# Patient Record
Sex: Female | Born: 1950 | Race: Black or African American | Hispanic: No | Smoking: Never smoker
Health system: Southern US, Community
[De-identification: ages and names within clinical notes are randomized; demographics above are authoritative.]

## PROBLEM LIST (undated history)

## (undated) ENCOUNTER — Emergency Department (HOSPITAL_COMMUNITY): Payer: 59

## (undated) DIAGNOSIS — T7840XA Allergy, unspecified, initial encounter: Secondary | ICD-10-CM

## (undated) DIAGNOSIS — M1990 Unspecified osteoarthritis, unspecified site: Secondary | ICD-10-CM

## (undated) DIAGNOSIS — E119 Type 2 diabetes mellitus without complications: Secondary | ICD-10-CM

## (undated) DIAGNOSIS — R112 Nausea with vomiting, unspecified: Secondary | ICD-10-CM

## (undated) DIAGNOSIS — I10 Essential (primary) hypertension: Secondary | ICD-10-CM

## (undated) DIAGNOSIS — E785 Hyperlipidemia, unspecified: Secondary | ICD-10-CM

## (undated) DIAGNOSIS — Z9889 Other specified postprocedural states: Secondary | ICD-10-CM

## (undated) HISTORY — PX: MULTIPLE TOOTH EXTRACTIONS: SHX2053

## (undated) HISTORY — DX: Allergy, unspecified, initial encounter: T78.40XA

## (undated) HISTORY — PX: BACK SURGERY: SHX140

## (undated) HISTORY — DX: Nausea with vomiting, unspecified: R11.2

## (undated) HISTORY — DX: Other specified postprocedural states: Z98.890

## (undated) HISTORY — DX: Hyperlipidemia, unspecified: E78.5

## (undated) HISTORY — DX: Type 2 diabetes mellitus without complications: E11.9

---

## 2018-12-16 ENCOUNTER — Telehealth (HOSPITAL_COMMUNITY): Payer: Self-pay | Admitting: *Deleted

## 2018-12-16 NOTE — Telephone Encounter (Signed)
12/16/18 left msg asking pt to return my call to schedule appt order from Dr. Weldon Inches

## 2018-12-20 ENCOUNTER — Other Ambulatory Visit: Payer: Self-pay | Admitting: Internal Medicine

## 2018-12-20 DIAGNOSIS — Z1231 Encounter for screening mammogram for malignant neoplasm of breast: Secondary | ICD-10-CM

## 2018-12-22 ENCOUNTER — Other Ambulatory Visit (HOSPITAL_COMMUNITY): Payer: Self-pay | Admitting: Internal Medicine

## 2018-12-22 ENCOUNTER — Encounter: Payer: Self-pay | Admitting: Family

## 2018-12-22 ENCOUNTER — Ambulatory Visit (HOSPITAL_COMMUNITY): Admission: RE | Admit: 2018-12-22 | Payer: Medicare Other | Source: Ambulatory Visit

## 2018-12-22 DIAGNOSIS — I739 Peripheral vascular disease, unspecified: Secondary | ICD-10-CM

## 2018-12-26 ENCOUNTER — Ambulatory Visit (HOSPITAL_COMMUNITY)
Admission: RE | Admit: 2018-12-26 | Discharge: 2018-12-26 | Disposition: A | Discharge status: Home / Self Care | Payer: Medicare Other | Source: Ambulatory Visit | Attending: Internal Medicine | Admitting: Internal Medicine

## 2018-12-26 DIAGNOSIS — I739 Peripheral vascular disease, unspecified: Secondary | ICD-10-CM | POA: Diagnosis not present

## 2019-01-04 ENCOUNTER — Other Ambulatory Visit: Payer: Self-pay | Admitting: Orthopedic Surgery

## 2019-01-04 DIAGNOSIS — M545 Low back pain, unspecified: Secondary | ICD-10-CM

## 2019-01-13 ENCOUNTER — Ambulatory Visit
Admission: RE | Admit: 2019-01-13 | Discharge: 2019-01-13 | Disposition: A | Discharge status: Home / Self Care | Payer: Medicare Other | Source: Ambulatory Visit | Attending: Orthopedic Surgery | Admitting: Orthopedic Surgery

## 2019-01-13 DIAGNOSIS — M545 Low back pain, unspecified: Secondary | ICD-10-CM

## 2019-01-16 ENCOUNTER — Other Ambulatory Visit: Payer: Medicare Other

## 2019-01-30 ENCOUNTER — Other Ambulatory Visit: Payer: Self-pay | Admitting: Neurosurgery

## 2019-01-30 DIAGNOSIS — M4310 Spondylolisthesis, site unspecified: Secondary | ICD-10-CM

## 2019-02-02 ENCOUNTER — Ambulatory Visit
Admission: RE | Admit: 2019-02-02 | Discharge: 2019-02-02 | Disposition: A | Discharge status: Home / Self Care | Payer: Medicare Other | Source: Ambulatory Visit | Attending: Neurosurgery | Admitting: Neurosurgery

## 2019-02-02 DIAGNOSIS — M4310 Spondylolisthesis, site unspecified: Secondary | ICD-10-CM

## 2019-03-16 ENCOUNTER — Other Ambulatory Visit: Payer: Self-pay | Admitting: Internal Medicine

## 2019-03-16 DIAGNOSIS — E2839 Other primary ovarian failure: Secondary | ICD-10-CM

## 2019-05-25 ENCOUNTER — Other Ambulatory Visit: Payer: Self-pay | Admitting: Neurosurgery

## 2019-06-01 ENCOUNTER — Ambulatory Visit
Admission: RE | Admit: 2019-06-01 | Discharge: 2019-06-01 | Disposition: A | Discharge status: Home / Self Care | Payer: Medicare Other | Source: Ambulatory Visit | Attending: Internal Medicine | Admitting: Internal Medicine

## 2019-06-01 ENCOUNTER — Other Ambulatory Visit: Payer: Self-pay

## 2019-06-01 DIAGNOSIS — E2839 Other primary ovarian failure: Secondary | ICD-10-CM

## 2019-06-07 ENCOUNTER — Other Ambulatory Visit: Payer: Self-pay

## 2019-06-07 ENCOUNTER — Encounter (HOSPITAL_COMMUNITY)
Admission: RE | Admit: 2019-06-07 | Discharge: 2019-06-07 | Disposition: A | Discharge status: Home / Self Care | Payer: Medicare Other | Source: Ambulatory Visit | Attending: Neurosurgery | Admitting: Neurosurgery

## 2019-06-07 ENCOUNTER — Encounter (HOSPITAL_COMMUNITY): Payer: Self-pay

## 2019-06-07 DIAGNOSIS — M48061 Spinal stenosis, lumbar region without neurogenic claudication: Secondary | ICD-10-CM | POA: Diagnosis not present

## 2019-06-07 DIAGNOSIS — I10 Essential (primary) hypertension: Secondary | ICD-10-CM | POA: Diagnosis not present

## 2019-06-07 DIAGNOSIS — E669 Obesity, unspecified: Secondary | ICD-10-CM | POA: Diagnosis not present

## 2019-06-07 DIAGNOSIS — Z6833 Body mass index (BMI) 33.0-33.9, adult: Secondary | ICD-10-CM | POA: Diagnosis not present

## 2019-06-07 DIAGNOSIS — Z7951 Long term (current) use of inhaled steroids: Secondary | ICD-10-CM | POA: Insufficient documentation

## 2019-06-07 DIAGNOSIS — Z01818 Encounter for other preprocedural examination: Secondary | ICD-10-CM | POA: Insufficient documentation

## 2019-06-07 DIAGNOSIS — M4316 Spondylolisthesis, lumbar region: Secondary | ICD-10-CM | POA: Diagnosis not present

## 2019-06-07 DIAGNOSIS — Z79899 Other long term (current) drug therapy: Secondary | ICD-10-CM | POA: Insufficient documentation

## 2019-06-07 DIAGNOSIS — Z1159 Encounter for screening for other viral diseases: Secondary | ICD-10-CM | POA: Insufficient documentation

## 2019-06-07 DIAGNOSIS — Z791 Long term (current) use of non-steroidal anti-inflammatories (NSAID): Secondary | ICD-10-CM | POA: Insufficient documentation

## 2019-06-07 DIAGNOSIS — R9431 Abnormal electrocardiogram [ECG] [EKG]: Secondary | ICD-10-CM | POA: Insufficient documentation

## 2019-06-07 HISTORY — DX: Unspecified osteoarthritis, unspecified site: M19.90

## 2019-06-07 HISTORY — DX: Essential (primary) hypertension: I10

## 2019-06-07 LAB — CBC WITH DIFFERENTIAL/PLATELET
Abs Immature Granulocytes: 0.01 10*3/uL (ref 0.00–0.07)
Basophils Absolute: 0 10*3/uL (ref 0.0–0.1)
Basophils Relative: 0 %
Eosinophils Absolute: 0.2 10*3/uL (ref 0.0–0.5)
Eosinophils Relative: 3 %
HCT: 37.5 % (ref 36.0–46.0)
Hemoglobin: 11.6 g/dL — ABNORMAL LOW (ref 12.0–15.0)
Immature Granulocytes: 0 %
Lymphocytes Relative: 51 %
Lymphs Abs: 2.8 10*3/uL (ref 0.7–4.0)
MCH: 23.5 pg — ABNORMAL LOW (ref 26.0–34.0)
MCHC: 30.9 g/dL (ref 30.0–36.0)
MCV: 75.9 fL — ABNORMAL LOW (ref 80.0–100.0)
Monocytes Absolute: 0.4 10*3/uL (ref 0.1–1.0)
Monocytes Relative: 8 %
Neutro Abs: 2.1 10*3/uL (ref 1.7–7.7)
Neutrophils Relative %: 38 %
Platelets: 308 10*3/uL (ref 150–400)
RBC: 4.94 MIL/uL (ref 3.87–5.11)
RDW: 15 % (ref 11.5–15.5)
WBC: 5.5 10*3/uL (ref 4.0–10.5)
nRBC: 0 % (ref 0.0–0.2)

## 2019-06-07 LAB — BASIC METABOLIC PANEL
Anion gap: 8 (ref 5–15)
BUN: 11 mg/dL (ref 8–23)
CO2: 26 mmol/L (ref 22–32)
Calcium: 9.1 mg/dL (ref 8.9–10.3)
Chloride: 108 mmol/L (ref 98–111)
Creatinine, Ser: 0.62 mg/dL (ref 0.44–1.00)
GFR calc Af Amer: >60 mL/min (ref >60)
GFR calc non Af Amer: >60 mL/min (ref >60)
Glucose, Bld: 136 mg/dL — ABNORMAL HIGH (ref 70–99)
Potassium: 3.8 mmol/L (ref 3.5–5.1)
Sodium: 142 mmol/L (ref 135–145)

## 2019-06-07 LAB — TYPE AND SCREEN
ABO/RH(D): O POS
Antibody Screen: NEGATIVE

## 2019-06-07 LAB — SURGICAL PCR SCREEN
MRSA, PCR: NEGATIVE
Staphylococcus aureus: POSITIVE — AB

## 2019-06-07 LAB — ABO/RH: ABO/RH(D): O POS

## 2019-06-07 NOTE — Progress Notes (Signed)
PCP - Dr. Kelby Fam - [facility] Cardiologist - denies  Chest x-ray - not needed EKG - 06/07/19 Stress Test - > 10 years ECHO - > 10 years Cardiac Cath - denies  Anesthesia review: records requested  Patient denies shortness of breath, fever, cough and chest pain at PAT appointment   Patient verbalized understanding of instructions that were given to them at the PAT appointment. Patient was also instructed that they will need to review over the PAT instructions again at home before surgery.

## 2019-06-07 NOTE — Progress Notes (Addendum)
Patient's Surgical PCR was positive for Staph.  Called in Mupirocin to [place], spoke with Merrilee Seashore.  Called patient, informed her of results and rx being called in.  Use twice day, nasally for five days.  Patient verbalized understanding.

## 2019-06-07 NOTE — Progress Notes (Signed)
[facility] [phone] - [place], [place] - [address] AT [address] [place] [address] Phone: [phone] Fax: [phone]      Your procedure is scheduled on June 29  Report to [facility] Main Entrance "A" at 0600 A.M., and check in at the Admitting office.  Call this number if you have problems the morning of surgery:  [phone]  Call [phone] if you have any questions prior to your surgery date Monday-Friday 8am-4pm    Remember:  Do not eat or drink after midnight.   Take these medicines the morning of surgery with A SIP OF WATER  amLODipine (NORVASC)  cloNIDine (CATAPRES) fluticasone (FLONASE) HYDROcodone-acetaminophen (NORCO/VICODIN) if needed for pain lovastatin (MEVACOR) methocarbamol (ROBAXIN)   7 days prior to surgery STOP taking any diclofenac (VOLTAREN), Aspirin (unless otherwise instructed by your surgeon), Aleve, Naproxen, Ibuprofen, Motrin, Advil, Goody's, BC's, all herbal medications, fish oil, and all vitamins.    The Morning of Surgery  Do not wear jewelry, make-up or nail polish.  Do not wear lotions, powders, or perfumes/colognes, or deodorant  Do not shave 48 hours prior to surgery.  Men may shave face and neck.  Do not bring valuables to the hospital.  [facility] is not responsible for any belongings or valuables.  If you are a smoker, DO NOT Smoke 24 hours prior to surgery IF you wear a CPAP at night please bring your mask, tubing, and machine the morning of surgery   Remember that you must have someone to transport you home after your surgery, and remain with you for 24 hours if you are discharged the same day.   Contacts, glasses, hearing aids, dentures or bridgework may not be worn into surgery.    Leave your suitcase in the car.  After surgery it may be brought to your room.  For patients admitted to the hospital, discharge time will be determined by your treatment  team.  Patients discharged the day of surgery will not be allowed to drive home.    Special instructions:   [facility]- Preparing For Surgery  Before surgery, you can play an important role. Because skin is not sterile, your skin needs to be as free of germs as possible. You can reduce the number of germs on your skin by washing with CHG (chlorahexidine gluconate) Soap before surgery.  CHG is an antiseptic cleaner which kills germs and bonds with the skin to continue killing germs even after washing.    Oral Hygiene is also important to reduce your risk of infection.  Remember - BRUSH YOUR TEETH THE MORNING OF SURGERY WITH YOUR REGULAR TOOTHPASTE  Please do not use if you have an allergy to CHG or antibacterial soaps. If your skin becomes reddened/irritated stop using the CHG.  Do not shave (including legs and underarms) for at least 48 hours prior to first CHG shower. It is OK to shave your face.  Please follow these instructions carefully.   1. Shower the NIGHT BEFORE SURGERY and the MORNING OF SURGERY with CHG Soap.   2. If you chose to wash your hair, wash your hair first as usual with your normal shampoo.  3. After you shampoo, rinse your hair and body thoroughly to remove the shampoo.  4. Use CHG as you would any other liquid soap. You can apply CHG directly to the skin and wash gently with a scrungie or a clean washcloth.   5. Apply the CHG Soap to  your body ONLY FROM THE NECK DOWN.  Do not use on open wounds or open sores. Avoid contact with your eyes, ears, mouth and genitals (private parts). Wash Face and genitals (private parts)  with your normal soap.   6. Wash thoroughly, paying special attention to the area where your surgery will be performed.  7. Thoroughly rinse your body with warm water from the neck down.  8. DO NOT shower/wash with your normal soap after using and rinsing off the CHG Soap.  9. Pat yourself dry with a CLEAN TOWEL.  10. Wear CLEAN PAJAMAS to bed  the night before surgery, wear comfortable clothes the morning of surgery  11. Place CLEAN SHEETS on your bed the night of your first shower and DO NOT SLEEP WITH PETS.    Day of Surgery:  Do not apply any deodorants/lotions.  Please wear clean clothes to the hospital/surgery center.   Remember to brush your teeth WITH YOUR REGULAR TOOTHPASTE.   Please read over the following fact sheets that you were given.

## 2019-06-08 ENCOUNTER — Other Ambulatory Visit (HOSPITAL_COMMUNITY)
Admission: RE | Admit: 2019-06-08 | Discharge: 2019-06-08 | Disposition: A | Discharge status: Home / Self Care | Payer: Medicare Other | Source: Ambulatory Visit | Attending: Neurosurgery | Admitting: Neurosurgery

## 2019-06-08 DIAGNOSIS — Z01818 Encounter for other preprocedural examination: Secondary | ICD-10-CM | POA: Diagnosis not present

## 2019-06-08 LAB — SARS CORONAVIRUS 2 (TAT 6-24 HRS): SARS Coronavirus 2: NEGATIVE

## 2019-06-08 NOTE — Progress Notes (Addendum)
Anesthesia Chart Review:  Case: [id number] Date/Time: 06/12/19 0745   Procedure: PLIF - L2-L3 - L3-L4 - Posterior Lateral and Interbody fusion (N/A Back)   Anesthesia type: General   Pre-op diagnosis: Spondylolisthesis   Location: [facility] OR ROOM 20 / [facility] OR   Surgeon: Julio SicksPool, Henry, MD      DISCUSSION: Patient is a 68 year old female scheduled for the above procedure.  History includes never smoker, HTN, arthritis, back surgery (L4-5 fusion). BMI is consistent with obesity.  No reported CAD or DM history. Non-smoker. She denied SOB, chest pain, cough, fever at her PAT RN visit. Latest records requested from her PCP office [employer]([facility]). If pertinent records received prior to surgery then I will plan to update my note, but based on currently available information, I would anticipate that she can proceed as planned if no acute changes.   Presurgical COVID test is scheduled for 06/08/19. (UPDATE 06/09/19 9:30 AM: COVID test negative. No prior EKG at [facility]. Seen by Dr. Concepcion ElkAvbuere on 03/16/19 and was waiting on surgery date at that time.)   VS: BP 137/62   Pulse 83   Temp (!) 36.3 C   Resp 20   Ht 5\' 2"  (1.575 m)   Wt 83.6 kg   SpO2 100%   BMI 33.71 kg/m   PROVIDERS: Mayra NeerGoode, Pandora, PA-C is PCP with Fleet ContrasAvbuere, Edwin, MD at [facility].   LABS: Labs reviewed: Acceptable for surgery. (all labs ordered are listed, but only abnormal results are displayed)  Labs Reviewed  SURGICAL PCR SCREEN - Abnormal; Notable for the following components:      Result Value   Staphylococcus aureus POSITIVE (*)    All other components within normal limits  CBC WITH DIFFERENTIAL/PLATELET - Abnormal; Notable for the following components:   Hemoglobin 11.6 (*)    MCV 75.9 (*)    MCH 23.5 (*)    All other components within normal limits  BASIC METABOLIC PANEL - Abnormal; Notable for the following components:   Glucose, Bld 136 (*)    All other components within normal limits  TYPE AND  SCREEN  ABO/RH    IMAGES: CT L-spine 02/02/19: IMPRESSION: 1. L4-5 PLIF, posterior element fusion, and laminectomy without hardware complication. 2. Stable lumbar spondylosis given differences in technique greatest at the L3-4 level with there is adjacent segment disease resulting in severe spinal canal stenosis.  MRI L-spine 01/13/19: IMPRESSION: 1. Prior posterior decompression with fusion at L4-5 without residual stenosis. 2. Adjacent segment multifactorial disease at L3-4 with resultant severe canal with moderate left greater than right L3 foraminal stenosis. 3. Disc bulging with superimposed broad-based left subarticular/foraminal disc protrusion and facet hypertrophy at L2-3, resulting in moderate canal with severe left lateral recess stenosis and moderate left foraminal narrowing. Either the left L2 or descending L3 nerve roots could be affected. 4. Right eccentric disc osteophyte at L5-S1 with resultant mild to moderate right L5 foraminal stenosis.   EKG: 06/07/19: Normal sinus rhythm Septal infarct , age undetermined Abnormal ECG No previous tracing Confirmed by Nanetta BattyBerry, Jonathan [phone]([id number]) on 06/07/2019 5:16:47 PM - Requested prior EKG from [facility] if available, but records are pending. Currently no comparison tracing available.   CV: She reported a stress and echo > 10 years ago.   She has BLE ABIs within the normal range on 12/16/18.   Past Medical History:  Diagnosis Date  . Arthritis   . Hypertension     Past Surgical History:  Procedure Laterality Date  .  ABDOMINAL HYSTERECTOMY    . BACK SURGERY     lower back  . MULTIPLE TOOTH EXTRACTIONS      MEDICATIONS: . amLODipine (NORVASC) 10 MG tablet  . Calcium Carb-Cholecalciferol (CALCIUM 600/VITAMIN D3 PO)  . cloNIDine (CATAPRES) 0.1 MG tablet  . diclofenac (VOLTAREN) 75 MG EC tablet  . fluticasone (FLONASE) 50 MCG/ACT nasal spray  . furosemide (LASIX) 20 MG tablet  . HYDROcodone-acetaminophen  (NORCO/VICODIN) 5-325 MG tablet  . losartan (COZAAR) 100 MG tablet  . lovastatin (MEVACOR) 40 MG tablet  . methocarbamol (ROBAXIN) 750 MG tablet  . potassium chloride (K-DUR) 10 MEQ tablet   No current facility-administered medications for this encounter.     Myra Gianotti, PA-C Surgical Short Stay/Anesthesiology [facility] Phone [phone] [facility] Phone [phone] 06/08/2019 11:58 AM

## 2019-06-11 NOTE — Anesthesia Preprocedure Evaluation (Addendum)
Anesthesia Evaluation  Patient identified by MRN, date of birth, ID band Patient awake    Reviewed: Allergy & Precautions, H&P , NPO status , Patient's Chart, lab work & pertinent test results  Airway Mallampati: II  TM Distance: >3 FB Neck ROM: Full    Dental no notable dental hx. (+) Dental Advisory Given, Edentulous Upper, Edentulous Lower   Pulmonary neg pulmonary ROS,    Pulmonary exam normal breath sounds clear to auscultation       Cardiovascular Exercise Tolerance: Good hypertension, Pt. on medications  Rhythm:Regular Rate:Normal     Neuro/Psych negative neurological ROS  negative psych ROS   GI/Hepatic negative GI ROS, Neg liver ROS,   Endo/Other  negative endocrine ROS  Renal/GU negative Renal ROS  negative genitourinary   Musculoskeletal  (+) Arthritis , Osteoarthritis,    Abdominal   Peds  Hematology negative hematology ROS (+)   Anesthesia Other Findings   Reproductive/Obstetrics negative OB ROS                            Anesthesia Physical Anesthesia Plan  ASA: II  Anesthesia Plan: General   Post-op Pain Management:    Induction: Intravenous  PONV Risk Score and Plan: 4 or greater and Ondansetron, Dexamethasone and Midazolam  Airway Management Planned: Oral ETT  Additional Equipment:   Intra-op Plan:   Post-operative Plan: Extubation in OR  Informed Consent: I have reviewed the patients History and Physical, chart, labs and discussed the procedure including the risks, benefits and alternatives for the proposed anesthesia with the patient or authorized representative who has indicated his/her understanding and acceptance.     Dental advisory given  Plan Discussed with: CRNA  Anesthesia Plan Comments:        Anesthesia Quick Evaluation

## 2019-06-12 ENCOUNTER — Inpatient Hospital Stay (HOSPITAL_COMMUNITY): Payer: Medicare Other

## 2019-06-12 ENCOUNTER — Inpatient Hospital Stay (HOSPITAL_COMMUNITY)
Admission: RE | Disposition: A | Discharge status: Skilled Nursing Facility | Payer: Self-pay | Source: Home / Self Care | Attending: Neurosurgery

## 2019-06-12 ENCOUNTER — Inpatient Hospital Stay (HOSPITAL_COMMUNITY): Payer: Medicare Other | Admitting: Vascular Surgery

## 2019-06-12 ENCOUNTER — Inpatient Hospital Stay (HOSPITAL_COMMUNITY): Payer: Medicare Other | Admitting: Certified Registered"

## 2019-06-12 ENCOUNTER — Encounter (HOSPITAL_COMMUNITY): Payer: Self-pay | Admitting: Certified Registered"

## 2019-06-12 ENCOUNTER — Other Ambulatory Visit: Payer: Self-pay

## 2019-06-12 ENCOUNTER — Inpatient Hospital Stay (HOSPITAL_COMMUNITY)
Admission: RE | Admit: 2019-06-12 | Discharge: 2019-06-20 | DRG: 454 | Disposition: A | Discharge status: Skilled Nursing Facility | Payer: Medicare Other | Attending: Neurosurgery | Admitting: Neurosurgery

## 2019-06-12 DIAGNOSIS — Z886 Allergy status to analgesic agent status: Secondary | ICD-10-CM | POA: Diagnosis not present

## 2019-06-12 DIAGNOSIS — Z419 Encounter for procedure for purposes other than remedying health state, unspecified: Secondary | ICD-10-CM

## 2019-06-12 DIAGNOSIS — Z1159 Encounter for screening for other viral diseases: Secondary | ICD-10-CM

## 2019-06-12 DIAGNOSIS — G992 Myelopathy in diseases classified elsewhere: Secondary | ICD-10-CM | POA: Diagnosis not present

## 2019-06-12 DIAGNOSIS — M4316 Spondylolisthesis, lumbar region: Secondary | ICD-10-CM | POA: Diagnosis not present

## 2019-06-12 DIAGNOSIS — Z79899 Other long term (current) drug therapy: Secondary | ICD-10-CM | POA: Diagnosis not present

## 2019-06-12 DIAGNOSIS — M4310 Spondylolisthesis, site unspecified: Secondary | ICD-10-CM | POA: Diagnosis present

## 2019-06-12 DIAGNOSIS — I10 Essential (primary) hypertension: Secondary | ICD-10-CM | POA: Diagnosis present

## 2019-06-12 DIAGNOSIS — Z880 Allergy status to penicillin: Secondary | ICD-10-CM

## 2019-06-12 DIAGNOSIS — M5416 Radiculopathy, lumbar region: Secondary | ICD-10-CM | POA: Diagnosis present

## 2019-06-12 DIAGNOSIS — Z720 Tobacco use: Secondary | ICD-10-CM | POA: Diagnosis not present

## 2019-06-12 DIAGNOSIS — M48062 Spinal stenosis, lumbar region with neurogenic claudication: Secondary | ICD-10-CM | POA: Diagnosis present

## 2019-06-12 SURGERY — POSTERIOR LUMBAR FUSION 2 LEVEL
Anesthesia: General | Site: Back

## 2019-06-12 MED ORDER — THROMBIN 20000 UNITS EX SOLR
CUTANEOUS | Status: AC
  Filled 2019-06-12: qty 20000

## 2019-06-12 MED ORDER — SUCCINYLCHOLINE CHLORIDE 200 MG/10ML IV SOSY
PREFILLED_SYRINGE | INTRAVENOUS | Status: AC
  Filled 2019-06-12: qty 10

## 2019-06-12 MED ORDER — SUGAMMADEX SODIUM 200 MG/2ML IV SOLN
INTRAVENOUS | Status: DC | PRN
  Administered 2019-06-12: 200 mg via INTRAVENOUS

## 2019-06-12 MED ORDER — FLUTICASONE PROPIONATE 50 MCG/ACT NA SUSP
1.0000 | Freq: Every day | NASAL | Status: DC | PRN
  Filled 2019-06-12: qty 16

## 2019-06-12 MED ORDER — HYDROCODONE-ACETAMINOPHEN 10-325 MG PO TABS
ORAL_TABLET | ORAL | Status: AC
  Filled 2019-06-12: qty 1

## 2019-06-12 MED ORDER — SODIUM CHLORIDE 0.9% FLUSH
3.0000 mL | Freq: Two times a day (BID) | INTRAVENOUS | Status: DC
  Administered 2019-06-13 – 2019-06-18 (×9): 3 mL via INTRAVENOUS

## 2019-06-12 MED ORDER — SUCCINYLCHOLINE CHLORIDE 200 MG/10ML IV SOSY
PREFILLED_SYRINGE | INTRAVENOUS | Status: DC | PRN
  Administered 2019-06-12: 100 mg via INTRAVENOUS

## 2019-06-12 MED ORDER — MUPIROCIN 2 % EX OINT
1.0000 "application " | TOPICAL_OINTMENT | Freq: Two times a day (BID) | CUTANEOUS | Status: AC
  Administered 2019-06-12 – 2019-06-17 (×9): 1 via NASAL
  Filled 2019-06-12: qty 22

## 2019-06-12 MED ORDER — DIAZEPAM 5 MG PO TABS
5.0000 mg | ORAL_TABLET | Freq: Four times a day (QID) | ORAL | Status: DC | PRN
  Administered 2019-06-12: 5 mg via ORAL

## 2019-06-12 MED ORDER — LIDOCAINE 2% (20 MG/ML) 5 ML SYRINGE
INTRAMUSCULAR | Status: DC | PRN
  Administered 2019-06-12: 100 mg via INTRAVENOUS

## 2019-06-12 MED ORDER — SODIUM CHLORIDE 0.9 % IV SOLN
INTRAVENOUS | Status: DC | PRN
  Administered 2019-06-12: 60 ug/min via INTRAVENOUS

## 2019-06-12 MED ORDER — ACETAMINOPHEN 650 MG RE SUPP: 650.0000 mg | RECTAL | Status: DC | PRN

## 2019-06-12 MED ORDER — DEXAMETHASONE SODIUM PHOSPHATE 10 MG/ML IJ SOLN
INTRAMUSCULAR | Status: AC
  Filled 2019-06-12: qty 1

## 2019-06-12 MED ORDER — DEXAMETHASONE SODIUM PHOSPHATE 10 MG/ML IJ SOLN
10.0000 mg | INTRAMUSCULAR | Status: DC
  Filled 2019-06-12: qty 1

## 2019-06-12 MED ORDER — MIDAZOLAM HCL 2 MG/2ML IJ SOLN
INTRAMUSCULAR | Status: AC
  Filled 2019-06-12: qty 2

## 2019-06-12 MED ORDER — DIAZEPAM 5 MG PO TABS
ORAL_TABLET | ORAL | Status: AC
  Filled 2019-06-12: qty 1

## 2019-06-12 MED ORDER — DIAZEPAM 5 MG PO TABS
5.0000 mg | ORAL_TABLET | Freq: Four times a day (QID) | ORAL | Status: DC | PRN
  Administered 2019-06-12: 10 mg via ORAL
  Administered 2019-06-13 – 2019-06-14 (×3): 5 mg via ORAL
  Administered 2019-06-17 – 2019-06-18 (×2): 10 mg via ORAL
  Administered 2019-06-18 – 2019-06-20 (×3): 5 mg via ORAL
  Filled 2019-06-12 (×4): qty 1
  Filled 2019-06-12: qty 2
  Filled 2019-06-12 (×2): qty 1
  Filled 2019-06-12: qty 2
  Filled 2019-06-12: qty 1
  Filled 2019-06-12: qty 2

## 2019-06-12 MED ORDER — HYDROMORPHONE HCL 1 MG/ML IJ SOLN
0.2500 mg | INTRAMUSCULAR | Status: DC | PRN
  Administered 2019-06-12 (×4): 0.5 mg via INTRAVENOUS

## 2019-06-12 MED ORDER — CHLORHEXIDINE GLUCONATE CLOTH 2 % EX PADS: 6.0000 | MEDICATED_PAD | Freq: Once | CUTANEOUS | Status: DC

## 2019-06-12 MED ORDER — VANCOMYCIN HCL IN DEXTROSE 1-5 GM/200ML-% IV SOLN
1000.0000 mg | Freq: Once | INTRAVENOUS | Status: AC
  Administered 2019-06-12: 1000 mg via INTRAVENOUS
  Filled 2019-06-12: qty 200

## 2019-06-12 MED ORDER — ROCURONIUM BROMIDE 50 MG/5ML IV SOSY
PREFILLED_SYRINGE | INTRAVENOUS | Status: DC | PRN
  Administered 2019-06-12: 20 mg via INTRAVENOUS
  Administered 2019-06-12: 50 mg via INTRAVENOUS
  Administered 2019-06-12 (×3): 20 mg via INTRAVENOUS

## 2019-06-12 MED ORDER — POTASSIUM CHLORIDE CRYS ER 20 MEQ PO TBCR
20.0000 meq | EXTENDED_RELEASE_TABLET | Freq: Every day | ORAL | Status: DC
  Administered 2019-06-13 – 2019-06-20 (×8): 20 meq via ORAL
  Filled 2019-06-12 (×8): qty 1

## 2019-06-12 MED ORDER — BUPIVACAINE HCL (PF) 0.25 % IJ SOLN
INTRAMUSCULAR | Status: DC | PRN
  Administered 2019-06-12: 20 mL

## 2019-06-12 MED ORDER — GLYCOPYRROLATE PF 0.2 MG/ML IJ SOSY
PREFILLED_SYRINGE | INTRAMUSCULAR | Status: DC | PRN
  Administered 2019-06-12: .1 mg via INTRAVENOUS

## 2019-06-12 MED ORDER — HYDROCODONE-ACETAMINOPHEN 10-325 MG PO TABS
1.0000 | ORAL_TABLET | ORAL | Status: DC | PRN
  Administered 2019-06-12: 1 via ORAL

## 2019-06-12 MED ORDER — ALBUMIN HUMAN 5 % IV SOLN
INTRAVENOUS | Status: DC | PRN
  Administered 2019-06-12 (×2): via INTRAVENOUS

## 2019-06-12 MED ORDER — THROMBIN 20000 UNITS EX SOLR
CUTANEOUS | Status: DC | PRN
  Administered 2019-06-12 (×2): 20 mL via TOPICAL

## 2019-06-12 MED ORDER — MIDAZOLAM HCL 5 MG/5ML IJ SOLN
INTRAMUSCULAR | Status: DC | PRN
  Administered 2019-06-12: 2 mg via INTRAVENOUS

## 2019-06-12 MED ORDER — ONDANSETRON HCL 4 MG/2ML IJ SOLN
INTRAMUSCULAR | Status: AC
  Filled 2019-06-12: qty 2

## 2019-06-12 MED ORDER — OXYCODONE HCL 5 MG PO TABS: 10.0000 mg | ORAL_TABLET | ORAL | Status: DC | PRN

## 2019-06-12 MED ORDER — PHENOL 1.4 % MT LIQD: 1.0000 | OROMUCOSAL | Status: DC | PRN

## 2019-06-12 MED ORDER — BUPIVACAINE HCL (PF) 0.25 % IJ SOLN
INTRAMUSCULAR | Status: AC
  Filled 2019-06-12: qty 30

## 2019-06-12 MED ORDER — SODIUM CHLORIDE 0.9 % IV SOLN
INTRAVENOUS | Status: DC | PRN
  Administered 2019-06-12: 12:00:00 via INTRAVENOUS

## 2019-06-12 MED ORDER — AMLODIPINE BESYLATE 10 MG PO TABS
10.0000 mg | ORAL_TABLET | Freq: Every day | ORAL | Status: DC
  Administered 2019-06-13 – 2019-06-20 (×8): 10 mg via ORAL
  Filled 2019-06-12: qty 2
  Filled 2019-06-12 (×6): qty 1
  Filled 2019-06-12: qty 2

## 2019-06-12 MED ORDER — PRAVASTATIN SODIUM 40 MG PO TABS
40.0000 mg | ORAL_TABLET | Freq: Every day | ORAL | Status: DC
  Administered 2019-06-12 – 2019-06-19 (×8): 40 mg via ORAL
  Filled 2019-06-12 (×8): qty 1

## 2019-06-12 MED ORDER — HYDROCODONE-ACETAMINOPHEN 10-325 MG PO TABS
1.0000 | ORAL_TABLET | ORAL | Status: DC | PRN
  Administered 2019-06-12 – 2019-06-20 (×12): 1 via ORAL
  Filled 2019-06-12 (×12): qty 1

## 2019-06-12 MED ORDER — ONDANSETRON HCL 4 MG PO TABS: 4.0000 mg | ORAL_TABLET | Freq: Four times a day (QID) | ORAL | Status: DC | PRN

## 2019-06-12 MED ORDER — LACTATED RINGERS IV SOLN
INTRAVENOUS | Status: DC | PRN
  Administered 2019-06-12 (×2): via INTRAVENOUS

## 2019-06-12 MED ORDER — VANCOMYCIN HCL IN DEXTROSE 1-5 GM/200ML-% IV SOLN
1000.0000 mg | Freq: Once | INTRAVENOUS | Status: AC
  Administered 2019-06-12: 1000 mg via INTRAVENOUS

## 2019-06-12 MED ORDER — 0.9 % SODIUM CHLORIDE (POUR BTL) OPTIME
TOPICAL | Status: DC | PRN
  Administered 2019-06-12: 1000 mL

## 2019-06-12 MED ORDER — GLYCOPYRROLATE PF 0.2 MG/ML IJ SOSY
PREFILLED_SYRINGE | INTRAMUSCULAR | Status: AC
  Filled 2019-06-12: qty 1

## 2019-06-12 MED ORDER — POLYETHYLENE GLYCOL 3350 17 G PO PACK
17.0000 g | PACK | Freq: Every day | ORAL | Status: DC | PRN
  Administered 2019-06-15 – 2019-06-19 (×3): 17 g via ORAL
  Filled 2019-06-12 (×3): qty 1

## 2019-06-12 MED ORDER — ROCURONIUM BROMIDE 10 MG/ML (PF) SYRINGE
PREFILLED_SYRINGE | INTRAVENOUS | Status: AC
  Filled 2019-06-12: qty 10

## 2019-06-12 MED ORDER — MENTHOL 3 MG MT LOZG: 1.0000 | LOZENGE | OROMUCOSAL | Status: DC | PRN

## 2019-06-12 MED ORDER — LIDOCAINE 2% (20 MG/ML) 5 ML SYRINGE
INTRAMUSCULAR | Status: AC
  Filled 2019-06-12: qty 5

## 2019-06-12 MED ORDER — FENTANYL CITRATE (PF) 250 MCG/5ML IJ SOLN
INTRAMUSCULAR | Status: AC
  Filled 2019-06-12: qty 5

## 2019-06-12 MED ORDER — PHENYLEPHRINE 40 MCG/ML (10ML) SYRINGE FOR IV PUSH (FOR BLOOD PRESSURE SUPPORT)
PREFILLED_SYRINGE | INTRAVENOUS | Status: DC | PRN
  Administered 2019-06-12 (×2): 80 ug via INTRAVENOUS

## 2019-06-12 MED ORDER — HYDROMORPHONE HCL 1 MG/ML IJ SOLN
INTRAMUSCULAR | Status: AC
  Filled 2019-06-12: qty 1

## 2019-06-12 MED ORDER — HYDROXYZINE HCL 50 MG/ML IM SOLN
50.0000 mg | Freq: Four times a day (QID) | INTRAMUSCULAR | Status: DC | PRN
  Administered 2019-06-13: 50 mg via INTRAMUSCULAR
  Filled 2019-06-12 (×2): qty 1

## 2019-06-12 MED ORDER — ONDANSETRON HCL 4 MG/2ML IJ SOLN
4.0000 mg | Freq: Four times a day (QID) | INTRAMUSCULAR | Status: DC | PRN
  Administered 2019-06-12 – 2019-06-13 (×2): 4 mg via INTRAVENOUS
  Filled 2019-06-12 (×2): qty 2

## 2019-06-12 MED ORDER — CLONIDINE HCL 0.1 MG PO TABS
0.1000 mg | ORAL_TABLET | Freq: Two times a day (BID) | ORAL | Status: DC
  Administered 2019-06-12 – 2019-06-20 (×16): 0.1 mg via ORAL
  Filled 2019-06-12 (×16): qty 1

## 2019-06-12 MED ORDER — CALCIUM CARB-CHOLECALCIFEROL 600-800 MG-UNIT PO TABS: ORAL_TABLET | Freq: Every day | ORAL | Status: DC

## 2019-06-12 MED ORDER — FENTANYL CITRATE (PF) 100 MCG/2ML IJ SOLN
INTRAMUSCULAR | Status: DC | PRN
  Administered 2019-06-12: 100 ug via INTRAVENOUS
  Administered 2019-06-12 (×2): 50 ug via INTRAVENOUS
  Administered 2019-06-12: 100 ug via INTRAVENOUS
  Administered 2019-06-12 (×2): 50 ug via INTRAVENOUS
  Administered 2019-06-12: 100 ug via INTRAVENOUS

## 2019-06-12 MED ORDER — VANCOMYCIN HCL 1 G IV SOLR
INTRAVENOUS | Status: DC | PRN
  Administered 2019-06-12: 1000 mg via TOPICAL

## 2019-06-12 MED ORDER — FUROSEMIDE 20 MG PO TABS
20.0000 mg | ORAL_TABLET | Freq: Every day | ORAL | Status: DC
  Administered 2019-06-13 – 2019-06-20 (×8): 20 mg via ORAL
  Filled 2019-06-12 (×8): qty 1

## 2019-06-12 MED ORDER — SODIUM CHLORIDE 0.9 % IV SOLN: 250.0000 mL | INTRAVENOUS | Status: DC

## 2019-06-12 MED ORDER — ACETAMINOPHEN 325 MG PO TABS
650.0000 mg | ORAL_TABLET | ORAL | Status: DC | PRN
  Administered 2019-06-12 – 2019-06-19 (×4): 650 mg via ORAL
  Filled 2019-06-12 (×7): qty 2

## 2019-06-12 MED ORDER — VANCOMYCIN HCL 1000 MG IV SOLR
INTRAVENOUS | Status: AC
  Filled 2019-06-12: qty 1000

## 2019-06-12 MED ORDER — PROPOFOL 10 MG/ML IV BOLUS
INTRAVENOUS | Status: AC
  Filled 2019-06-12: qty 20

## 2019-06-12 MED ORDER — PROPOFOL 10 MG/ML IV BOLUS
INTRAVENOUS | Status: DC | PRN
  Administered 2019-06-12: 160 mg via INTRAVENOUS

## 2019-06-12 MED ORDER — ONDANSETRON HCL 4 MG/2ML IJ SOLN
INTRAMUSCULAR | Status: DC | PRN
  Administered 2019-06-12: 4 mg via INTRAVENOUS

## 2019-06-12 MED ORDER — OXYCODONE HCL 5 MG PO TABS
10.0000 mg | ORAL_TABLET | ORAL | Status: DC | PRN
  Administered 2019-06-12 – 2019-06-20 (×17): 10 mg via ORAL
  Filled 2019-06-12 (×17): qty 2

## 2019-06-12 MED ORDER — ACETAMINOPHEN 500 MG PO TABS
1000.0000 mg | ORAL_TABLET | Freq: Once | ORAL | Status: AC
  Administered 2019-06-12: 1000 mg via ORAL
  Filled 2019-06-12: qty 2

## 2019-06-12 MED ORDER — LOSARTAN POTASSIUM 50 MG PO TABS
100.0000 mg | ORAL_TABLET | Freq: Every day | ORAL | Status: DC
  Administered 2019-06-13 – 2019-06-20 (×8): 100 mg via ORAL
  Filled 2019-06-12 (×9): qty 2

## 2019-06-12 MED ORDER — PHENYLEPHRINE 40 MCG/ML (10ML) SYRINGE FOR IV PUSH (FOR BLOOD PRESSURE SUPPORT)
PREFILLED_SYRINGE | INTRAVENOUS | Status: AC
  Filled 2019-06-12: qty 10

## 2019-06-12 MED ORDER — HYDROMORPHONE HCL 1 MG/ML IJ SOLN
1.0000 mg | INTRAMUSCULAR | Status: DC | PRN
  Administered 2019-06-12 (×2): 1 mg via INTRAVENOUS
  Filled 2019-06-12 (×4): qty 1

## 2019-06-12 MED ORDER — BISACODYL 10 MG RE SUPP
10.0000 mg | Freq: Every day | RECTAL | Status: DC | PRN
  Administered 2019-06-19: 15:00:00 10 mg via RECTAL
  Filled 2019-06-12: qty 1

## 2019-06-12 MED ORDER — FLEET ENEMA 7-19 GM/118ML RE ENEM: 1.0000 | ENEMA | Freq: Once | RECTAL | Status: DC | PRN

## 2019-06-12 MED ORDER — SODIUM CHLORIDE 0.9 % IV SOLN
INTRAVENOUS | Status: DC | PRN
  Administered 2019-06-12: 09:00:00 500 mL

## 2019-06-12 MED ORDER — THROMBIN 5000 UNITS EX SOLR
CUTANEOUS | Status: AC
  Filled 2019-06-12: qty 5000

## 2019-06-12 MED ORDER — SODIUM CHLORIDE 0.9% FLUSH: 3.0000 mL | INTRAVENOUS | Status: DC | PRN

## 2019-06-12 MED ORDER — DEXAMETHASONE SODIUM PHOSPHATE 10 MG/ML IJ SOLN
INTRAMUSCULAR | Status: DC | PRN
  Administered 2019-06-12: 10 mg via INTRAVENOUS

## 2019-06-12 MED ORDER — CALCIUM CARBONATE-VITAMIN D 500-200 MG-UNIT PO TABS
1.0000 | ORAL_TABLET | Freq: Every day | ORAL | Status: DC
  Administered 2019-06-13 – 2019-06-20 (×8): 1 via ORAL
  Filled 2019-06-12 (×8): qty 1

## 2019-06-12 MED ORDER — LACTATED RINGERS IV SOLN
INTRAVENOUS | Status: DC
  Administered 2019-06-12: 07:00:00 via INTRAVENOUS

## 2019-06-12 SURGICAL SUPPLY — 75 items
BAG DECANTER FOR FLEXI CONT (MISCELLANEOUS) ×3 IMPLANT
BASKET BONE COLLECTION (BASKET) ×3 IMPLANT
BENZOIN TINCTURE PRP APPL 2/3 (GAUZE/BANDAGES/DRESSINGS) ×3 IMPLANT
BLADE CLIPPER SURG (BLADE) IMPLANT
BUR CUTTER 7.0 ROUND (BURR) IMPLANT
BUR MATCHSTICK NEURO 3.0 LAGG (BURR) ×3 IMPLANT
CANISTER SUCT 3000ML PPV (MISCELLANEOUS) ×3 IMPLANT
CARTRIDGE OIL MAESTRO DRILL (MISCELLANEOUS) ×1 IMPLANT
CLOSURE STERI-STRIP 1/2X4 (GAUZE/BANDAGES/DRESSINGS) ×1
CLOSURE WOUND 1/2 X4 (GAUZE/BANDAGES/DRESSINGS) ×2
CLSR STERI-STRIP ANTIMIC 1/2X4 (GAUZE/BANDAGES/DRESSINGS) ×2 IMPLANT
CONT SPEC 4OZ CLIKSEAL STRL BL (MISCELLANEOUS) ×3 IMPLANT
COVER BACK TABLE 60X90IN (DRAPES) ×3 IMPLANT
COVER WAND RF STERILE (DRAPES) ×3 IMPLANT
DECANTER SPIKE VIAL GLASS SM (MISCELLANEOUS) ×3 IMPLANT
DERMABOND ADVANCED (GAUZE/BANDAGES/DRESSINGS) ×2
DERMABOND ADVANCED .7 DNX12 (GAUZE/BANDAGES/DRESSINGS) ×1 IMPLANT
DEVICE INTERBODY ELEVATE 23X8 (Cage) ×8 IMPLANT
DIFFUSER DRILL AIR PNEUMATIC (MISCELLANEOUS) ×3 IMPLANT
DRAPE C-ARM 42X72 X-RAY (DRAPES) ×6 IMPLANT
DRAPE HALF SHEET 40X57 (DRAPES) IMPLANT
DRAPE LAPAROTOMY 100X72X124 (DRAPES) ×3 IMPLANT
DRAPE SURG 17X23 STRL (DRAPES) ×12 IMPLANT
DRSG OPSITE POSTOP 4X6 (GAUZE/BANDAGES/DRESSINGS) ×3 IMPLANT
DRSG OPSITE POSTOP 4X8 (GAUZE/BANDAGES/DRESSINGS) ×3 IMPLANT
DURAPREP 26ML APPLICATOR (WOUND CARE) ×3 IMPLANT
ELECT REM PT RETURN 9FT ADLT (ELECTROSURGICAL) ×3
ELECTRODE REM PT RTRN 9FT ADLT (ELECTROSURGICAL) ×1 IMPLANT
EVACUATOR 1/8 PVC DRAIN (DRAIN) ×6 IMPLANT
GAUZE 4X4 16PLY RFD (DISPOSABLE) ×3 IMPLANT
GAUZE SPONGE 4X4 12PLY STRL (GAUZE/BANDAGES/DRESSINGS) IMPLANT
GLOVE BIO SURGEON STRL SZ 6.5 (GLOVE) ×4 IMPLANT
GLOVE BIO SURGEONS STRL SZ 6.5 (GLOVE) ×2
GLOVE BIOGEL PI IND STRL 6.5 (GLOVE) ×2 IMPLANT
GLOVE BIOGEL PI IND STRL 7.0 (GLOVE) ×2 IMPLANT
GLOVE BIOGEL PI IND STRL 7.5 (GLOVE) ×2 IMPLANT
GLOVE BIOGEL PI INDICATOR 6.5 (GLOVE) ×4
GLOVE BIOGEL PI INDICATOR 7.0 (GLOVE) ×4
GLOVE BIOGEL PI INDICATOR 7.5 (GLOVE) ×4
GLOVE ECLIPSE 9.0 STRL (GLOVE) ×6 IMPLANT
GLOVE EXAM NITRILE XL STR (GLOVE) IMPLANT
GLOVE SURG SS PI 7.0 STRL IVOR (GLOVE) ×12 IMPLANT
GOWN STRL REUS W/ TWL LRG LVL3 (GOWN DISPOSABLE) ×4 IMPLANT
GOWN STRL REUS W/ TWL XL LVL3 (GOWN DISPOSABLE) ×2 IMPLANT
GOWN STRL REUS W/TWL 2XL LVL3 (GOWN DISPOSABLE) IMPLANT
GOWN STRL REUS W/TWL LRG LVL3 (GOWN DISPOSABLE) ×8
GOWN STRL REUS W/TWL XL LVL3 (GOWN DISPOSABLE) ×4
KIT BASIN OR (CUSTOM PROCEDURE TRAY) ×3 IMPLANT
KIT TURNOVER KIT B (KITS) ×3 IMPLANT
MILL MEDIUM DISP (BLADE) ×3 IMPLANT
NEEDLE HYPO 22GX1.5 SAFETY (NEEDLE) ×3 IMPLANT
NS IRRIG 1000ML POUR BTL (IV SOLUTION) ×3 IMPLANT
OIL CARTRIDGE MAESTRO DRILL (MISCELLANEOUS) ×3
PACK LAMINECTOMY NEURO (CUSTOM PROCEDURE TRAY) ×3 IMPLANT
PATTIES SURGICAL 1X1 (DISPOSABLE) ×3 IMPLANT
ROD SOLERA 70MM (Rod) ×2 IMPLANT
ROD SOLERA 70X4.75X (Rod) ×1 IMPLANT
ROD SOLERA 80MM (Rod) ×2 IMPLANT
ROD SOLERA 80X4.75X (Rod) ×1 IMPLANT
SCREW MAS 6.5X45 (Screw) ×12 IMPLANT
SCREW SET SOLERA (Screw) ×12 IMPLANT
SCREW SET SOLERA TI (Screw) ×6 IMPLANT
SPACER SPNL STD 23X8XSTRL (Cage) ×4 IMPLANT
SPCR SPNL STD 23X8XSTRL (Cage) ×4 IMPLANT
SPONGE LAP 4X18 RFD (DISPOSABLE) ×6 IMPLANT
SPONGE SURGIFOAM ABS GEL 100 (HEMOSTASIS) ×6 IMPLANT
STRIP CLOSURE SKIN 1/2X4 (GAUZE/BANDAGES/DRESSINGS) ×4 IMPLANT
SUT VIC AB 0 CT1 18XCR BRD8 (SUTURE) ×2 IMPLANT
SUT VIC AB 0 CT1 8-18 (SUTURE) ×4
SUT VIC AB 2-0 CT1 18 (SUTURE) ×6 IMPLANT
SUT VIC AB 3-0 SH 8-18 (SUTURE) ×6 IMPLANT
TOWEL GREEN STERILE (TOWEL DISPOSABLE) ×3 IMPLANT
TOWEL GREEN STERILE FF (TOWEL DISPOSABLE) ×3 IMPLANT
TRAY FOLEY MTR SLVR 16FR STAT (SET/KITS/TRAYS/PACK) ×3 IMPLANT
WATER STERILE IRR 1000ML POUR (IV SOLUTION) ×3 IMPLANT

## 2019-06-12 NOTE — Progress Notes (Signed)
Pool MD notified that patient's dressing is bloody at the bottom. MD is aware and will continue to monitor. No orders were given by MD.

## 2019-06-12 NOTE — Brief Op Note (Signed)
06/12/2019  12:15 PM  PATIENT:  Belinda Block  68 y.o. female  PRE-OPERATIVE DIAGNOSIS:  Spondylolisthesis  POST-OPERATIVE DIAGNOSIS:  Spondylolisthesis  PROCEDURE:  Procedure(s): POSTERIOR LUMBAR INTERBODY FUSION - LUMBAR TWO-LUMBAR THREE - LUMBAR THREE-LUMBAR FOUR - Posterior Lateral and Interbody fusion (N/A)  SURGEON:  Surgeon(s) and Role:    * Earnie Larsson, MD - Primary  PHYSICIAN ASSISTANT:   ASSISTANTSReinaldo Meeker, NP   ANESTHESIA:   general  EBL:  1000 mL   BLOOD ADMINISTERED:510 CC CELLSAVER  DRAINS: none   LOCAL MEDICATIONS USED:  MARCAINE     SPECIMEN:  No Specimen  DISPOSITION OF SPECIMEN:  N/A  COUNTS:  YES  TOURNIQUET:  * No tourniquets in log *  DICTATION: .Dragon Dictation  PLAN OF CARE: Admit to inpatient   PATIENT DISPOSITION:  PACU - hemodynamically stable.   Delay start of Pharmacological VTE agent (>24hrs) due to surgical blood loss or risk of bleeding: yes

## 2019-06-12 NOTE — Op Note (Signed)
Date of procedure: 06/12/2019  Date of dictation: Same  Service: Neurosurgery  Preoperative diagnosis: L2-3, L3-4 degenerative spondylolisthesis with severe stenosis, neurogenic claudication, and radiculopathy  Postoperative diagnosis: Same  Procedure Name: Bilateral L2-3 and L3-4 decompressive laminotomies, redo, with foraminotomies; more than would be required for simple interbody fusion alone.  L2-3, L3-4 bilateral ponte osteotomies  L2-3, L3-4 posterior lumbar interbody fusion utilizing interbody cages, locally harvested autograft  L2-3-4 posterior lateral arthrodesis utilizing segmental pedicle screw fixation and local autograft  Reexploration of L4-5 posterior lateral arthrodesis with exploration of fusion and removal of hardware  Surgeon:Delmo Matty A.Shep Porter, M.D.  Asst. Surgeon: Reinaldo Meeker, NP  Anesthesia: General  Indication: 68 year old female status post L4-5 decompression and fusion by an outside physician a few years ago.  Patient with severe progressive bilateral lower extremity symptoms with progressive weakness failing conservative management.  Work-up demonstrates evidence of critical spinal stenosis with associated retrolisthesis at L2-3, she has critical spinal stenosis with marked facet arthropathy and degenerative anterolisthesis at L3-4.  She is status post prior L4-5 fusion with solid-appearing fusion and well-placed instrumentation.  Patient presents now for L2-3 and L3-4 decompression and fusion.  Operative note: After induction of anesthesia, patient position prone onto Wilson frame and properly padded.  Lumbar region prepped and draped sterilely.  Incision made overlying L2-3-4 5.  Dissection performed bilaterally.  Retractor placed.  Fluoroscopy used.  Levels confirmed.  Previously placed pedicle screw and station at L4-5 was dissected free, disassembled and the fusion was inspected and found to be solid.  Pedicle screws at L5 were removed as they were no longer necessary.   Previous laminectomy at L4 was reexplored as was the L3-4 and L2-3 interspaces.  Redo decompressive laminotomies and foraminotomies with complete facetectomies were performed at L2-3 and L3-4 bilaterally.  Ligament flavum was elevated and resected.  Complete inferior and superior facetectomies were completed at L2-3 and L3-4 for completion of her ponte osteotomies for restoration of sagittal plane balance.  Bilateral discectomies then performed at L2-3 and L3-4.  Dissipates then prepared for interbody fusion.  With distractor placed the patient's right side the space prepared and cleaned of all soft tissue.  On the left side at L3-4 and 8 mm Medtronic expandable cage packed with locally harvested autograft was impacted into place and expanded to its full extent.  Distractor removed patient's right side.  The space prepared on the right side.  Soft tissue removed interspace.  Morselized autograft packed and see her space.  Second cage packed with autograft was then impacted into place and expanded to its full extent.  Procedure then repeated at L2-3 in a similar fashion again using 8 mm Medtronic expandable cages and local autograft.  Pedicles at L2 and L3 were identified using surface landmarks and intraoperative fluoroscopy.  Superficial bone around the pedicle was then removed using high-speed drill.  Pedicle was then probed using pedicle all each pedicle tract was then probed and found to be solidly within the bone.  Screw tap was then used at all sites.  Screw temple was probed and found to be solidly within the bone.  6.5 x 45 mm Solaris screws from Medtronic were placed bilaterally at L2 and L3.  Final images reveal good position of the cages and the hardware at the proper upper level with normal alignment of spine.  The wound is irrigated one final time.  Short segment titanium rods and placed over the screw heads at L2-3 and 4.  Locked caps placed over the screws.  Locking caps and engaged with a construct  under compression.  Transverse processes at L2-3 and 4 were decorticated.  Morselized autograft was packed posterior laterally for later fusion.  Gelfoam was placed over the laminotomy defects.  Vancomycin powder was placed in deep wound space.  Wounds and closed in layers with Vicryl sutures.  Steri-Strips and sterile dressing were applied.  No apparent complications.  Patient tolerated the procedure well and she returns to the recovery room postop.

## 2019-06-12 NOTE — Transfer of Care (Signed)
Immediate Anesthesia Transfer of Care Note  Patient: Hayley Padilla  Procedure(s) Performed: POSTERIOR LUMBAR INTERBODY FUSION - LUMBAR TWO-LUMBAR THREE - LUMBAR THREE-LUMBAR FOUR - Posterior Lateral and Interbody fusion (N/A Back)  Patient Location: PACU  Anesthesia Type:General  Level of Consciousness: awake and patient cooperative  Airway & Oxygen Therapy: Patient Spontanous Breathing and Patient connected to nasal cannula oxygen  Post-op Assessment: Report given to RN, Post -op Vital signs reviewed and stable and Patient moving all extremities X 4  Post vital signs: Reviewed and stable  Last Vitals:  Vitals Value Taken Time  BP 160/82 06/12/19 1219  Temp    Pulse 81 06/12/19 1221  Resp 12 06/12/19 1221  SpO2 100 % 06/12/19 1221  Vitals shown include unvalidated device data.  Last Pain:  Vitals:   06/12/19 0718  TempSrc:   PainSc: 0-No pain         Complications: No apparent anesthesia complications

## 2019-06-12 NOTE — Progress Notes (Signed)
Pharmacy Antibiotic Note  Hayley Padilla is a 68 y.o. female admitted on 06/12/2019 with back pain and schedule decompression and fusion surgery.    Now s/p bilateral L2-3 and L3-4 decompressive laminotomies, redo, interbody fusion, and removal of hardware on 06/12/19. Pharmacy has been consulted for Vancomycin dosing post op for surgical prophylaxis.  Preop Vanc 1g given 0743 6/29  No drain, thus will need 1 dose of Vancomycin post op Crcl 66 ml/min, afebrile  6/25 covid 19: neg 6/24 surgical pcr: MRSA negative, SA positive   Plan: Vancomycin 1g IV x1 at 20:00 tonight Pharmacy will sign off.  Height: 5\' 2"  (157.5 cm) Weight: 182 lb (82.6 kg) IBW/kg (Calculated) : 50.1  Temp (24hrs), Avg:97.7 F (36.5 C), Min:97.6 F (36.4 C), Max:97.9 F (36.6 C)  Recent Labs  Lab 06/07/19 1352  WBC 5.5  CREATININE 0.62    Estimated Creatinine Clearance: 66.1 mL/min (by C-G formula based on SCr of 0.62 mg/dL).    Allergies  Allergen Reactions  . Penicillins Anaphylaxis    Did it involve swelling of the face/tongue/throat, SOB, or low BP? Yes Did it involve sudden or severe rash/hives, skin peeling, or any reaction on the inside of your mouth or nose? No Did you need to seek medical attention at a hospital or doctor's office? Yes When did it last happen?"Been a While"  If all above answers are "NO", may proceed with cephalosporin use.   . Aspirin Other (See Comments)    brusing _ patient requested to be listed    Thank you for allowing pharmacy to be a part of this patient's care. Nicole Cella, RPh Clinical Pharmacist Please check AMION for all [employer] phone numbers After 10:00 PM, call [place] [phone]  06/12/2019 3:52 PM

## 2019-06-12 NOTE — Addendum Note (Signed)
Addendum  created 06/12/19 1352 by Orlie Dakin, CRNA   Intraprocedure Flowsheets edited

## 2019-06-12 NOTE — Anesthesia Postprocedure Evaluation (Signed)
Anesthesia Post Note  Patient: Hayley Padilla  Procedure(s) Performed: POSTERIOR LUMBAR INTERBODY FUSION - LUMBAR TWO-LUMBAR THREE - LUMBAR THREE-LUMBAR FOUR - Posterior Lateral and Interbody fusion (N/A Back)     Patient location during evaluation: PACU Anesthesia Type: General Level of consciousness: awake and alert Pain management: pain level controlled Vital Signs Assessment: post-procedure vital signs reviewed and stable Respiratory status: spontaneous breathing, nonlabored ventilation and respiratory function stable Cardiovascular status: blood pressure returned to baseline and stable Postop Assessment: no apparent nausea or vomiting Anesthetic complications: no    Last Vitals:  Vitals:   06/12/19 1250 06/12/19 1305  BP: (!) 155/76 (!) 145/69  Pulse: 74 68  Resp: 10 12  Temp:    SpO2: 100% 94%    Last Pain:  Vitals:   06/12/19 1300  TempSrc:   PainSc: 5                  Herta Hink,W. EDMOND

## 2019-06-12 NOTE — H&P (Signed)
Hayley Padilla is an 68 y.o. female.   Chief Complaint: Back pain HPI: 68 year old female status post L4-5 decompression and fusion by an outside physician.  Patient presents with worsening back and bilateral lower extremity symptoms right much worse than left.  Symptoms aggravated by standing or walking.  Patient with progressive weakness in her right lower extremity.  Patient is failed conservative management.  Work-up demonstrates evidence of severe stenosis with retrolisthesis at L2-3 and L3-4.  Fusion appears solid at L4-5.  Patient presents now for two-level lumbar decompression and fusion in hopes of improving her symptoms.  Past Medical History:  Diagnosis Date  . Arthritis   . Hypertension     Past Surgical History:  Procedure Laterality Date  . ABDOMINAL HYSTERECTOMY    . BACK SURGERY     lower back  . MULTIPLE TOOTH EXTRACTIONS      History reviewed. No pertinent family history. Social History:  reports that she has never smoked. Her smokeless tobacco use includes chew. She reports previous alcohol use. She reports that she does not use drugs.  Allergies:  Allergies  Allergen Reactions  . Penicillins Anaphylaxis    Did it involve swelling of the face/tongue/throat, SOB, or low BP? Yes Did it involve sudden or severe rash/hives, skin peeling, or any reaction on the inside of your mouth or nose? No Did you need to seek medical attention at a hospital or doctor's office? Yes When did it last happen?"Been a While"  If all above answers are "NO", may proceed with cephalosporin use.   . Aspirin Other (See Comments)    brusing _ patient requested to be listed    Medications Prior to Admission  Medication Sig Dispense Refill  . amLODipine (NORVASC) 10 MG tablet Take 10 mg by mouth daily.    . Calcium Carb-Cholecalciferol (CALCIUM 600/VITAMIN D3 PO) Take 1 tablet by mouth daily.    . cloNIDine (CATAPRES) 0.1 MG tablet Take 0.1 mg by mouth 2 (two) times daily.    .  diclofenac (VOLTAREN) 75 MG EC tablet Take 75 mg by mouth 2 (two) times daily.    . fluticasone (FLONASE) 50 MCG/ACT nasal spray Place 1 spray into both nostrils daily as needed for allergies or rhinitis.    . furosemide (LASIX) 20 MG tablet Take 20 mg by mouth daily.    Marland Kitchen. HYDROcodone-acetaminophen (NORCO/VICODIN) 5-325 MG tablet Take 1 tablet by mouth every 6 (six) hours as needed for moderate pain.    Marland Kitchen. losartan (COZAAR) 100 MG tablet Take 100 mg by mouth daily.    Marland Kitchen. lovastatin (MEVACOR) 40 MG tablet Take 40 mg by mouth every evening.     . methocarbamol (ROBAXIN) 750 MG tablet Take 750 mg by mouth 2 (two) times a day.    . potassium chloride (K-DUR) 10 MEQ tablet Take 20 mEq by mouth daily.       No results found for this or any previous visit (from the past 48 hour(s)). No results found.  Pertinent items noted in HPI and remainder of comprehensive ROS otherwise negative.  Blood pressure (!) 179/91, pulse 89, temperature 97.9 F (36.6 C), temperature source Oral, resp. rate 20, height 5\' 2"  (1.575 m), weight 82.6 kg, SpO2 99 %.  Patient is awake and alert.  She is oriented and appropriate.  Speech is fluent.  Judgment insight are intact.  Cranial nerve function normal bilateral.  Motor examination extremities reveals weakness of her right quadriceps muscle group and anterior tibialis grading out of  4-/5.  Sensory examination with decrease sensation pinprick light touch in her right L3 and L4 dermatomes.  Deep tendon refills are normal active except her Achilles reflexes are absent and her patellar reflexes are diminished bilaterally.  No evidence of long track signs.  Gait very antalgic.  Posture flexed peer examination head ears eyes nose throat is unremarkable her chest and abdomen are benign.  Extremities are free from injury or deformity. 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  Plan bilateral L2-3 and L3-4 decompressive laminotomies and  foraminotomies followed by posterior lumbar interbody fusion utilizing interbody cages, local harvested autograft, and augmented with posterior lateral arthrodesis utilizing segmental pedicle screw fixation and local autograft.  Risks and benefits of been explained.  Patient wishes to proceed.  Mallie Mussel A Reynolds Kittel 06/12/2019, 7:45 AM

## 2019-06-12 NOTE — Progress Notes (Signed)
Orthopedic Tech Progress Note Patient Details:  Hayley Padilla [birth year] [id number] Nurse called for back brace. Patient ID: MEA OZGA, female   DOB: Mar 09, 1951, 68 y.o.   MRN: [id number]   [name] 06/12/2019, 4:33 PM

## 2019-06-12 NOTE — Anesthesia Procedure Notes (Signed)
Procedure Name: Intubation Date/Time: 06/12/2019 8:04 AM Performed by: Orlie Dakin, CRNA Pre-anesthesia Checklist: Patient identified, Emergency Drugs available, Suction available and Patient being monitored Patient Re-evaluated:Patient Re-evaluated prior to induction Oxygen Delivery Method: Circle system utilized Preoxygenation: Pre-oxygenation with 100% oxygen Induction Type: IV induction and Rapid sequence Laryngoscope Size: Miller and 3 Grade View: Grade I Tube type: Oral Tube size: 7.0 mm Number of attempts: 1 Airway Equipment and Method: Stylet Placement Confirmation: ETT inserted through vocal cords under direct vision,  positive ETCO2 and breath sounds checked- equal and bilateral Secured at: 24 cm Tube secured with: Tape Dental Injury: Teeth and Oropharynx as per pre-operative assessment  Comments: RSI due to Covid-19 pandemic concerns.

## 2019-06-13 LAB — CBC
HCT: 27.5 % — ABNORMAL LOW (ref 36.0–46.0)
Hemoglobin: 8.7 g/dL — ABNORMAL LOW (ref 12.0–15.0)
MCH: 23.3 pg — ABNORMAL LOW (ref 26.0–34.0)
MCHC: 31.6 g/dL (ref 30.0–36.0)
MCV: 73.7 fL — ABNORMAL LOW (ref 80.0–100.0)
Platelets: 206 10*3/uL (ref 150–400)
RBC: 3.73 MIL/uL — ABNORMAL LOW (ref 3.87–5.11)
RDW: 14.6 % (ref 11.5–15.5)
WBC: 13 10*3/uL — ABNORMAL HIGH (ref 4.0–10.5)
nRBC: 0 % (ref 0.0–0.2)

## 2019-06-13 MED FILL — Thrombin For Soln 20000 Unit: CUTANEOUS | Qty: 1 | Status: AC

## 2019-06-13 MED FILL — Gelatin Absorbable Sponge Size 100: CUTANEOUS | Qty: 1 | Status: AC

## 2019-06-13 NOTE — Progress Notes (Signed)
Postop day 1.  Patient with complaints of back pain.  Lower extremity symptoms are improved.  She is mobilizing slowly.  She is awake and alert.  She is oriented and appropriate.  She is afebrile.  Her vital signs are stable.  Urine output is good.  Postoperative hematocrit this morning was 27.  Motor and sensory examination stable.  Chronic right dorsiflexion weakness unchanged.  Wound clean and dry.  Chest and abdomen benign.  Overall doing well following two-level lumbar decompression and fusion.  Work on efforts at [employer].  Possible CIR versus skilled nursing facility versus home

## 2019-06-13 NOTE — Progress Notes (Signed)
Rehab Admissions Coordinator Note:  Per patient was screened by Michel Santee for appropriateness for an Inpatient Acute Rehab Consult.  At this time, we are recommending Inpatient Rehab consult.  Please place IP Rehab MD consult order.   Michel Santee 06/13/2019, 12:53 PM  I can be reached at [phone].

## 2019-06-13 NOTE — Evaluation (Signed)
Occupational Therapy Evaluation Patient Details Name: Hayley Padilla MRN: [id number] DOB: 10-31-1951 Today's Date: 06/13/2019    History of Present Illness Pt is a 68 y/o female s/p L2-3, L3-4 bilateral decompression and fusion. PMH: HTN, arthritis, L4-5 decompresssion/fusion.    Clinical Impression   PTA patient reports independent ADLs/IADLs, using rollator for mobility.  Admitted for above and limited by problem list below, including pain, back precautions, impaired balance, decreased STM, generalized weakness and decreased activity tolerance. Patient educated on precautions, brace mgmt and wear schedule, ADL compensatory techniques, safety, recommendations, and DME.  Patient requires maximal cueing throughout session for back precautions, during ADls, mobility and even while sitting in chair.  Patient requires supervision for UB ADLs, mod assist for LB ADLs, and min guard for transfers; min assist using RW for functional mobility in room due to decreased strength/coordination/foot drop to R LE, walker mgmt, balance, forward lean.  She reports she will have 24/7 support at discharge, but her bedroom/bathroom are on the 2nd level. Patient will benefit from continued OT services while admitted and after dc at CIR level in order to optimize independence and safety with ADLs/mobility.       Follow Up Recommendations  Supervision/Assistance - 24 hour;CIR    Equipment Recommendations  None recommended by OT    Recommendations for Other Services PT consult     Precautions / Restrictions Precautions Precautions: Fall;Back Precaution Booklet Issued: Yes (comment) Precaution Comments: reviewed with pt, but requires constant cueing throughout mobility/self care Required Braces or Orthoses: Spinal Brace Spinal Brace: Lumbar corset;Applied in sitting position(on upon entry, adjusted ) Restrictions Weight Bearing Restrictions: No      Mobility Bed Mobility               General bed  mobility comments: seated OOB in recliner upon entry   Transfers Overall transfer level: Needs assistance Equipment used: Rolling walker (2 wheeled) Transfers: Sit to/from Stand Sit to Stand: Min guard         General transfer comment: cueing for hand placement, posture and technique; min guard for safety     Balance Overall balance assessment: Needs assistance Sitting-balance support: No upper extremity supported;Feet supported Sitting balance-Leahy Scale: Fair     Standing balance support: Bilateral upper extremity supported;During functional activity Standing balance-Leahy Scale: Poor Standing balance comment: relaint on B UE support                           ADL either performed or assessed with clinical judgement   ADL Overall ADL's : Needs assistance/impaired     Grooming: Set up;Sitting   Upper Body Bathing: Set up;Sitting   Lower Body Bathing: Moderate assistance;Sit to/from stand Lower Body Bathing Details (indicate cue type and reason): pt able to complete figure 4 technique with L LE but on RLE; requires cueing for precautions and min guard sit<>stand  Upper Body Dressing : Set up;Supervision/safety;Sitting Upper Body Dressing Details (indicate cue type and reason): assist with brace mgmt  Lower Body Dressing: Moderate assistance;Sit to/from stand;Cueing for back precautions;Cueing for compensatory techniques Lower Body Dressing Details (indicate cue type and reason): pt able to complete figure 4 technique with L LE but on RLE; requires cueing for precautions and min guard sit<>stand; reviewed compensatory techniques with poor carryvoer   Toilet Transfer: Min guard;Ambulation;RW Toilet Transfer Details (indicate cue type and reason): simulated from recliner          Functional mobility during ADLs: Minimal assistance;Min  guard;Rolling walker;Cueing for safety;Cueing for sequencing General ADL Comments: min guard to min assist for mobility with  cueing for posture, positioning, and safety; poor recall of techniques with noted R foot drop and poor coordination of R LE during mobility      Vision         Perception     Praxis      Pertinent Vitals/Pain Pain Assessment: Faces Faces Pain Scale: Hurts even more Pain Location: back-incisional Pain Descriptors / Indicators: Discomfort;Operative site guarding;Grimacing Pain Intervention(s): Monitored during session;Repositioned     Hand Dominance Right   Extremity/Trunk Assessment Upper Extremity Assessment Upper Extremity Assessment: Generalized weakness   Lower Extremity Assessment Lower Extremity Assessment: Defer to PT evaluation   Cervical / Trunk Assessment Cervical / Trunk Assessment: Other exceptions Cervical / Trunk Exceptions: s/p lumbar surgery   Communication Communication Communication: No difficulties   Cognition Arousal/Alertness: Awake/alert Behavior During Therapy: Flat affect Overall Cognitive Status: No family/caregiver present to determine baseline cognitive functioning                                 General Comments: pt with poor recall and adherance to precautions throughout session, decreased safety awareness    General Comments       Exercises     Shoulder Instructions      Home Living Family/patient expects to be discharged to:: Private residence Living Arrangements: Children;Other (Comment)(grandchildren) Available Help at Discharge: Family;Available 24 hours/day Type of Home: House Home Access: Level entry     Home Layout: Two level;Bed/bath upstairs     Bathroom Shower/Tub: Tub/shower unit;Walk-in shower   Bathroom Toilet: Standard     Home Equipment: Environmental consultantWalker - 4 wheels;Bedside commode          Prior Functioning/Environment Level of Independence: Independent with assistive device(s)        Comments: reports using rollator for mobility, independent ADLs/ IADLs        OT Problem List: Decreased  strength;Impaired balance (sitting and/or standing);Decreased activity tolerance;Decreased cognition;Decreased safety awareness;Decreased knowledge of use of DME or AE;Decreased knowledge of precautions;Decreased coordination;Pain      OT Treatment/Interventions: Self-care/ADL training;Therapeutic exercise;DME and/or AE instruction;Therapeutic activities;Patient/family education;Balance training    OT Goals(Current goals can be found in the care plan section) Acute Rehab OT Goals Patient Stated Goal: to have less pain  OT Goal Formulation: With patient Time For Goal Achievement: 06/27/19 Potential to Achieve Goals: Good  OT Frequency: Min 2X/week   Barriers to D/C:            Co-evaluation              AM-PAC OT "6 Clicks" Daily Activity     Outcome Measure Help from another person eating meals?: A Little Help from another person taking care of personal grooming?: A Little Help from another person toileting, which includes using toliet, bedpan, or urinal?: A Lot Help from another person bathing (including washing, rinsing, drying)?: A Lot Help from another person to put on and taking off regular upper body clothing?: A Little Help from another person to put on and taking off regular lower body clothing?: A Lot 6 Click Score: 15   End of Session Equipment Utilized During Treatment: Gait belt;Rolling walker Nurse Communication: Mobility status  Activity Tolerance: Patient tolerated treatment well Patient left: in chair;with call bell/phone within reach  OT Visit Diagnosis: Other abnormalities of gait and mobility (R26.89);Muscle weakness (generalized) (M62.81);Pain Pain -  part of body: (back-incisional)                Time: 1610-96040813-0830 OT Time Calculation (min): 17 min Charges:  OT General Charges $OT Visit: 1 Visit OT Evaluation $OT Eval Moderate Complexity: 1 Mod  Chancy Milroyhristie S Rahmir Beever, OT Acute Rehabilitation Services Pager [phone] Office [phone]   Chancy MilroyChristie  S Beth Spackman 06/13/2019, 9:25 AM

## 2019-06-13 NOTE — Evaluation (Signed)
Physical Therapy Evaluation Patient Details Name: Hayley Padilla L Frentz MRN: [id number] DOB: 06-24-1951 Today's Date: 06/13/2019   History of Present Illness  Pt is a 68 y/o female s/p L2-3, L3-4 bilateral decompression and fusion. PMH: HTN, arthritis, L4-5 decompresssion/fusion.   Clinical Impression  Patient is s/p above surgery resulting in the deficits listed below (see PT Problem List). Pt requiring modA for all mobiltiy and ambulation greatly limited by weakness and back pain. Pt amb in extreme trunk flexion. Pt reports having 24/7 supervision however has a flight of stairs to access bed/bath. Recommend CIR upon d/c to achieve safe supervision level of function and ability to complete stair negotiation. Patient will benefit from skilled PT to increase their independence and safety with mobility (while adhering to their precautions) to allow discharge to the venue listed below.     Follow Up Recommendations CIR    Equipment Recommendations  Rolling walker with 5" wheels    Recommendations for Other Services Rehab consult     Precautions / Restrictions Precautions Precautions: Fall;Back Precaution Booklet Issued: Yes (comment) Precaution Comments: reviewed with pt, but requires constant cueing throughout mobility/self care Required Braces or Orthoses: Spinal Brace Spinal Brace: Lumbar corset;Applied in sitting position(on upon entry, adjusted ) Restrictions Weight Bearing Restrictions: No      Mobility  Bed Mobility               General bed mobility comments: seated OOB in recliner upon entry   Transfers Overall transfer level: Needs assistance Equipment used: Rolling walker (2 wheeled) Transfers: Sit to/from Stand Sit to Stand: Min guard         General transfer comment: cueing for hand placement, posture and technique; min guard for safety   Ambulation/Gait Ambulation/Gait assistance: Min assist;Mod assist Gait Distance (Feet): 15 Feet(x2) Assistive device:  Rolling walker (2 wheeled) Gait Pattern/deviations: Step-to pattern;Decreased step length - right;Decreased stance time - right;Decreased dorsiflexion - right;Decreased weight shift to right;Narrow base of support Gait velocity: slow   General Gait Details: pt very deconditioned with significant trunk flexion despite wearing back brace, pt unable to clear R LE , pt with very narrow gait pattern, stop frequently due to back pain, pt required max encouragement ot make it to the door and was unable to stand up completely straight  Stairs Stairs: (unable to attempt due to weakness)          Wheelchair Mobility    Modified Rankin (Stroke Patients Only)       Balance Overall balance assessment: Needs assistance Sitting-balance support: No upper extremity supported;Feet supported Sitting balance-Leahy Scale: Fair     Standing balance support: Bilateral upper extremity supported;During functional activity Standing balance-Leahy Scale: Poor Standing balance comment: relaint on B UE support                             Pertinent Vitals/Pain Pain Assessment: 0-10 Pain Score: 8  Faces Pain Scale: Hurts even more Pain Location: back-incisional, only when moving, no pain when sitting or laying down Pain Descriptors / Indicators: Discomfort;Operative site guarding;Grimacing Pain Intervention(s): Monitored during session    Home Living Family/patient expects to be discharged to:: Private residence Living Arrangements: Children;Other (Comment)(grandchildren) Available Help at Discharge: Family;Available 24 hours/day Type of Home: House Home Access: Level entry     Home Layout: Two level;Bed/bath upstairs Home Equipment: Walker - 4 wheels;Bedside commode Additional Comments: pt reports having a recliner she can sleep in and a 1/2 bath  on the first floor    Prior Function Level of Independence: Independent with assistive device(s)         Comments: reports using  rollator for mobility, independent ADLs/ IADLs     Hand Dominance   Dominant Hand: Right    Extremity/Trunk Assessment   Upper Extremity Assessment Upper Extremity Assessment: Generalized weakness(noted R sided weakness in function)    Lower Extremity Assessment Lower Extremity Assessment: RLE deficits/detail;LLE deficits/detail RLE Deficits / Details: pt with noted difficulty functioning, unable to clear foot amb, able to move in sitting  but difficulty ambulation LLE Deficits / Details: generalized weakness, able to clear foot but progressively got weaker with ambulation    Cervical / Trunk Assessment Cervical / Trunk Assessment: Other exceptions Cervical / Trunk Exceptions: s/p lumbar surgery  Communication   Communication: No difficulties  Cognition Arousal/Alertness: Awake/alert(but also would easily fall asleep) Behavior During Therapy: Flat affect Overall Cognitive Status: No family/caregiver present to determine baseline cognitive functioning                                 General Comments: pt with poor recall and adherance to precautions throughout session, decreased safety awareness, unsure of accuracy of history, her answer to myself vs different varied      General Comments General comments (skin integrity, edema, etc.): VSS    Exercises     Assessment/Plan    PT Assessment Patient needs continued PT services  PT Problem List Decreased strength;Decreased range of motion;Decreased activity tolerance;Decreased balance;Decreased mobility;Decreased coordination;Decreased cognition;Decreased knowledge of use of DME;Decreased safety awareness       PT Treatment Interventions DME instruction;Gait training;Stair training;Functional mobility training;Therapeutic activities;Therapeutic exercise;Cognitive remediation;Neuromuscular re-education;Balance training    PT Goals (Current goals can be found in the Care Plan section)  Acute Rehab PT  Goals Patient Stated Goal: go to rehab to get better PT Goal Formulation: With patient Time For Goal Achievement: 06/27/19 Potential to Achieve Goals: Good    Frequency Min 5X/week   Barriers to discharge Inaccessible home environment pt has flight of stairs to bedroom/bathroom    Co-evaluation               AM-PAC PT "6 Clicks" Mobility  Outcome Measure Help needed turning from your back to your side while in a flat bed without using bedrails?: A Lot Help needed moving from lying on your back to sitting on the side of a flat bed without using bedrails?: A Lot Help needed moving to and from a bed to a chair (including a wheelchair)?: A Lot Help needed standing up from a chair using your arms (e.g., wheelchair or bedside chair)?: A Lot Help needed to walk in hospital room?: A Lot Help needed climbing 3-5 steps with a railing? : A Lot 6 Click Score: 12    End of Session Equipment Utilized During Treatment: Gait belt;Back brace Activity Tolerance: Patient limited by pain Patient left: in chair;with call bell/phone within reach Nurse Communication: Mobility status PT Visit Diagnosis: Difficulty in walking, not elsewhere classified (R26.2)    Time: 3149-7026 PT Time Calculation (min) (ACUTE ONLY): 20 min   Charges:   PT Evaluation $PT Eval Moderate Complexity: 1 Mod          Kittie Plater, PT, DPT Acute Rehabilitation Services Pager #: [phone] Office #: [phone]   Berline Lopes 06/13/2019, 12:59 PM

## 2019-06-14 NOTE — Progress Notes (Signed)
Neurosurgery Service Progress Note  Subjective: No acute events overnight, feeling better, leg symptoms improved, back pain as expected but not out of proportion to expectations  Objective: Vitals:   06/13/19 1933 06/13/19 2324 06/14/19 0439 06/14/19 0743  BP: (!) 120/59 (!) 124/57 125/67 (!) 130/55  Pulse: 99 92 99 98  Resp: 18 18 18 18   Temp: 98.7 F (37.1 C) 99 F (37.2 C) 98.6 F (37 C) 98.5 F (36.9 C)  TempSrc: Oral Oral Oral Oral  SpO2: 99% 98% 98% 100%  Weight:      Height:       Temp (24hrs), Avg:98.9 F (37.2 C), Min:98.5 F (36.9 C), Max:99.9 F (37.7 C)  CBC Latest Ref Rng & Units 06/13/2019 06/07/2019  WBC 4.0 - 10.5 K/uL 13.0(H) 5.5  Hemoglobin 12.0 - 15.0 g/dL 8.7(L) 11.6(L)  Hematocrit 36.0 - 46.0 % 27.5(L) 37.5  Platelets 150 - 400 K/uL 206 308   BMP Latest Ref Rng & Units 06/07/2019  Glucose 70 - 99 mg/dL 136(H)  BUN 8 - 23 mg/dL 11  Creatinine 0.44 - 1.00 mg/dL 0.62  Sodium 135 - 145 mmol/L 142  Potassium 3.5 - 5.1 mmol/L 3.8  Chloride 98 - 111 mmol/L 108  CO2 22 - 32 mmol/L 26  Calcium 8.9 - 10.3 mg/dL 9.1    Intake/Output Summary (Last 24 hours) at 06/14/2019 0951 Last data filed at 06/13/2019 1515 Gross per 24 hour  Intake 240 ml  Output 350 ml  Net -110 ml    Current Facility-Administered Medications:  .  0.9 %  sodium chloride infusion, 250 mL, Intravenous, Continuous, Pool, Henry, MD .  acetaminophen (TYLENOL) tablet 650 mg, 650 mg, Oral, Q4H PRN, 650 mg at 06/14/19 0007 **OR** acetaminophen (TYLENOL) suppository 650 mg, 650 mg, Rectal, Q4H PRN, Earnie Larsson, MD .  amLODipine (NORVASC) tablet 10 mg, 10 mg, Oral, Daily, Pool, Mallie Mussel, MD, 10 mg at 06/13/19 1216 .  bisacodyl (DULCOLAX) suppository 10 mg, 10 mg, Rectal, Daily PRN, Earnie Larsson, MD .  calcium-vitamin D (OSCAL WITH D) 500-200 MG-UNIT per tablet 1 tablet, 1 tablet, Oral, Q breakfast, Earnie Larsson, MD, 1 tablet at 06/14/19 0735 .  cloNIDine (CATAPRES) tablet 0.1 mg, 0.1 mg, Oral, BID,  Earnie Larsson, MD, 0.1 mg at 06/13/19 2124 .  diazepam (VALIUM) tablet 5-10 mg, 5-10 mg, Oral, Q6H PRN, Earnie Larsson, MD, 5 mg at 06/14/19 0006 .  fluticasone (FLONASE) 50 MCG/ACT nasal spray 1 spray, 1 spray, Each Nare, Daily PRN, Pool, Mallie Mussel, MD .  furosemide (LASIX) tablet 20 mg, 20 mg, Oral, Daily, Pool, Mallie Mussel, MD, 20 mg at 06/13/19 1216 .  HYDROcodone-acetaminophen (NORCO) 10-325 MG per tablet 1 tablet, 1 tablet, Oral, Q4H PRN, Earnie Larsson, MD, 1 tablet at 06/14/19 0530 .  HYDROmorphone (DILAUDID) injection 1 mg, 1 mg, Intravenous, Q2H PRN, Earnie Larsson, MD, 1 mg at 06/12/19 2020 .  hydrOXYzine (VISTARIL) injection 50 mg, 50 mg, Intramuscular, Q6H PRN, Earnie Larsson, MD, 50 mg at 06/13/19 0002 .  losartan (COZAAR) tablet 100 mg, 100 mg, Oral, Daily, Pool, Mallie Mussel, MD, 100 mg at 06/13/19 1216 .  menthol-cetylpyridinium (CEPACOL) lozenge 3 mg, 1 lozenge, Oral, PRN **OR** phenol (CHLORASEPTIC) mouth spray 1 spray, 1 spray, Mouth/Throat, PRN, Earnie Larsson, MD .  mupirocin ointment (BACTROBAN) 2 % 1 application, 1 application, Nasal, BID, Earnie Larsson, MD, 1 application at 78/67/67 2125 .  ondansetron (ZOFRAN) tablet 4 mg, 4 mg, Oral, Q6H PRN **OR** ondansetron (ZOFRAN) injection 4 mg, 4 mg, Intravenous, Q6H PRN, Pool,  Sherilyn CooterHenry, MD, 4 mg at 06/13/19 0530 .  oxyCODONE (Oxy IR/ROXICODONE) immediate release tablet 10 mg, 10 mg, Oral, Q3H PRN, Julio SicksPool, Henry, MD, 10 mg at 06/13/19 0429 .  polyethylene glycol (MIRALAX / GLYCOLAX) packet 17 g, 17 g, Oral, Daily PRN, Pool, Sherilyn CooterHenry, MD .  potassium chloride SA (K-DUR) CR tablet 20 mEq, 20 mEq, Oral, Daily, Pool, Sherilyn CooterHenry, MD, 20 mEq at 06/13/19 1216 .  pravastatin (PRAVACHOL) tablet 40 mg, 40 mg, Oral, q1800, Julio SicksPool, Henry, MD, 40 mg at 06/13/19 1725 .  sodium chloride flush (NS) 0.9 % injection 3 mL, 3 mL, Intravenous, Q12H, Pool, Sherilyn CooterHenry, MD, 3 mL at 06/13/19 2142 .  sodium chloride flush (NS) 0.9 % injection 3 mL, 3 mL, Intravenous, PRN, Julio SicksPool, Henry, MD .  sodium phosphate  (FLEET) 7-19 GM/118ML enema 1 enema, 1 enema, Rectal, Once PRN, Julio SicksPool, Henry, MD   Physical Exam: AOx3, PERRL, EOMI, FS, Strength 5/5 except for 4/5 in L EHL, R S1 distribution numbness (pt states is baseline)  Assessment & Plan: 68 y.o. woman s/p 2 level PLIF, recovering well.  -c/s PM&R for CIR transfer  Jadene Pierinihomas A Johanan Skorupski  06/14/19 9:51 AM

## 2019-06-14 NOTE — Progress Notes (Signed)
Inpatient Rehab Admissions:  Inpatient Rehab Consult received.  I met with patient at the bedside for rehabilitation assessment and to discuss goals and expectations of an inpatient rehab admission.  She is interested in [facility] program, states she lives with her son and daughter who can provide 24/7 assist.  I will open a case with her insurance and confirm support at home prior to possible admission later this week.   Signed: Shann Medal, PT, DPT Admissions Coordinator [phone] 06/14/19  11:57 AM

## 2019-06-14 NOTE — TOC Initial Note (Signed)
Transition of Care [facility] - Initial/Assessment Note    Patient Details  Name: Hayley Padilla MRN: [id number] Date of Birth: 03-13-1951  [facility] CM/SW Contact:    Pollie Friar, RN Phone Number: 06/14/2019, 11:28 AM  Clinical Narrative:                 Recommendations are for CIR. Pt prefers CIR but is willing to d/c to SNF rehab if CIR is not an option. Patient is agreeable to being faxed out in the [facility] area. FL2 completed.  TOC following and awaiting CIR eval.   Expected Discharge Plan: IP Rehab Facility Barriers to Discharge: Continued Medical Work up   Patient Goals and CMS Choice   CMS Medicare.gov Compare Post Acute Care list provided to:: Patient Choice offered to / list presented to : Patient  Expected Discharge Plan and Services Expected Discharge Plan: [place] In-house Referral: Clinical Social Work Discharge Planning Services: CM Consult Post Acute Care Choice: IP Rehab, [place]   Expected Discharge Date: (Pending)                                    Prior Living Arrangements/Services   Lives with:: Relatives Patient language and need for interpreter reviewed:: Yes(no needs) Do you feel safe going back to the place where you live?: Yes      Need for Family Participation in Patient Care: Yes (Comment) Care giver support system in place?: No (comment)   Criminal Activity/Legal Involvement Pertinent to Current Situation/Hospitalization: No - Comment as needed  Activities of Daily Living Home Assistive Devices/Equipment: Walker (specify type), Cane (specify quad or straight), Eyeglasses, Dentures (specify type), Blood pressure cuff ADL Screening (condition at time of admission) Patient's cognitive ability adequate to safely complete daily activities?: Yes Is the patient deaf or have difficulty hearing?: No Does the patient have difficulty seeing, even when wearing glasses/contacts?: No Does the  patient have difficulty concentrating, remembering, or making decisions?: No Patient able to express need for assistance with ADLs?: Yes Does the patient have difficulty dressing or bathing?: Yes Independently performs ADLs?: No Communication: Independent Dressing (OT): Needs assistance Is this a change from baseline?: Change from baseline, expected to last <3days Grooming: Independent Feeding: Independent Bathing: Needs assistance Is this a change from baseline?: Change from baseline, expected to last <3 days Toileting: Needs assistance Is this a change from baseline?: Change from baseline, expected to last <3 days In/Out Bed: Needs assistance Is this a change from baseline?: Change from baseline, expected to last <3 days Does the patient have difficulty walking or climbing stairs?: Yes Weakness of Legs: Right Weakness of Arms/Hands: Both  Permission Sought/Granted                  Emotional Assessment Appearance:: Appears stated age Attitude/Demeanor/Rapport: Engaged Affect (typically observed): Accepting, Pleasant Orientation: : Oriented to Self, Oriented to Place, Oriented to  Time, Oriented to Situation   Psych Involvement: No (comment)  Admission diagnosis:  Spondylolisthesis Patient Active Problem List   Diagnosis Date Noted  . Degenerative spondylolisthesis 06/12/2019   PCP:  Bonney Aid, PA-C Pharmacy:   [facility] [place], [place] - [address] [place] [address] Phone: [phone] Fax: [phone]     Social Determinants of Health (SDOH) Interventions    Readmission Risk Interventions No flowsheet  data found.

## 2019-06-14 NOTE — Progress Notes (Signed)
Occupational Therapy Treatment Patient Details Name: Hayley Padilla MRN: [id number] DOB: May 20, 1951 Today's Date: 06/14/2019    History of present illness Pt is a 68 y/o female s/p L2-3, L3-4 bilateral decompression and fusion. PMH: HTN, arthritis, L4-5 decompresssion/fusion.    OT comments  Pt progressing with self care and mobility.  Completed mobility to commode today, min guard for transfers and min assist for mobility using RW; constant cueing for posture, walker mgmt, and precautions functionally but able to verbally recall precautions. Continue to recommend CIR after dc in order to reach modified independent level with ADLs.    Follow Up Recommendations  Supervision/Assistance - 24 hour;CIR    Equipment Recommendations  None recommended by OT    Recommendations for Other Services      Precautions / Restrictions Precautions Precautions: Fall;Back Precaution Booklet Issued: Yes (comment) Precaution Comments: reviewed with pt, but requires constant cueing throughout mobility/self care; able to recall 2/3 initally and 3/3 at completion of session Required Braces or Orthoses: Spinal Brace Spinal Brace: Lumbar corset;Applied in sitting position Restrictions Weight Bearing Restrictions: No       Mobility Bed Mobility Overal bed mobility: Needs Assistance Bed Mobility: Sidelying to Sit   Sidelying to sit: Min guard     Sit to sidelying: Min assist General bed mobility comments: pt sidelying upon entry, min guard for safety and cueing for precuations   Transfers Overall transfer level: Needs assistance Equipment used: Rolling walker (2 wheeled) Transfers: Sit to/from Stand Sit to Stand: Min guard         General transfer comment: cueing for hand placement and safety, increased time and effort to ascend into standing     Balance Overall balance assessment: Needs assistance Sitting-balance support: No upper extremity supported;Feet supported Sitting balance-Leahy  Scale: Fair     Standing balance support: Bilateral upper extremity supported;No upper extremity supported;During functional activity Standing balance-Leahy Scale: Poor Standing balance comment: able to complete grooming without UE support given min guard but reliant on B UE support during mobility                           ADL either performed or assessed with clinical judgement   ADL Overall ADL's : Needs assistance/impaired     Grooming: Min guard;Standing;Wash/dry hands               Lower Body Dressing: Moderate assistance;Sit to/from stand Lower Body Dressing Details (indicate cue type and reason): assist to adjust B socks, sit to stand min guard  Toilet Transfer: Min guard;Ambulation;RW Toilet Transfer Details (indicate cue type and reason): cueing for hand placement, technique and posture  Toileting- Clothing Manipulation and Hygiene: Min guard;Sit to/from stand       Functional mobility during ADLs: Minimal assistance;Rolling walker;Cueing for safety;Cueing for sequencing General ADL Comments: cueing for posture, walker mgmt, safety, precautions      Vision       Perception     Praxis      Cognition Arousal/Alertness: Awake/alert Behavior During Therapy: Flat affect Overall Cognitive Status: No family/caregiver present to determine baseline cognitive functioning                                 General Comments: pt with improved recall of precautions, but requires cueing to adhere to functionally; decreased safety and awareness of deficits         Exercises  Shoulder Instructions       General Comments VSS    Pertinent Vitals/ Pain       Pain Assessment: Faces Pain Score: 8  Faces Pain Scale: Hurts even more Pain Location: back-incisional, only when moving, no pain when sitting or laying down Pain Descriptors / Indicators: Sharp Pain Intervention(s): Monitored during session;Repositioned  Home Living                                           Prior Functioning/Environment              Frequency  Min 2X/week        Progress Toward Goals  OT Goals(current goals can now be found in the care plan section)  Progress towards OT goals: Progressing toward goals  Acute Rehab OT Goals Patient Stated Goal: go to rehab to get better OT Goal Formulation: With patient  Plan Discharge plan remains appropriate;Frequency remains appropriate    Co-evaluation                 AM-PAC OT "6 Clicks" Daily Activity     Outcome Measure   Help from another person eating meals?: A Little Help from another person taking care of personal grooming?: A Little Help from another person toileting, which includes using toliet, bedpan, or urinal?: A Little Help from another person bathing (including washing, rinsing, drying)?: A Lot Help from another person to put on and taking off regular upper body clothing?: A Little Help from another person to put on and taking off regular lower body clothing?: A Lot 6 Click Score: 16    End of Session Equipment Utilized During Treatment: Gait belt;Rolling walker;Back brace  OT Visit Diagnosis: Other abnormalities of gait and mobility (R26.89);Muscle weakness (generalized) (M62.81);Pain Pain - part of body: (back-incisional)   Activity Tolerance Patient tolerated treatment well   Patient Left in chair;with call bell/phone within reach   Nurse Communication Mobility status        Time: [id number] OT Time Calculation (min): 20 min  Charges: OT General Charges $OT Visit: 1 Visit OT Treatments $Self Care/Home Management : 8-22 mins  Chancy Milroyhristie S Collie Kittel, OT Acute Rehabilitation Services Pager [phone] Office [phone]    Chancy MilroyChristie S Johniece Hornbaker 06/14/2019, 11:15 AM

## 2019-06-14 NOTE — Progress Notes (Signed)
Report given to receiving nurse Baxter Flattery, RN with all questions answered. Patient will be transferred by oncoming shift.

## 2019-06-14 NOTE — NC FL2 (Signed)
[place] LEVEL OF CARE SCREENING TOOL     IDENTIFICATION  Patient Name: Hayley Padilla Birthdate: August 25, 1951 Sex: female Admission Date (Current Location): 06/12/2019  [facility] and [place] Number:  Herbalist and Address:  [facility] [place], [place] [address], [place], [place] [address]      Provider Number: [id number]  Attending Physician Name and Address:  Earnie Larsson, MD  Relative Name and Phone Number:       Current Level of Care: Hospital Recommended Level of Care: [place] Prior Approval Number:    Date Approved/Denied:   PASRR Number: [id number]  Discharge Plan: SNF    Current Diagnoses: Patient Active Problem List   Diagnosis Date Noted  . Degenerative spondylolisthesis 06/12/2019    Orientation RESPIRATION BLADDER Height & Weight     Self, Time, Situation, Place  Normal Continent Weight: 82.6 kg Height:  5\' 2"  (157.5 cm)  BEHAVIORAL SYMPTOMS/MOOD NEUROLOGICAL BOWEL NUTRITION STATUS      Continent Diet(heart healthy with thin liquids)  AMBULATORY STATUS COMMUNICATION OF NEEDS Skin   Extensive Assist Verbally Surgical wounds(back has a dressing)                       Personal Care Assistance Level of Assistance  Bathing, Dressing, Feeding Bathing Assistance: Limited assistance Feeding assistance: Independent Dressing Assistance: Maximum assistance     Functional Limitations Info  Sight, Hearing, Speech Sight Info: Adequate Hearing Info: Adequate Speech Info: Adequate    SPECIAL CARE FACTORS FREQUENCY  OT (By licensed OT), PT (By licensed PT)     PT Frequency: 5x/wk OT Frequency: 5x/wk            Contractures Contractures Info: Not present    Additional Factors Info  Code Status, Allergies, Psychotropic Code Status Info: full Allergies Info: Penicillin and aspirin Psychotropic Info: Valium 5 mg every 6 hours as needed         Current Medications (06/14/2019):  This is the  current hospital active medication list Current Facility-Administered Medications  Medication Dose Route Frequency Provider Last Rate Last Dose  . 0.9 %  sodium chloride infusion  250 mL Intravenous Continuous Pool, Mallie Mussel, MD      . acetaminophen (TYLENOL) tablet 650 mg  650 mg Oral Q4H PRN Earnie Larsson, MD   650 mg at 06/14/19 0007   Or  . acetaminophen (TYLENOL) suppository 650 mg  650 mg Rectal Q4H PRN Earnie Larsson, MD      . amLODipine (NORVASC) tablet 10 mg  10 mg Oral Daily Earnie Larsson, MD   10 mg at 06/14/19 1019  . bisacodyl (DULCOLAX) suppository 10 mg  10 mg Rectal Daily PRN Earnie Larsson, MD      . calcium-vitamin D (OSCAL WITH D) 500-200 MG-UNIT per tablet 1 tablet  1 tablet Oral Q breakfast Earnie Larsson, MD   1 tablet at 06/14/19 0735  . cloNIDine (CATAPRES) tablet 0.1 mg  0.1 mg Oral BID Earnie Larsson, MD   0.1 mg at 06/14/19 1019  . diazepam (VALIUM) tablet 5-10 mg  5-10 mg Oral Q6H PRN Earnie Larsson, MD   5 mg at 06/14/19 1020  . fluticasone (FLONASE) 50 MCG/ACT nasal spray 1 spray  1 spray Each Nare Daily PRN Earnie Larsson, MD      . furosemide (LASIX) tablet 20 mg  20 mg Oral Daily Earnie Larsson, MD   20 mg at 06/14/19 1020  . HYDROcodone-acetaminophen (NORCO) 10-325 MG  per tablet 1 tablet  1 tablet Oral Q4H PRN Julio SicksPool, Henry, MD   1 tablet at 06/14/19 0530  . HYDROmorphone (DILAUDID) injection 1 mg  1 mg Intravenous Q2H PRN Julio SicksPool, Henry, MD   1 mg at 06/12/19 2020  . hydrOXYzine (VISTARIL) injection 50 mg  50 mg Intramuscular Q6H PRN Julio SicksPool, Henry, MD   50 mg at 06/13/19 0002  . losartan (COZAAR) tablet 100 mg  100 mg Oral Daily Julio SicksPool, Henry, MD   100 mg at 06/14/19 1019  . menthol-cetylpyridinium (CEPACOL) lozenge 3 mg  1 lozenge Oral PRN Julio SicksPool, Henry, MD       Or  . phenol (CHLORASEPTIC) mouth spray 1 spray  1 spray Mouth/Throat PRN Julio SicksPool, Henry, MD      . mupirocin ointment (BACTROBAN) 2 % 1 application  1 application Nasal BID Julio SicksPool, Henry, MD   1 application at 06/14/19 1021  . ondansetron  (ZOFRAN) tablet 4 mg  4 mg Oral Q6H PRN Julio SicksPool, Henry, MD       Or  . ondansetron [facility](ZOFRAN) injection 4 mg  4 mg Intravenous Q6H PRN Julio SicksPool, Henry, MD   4 mg at 06/13/19 0530  . oxyCODONE (Oxy IR/ROXICODONE) immediate release tablet 10 mg  10 mg Oral Q3H PRN Julio SicksPool, Henry, MD   10 mg at 06/14/19 1020  . polyethylene glycol (MIRALAX / GLYCOLAX) packet 17 g  17 g Oral Daily PRN Julio SicksPool, Henry, MD      . potassium chloride SA (K-DUR) CR tablet 20 mEq  20 mEq Oral Daily Julio SicksPool, Henry, MD   20 mEq at 06/14/19 1020  . pravastatin (PRAVACHOL) tablet 40 mg  40 mg Oral q1800 Julio SicksPool, Henry, MD   40 mg at 06/13/19 1725  . sodium chloride flush (NS) 0.9 % injection 3 mL  3 mL Intravenous Eugenio HoesQ12H Pool, Henry, MD   3 mL at 06/13/19 2142  . sodium chloride flush (NS) 0.9 % injection 3 mL  3 mL Intravenous PRN Julio SicksPool, Henry, MD      . sodium phosphate (FLEET) 7-19 GM/118ML enema 1 enema  1 enema Rectal Once PRN Julio SicksPool, Henry, MD         Discharge Medications: Please see discharge summary for a list of discharge medications.  Relevant Imaging Results:  Relevant Lab Results:   Additional Information SS#: [id number]  Kermit BaloKelli F Leilany Digeronimo, RN

## 2019-06-14 NOTE — Progress Notes (Signed)
Physical Therapy Treatment Patient Details Name: Hayley Padilla MRN: [id number] DOB: 01/05/1951 Today's Date: 06/14/2019    History of Present Illness Pt is a 68 y/o female s/p L2-3, L3-4 bilateral decompression and fusion. PMH: HTN, arthritis, L4-5 decompresssion/fusion.     PT Comments    Pt with improved transfer and ambulation ability today however remains to be unable to ascend 1 stair despite x4 attempts and patient has a flight of stairs at home to access bed/bath. Pt limited today by 8/10 back pain and deconditioning. Cont to recommend CIR to progress to safe mod I level of function. Acute PT to cont to follow.    Follow Up Recommendations  CIR     Equipment Recommendations  Rolling walker with 5" wheels    Recommendations for Other Services Rehab consult     Precautions / Restrictions Precautions Precautions: Fall;Back Precaution Booklet Issued: Yes (comment) Precaution Comments: reviewed with pt, but requires constant cueing throughout mobility/self care Required Braces or Orthoses: Spinal Brace Spinal Brace: Lumbar corset;Applied in sitting position Restrictions Weight Bearing Restrictions: No    Mobility  Bed Mobility Overal bed mobility: Needs Assistance Bed Mobility: Sit to Sidelying         Sit to sidelying: Min assist General bed mobility comments: max directional verbal cues to not twist, minA for LEs up into bed  Transfers Overall transfer level: Needs assistance Equipment used: Rolling walker (2 wheeled) Transfers: Sit to/from Stand Sit to Stand: Min assist         General transfer comment: verbal cues for hand placement, minA to steady during transition of hands from chair to RW due to increased bilat knee flexion  Ambulation/Gait Ambulation/Gait assistance: Min assist Gait Distance (Feet): 25 Feet(x2) Assistive device: Rolling walker (2 wheeled) Gait Pattern/deviations: Step-through pattern;Decreased stride length Gait velocity:  slow Gait velocity interpretation: <1.8 ft/sec, indicate of risk for recurrent falls General Gait Details: pt with increased trunk extension compared to eval and improved R LE advancement and foot clearance however remains limited by 10/10 back pain with mobility   Stairs Stairs: (attempted x4 however unable)           Wheelchair Mobility    Modified Rankin (Stroke Patients Only)       Balance Overall balance assessment: Needs assistance Sitting-balance support: No upper extremity supported;Feet supported Sitting balance-Leahy Scale: Fair     Standing balance support: Bilateral upper extremity supported;During functional activity Standing balance-Leahy Scale: Poor Standing balance comment: relaint on B UE support                            Cognition Arousal/Alertness: Awake/alert Behavior During Therapy: Flat affect Overall Cognitive Status: No family/caregiver present to determine baseline cognitive functioning                                 General Comments: pt with decreased insight to safety, slightly impulsive, suspect this to be her baseline personality, pt did recall precautions however requires v/c's to adhere functionally      Exercises      General Comments General comments (skin integrity, edema, etc.): VSS      Pertinent Vitals/Pain Pain Assessment: 0-10 Pain Score: 8  Pain Location: back-incisional, only when moving, no pain when sitting or laying down Pain Descriptors / Indicators: Sharp Pain Intervention(s): Patient requesting pain meds-RN notified;Monitored during session    Home Living  Prior Function            PT Goals (current goals can now be found in the care plan section) Progress towards PT goals: Progressing toward goals    Frequency    Min 5X/week      PT Plan Current plan remains appropriate    Co-evaluation              AM-PAC PT "6 Clicks" Mobility    Outcome Measure  Help needed turning from your back to your side while in a flat bed without using bedrails?: A Lot Help needed moving from lying on your back to sitting on the side of a flat bed without using bedrails?: A Lot Help needed moving to and from a bed to a chair (including a wheelchair)?: A Lot Help needed standing up from a chair using your arms (e.g., wheelchair or bedside chair)?: A Lot Help needed to walk in hospital room?: A Lot Help needed climbing 3-5 steps with a railing? : A Lot 6 Click Score: 12    End of Session Equipment Utilized During Treatment: Gait belt;Back brace Activity Tolerance: Patient limited by pain Patient left: in chair;with call bell/phone within reach Nurse Communication: Mobility status PT Visit Diagnosis: Difficulty in walking, not elsewhere classified (R26.2)     Time: 1003-1020 PT Time Calculation (min) (ACUTE ONLY): 17 min  Charges:  $Gait Training: 8-22 mins                     Lewis ShockAshly Bryce Cheever, PT, DPT Acute Rehabilitation Services Pager #: [phone] Office #: [phone]    [name] 06/14/2019, 10:59 AM

## 2019-06-15 MED ORDER — DICLOFENAC SODIUM 75 MG PO TBEC: 75.0000 mg | DELAYED_RELEASE_TABLET | Freq: Two times a day (BID) | ORAL | Status: DC

## 2019-06-15 MED ORDER — HYDROCODONE-ACETAMINOPHEN 5-325 MG PO TABS: 1.0000 | ORAL_TABLET | Freq: Four times a day (QID) | ORAL | 0 refills | Status: DC | PRN

## 2019-06-15 MED FILL — Heparin Sodium (Porcine) Inj 1000 Unit/ML: INTRAMUSCULAR | Qty: 30 | Status: AC

## 2019-06-15 MED FILL — Sodium Chloride IV Soln 0.9%: INTRAVENOUS | Qty: 2000 | Status: AC

## 2019-06-15 NOTE — Progress Notes (Signed)
Physical Therapy Treatment Patient Details Name: Hayley Padilla MRN: [id number] DOB: Nov 04, 1951 Today's Date: 06/15/2019    History of Present Illness Pt is a 68 y/o female s/p L2-3, L3-4 bilateral decompression and fusion. PMH: HTN, arthritis, L4-5 decompresssion/fusion.     PT Comments    Patient seen for mobility progression. Pt presents with decreased activity tolerance and generalized weakness and requires assistance for OOB mobility. Pt needs cues for maintaining back precautions while performing mobility tasks and demonstrates unsafe use of AD especially when fatigued. Given pt's current mobility level recommend SNF for further skilled PT services to maximize independence and safety with mobility.     Follow Up Recommendations  SNF     Equipment Recommendations  Other (comment)(TBD next venue)    Recommendations for Other Services       Precautions / Restrictions Precautions Precautions: Fall;Back Precaution Comments: pt recalls 2/3 precautions Required Braces or Orthoses: Spinal Brace Spinal Brace: Lumbar corset;Applied in sitting position    Mobility  Bed Mobility               General bed mobility comments: pt sitting EOB upon arrival   Transfers Overall transfer level: Needs assistance Equipment used: Rolling walker (2 wheeled) Transfers: Sit to/from Stand Sit to Stand: Min guard         General transfer comment: cues for safe hand placement; min guard for safety   Ambulation/Gait Ambulation/Gait assistance: Min assist Gait Distance (Feet): 50 Feet Assistive device: Rolling walker (2 wheeled) Gait Pattern/deviations: Step-through pattern;Decreased step length - right;Decreased step length - left;Decreased dorsiflexion - right;Trunk flexed     General Gait Details: multimodal cues for upright posture and vc for safe use of AD; pt required multiple standing rest breaks and has tendency to lean on forearms despite cues for safe use of AD and back  precautions; poor R foot clearance    Stairs             Wheelchair Mobility    Modified Rankin (Stroke Patients Only)       Balance Overall balance assessment: Needs assistance Sitting-balance support: No upper extremity supported;Feet supported Sitting balance-Leahy Scale: Fair     Standing balance support: Bilateral upper extremity supported;No upper extremity supported;During functional activity Standing balance-Leahy Scale: Poor                              Cognition Arousal/Alertness: Awake/alert Behavior During Therapy: Flat affect Overall Cognitive Status: No family/caregiver present to determine baseline cognitive functioning                                 General Comments: pt with improved recall of precautions, but requires cueing to adhere to functionally; decreased safety and awareness of deficits       Exercises      General Comments        Pertinent Vitals/Pain Pain Assessment: Faces Faces Pain Scale: Hurts little more Pain Location: back and bilat shoulders Pain Descriptors / Indicators: Aching;Guarding;Sore Pain Intervention(s): Limited activity within patient's tolerance;Monitored during session;Repositioned    Home Living                      Prior Function            PT Goals (current goals can now be found in the care plan section) Acute Rehab PT Goals Patient  Stated Goal: go to rehab to get better Progress towards PT goals: Progressing toward goals    Frequency    Min 5X/week      PT Plan Discharge plan needs to be updated    Co-evaluation              AM-PAC PT "6 Clicks" Mobility   Outcome Measure  Help needed turning from your back to your side while in a flat bed without using bedrails?: A Lot Help needed moving from lying on your back to sitting on the side of a flat bed without using bedrails?: A Lot Help needed moving to and from a bed to a chair (including a  wheelchair)?: A Lot Help needed standing up from a chair using your arms (e.g., wheelchair or bedside chair)?: A Lot Help needed to walk in hospital room?: A Lot Help needed climbing 3-5 steps with a railing? : A Lot 6 Click Score: 12    End of Session Equipment Utilized During Treatment: Gait belt;Back brace Activity Tolerance: Patient tolerated treatment well Patient left: in chair;with call bell/phone within reach Nurse Communication: Mobility status;Other (comment)(NT called for chair alarm pad placement ) PT Visit Diagnosis: Difficulty in walking, not elsewhere classified (R26.2)     Time: 1532-1600 PT Time Calculation (min) (ACUTE ONLY): 28 min  Charges:  $Gait Training: 23-37 mins                     Hayley Padilla, PTA Acute Rehabilitation Services Pager: [phone] Office: [phone]     Hayley Padilla 06/15/2019, 4:37 PM

## 2019-06-15 NOTE — Discharge Summary (Addendum)
Physician Discharge Summary     Providing Compassionate, Quality Care - Together  Patient ID: Hayley Padilla MRN: [id number] DOB/AGE: 1951/04/26 68 y.o.  Admit date: 06/12/2019 Discharge date: 06/16/2019  Admission Diagnoses:  Degenerative spondylolisthesis  Discharge Diagnoses:  Same Active Problems:   Degenerative spondylolisthesis   Discharged Condition: Stable  Hospital Course:  Hayley Padilla is a 68 y.o. female who was admitted for the below procedure. There were no post operative complications. Patient worked with PT/OT who rec CIR vs SNF. Patient insurance denied CIR. Will need SNF for rehab.  At time of discharge, pain was well controlled, tolerating po, voiding normal. Ready for discharge to SNF.  Treatments: Surgery - L2-4 PLIF  Discharge Exam: Blood pressure 110/63, pulse 76, temperature 98.6 F (37 C), temperature source Oral, resp. rate 16, height 5\' 2"  (1.575 m), weight 83.1 kg, SpO2 100 %. Awake, alert, oriented Speech fluent, appropriate CN grossly intact 5/5 BUE/BLE, mild weakness left EHL Right foot numbness  Wound c/d/i  Disposition: Discharge disposition: 03-Skilled Nursing Facility       Discharge Instructions    Call MD for:  difficulty breathing, headache or visual disturbances   Complete by: As directed    Call MD for:  persistant dizziness or light-headedness   Complete by: As directed    Call MD for:  redness, tenderness, or signs of infection (pain, swelling, redness, odor or green/yellow discharge around incision site)   Complete by: As directed    Call MD for:  severe uncontrolled pain   Complete by: As directed    Call MD for:  temperature >100.4   Complete by: As directed    Diet general   Complete by: As directed    Driving Restrictions   Complete by: As directed    Do not drive until given clearance.   Increase activity slowly   Complete by: As directed    Lifting restrictions   Complete by: As directed    Do not lift  anything >10lbs. Avoid bending and twisting in awkward positions. Avoid bending at the back.   May shower / Bathe   Complete by: As directed    In 24 hours. Okay to wash wound with warm soapy water. Avoid scrubbing the wound. Pat dry.   Remove dressing in 24 hours   Complete by: As directed      Allergies as of 06/20/2019      Reactions   Penicillins Anaphylaxis   Did it involve swelling of the face/tongue/throat, SOB, or low BP? Yes Did it involve sudden or severe rash/hives, skin peeling, or any reaction on the inside of your mouth or nose? No Did you need to seek medical attention at a hospital or doctor's office? Yes When did it last happen?"Been a While"  If all above answers are "NO", may proceed with cephalosporin use.   Aspirin Other (See Comments)   brusing _ patient requested to be listed      Medication List    TAKE these medications   amLODipine 10 MG tablet Commonly known as: NORVASC Take 10 mg by mouth daily.   CALCIUM 600/VITAMIN D3 PO Take 1 tablet by mouth daily.   cloNIDine 0.1 MG tablet Commonly known as: CATAPRES Take 0.1 mg by mouth 2 (two) times daily.   diclofenac 75 MG EC tablet Commonly known as: VOLTAREN Take 1 tablet (75 mg total) by mouth 2 (two) times daily.   fluticasone 50 MCG/ACT nasal spray Commonly known as: FLONASE Place 1  spray into both nostrils daily as needed for allergies or rhinitis.   furosemide 20 MG tablet Commonly known as: LASIX Take 20 mg by mouth daily.   HYDROcodone-acetaminophen 5-325 MG tablet Commonly known as: NORCO/VICODIN Take 1 tablet by mouth every 6 (six) hours as needed for moderate pain.   losartan 100 MG tablet Commonly known as: COZAAR Take 100 mg by mouth daily.   lovastatin 40 MG tablet Commonly known as: MEVACOR Take 40 mg by mouth every evening.   methocarbamol 750 MG tablet Commonly known as: ROBAXIN Take 750 mg by mouth 2 (two) times a day.   potassium chloride 10 MEQ tablet Commonly  known as: K-DUR Take 20 mEq by mouth daily.            Durable Medical Equipment  (From admission, onward)         Start     Ordered   06/12/19 1405  DME Walker rolling  Once    Question:  Patient needs a walker to treat with the following condition  Answer:  Degenerative spondylolisthesis   06/12/19 1404   06/12/19 1405  DME 3 n 1  Once     06/12/19 1404         Follow-up Information    Julio SicksPool, Henry, MD Follow up.   Specialty: Neurosurgery Contact information: [address] [place] [address] [phone]           Signed: Val EagleMeghan , DNP, AGNP-C Nurse Practitioner  [facility] [address], [address], [place], [place] [address] P: [phone]    F: [phone]  06/20/2019 9:18 AM

## 2019-06-15 NOTE — Progress Notes (Signed)
Inpatient Rehab Admissions Coordinator:   Received notice from [facility] Medicare that pt's request for prior authorization for CIR was denied.  I discussed with PA and CSW need for SNF.  I will let patient know.   Shann Medal, PT, DPT Admissions Coordinator [phone] 06/15/19  10:34 AM

## 2019-06-15 NOTE — Progress Notes (Signed)
Patient transferred from [place] arrived in the unit at 2120pm, alert and oriented, c/o feeling mild pain on surgical site, situated in a bed comfortably, all safety and comfort measures are in placed, and will continue to monitor.

## 2019-06-15 NOTE — Progress Notes (Signed)
  NEUROSURGERY PROGRESS NOTE   No issues overnight.  Complains of appropriate back soreness although improving No other concerns this am  EXAM:  BP 127/64 (BP Location: Left Arm)   Pulse 95   Temp 98.7 F (37.1 C) (Oral)   Resp 18   Ht 5\' 2"  (1.575 m)   Wt 83.1 kg   SpO2 100%   BMI 33.51 kg/m   Awake, alert, oriented  Speech fluent, appropriate  CN grossly intact  MAEW with grossly normal strength, slight weakness L EHL and numbness right foot (baseline)  IMPRESSION/PLAN 68 y.o. female s/p L2-4 PLIF. Doing well - Dispo planning - Orders signed for d/c should patient be accepted to CIR today

## 2019-06-16 MED ORDER — OXYCODONE-ACETAMINOPHEN 7.5-325 MG PO TABS: 1.0000 | ORAL_TABLET | ORAL | 0 refills | Status: DC | PRN

## 2019-06-16 NOTE — Care Management Important Message (Signed)
Important Message  Patient Details  Name: RHYLAN GROSS MRN: [id number] Date of Birth: [birth year]   Medicare Important Message Given:  Yes     Orbie Pyo 06/16/2019, 2:32 PM

## 2019-06-16 NOTE — Progress Notes (Signed)
  NEUROSURGERY PROGRESS NOTE   No issues overnight.  No concerns this am  EXAM:  BP 127/65 (BP Location: Right Arm)   Pulse 78   Temp 98.2 F (36.8 C) (Oral)   Resp 15   Ht 5\' 2"  (1.575 m)   Wt 83.1 kg   SpO2 99%   BMI 33.51 kg/m   Awake, alert, oriented  Speech fluent, appropriate  CN grossly intact  5/5 BUE/BLE  Incision c/d/i  PLAN Stable dispo planning Cleared for d/c to SNF should bed be available

## 2019-06-16 NOTE — Plan of Care (Signed)
  Problem: Education: Goal: Ability to verbalize activity precautions or restrictions will improve Outcome: Progressing Goal: Knowledge of the prescribed therapeutic regimen will improve Outcome: Progressing Goal: Understanding of discharge needs will improve Outcome: Progressing   

## 2019-06-16 NOTE — TOC Progression Note (Signed)
Transition of Care [facility]) - Progression Note    Patient Details  Name: Hayley Padilla MRN: [id number] Date of Birth: [birth year]  Transition of Care [facility]) CM/SW [place], [place] Phone Number: 06/16/2019, 3:42 PM  Clinical Narrative:     The patient called [facility] because she was under the impression that no authorization had been started. The patient handed the phone to to [place]. CSW obtained permission to speak with the representative from [facility]. They denied her stay in CIR due to   Expected Discharge Plan: [place] Barriers to Discharge: Insurance Authorization  Expected Discharge Plan and Services Expected Discharge Plan: [facility] In-house Referral: Clinical Social Work Discharge Planning Services: NA Post Acute Care Choice: [place] Living arrangements for the past 2 months: Single Family Home Expected Discharge Date: 06/16/19                                     Social Determinants of Health (SDOH) Interventions    Readmission Risk Interventions No flowsheet data found.

## 2019-06-16 NOTE — Progress Notes (Signed)
CSW met with the patient at bedside. CSW shared with the patient that she had been denied by [facility] for inpatient rehab.   CSW provided bed offers to the patient. CSW explained that she would follow up with [facility], CIR to relay the information.   CSW explained that if the patient could not get into CIR then she would have to pick between SNF or going home with home health.   CSW will follow up with the patient.   Domenic Schwab, MSW, [place]

## 2019-06-16 NOTE — Progress Notes (Signed)
Physical Therapy Treatment Patient Details Name: GRACLYNN VANANTWERP MRN: [id number] DOB: 1951-10-22 Today's Date: 06/16/2019    History of Present Illness Pt is a 68 y/o female s/p L2-3, L3-4 bilateral decompression and fusion. PMH: HTN, arthritis, L4-5 decompresssion/fusion.     PT Comments    Patient seen for mobility progression. Pt is making progress toward PT goals. Pt reports less bilat shoulder pain with use of youth RW today. Current plan remains appropriate.   Follow Up Recommendations  SNF     Equipment Recommendations  Other (comment)(TBD next venue )    Recommendations for Other Services       Precautions / Restrictions Precautions Precautions: Fall;Back Precaution Comments: requires cues for all 3 precautions Required Braces or Orthoses: Spinal Brace Spinal Brace: Lumbar corset;Applied in sitting position Restrictions Weight Bearing Restrictions: No    Mobility  Bed Mobility               General bed mobility comments: pt in recliner upon arrival  Transfers Overall transfer level: Needs assistance Equipment used: Rolling walker (2 wheeled) Transfers: Sit to/from Stand Sit to Stand: Min guard         General transfer comment: cues for safe hand placement; min guard for safety   Ambulation/Gait Ambulation/Gait assistance: Min assist Gait Distance (Feet): 80 Feet Assistive device: Rolling walker (2 wheeled) Gait Pattern/deviations: Step-to pattern;Step-through pattern;Decreased dorsiflexion - right;Decreased stride length;Decreased weight shift to right;Decreased step length - left;Decreased step length - right;Trunk flexed   Gait velocity interpretation: <1.31 ft/sec, indicative of household ambulator General Gait Details: multimodal cues for upright posture and vc for safe use of AD; poor R LE coordiantion at times but no LOB   Chief Strategy Officer    Modified Rankin (Stroke Patients Only)       Balance Overall  balance assessment: Needs assistance Sitting-balance support: No upper extremity supported;Feet supported Sitting balance-Leahy Scale: Fair     Standing balance support: Bilateral upper extremity supported;No upper extremity supported;During functional activity Standing balance-Leahy Scale: Poor                              Cognition Arousal/Alertness: Awake/alert Behavior During Therapy: Flat affect Overall Cognitive Status: No family/caregiver present to determine baseline cognitive functioning                                 General Comments: Cues for proper techniques and to reduce bending at sink and reduce bending with mobility      Exercises      General Comments General comments (skin integrity, edema, etc.): Pt with increased pain, but performing tasks with increased motivation      Pertinent Vitals/Pain Pain Assessment: 0-10 Pain Score: 6  Pain Location: hips/pelvis and R LE Pain Descriptors / Indicators: Aching;Guarding;Sore;Radiating Pain Intervention(s): Limited activity within patient's tolerance;Monitored during session;Repositioned    Home Living Family/patient expects to be discharged to:: Skilled nursing facility Living Arrangements: Children;Other (Comment)           Home Equipment: Walker - 4 wheels;Bedside commode      Prior Function Level of Independence: Independent with assistive device(s)          PT Goals (current goals can now be found in the care plan section) Acute Rehab PT Goals Patient Stated Goal: go to rehab  to get better Progress towards PT goals: Progressing toward goals    Frequency    Min 5X/week      PT Plan Current plan remains appropriate    Co-evaluation              AM-PAC PT "6 Clicks" Mobility   Outcome Measure  Help needed turning from your back to your side while in a flat bed without using bedrails?: A Lot Help needed moving from lying on your back to sitting on the side  of a flat bed without using bedrails?: A Lot Help needed moving to and from a bed to a chair (including a wheelchair)?: A Little Help needed standing up from a chair using your arms (e.g., wheelchair or bedside chair)?: A Little Help needed to walk in hospital room?: A Little Help needed climbing 3-5 steps with a railing? : A Lot 6 Click Score: 15    End of Session Equipment Utilized During Treatment: Gait belt;Back brace Activity Tolerance: Patient tolerated treatment well Patient left: in chair;with call bell/phone within reach Nurse Communication: Mobility status PT Visit Diagnosis: Difficulty in walking, not elsewhere classified (R26.2)     Time: 1610-96041415-1431 PT Time Calculation (min) (ACUTE ONLY): 16 min  Charges:  $Gait Training: 8-22 mins                     Erline LevineKellyn Arlene Brickel, PTA Acute Rehabilitation Services Pager: [phone] Office: [phone]     Carolynne EdouardKellyn R Heron Pitcock 06/16/2019, 3:13 PM

## 2019-06-16 NOTE — Progress Notes (Signed)
Occupational Therapy Treatment Patient Details Name: Hayley Padilla MRN: [id number] DOB: 12/31/1950 Today's Date: 06/16/2019    History of present illness Pt is a 68 y/o female s/p L2-3, L3-4 bilateral decompression and fusion. PMH: HTN, arthritis, L4-5 decompresssion/fusion.    OT comments  Pt performing ADL functional mobility and transfers with minguardA with cues for standing upright with ambulating and at sink for light ADL. Pt prefers seated task for ADL at sink. Pt performing tasks with pain 5/10 after pain meds. Pt unable to properly care for self and would benefit from continued OT skilled services for ADL, mobility and energy conservation. OT following acutely. SNF recommended.     Follow Up Recommendations  SNF;Supervision - Intermittent    Equipment Recommendations  None recommended by OT    Recommendations for Other Services      Precautions / Restrictions Precautions Precautions: Fall;Back Precaution Comments: requires cues for all 3 precautions Required Braces or Orthoses: Spinal Brace Spinal Brace: Lumbar corset;Applied in sitting position Restrictions Weight Bearing Restrictions: No       Mobility Bed Mobility               General bed mobility comments: pt in recliner upon arrival  Transfers Overall transfer level: Needs assistance Equipment used: Rolling walker (2 wheeled) Transfers: Sit to/from Stand Sit to Stand: Min guard         General transfer comment: cues for safe hand placement; min guard for safety     Balance Overall balance assessment: Needs assistance Sitting-balance support: No upper extremity supported;Feet supported Sitting balance-Leahy Scale: Fair     Standing balance support: Bilateral upper extremity supported;No upper extremity supported;During functional activity Standing balance-Leahy Scale: Poor                             ADL either performed or assessed with clinical judgement   ADL Overall ADL's  : Needs assistance/impaired     Grooming: Min guard;Wash/dry hands;Wash/dry face;Oral care;Standing                   Toilet Transfer: Min guard;Ambulation;RW   Toileting- Clothing Manipulation and Hygiene: Min guard;Sitting/lateral lean;Sit to/from stand       Functional mobility during ADLs: Minimal assistance;Rolling walker;Cueing for safety;Cueing for sequencing General ADL Comments: cueing for posture, walker mgmt, safety, precautions      Vision Baseline Vision/History: Wears glasses Wears Glasses: Reading only Patient Visual Report: No change from baseline Vision Assessment?: No apparent visual deficits   Perception     Praxis      Cognition Arousal/Alertness: Awake/alert Behavior During Therapy: Flat affect Overall Cognitive Status: No family/caregiver present to determine baseline cognitive functioning                                 General Comments: Cues for proper techniques and to reduce bending at sink and reduce bending with mobility        Exercises     Shoulder Instructions       General Comments Pt with increased pain, but performing tasks with increased motivation    Pertinent Vitals/ Pain       Pain Assessment: 0-10 Pain Score: 5  Pain Location: back Pain Descriptors / Indicators: Aching;Guarding;Sore Pain Intervention(s): Monitored during session;Premedicated before session  Home Living Family/patient expects to be discharged to:: [place]: Children;Other (Comment)  Home Equipment: Walker - 4 wheels;Bedside commode          Prior Functioning/Environment Level of Independence: Independent with assistive device(s)            Frequency  Min 2X/week        Progress Toward Goals  OT Goals(current goals can now be found in the care plan section)     Acute Rehab OT Goals Patient Stated Goal: go to rehab to get better OT Goal  Formulation: With patient Time For Goal Achievement: 06/27/19 Potential to Achieve Goals: Good  Plan      Co-evaluation                 AM-PAC OT "6 Clicks" Daily Activity     Outcome Measure   Help from another person eating meals?: None Help from another person taking care of personal grooming?: A Little Help from another person toileting, which includes using toliet, bedpan, or urinal?: A Little Help from another person bathing (including washing, rinsing, drying)?: A Lot Help from another person to put on and taking off regular upper body clothing?: A Little Help from another person to put on and taking off regular lower body clothing?: A Lot 6 Click Score: 17    End of Session Equipment Utilized During Treatment: Gait belt;Rolling walker;Back brace  OT Visit Diagnosis: Other abnormalities of gait and mobility (R26.89);Muscle weakness (generalized) (M62.81);Pain   Activity Tolerance Patient tolerated treatment well   Patient Left in chair;with call bell/phone within reach   Nurse Communication Mobility status        Time: [id number] OT Time Calculation (min): 23 min  Charges: OT General Charges $OT Visit: 1 Visit OT Treatments $Self Care/Home Management : 8-22 mins $Neuromuscular Re-education: 8-22 mins  Hayley Padilla OTR/L Acute Rehabilitation Services Pager: [phone] Office: [phone]    Hayley Padilla 06/16/2019, 2:15 PM

## 2019-06-17 MED ORDER — HEPARIN SODIUM (PORCINE) 5000 UNIT/ML IJ SOLN
5000.0000 [IU] | Freq: Three times a day (TID) | INTRAMUSCULAR | Status: DC
  Administered 2019-06-17 – 2019-06-20 (×8): 5000 [IU] via SUBCUTANEOUS
  Filled 2019-06-17 (×8): qty 1

## 2019-06-17 MED ORDER — MAGNESIUM CITRATE PO SOLN
1.0000 | Freq: Once | ORAL | Status: AC
  Administered 2019-06-17: 18:00:00 1 via ORAL
  Filled 2019-06-17: qty 296

## 2019-06-17 NOTE — Progress Notes (Signed)
Physical Therapy Treatment Patient Details Name: Hayley Padilla MRN: [id number] DOB: Jan 06, 1951 Today's Date: 06/17/2019    History of Present Illness Pt is a 68 y/o female s/p L2-3, L3-4 bilateral decompression and fusion. PMH: HTN, arthritis, L4-5 decompresssion/fusion.     PT Comments    Pt did not move as well today, possibly due to fatigue from not sleeping well last night. Continue to recommend SNF for ongoing Physical Therapy.     Follow Up Recommendations  SNF     Equipment Recommendations       Recommendations for Other Services       Precautions / Restrictions Precautions Precautions: Fall;Back Precaution Booklet Issued: Yes (comment) Precaution Comments: requires cues for all 3 precautions Required Braces or Orthoses: Spinal Brace Spinal Brace: Lumbar corset;Applied in sitting position Restrictions Weight Bearing Restrictions: No    Mobility  Bed Mobility Overal bed mobility: (NT, pt up in recliner and did not want to return to bed )                Transfers Overall transfer level: Needs assistance Equipment used: Rolling walker (2 wheeled) Transfers: Sit to/from Stand Sit to Stand: Min guard         General transfer comment: cues for hand placement  Ambulation/Gait Ambulation/Gait assistance: Min assist Gait Distance (Feet): 15 Feet(also walked 10 additional feet after seated rest break) Assistive device: Rolling walker (2 wheeled) Gait Pattern/deviations: Step-to pattern;Decreased stride length   Gait velocity interpretation: <1.31 ft/sec, indicative of household ambulator General Gait Details: Right foot tends to cross midline and touch LLE. Multimodal cues for upright posture, and still unable to achieve. VC for safe use of AD-tends to push it too far forward; poor RLE coordiantion     Social research officer, governmenttairs             Wheelchair Mobility    Modified Rankin (Stroke Patients Only)       Balance                                            Cognition Arousal/Alertness: Awake/alert Behavior During Therapy: Flat affect Overall Cognitive Status: Within Functional Limits for tasks assessed                                        Exercises      General Comments General comments (skin integrity, edema, etc.): Pt groggy during session. She reports she did not sleep well last night. She couldn't keep eyes open at end of session to converse weith me and would fall asleep mid-sentence.      Pertinent Vitals/Pain Pain Assessment: 0-10 Pain Score: 6  Pain Location: low back Pain Descriptors / Indicators: Sore Pain Intervention(s): Limited activity within patient's tolerance;Monitored during session    Home Living                      Prior Function            PT Goals (current goals can now be found in the care plan section)      Frequency           PT Plan      Co-evaluation              AM-PAC PT "6 Clicks"  Mobility   Outcome Measure  Help needed turning from your back to your side while in a flat bed without using bedrails?: A Lot Help needed moving from lying on your back to sitting on the side of a flat bed without using bedrails?: A Lot Help needed moving to and from a bed to a chair (including a wheelchair)?: A Little Help needed standing up from a chair using your arms (e.g., wheelchair or bedside chair)?: A Little Help needed to walk in hospital room?: A Little Help needed climbing 3-5 steps with a railing? : A Lot 6 Click Score: 15    End of Session Equipment Utilized During Treatment: Gait belt;Back brace Activity Tolerance: Patient limited by lethargy Patient left: in chair;with call bell/phone within reach   PT Visit Diagnosis: Difficulty in walking, not elsewhere classified (R26.2)     Time: 1751-0258 PT Time Calculation (min) (ACUTE ONLY): 26 min  Charges:  $Gait Training: 23-37 mins                     Lavonia Dana, [place]  Pager [phone] Office [phone] 06/17/2019    Hayley Padilla 06/17/2019, 10:39 AM

## 2019-06-17 NOTE — Progress Notes (Signed)
Neurosurgery Service Progress Note  Subjective: No acute events overnight, no new complaints  Objective: Vitals:   06/16/19 2339 06/17/19 0504 06/17/19 0744 06/17/19 1148  BP: (!) 108/57 105/60 100/65 125/88  Pulse: 80 74 75 73  Resp: 15 16 15 15   Temp: 98.6 F (37 C) 98 F (36.7 C) 98 F (36.7 C) 98.6 F (37 C)  TempSrc: Oral  Oral Oral  SpO2: 100% 100% 100% 95%  Weight:      Height:       Temp (24hrs), Avg:98.4 F (36.9 C), Min:98 F (36.7 C), Max:99 F (37.2 C)  CBC Latest Ref Rng & Units 06/13/2019 06/07/2019  WBC 4.0 - 10.5 K/uL 13.0(H) 5.5  Hemoglobin 12.0 - 15.0 g/dL 8.7(L) 11.6(L)  Hematocrit 36.0 - 46.0 % 27.5(L) 37.5  Platelets 150 - 400 K/uL 206 308   BMP Latest Ref Rng & Units 06/07/2019  Glucose 70 - 99 mg/dL 136(H)  BUN 8 - 23 mg/dL 11  Creatinine 0.44 - 1.00 mg/dL 0.62  Sodium 135 - 145 mmol/L 142  Potassium 3.5 - 5.1 mmol/L 3.8  Chloride 98 - 111 mmol/L 108  CO2 22 - 32 mmol/L 26  Calcium 8.9 - 10.3 mg/dL 9.1    Intake/Output Summary (Last 24 hours) at 06/17/2019 1153 Last data filed at 06/16/2019 2110 Gross per 24 hour  Intake 3 ml  Output -  Net 3 ml    Current Facility-Administered Medications:  .  0.9 %  sodium chloride infusion, 250 mL, Intravenous, Continuous, Pool, Henry, MD .  acetaminophen (TYLENOL) tablet 650 mg, 650 mg, Oral, Q4H PRN, 650 mg at 06/14/19 0007 **OR** acetaminophen (TYLENOL) suppository 650 mg, 650 mg, Rectal, Q4H PRN, Earnie Larsson, MD .  amLODipine (NORVASC) tablet 10 mg, 10 mg, Oral, Daily, Pool, Mallie Mussel, MD, 10 mg at 06/17/19 1035 .  bisacodyl (DULCOLAX) suppository 10 mg, 10 mg, Rectal, Daily PRN, Earnie Larsson, MD .  calcium-vitamin D (OSCAL WITH D) 500-200 MG-UNIT per tablet 1 tablet, 1 tablet, Oral, Q breakfast, Earnie Larsson, MD, 1 tablet at 06/17/19 1035 .  cloNIDine (CATAPRES) tablet 0.1 mg, 0.1 mg, Oral, BID, Pool, Mallie Mussel, MD, 0.1 mg at 06/17/19 1035 .  diazepam (VALIUM) tablet 5-10 mg, 5-10 mg, Oral, Q6H PRN, Earnie Larsson,  MD, 10 mg at 06/17/19 0441 .  fluticasone (FLONASE) 50 MCG/ACT nasal spray 1 spray, 1 spray, Each Nare, Daily PRN, Pool, Mallie Mussel, MD .  furosemide (LASIX) tablet 20 mg, 20 mg, Oral, Daily, Pool, Mallie Mussel, MD, 20 mg at 06/17/19 1036 .  HYDROcodone-acetaminophen (NORCO) 10-325 MG per tablet 1 tablet, 1 tablet, Oral, Q4H PRN, Earnie Larsson, MD, 1 tablet at 06/16/19 2110 .  HYDROmorphone (DILAUDID) injection 1 mg, 1 mg, Intravenous, Q2H PRN, Earnie Larsson, MD, 1 mg at 06/12/19 2020 .  hydrOXYzine (VISTARIL) injection 50 mg, 50 mg, Intramuscular, Q6H PRN, Earnie Larsson, MD, 50 mg at 06/13/19 0002 .  losartan (COZAAR) tablet 100 mg, 100 mg, Oral, Daily, Pool, Mallie Mussel, MD, 100 mg at 06/17/19 1035 .  menthol-cetylpyridinium (CEPACOL) lozenge 3 mg, 1 lozenge, Oral, PRN **OR** phenol (CHLORASEPTIC) mouth spray 1 spray, 1 spray, Mouth/Throat, PRN, Pool, Henry, MD .  ondansetron (ZOFRAN) tablet 4 mg, 4 mg, Oral, Q6H PRN **OR** ondansetron (ZOFRAN) injection 4 mg, 4 mg, Intravenous, Q6H PRN, Earnie Larsson, MD, 4 mg at 06/13/19 0530 .  oxyCODONE (Oxy IR/ROXICODONE) immediate release tablet 10 mg, 10 mg, Oral, Q3H PRN, Earnie Larsson, MD, 10 mg at 06/17/19 1036 .  polyethylene glycol (MIRALAX / GLYCOLAX)  packet 17 g, 17 g, Oral, Daily PRN, Julio SicksPool, Henry, MD, 17 g at 06/16/19 1640 .  potassium chloride SA (K-DUR) CR tablet 20 mEq, 20 mEq, Oral, Daily, Pool, Sherilyn CooterHenry, MD, 20 mEq at 06/17/19 1036 .  pravastatin (PRAVACHOL) tablet 40 mg, 40 mg, Oral, q1800, Julio SicksPool, Henry, MD, 40 mg at 06/16/19 1634 .  sodium chloride flush (NS) 0.9 % injection 3 mL, 3 mL, Intravenous, Q12H, Pool, Sherilyn CooterHenry, MD, 3 mL at 06/17/19 1125 .  sodium chloride flush (NS) 0.9 % injection 3 mL, 3 mL, Intravenous, PRN, Pool, Sherilyn CooterHenry, MD .  sodium phosphate (FLEET) 7-19 GM/118ML enema 1 enema, 1 enema, Rectal, Once PRN, Julio SicksPool, Henry, MD   Physical Exam: AOx3, PERRL, EOMI, FS, Strength 5/5 except for 4/5 in L EHL, R S1 distribution numbness  Assessment & Plan: 68 y.o. woman  s/p 2 level PLIF, recovering well.  -SNF transfer pending -SCDs/TEDs/SQH  Jadene Pierinihomas A Ida Milbrath  06/17/19 11:53 AM

## 2019-06-18 NOTE — Plan of Care (Signed)
Pt doing well. She ambulates slowly, but fairly steadily throughout her room. Pain medication x's two overnight. No BM overnight even with mag citrate. M.Bralen Wiltgen,RN

## 2019-06-18 NOTE — Progress Notes (Signed)
Neurosurgery Service Progress Note  Subjective: No acute events overnight, no new complaints  Objective: Vitals:   06/17/19 1655 06/17/19 2031 06/18/19 0032 06/18/19 0429  BP: (!) 108/52 (!) 121/58 109/63 121/69  Pulse: 72 81 87 83  Resp: 15 16 16 16   Temp: 98.7 F (37.1 C) 98.6 F (37 C) 98.7 F (37.1 C) 98.5 F (36.9 C)  TempSrc: Oral Oral Oral Oral  SpO2: 99% 99% 99% 99%  Weight:      Height:       Temp (24hrs), Avg:98.5 F (36.9 C), Min:98 F (36.7 C), Max:98.7 F (37.1 C)  CBC Latest Ref Rng & Units 06/13/2019 06/07/2019  WBC 4.0 - 10.5 K/uL 13.0(H) 5.5  Hemoglobin 12.0 - 15.0 g/dL 8.7(L) 11.6(L)  Hematocrit 36.0 - 46.0 % 27.5(L) 37.5  Platelets 150 - 400 K/uL 206 308   BMP Latest Ref Rng & Units 06/07/2019  Glucose 70 - 99 mg/dL 136(H)  BUN 8 - 23 mg/dL 11  Creatinine 0.44 - 1.00 mg/dL 0.62  Sodium 135 - 145 mmol/L 142  Potassium 3.5 - 5.1 mmol/L 3.8  Chloride 98 - 111 mmol/L 108  CO2 22 - 32 mmol/L 26  Calcium 8.9 - 10.3 mg/dL 9.1   No intake or output data in the 24 hours ending 06/18/19 0522  Current Facility-Administered Medications:  .  0.9 %  sodium chloride infusion, 250 mL, Intravenous, Continuous, Pool, Henry, MD .  acetaminophen (TYLENOL) tablet 650 mg, 650 mg, Oral, Q4H PRN, 650 mg at 06/14/19 0007 **OR** acetaminophen (TYLENOL) suppository 650 mg, 650 mg, Rectal, Q4H PRN, Earnie Larsson, MD .  amLODipine (NORVASC) tablet 10 mg, 10 mg, Oral, Daily, Pool, Mallie Mussel, MD, 10 mg at 06/17/19 1035 .  bisacodyl (DULCOLAX) suppository 10 mg, 10 mg, Rectal, Daily PRN, Earnie Larsson, MD .  calcium-vitamin D (OSCAL WITH D) 500-200 MG-UNIT per tablet 1 tablet, 1 tablet, Oral, Q breakfast, Earnie Larsson, MD, 1 tablet at 06/17/19 1035 .  cloNIDine (CATAPRES) tablet 0.1 mg, 0.1 mg, Oral, BID, Earnie Larsson, MD, 0.1 mg at 06/17/19 2148 .  diazepam (VALIUM) tablet 5-10 mg, 5-10 mg, Oral, Q6H PRN, Earnie Larsson, MD, 10 mg at 06/18/19 0409 .  fluticasone (FLONASE) 50 MCG/ACT nasal  spray 1 spray, 1 spray, Each Nare, Daily PRN, Pool, Mallie Mussel, MD .  furosemide (LASIX) tablet 20 mg, 20 mg, Oral, Daily, Pool, Henry, MD, 20 mg at 06/17/19 1036 .  heparin injection 5,000 Units, 5,000 Units, Subcutaneous, Q8H, Judith Part, MD, 5,000 Units at 06/17/19 2149 .  HYDROcodone-acetaminophen (NORCO) 10-325 MG per tablet 1 tablet, 1 tablet, Oral, Q4H PRN, Earnie Larsson, MD, 1 tablet at 06/16/19 2110 .  HYDROmorphone (DILAUDID) injection 1 mg, 1 mg, Intravenous, Q2H PRN, Earnie Larsson, MD, 1 mg at 06/12/19 2020 .  hydrOXYzine (VISTARIL) injection 50 mg, 50 mg, Intramuscular, Q6H PRN, Earnie Larsson, MD, 50 mg at 06/13/19 0002 .  losartan (COZAAR) tablet 100 mg, 100 mg, Oral, Daily, Pool, Mallie Mussel, MD, 100 mg at 06/17/19 1035 .  menthol-cetylpyridinium (CEPACOL) lozenge 3 mg, 1 lozenge, Oral, PRN **OR** phenol (CHLORASEPTIC) mouth spray 1 spray, 1 spray, Mouth/Throat, PRN, Pool, Henry, MD .  ondansetron (ZOFRAN) tablet 4 mg, 4 mg, Oral, Q6H PRN **OR** ondansetron (ZOFRAN) injection 4 mg, 4 mg, Intravenous, Q6H PRN, Earnie Larsson, MD, 4 mg at 06/13/19 0530 .  oxyCODONE (Oxy IR/ROXICODONE) immediate release tablet 10 mg, 10 mg, Oral, Q3H PRN, Earnie Larsson, MD, 10 mg at 06/18/19 0124 .  polyethylene glycol (MIRALAX / GLYCOLAX)  packet 17 g, 17 g, Oral, Daily PRN, Julio SicksPool, Henry, MD, 17 g at 06/16/19 1640 .  potassium chloride SA (K-DUR) CR tablet 20 mEq, 20 mEq, Oral, Daily, Pool, Sherilyn CooterHenry, MD, 20 mEq at 06/17/19 1036 .  pravastatin (PRAVACHOL) tablet 40 mg, 40 mg, Oral, q1800, Julio SicksPool, Henry, MD, 40 mg at 06/17/19 1825 .  sodium chloride flush (NS) 0.9 % injection 3 mL, 3 mL, Intravenous, Q12H, Pool, Sherilyn CooterHenry, MD, 3 mL at 06/17/19 1125 .  sodium chloride flush (NS) 0.9 % injection 3 mL, 3 mL, Intravenous, PRN, Pool, Sherilyn CooterHenry, MD .  sodium phosphate (FLEET) 7-19 GM/118ML enema 1 enema, 1 enema, Rectal, Once PRN, Julio SicksPool, Henry, MD   Physical Exam: AOx3, PERRL, EOMI, FS, Strength 5/5 except for 4/5 in L EHL, R S1  distribution numbness  Assessment & Plan: 68 y.o. woman s/p 2 level PLIF, recovering well.  -SNF transfer pending, will d/w SW tomorrow for update on timing -SCDs/TEDs/SQH  Jadene Pierinihomas A Leetta Hendriks  06/18/19 5:22 AM

## 2019-06-19 LAB — SARS CORONAVIRUS 2 BY RT PCR (HOSPITAL ORDER, PERFORMED IN ~~LOC~~ HOSPITAL LAB): SARS Coronavirus 2: NEGATIVE

## 2019-06-19 MED ORDER — BISACODYL 10 MG RE SUPP: 10.0000 mg | Freq: Every day | RECTAL | Status: DC | PRN

## 2019-06-19 MED ORDER — DOCUSATE SODIUM 100 MG PO CAPS
100.0000 mg | ORAL_CAPSULE | Freq: Two times a day (BID) | ORAL | Status: DC
  Administered 2019-06-19 – 2019-06-20 (×2): 100 mg via ORAL
  Filled 2019-06-19 (×2): qty 1

## 2019-06-19 NOTE — Progress Notes (Signed)
TOC spoke to Hayley Padilla at [facility] and she states they are still awaiting auth for SNF rehab. Covid results back and negative. TOC following for d/c tomorrow to [facility].

## 2019-06-19 NOTE — Care Management Important Message (Signed)
Important Message  Patient Details  Name: Hayley Padilla MRN: [id number] Date of Birth: [birth year]   Medicare Important Message Given:  Yes     Orbie Pyo 06/19/2019, 2:35 PM

## 2019-06-19 NOTE — Progress Notes (Signed)
Physical Therapy Treatment Patient Details Name: Hayley Padilla L Hogenson MRN: [id number] DOB: 09-13-1951 Today's Date: 06/19/2019    History of Present Illness Pt is a 68 y/o female s/p L2-3, L3-4 bilateral decompression and fusion. PMH: HTN, arthritis, L4-5 decompresssion/fusion.     PT Comments    Pt was able to progress gait into the hallway, but fatigues quickly, leaning flexed over her RW more and more as she fatigues.  Log roll and sleeping positions for back reviewed.  Pt continues to remain appropriate for SNF level rehab at discharge.  PT will continue to follow acutely for safe mobility progression   Follow Up Recommendations  SNF     Equipment Recommendations  Rolling walker with 5" wheels;3in1 (PT)    Recommendations for Other Services   NA     Precautions / Restrictions Precautions Precautions: Fall;Back Precaution Booklet Issued: Yes (comment) Required Braces or Orthoses: Spinal Brace Spinal Brace: Lumbar corset;Applied in sitting position    Mobility  Bed Mobility Overal bed mobility: Needs Assistance Bed Mobility: Rolling;Sidelying to Sit;Sit to Sidelying Rolling: Min assist Sidelying to sit: Min assist     Sit to sidelying: Min assist General bed mobility comments: Min assist to help support trunk and legs during transitions.  Cues for log roll and reverse log roll technique to get into and out of bed.   Transfers Overall transfer level: Needs assistance Equipment used: Rolling walker (2 wheeled) Transfers: Sit to/from Stand Sit to Stand: Min guard         General transfer comment: Min guard assist for safety and sa  Ambulation/Gait Ambulation/Gait assistance: Min guard Gait Distance (Feet): 75 Feet Assistive device: Rolling walker (2 wheeled) Gait Pattern/deviations: Step-through pattern;Trunk flexed Gait velocity: decreased   General Gait Details: Pt with progressively flexing trunk, cues for upright posture and to keep bil LEs inside of the RW.             Balance Overall balance assessment: Needs assistance Sitting-balance support: Feet supported;No upper extremity supported Sitting balance-Leahy Scale: Good     Standing balance support: Bilateral upper extremity supported Standing balance-Leahy Scale: Poor Standing balance comment: needs external support from RW in standing.                             Cognition Arousal/Alertness: Awake/alert Behavior During Therapy: WFL for tasks assessed/performed Overall Cognitive Status: Within Functional Limits for tasks assessed                                 General Comments: not specifically tested             Pertinent Vitals/Pain Pain Assessment: Faces Faces Pain Scale: Hurts little more Pain Location: low back Pain Descriptors / Indicators: Aching Pain Intervention(s): Monitored during session;Limited activity within patient's tolerance;Repositioned       Prior Function            PT Goals (current goals can now be found in the care plan section) Acute Rehab PT Goals Patient Stated Goal: go to rehab to get better Progress towards PT goals: Progressing toward goals    Frequency    Min 5X/week      PT Plan Current plan remains appropriate       AM-PAC PT "6 Clicks" Mobility   Outcome Measure  Help needed turning from your back to your side while in a flat bed  without using bedrails?: A Little Help needed moving from lying on your back to sitting on the side of a flat bed without using bedrails?: A Little Help needed moving to and from a bed to a chair (including a wheelchair)?: A Little Help needed standing up from a chair using your arms (e.g., wheelchair or bedside chair)?: A Little Help needed to walk in hospital room?: A Little Help needed climbing 3-5 steps with a railing? : A Little 6 Click Score: 18    End of Session Equipment Utilized During Treatment: Back brace Activity Tolerance: Patient limited by  pain;Patient limited by fatigue Patient left: in bed;with call bell/phone within reach;with bed alarm set   PT Visit Diagnosis: Difficulty in walking, not elsewhere classified (R26.2)     Time: 1100-1116 PT Time Calculation (min) (ACUTE ONLY): 16 min  Charges:  $Gait Training: 8-22 mins          Fallon Haecker B. Stephie Xu, PT, DPT  Acute Rehabilitation [phone] pager [phone] office  @ [place]: [phone]             06/19/2019, 11:55 AM

## 2019-06-19 NOTE — Progress Notes (Signed)
   Providing Compassionate, Quality Care - Together   Subjective: Patient reports no issues overnight. Reports she was just swabbed for a new COVID-19 test.  Objective: Vital signs in last 24 hours: Temp:  [97.6 F (36.4 C)-99.7 F (37.6 C)] 98.1 F (36.7 C) (07/06 1226) Pulse Rate:  [76-87] 87 (07/06 1226) Resp:  [17-18] 18 (07/06 1226) BP: (109-159)/(50-80) 159/77 (07/06 1226) SpO2:  [98 %-100 %] 100 % (07/06 1226)  Intake/Output from previous day: 07/05 0701 - 07/06 0700 In: 360 [P.O.:360] Out: -  Intake/Output this shift: Total I/O In: 240 [P.O.:240] Out: -   Alert and oriented x 4 MAE,  Strength 5/5 BUE, RLE 4/5 LLE Incision clean, dry, and intact   Lab Results: No results for input(s): WBC, HGB, HCT, PLT in the last 72 hours. BMET No results for input(s): NA, K, CL, CO2, GLUCOSE, BUN, CREATININE, CALCIUM in the last 72 hours.  Studies/Results: No results found.  Assessment/Plan: Patient is 7 days s/p L2-3 and L3-4 decompression and posterior lumbar interbody fusion. She has worked with therapies, who initially recommended CIR. However, insurance denied CIR. She is now awaiting placement at SNF.   LOS: 7 days   -Awaiting placement at SNF -Continue to work with therapies.   Viona Gilmore, DNP, AGNP-C Nurse Practitioner  [facility] [place] [address], [place], [place], [place] [address] P: [phone]    F: [phone]  06/19/2019, 12:30 PM

## 2019-06-20 MED ORDER — HYDROCODONE-ACETAMINOPHEN 5-325 MG PO TABS: 1.0000 | ORAL_TABLET | Freq: Four times a day (QID) | ORAL | 0 refills | Status: AC | PRN

## 2019-06-20 NOTE — Progress Notes (Signed)
Pt d/c to [facility] health care. Pt has no new concerns. Alert and oriented x4. D/c instructions and report given to Charlene RN at the facility. Pt will be transported out of the hospital by [place].

## 2019-06-20 NOTE — TOC Transition Note (Signed)
Transition of Care [facility]) - CM/SW Discharge Note   Patient Details  Name: Hayley Padilla MRN: [id number] Date of Birth: 30-Nov-1951  Transition of Care [facility]) CM/SW Contact:  Pollie Friar, RN Phone Number: 06/20/2019, 1:17 PM   Clinical Narrative:    Pt is discharging to [facility] today. CM offered to contact her family but patient stated she would call her daughter.  Pt is going to room: 101A Number for report: [phone]  [facility] to transport the patient. Transport form at the desk. Bedside RN updated.   Final next level of care: [place] Barriers to Discharge: No Barriers Identified   Patient Goals and CMS Choice Patient states their goals for this hospitalization and ongoing recovery are:: Pt is agreeable to going to [place] Medicare.gov Compare Post Acute Care list provided to:: Patient Choice offered to / list presented to : Patient  Discharge Placement              Patient chooses bed at: [facility] Patient to be transferred to facility by: [place] Name of family member notified: patient to call her daughter Patient and family notified of of transfer: 06/20/19  Discharge Plan and Services In-house Referral: Clinical Social Work Discharge Planning Services: NA Post Acute Care Choice: [place]                               Social Determinants of Health (SDOH) Interventions     Readmission Risk Interventions No flowsheet data found.

## 2019-12-13 ENCOUNTER — Other Ambulatory Visit: Payer: Self-pay

## 2019-12-13 ENCOUNTER — Ambulatory Visit
Admission: RE | Admit: 2019-12-13 | Discharge: 2019-12-13 | Disposition: A | Discharge status: Home / Self Care | Payer: Medicare Other | Source: Ambulatory Visit | Attending: Internal Medicine | Admitting: Internal Medicine

## 2019-12-13 DIAGNOSIS — Z1231 Encounter for screening mammogram for malignant neoplasm of breast: Secondary | ICD-10-CM

## 2020-02-06 ENCOUNTER — Encounter: Payer: Self-pay | Admitting: Gastroenterology

## 2020-03-04 ENCOUNTER — Ambulatory Visit (AMBULATORY_SURGERY_CENTER): Payer: Self-pay | Admitting: *Deleted

## 2020-03-04 ENCOUNTER — Other Ambulatory Visit: Payer: Self-pay

## 2020-03-04 VITALS — Temp 98.2°F | Ht 62.0 in | Wt 206.0 lb

## 2020-03-04 DIAGNOSIS — Z01818 Encounter for other preprocedural examination: Secondary | ICD-10-CM

## 2020-03-04 DIAGNOSIS — Z1211 Encounter for screening for malignant neoplasm of colon: Secondary | ICD-10-CM

## 2020-03-04 NOTE — Progress Notes (Signed)
No egg or soy allergy known to patient  No issues with past sedation with any surgeries  or procedures, no intubation problems  No diet pills per patient No home 02 use per patient  No blood thinners per patient  Pt denies issues with constipation  No A fib or A flutter  EMMI video sent to pt's e mail    Colo guard was negative 3 yrs ago per pt-   Due to the COVID-19 pandemic we are asking patients to follow these guidelines. Please only bring one care partner. Please be aware that your care partner may wait in the car in the parking lot or if they feel like they will be too hot to wait in the car, they may wait in the lobby on the 4th floor. All care partners are required to wear a mask the entire time (we do not have any that we can provide them), they need to practice social distancing, and we will do a Covid check for all patient's and care partners when you arrive. Also we will check their temperature and your temperature. If the care partner waits in their car they need to stay in the parking lot the entire time and we will call them on their cell phone when the patient is ready for discharge so they can bring the car to the front of the building. Also all patient's will need to wear a mask into building.

## 2020-03-13 ENCOUNTER — Other Ambulatory Visit: Payer: Self-pay | Admitting: Gastroenterology

## 2020-03-13 ENCOUNTER — Ambulatory Visit (INDEPENDENT_AMBULATORY_CARE_PROVIDER_SITE_OTHER): Payer: Medicare Other

## 2020-03-13 DIAGNOSIS — Z1159 Encounter for screening for other viral diseases: Secondary | ICD-10-CM

## 2020-03-13 LAB — SARS CORONAVIRUS 2 (TAT 6-24 HRS): SARS Coronavirus 2: NEGATIVE

## 2020-03-14 ENCOUNTER — Encounter: Payer: Self-pay | Admitting: Gastroenterology

## 2020-03-18 ENCOUNTER — Other Ambulatory Visit: Payer: Self-pay

## 2020-03-18 ENCOUNTER — Encounter: Payer: Self-pay | Admitting: Gastroenterology

## 2020-03-18 ENCOUNTER — Ambulatory Visit (AMBULATORY_SURGERY_CENTER): Payer: Medicare Other | Admitting: Gastroenterology

## 2020-03-18 VITALS — BP 129/67 | HR 61 | Temp 97.1°F | Resp 12 | Ht 62.0 in | Wt 206.0 lb

## 2020-03-18 DIAGNOSIS — Z538 Procedure and treatment not carried out for other reasons: Secondary | ICD-10-CM | POA: Diagnosis not present

## 2020-03-18 DIAGNOSIS — Z1211 Encounter for screening for malignant neoplasm of colon: Secondary | ICD-10-CM

## 2020-03-18 MED ORDER — SODIUM CHLORIDE 0.9 % IV SOLN: 500.0000 mL | Freq: Once | INTRAVENOUS | Status: DC

## 2020-03-18 NOTE — Progress Notes (Signed)
Patient had breakfast yesterday morning with fried bacon and toast.  Also had candy last night.  States her last BM was brown/cloudy.  Dr. Orvan Falconer is aware.

## 2020-03-18 NOTE — Patient Instructions (Addendum)
Appointment with Dr. Myrtie Neither on 03-19-2020 at 9am, please arrive at our facility at 8am. See instructions provided.  YOU HAD AN ENDOSCOPIC PROCEDURE TODAY AT THE [facility]:   Refer to the procedure report that was given to you for any specific questions about what was found during the examination.  If the procedure report does not answer your questions, please call your gastroenterologist to clarify.  If you requested that your care partner not be given the details of your procedure findings, then the procedure report has been included in a sealed envelope for you to review at your convenience later.  YOU SHOULD EXPECT: Some feelings of bloating in the abdomen. Passage of more gas than usual.  Walking can help get rid of the air that was put into your GI tract during the procedure and reduce the bloating. If you had a lower endoscopy (such as a colonoscopy or flexible sigmoidoscopy) you may notice spotting of blood in your stool or on the toilet paper. If you underwent a bowel prep for your procedure, you may not have a normal bowel movement for a few days.  Please Note:  You might notice some irritation and congestion in your nose or some drainage.  This is from the oxygen used during your procedure.  There is no need for concern and it should clear up in a day or so.  SYMPTOMS TO REPORT IMMEDIATELY:   Following lower endoscopy (colonoscopy or flexible sigmoidoscopy):  Excessive amounts of blood in the stool  Significant tenderness or worsening of abdominal pains  Swelling of the abdomen that is new, acute  Fever of 100F or higher   For urgent or emergent issues, a gastroenterologist can be reached at any hour by calling [phone]. Do not use MyChart messaging for urgent concerns.    DIET:  See procedure instructions.   ACTIVITY:  You should plan to take it easy for the rest of today and you should NOT DRIVE or use heavy machinery until tomorrow (because of the sedation  medicines used during the test).    FOLLOW UP: Our staff will call the number listed on your records 48-72 hours following your procedure to check on you and address any questions or concerns that you may have regarding the information given to you following your procedure. If we do not reach you, we will leave a message.  We will attempt to reach you two times.  During this call, we will ask if you have developed any symptoms of COVID 19. If you develop any symptoms (ie: fever, flu-like symptoms, shortness of breath, cough etc.) before then, please call [phone].  If you test positive for Covid 19 in the 2 weeks post procedure, please call and report this information to [place].    If any biopsies were taken you will be contacted by phone or by letter within the next 1-3 weeks.  Please call us at [phone] if you have not heard about the biopsies in 3 weeks.    SIGNATURES/CONFIDENTIALITY: You and/or your care partner have signed paperwork which will be entered into your electronic medical record.  These signatures attest to the fact that that the information above on your After Visit Summary has been reviewed and is understood.  Full responsibility of the confidentiality of this discharge information lies with you and/or your care-partner.

## 2020-03-18 NOTE — Progress Notes (Signed)
CW- vitals JB- temp 

## 2020-03-18 NOTE — Op Note (Signed)
[facility] Patient Name: Hayley Padilla Procedure Date: 03/18/2020 9:59 AM MRN: [id number] Endoscopist: Tressia Danas MD, MD Age: 69 Referring MD:  Date of Birth: 06-Jun-1951 Gender: Female Account #: [id number] Procedure:                Colonoscopy Indications:              Screening for colorectal malignant neoplasm                           No known family history of colon cancer or polyps Medicines:                Monitored Anesthesia Care Procedure:                Pre-Anesthesia Assessment:                           - Prior to the procedure, a History and Physical                            was performed, and patient medications and                            allergies were reviewed. The patient's tolerance of                            previous anesthesia was also reviewed. The risks                            and benefits of the procedure and the sedation                            options and risks were discussed with the patient.                            All questions were answered, and informed consent                            was obtained. Prior Anticoagulants: The patient has                            taken no previous anticoagulant or antiplatelet                            agents. ASA Grade Assessment: II - A patient with                            mild systemic disease. After reviewing the risks                            and benefits, the patient was deemed in                            satisfactory condition to undergo the procedure.  After obtaining informed consent, the colonoscope                            was passed under direct vision. Throughout the                            procedure, the patient's blood pressure, pulse, and                            oxygen saturations were monitored continuously. The                            Colonoscope was introduced through the anus with                            the intention of  advancing to the splenic flexure.                            The scope was advanced to the descending colon                            before the procedure was aborted. Medications were                            given. The colonoscopy was extremely difficult due                            to inadequate bowel prep. The patient tolerated the                            procedure well. The quality of the bowel                            preparation was inadequate. Scope In: 10:10:01 AM Scope Out: 10:15:45 AM Scope Withdrawal Time: 0 hours 1 minute 26 seconds  Total Procedure Duration: 0 hours 5 minutes 44 seconds  Findings:                 The perianal and digital rectal examinations were                            normal.                           Extensive amounts of liquid semi-liquid semi-solid                            solid stool was found in the entire colon,                            precluding visualization. The extent of residual                            stool did not improve as the scope advanced  proximally in the colon. Complications:            No immediate complications. Estimated Blood Loss:     Estimated blood loss: none. Impression:               - Preparation of the colon was inadequate.                           - Stool in the entire examined colon.                           - No specimens collected. Recommendation:           - Patient has a contact number available for                            emergencies. The signs and symptoms of potential                            delayed complications were discussed with the                            patient. Return to normal activities tomorrow.                            Written discharge instructions were provided to the                            patient.                           - Resume previous diet.                           - Continue present medications.                           -  Repeat colonoscopy at the next available                            appointment because the bowel preparation was poor.                            Poor prep may be due to eating breakfast yesterday.                            Use two day prep in the future. Thornton Park MD, MD 03/18/2020 10:23:49 AM This report has been signed electronically.

## 2020-03-18 NOTE — Progress Notes (Signed)
Pt's states no medical or surgical changes since previsit or office visit. 

## 2020-03-19 ENCOUNTER — Ambulatory Visit (AMBULATORY_SURGERY_CENTER): Payer: Medicare Other | Admitting: Gastroenterology

## 2020-03-19 ENCOUNTER — Encounter: Payer: Self-pay | Admitting: Gastroenterology

## 2020-03-19 VITALS — BP 146/68 | HR 69 | Temp 96.9°F | Resp 21 | Ht 62.0 in | Wt 206.0 lb

## 2020-03-19 DIAGNOSIS — Z1211 Encounter for screening for malignant neoplasm of colon: Secondary | ICD-10-CM | POA: Diagnosis not present

## 2020-03-19 MED ORDER — SODIUM CHLORIDE 0.9 % IV SOLN: 500.0000 mL | INTRAVENOUS | Status: DC

## 2020-03-19 NOTE — Op Note (Signed)
[facility] Patient Name: Hayley Padilla Procedure Date: 03/19/2020 9:04 AM MRN: [id number] Endoscopist: Sherilyn Cooter L. Myrtie Neither , MD Age: 69 Referring MD:  Date of Birth: June 08, 1951 Gender: Female Account #: [id number] Procedure:                Colonoscopy Indications:              Screening for colorectal malignant neoplasm, This                            is the patient's first colonoscopy Medicines:                Monitored Anesthesia Care Procedure:                Pre-Anesthesia Assessment:                           - Prior to the procedure, a History and Physical                            was performed, and patient medications and                            allergies were reviewed. The patient's tolerance of                            previous anesthesia was also reviewed. The risks                            and benefits of the procedure and the sedation                            options and risks were discussed with the patient.                            All questions were answered, and informed consent                            was obtained. Prior Anticoagulants: The patient has                            taken no previous anticoagulant or antiplatelet                            agents. ASA Grade Assessment: II - A patient with                            mild systemic disease. After reviewing the risks                            and benefits, the patient was deemed in                            satisfactory condition to undergo the procedure.  After obtaining informed consent, the colonoscope                            was passed under direct vision. Throughout the                            procedure, the patient's blood pressure, pulse, and                            oxygen saturations were monitored continuously. The                            Colonoscope was introduced through the anus and                            advanced to the the cecum,  identified by                            appendiceal orifice and ileocecal valve. The                            colonoscopy was performed without difficulty. The                            patient tolerated the procedure well. The quality                            of the bowel preparation was good after lavage. The                            ileocecal valve, appendiceal orifice, and rectum                            were photographed. The bowel preparation used was 2                            day Suprep/Miralax. (patient arrived the day prior                            with insufficient prep after miralax,took more prep                            for today) Scope In: 9:08:12 AM Scope Out: 9:28:27 AM Scope Withdrawal Time: 0 hours 14 minutes 22 seconds  Total Procedure Duration: 0 hours 20 minutes 15 seconds  Findings:                 The perianal and digital rectal examinations were                            normal.                           The entire examined colon appeared normal on direct  and retroflexion views. Complications:            No immediate complications. Estimated Blood Loss:     Estimated blood loss: none. Impression:               - The entire examined colon is normal on direct and                            retroflexion views.                           - No specimens collected. Recommendation:           - Patient has a contact number available for                            emergencies. The signs and symptoms of potential                            delayed complications were discussed with the                            patient. Return to normal activities tomorrow.                            Written discharge instructions were provided to the                            patient.                           - Resume previous diet.                           - Continue present medications.                           - Based on current  guidelines, no repeat screening                            colonoscopy due to age and lack of polyps on this                            exam. Roran Wegner L. Loletha Carrow, MD 03/19/2020 9:32:08 AM This report has been signed electronically.

## 2020-03-19 NOTE — Progress Notes (Signed)
Temp-JB VS-CW  Pt's states no medical or surgical changes since previsit or office visit.  

## 2020-03-19 NOTE — Patient Instructions (Signed)
YOU HAD AN ENDOSCOPIC PROCEDURE TODAY AT [facility]:   Refer to the procedure report that was given to you for any specific questions about what was found during the examination.  If the procedure report does not answer your questions, please call your gastroenterologist to clarify.  If you requested that your care partner not be given the details of your procedure findings, then the procedure report has been included in a sealed envelope for you to review at your convenience later.  YOU SHOULD EXPECT: Some feelings of bloating in the abdomen. Passage of more gas than usual.  Walking can help get rid of the air that was put into your GI tract during the procedure and reduce the bloating. If you had a lower endoscopy (such as a colonoscopy or flexible sigmoidoscopy) you may notice spotting of blood in your stool or on the toilet paper. If you underwent a bowel prep for your procedure, you may not have a normal bowel movement for a few days.  Please Note:  You might notice some irritation and congestion in your nose or some drainage.  This is from the oxygen used during your procedure.  There is no need for concern and it should clear up in a day or so.  SYMPTOMS TO REPORT IMMEDIATELY:   Following lower endoscopy (colonoscopy or flexible sigmoidoscopy):  Excessive amounts of blood in the stool  Significant tenderness or worsening of abdominal pains  Swelling of the abdomen that is new, acute  Fever of 100F or higher   For urgent or emergent issues, a gastroenterologist can be reached at any hour by calling [phone]. Do not use MyChart messaging for urgent concerns.    DIET:  We do recommend a small meal at first, but then you may proceed to your regular diet.  Drink plenty of fluids but you should avoid alcoholic beverages for 24 hours.  ACTIVITY:  You should plan to take it easy for the rest of today and you should NOT DRIVE or use heavy machinery until tomorrow (because  of the sedation medicines used during the test).    FOLLOW UP: Our staff will call the number listed on your records 48-72 hours following your procedure to check on you and address any questions or concerns that you may have regarding the information given to you following your procedure. If we do not reach you, we will leave a message.  We will attempt to reach you two times.  During this call, we will ask if you have developed any symptoms of COVID 19. If you develop any symptoms (ie: fever, flu-like symptoms, shortness of breath, cough etc.) before then, please call [phone].  If you test positive for Covid 19 in the 2 weeks post procedure, please call and report this information to [place].    If any biopsies were taken you will be contacted by phone or by letter within the next 1-3 weeks.  Please call us at [phone] if you have not heard about the biopsies in 3 weeks.    SIGNATURES/CONFIDENTIALITY: You and/or your care partner have signed paperwork which will be entered into your electronic medical record.  These signatures attest to the fact that that the information above on your After Visit Summary has been reviewed and is understood.  Full responsibility of the confidentiality of this discharge information lies with you and/or your care   Went over Discharge instructions with patient. Normal exam. Resume medications today. No repeat colonoscopy needed per  Dr. Myrtie Neither.

## 2020-03-19 NOTE — Progress Notes (Signed)
Report given to PACU, vss 

## 2020-03-20 ENCOUNTER — Telehealth: Payer: Self-pay

## 2020-03-20 NOTE — Telephone Encounter (Signed)
Left message on follow up call. 

## 2020-03-20 NOTE — Telephone Encounter (Signed)
  Follow up Call-  Call back number 03/19/2020 03/18/2020  Post procedure Call Back phone  # [phone]  Permission to leave phone message Yes Yes     Patient questions:  Do you have a fever, pain , or abdominal swelling? No. Pain Score  0 *  Have you tolerated food without any problems? Yes.    Have you been able to return to your normal activities? Yes.    Do you have any questions about your discharge instructions: Diet   No. Medications  No. Follow up visit  No.  Do you have questions or concerns about your Care? No.  Actions: * If pain score is 4 or above: No action needed, pain <4.  1. Have you developed a fever since your procedure? no  2.   Have you had an respiratory symptoms (SOB or cough) since your procedure? no  3.   Have you tested positive for COVID 19 since your procedure no  4.   Have you had any family members/close contacts diagnosed with the COVID 19 since your procedure?  no   If yes to any of these questions please route to Laverna Peace, RN and Charlett Lango, RN

## 2020-03-21 ENCOUNTER — Telehealth: Payer: Self-pay

## 2020-03-21 ENCOUNTER — Telehealth: Payer: Self-pay | Admitting: *Deleted

## 2020-03-21 NOTE — Telephone Encounter (Signed)
  Follow up Call-  Call back number 03/19/2020 03/18/2020  Post procedure Call Back phone  # [phone]  Permission to leave phone message Yes Yes     Patient questions:  Do you have a fever, pain , or abdominal swelling? No. Pain Score  0 *  Have you tolerated food without any problems? Yes.    Have you been able to return to your normal activities? Yes.    Do you have any questions about your discharge instructions: Diet   No. Medications  No. Follow up visit  No.  Do you have questions or concerns about your Care? No.  Actions: * If pain score is 4 or above: No action needed, pain <4.   1. Have you developed a fever since your procedure? No  2.   Have you had an respiratory symptoms (SOB or cough) since your procedure? No  3.   Have you tested positive for COVID 19 since your procedure No  4.   Have you had any family members/close contacts diagnosed with the COVID 19 since your procedure?  No   If yes to any of these questions please route to Laverna Peace, RN and Charlett Lango, RN

## 2020-03-21 NOTE — Telephone Encounter (Signed)
  Follow up Call-  Call back number 03/19/2020 03/18/2020  Post procedure Call Back phone  # [phone]  Permission to leave phone message Yes Yes     Patient questions:  Do you have a fever, pain , or abdominal swelling? No. Pain Score  0 *  Have you tolerated food without any problems? Yes.    Have you been able to return to your normal activities? Yes.    Do you have any questions about your discharge instructions: Diet   No. Medications  No. Follow up visit  No.  Do you have questions or concerns about your Care? No.  Actions: * If pain score is 4 or above: No action needed, pain <4.

## 2020-11-13 ENCOUNTER — Other Ambulatory Visit: Payer: Self-pay | Admitting: Internal Medicine

## 2020-11-13 DIAGNOSIS — Z1231 Encounter for screening mammogram for malignant neoplasm of breast: Secondary | ICD-10-CM

## 2021-01-06 ENCOUNTER — Ambulatory Visit: Payer: Medicare Other

## 2021-01-21 ENCOUNTER — Other Ambulatory Visit: Payer: Self-pay | Admitting: Internal Medicine

## 2021-01-22 LAB — LIPID PANEL
Cholesterol: 168 mg/dL (ref <200)
HDL: 47 mg/dL — ABNORMAL LOW (ref >=50)
LDL Cholesterol (Calc): 98 mg/dL (calc)
Non-HDL Cholesterol (Calc): 121 mg/dL (calc) (ref <130)
Total CHOL/HDL Ratio: 3.6 (calc) (ref <5.0)
Triglycerides: 124 mg/dL (ref <150)

## 2021-01-22 LAB — COMPLETE METABOLIC PANEL WITH GFR
AG Ratio: 1.9 (calc) (ref 1.0–2.5)
ALT: 8 U/L (ref 6–29)
AST: 17 U/L (ref 10–35)
Albumin: 4 g/dL (ref 3.6–5.1)
Alkaline phosphatase (APISO): 127 U/L (ref 37–153)
BUN: 11 mg/dL (ref 7–25)
CO2: 25 mmol/L (ref 20–32)
Calcium: 9 mg/dL (ref 8.6–10.4)
Chloride: 106 mmol/L (ref 98–110)
Creat: 0.67 mg/dL (ref 0.60–0.93)
GFR, Est African American: 102 mL/min/{1.73_m2} (ref >=60)
GFR, Est Non African American: 88 mL/min/{1.73_m2} (ref >=60)
Globulin: 2.1 g/dL (calc) (ref 1.9–3.7)
Glucose, Bld: 90 mg/dL (ref 65–99)
Potassium: 3.8 mmol/L (ref 3.5–5.3)
Sodium: 140 mmol/L (ref 135–146)
Total Bilirubin: 0.4 mg/dL (ref 0.2–1.2)
Total Protein: 6.1 g/dL (ref 6.1–8.1)

## 2021-01-22 LAB — CBC
HCT: 32.1 % — ABNORMAL LOW (ref 35.0–45.0)
Hemoglobin: 10 g/dL — ABNORMAL LOW (ref 11.7–15.5)
MCH: 23.9 pg — ABNORMAL LOW (ref 27.0–33.0)
MCHC: 31.2 g/dL — ABNORMAL LOW (ref 32.0–36.0)
MCV: 76.8 fL — ABNORMAL LOW (ref 80.0–100.0)
MPV: 10.2 fL (ref 7.5–12.5)
Platelets: 348 10*3/uL (ref 140–400)
RBC: 4.18 10*6/uL (ref 3.80–5.10)
RDW: 14.1 % (ref 11.0–15.0)
WBC: 5.2 10*3/uL (ref 3.8–10.8)

## 2021-01-22 LAB — TSH: TSH: 2.02 mIU/L (ref 0.40–4.50)

## 2021-02-12 ENCOUNTER — Other Ambulatory Visit: Payer: Self-pay

## 2021-02-12 ENCOUNTER — Ambulatory Visit
Admission: RE | Admit: 2021-02-12 | Discharge: 2021-02-12 | Disposition: A | Discharge status: Home / Self Care | Payer: Medicare Other | Source: Ambulatory Visit | Attending: Internal Medicine | Admitting: Internal Medicine

## 2021-02-12 DIAGNOSIS — Z1231 Encounter for screening mammogram for malignant neoplasm of breast: Secondary | ICD-10-CM

## 2021-12-26 ENCOUNTER — Other Ambulatory Visit: Payer: Self-pay | Admitting: Rehabilitation

## 2021-12-26 DIAGNOSIS — M5416 Radiculopathy, lumbar region: Secondary | ICD-10-CM

## 2022-01-10 ENCOUNTER — Ambulatory Visit
Admission: RE | Admit: 2022-01-10 | Discharge: 2022-01-10 | Disposition: A | Discharge status: Home / Self Care | Payer: Medicare Other | Source: Ambulatory Visit | Attending: Rehabilitation | Admitting: Rehabilitation

## 2022-01-10 ENCOUNTER — Other Ambulatory Visit: Payer: Self-pay

## 2022-01-10 DIAGNOSIS — M5416 Radiculopathy, lumbar region: Secondary | ICD-10-CM

## 2022-01-10 MED ORDER — GADOBENATE DIMEGLUMINE 529 MG/ML IV SOLN
19.0000 mL | Freq: Once | INTRAVENOUS | Status: AC | PRN
  Administered 2022-01-10: 19 mL via INTRAVENOUS

## 2022-01-13 ENCOUNTER — Telehealth: Payer: Self-pay

## 2022-01-13 ENCOUNTER — Encounter: Payer: Self-pay | Admitting: Internal Medicine

## 2022-01-13 ENCOUNTER — Ambulatory Visit (INDEPENDENT_AMBULATORY_CARE_PROVIDER_SITE_OTHER): Payer: 59 | Admitting: Internal Medicine

## 2022-01-13 ENCOUNTER — Other Ambulatory Visit: Payer: Self-pay

## 2022-01-13 DIAGNOSIS — Z981 Arthrodesis status: Secondary | ICD-10-CM

## 2022-01-13 DIAGNOSIS — M4646 Discitis, unspecified, lumbar region: Secondary | ICD-10-CM | POA: Insufficient documentation

## 2022-01-13 DIAGNOSIS — M40209 Unspecified kyphosis, site unspecified: Secondary | ICD-10-CM

## 2022-01-13 DIAGNOSIS — E669 Obesity, unspecified: Secondary | ICD-10-CM

## 2022-01-13 NOTE — Patient Instructions (Signed)
Your MRI scan shows evidence of infection in the disc space between the first and second lumbar vertebrae.  We need to arrange for radiology to do a needle aspirate of the disc to obtain specimens for culture to help guide antibiotic therapy.  There is a strong chance that she may need to have an IV placed for outpatient IV antibiotics.  Based on our discussion today it sounds like this would be too much for you and your family to manage at home.  If we need to arrange placement in a rehab facility for a period of time it would be best to have you admitted to [facility] to make all of these arrangements.

## 2022-01-13 NOTE — Telephone Encounter (Signed)
Patient APPROVED for admitting by Attending R.Smith MD per Dr Megan Salon. I spoke to [facility] at Patient Placement  at 4 pm and gave Dx: Lumbar Discitis     Bed Type: [facility] Bed. Explained to that Patient Placement will contact patient by phone when bed is available. Estimate 1-2 days. I did stress to patient that she needs to answer all calls so that she does not miss a call from them as it is hard to get a bed right now. She will go home and prepare for admit.

## 2022-01-13 NOTE — Progress Notes (Addendum)
[place] for Infectious Disease  Reason for Consult: Lumbar discitis Referring Provider: Dr. Ike Bene  Assessment: It appears that Hayley Padilla has developed acute lumbar discitis and paravertebral infection on top of severe chronic degenerative arthritis.  I discussed the need for lumbar aspiration by radiology and the possible need for PICC placement and long-term IV antibiotics.  She told me that she does not feel that she and her family could manage this at home.  Given her severe pain and recent, frequent falls she believes that she needs placement in a skilled nursing facility on a temporary basis.  I believe it is in her best interest to arrange admission to the hospital for further diagnostic testing, continued follow-up by my ID partners and probable skilled nursing facility placement.  Plan: Blood work today including blood cultures, CBC, CMP, ESR and CRP Arrange admission to [facility] off on empiric antibiotic therapy until after the lumbar aspirate is obtained for Gram stain and culture  Patient Active Problem List   Diagnosis Date Noted   Lumbar discitis 01/13/2022    Priority: High   Obesity (BMI 30-39.9) 01/13/2022   S/P lumbar spinal fusion 01/13/2022   Kyphosis 01/13/2022   Degenerative spondylolisthesis 06/12/2019    Patient's Medications  New Prescriptions   No medications on file  Previous Medications   AMLODIPINE (NORVASC) 10 MG TABLET    Take 10 mg by mouth daily.   BACLOFEN (LIORESAL) 10 MG TABLET    Take 10 mg by mouth 2 (two) times daily.   CALCIUM CARB-CHOLECALCIFEROL (CALCIUM 600/VITAMIN D3 PO)    Take 1 tablet by mouth daily.   CLONIDINE (CATAPRES) 0.1 MG TABLET    Take 0.1 mg by mouth 2 (two) times daily.   DICLOFENAC (VOLTAREN) 75 MG EC TABLET    Take 1 tablet (75 mg total) by mouth 2 (two) times daily.   FLUTICASONE (FLONASE) 50 MCG/ACT NASAL SPRAY    Place 1 spray into both nostrils daily as needed for allergies or rhinitis.    FUROSEMIDE (LASIX) 20 MG TABLET    Take 20 mg by mouth daily.   LOSARTAN (COZAAR) 100 MG TABLET    Take 100 mg by mouth daily.   LOVASTATIN (MEVACOR) 40 MG TABLET    Take 40 mg by mouth every evening.    METHOCARBAMOL (ROBAXIN) 750 MG TABLET    Take 750 mg by mouth 2 (two) times a day.   POTASSIUM CHLORIDE (K-DUR) 10 MEQ TABLET    Take 20 mEq by mouth daily.    VITAMIN D, ERGOCALCIFEROL, (DRISDOL) 1.25 MG (50000 UNIT) CAPS CAPSULE    Take 50,000 Units by mouth once a week.  Modified Medications   No medications on file  Discontinued Medications   No medications on file    HPI: Hayley Padilla is a 71 y.o. female with a long history of back pain and prior surgeries.  She underwent posterior spinal fusion at the L4-5 level in 2011 at the [place].  She underwent a second surgery for revision of the fusion at the L2-4 level in 2000 by Dr. Granville Lewis.  Over the last year she has had increasing low back pain and more recently developed progressive numbness and weakness in her right leg.  She went to see Dr. Zonia Kief a few days ago.  He ordered an MRI which revealed:  IMPRESSION: 1. 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 1.6 x 1 x 2.3 cm heterogeneous signal along the  left ventral epidural space concerning for a phlegmon with small areas of non enhancement likely reflecting a developing abscess. Mass effect on the left intraspinal L2 nerve root. Severe central canal stenosis. Moderate left foraminal stenosis. Severe bilateral facet arthropathy.  She has not had any fever, chills or sweats.  She has not been on any recent antibiotics.  She lives with her grandson and granddaughter.  She has had multiple falls recently.  She is still driving.  She goes to [facility] pain clinic.  She says that she is always in pain.  Her baseline pain is about 8 out of 10.  Here recently her low back pain can get up to 10 out of 10.    Review of Systems: Review of Systems  Constitutional:  Positive for malaise/fatigue.  Negative for chills, diaphoresis, fever and weight loss.  Respiratory:  Negative for cough.   Cardiovascular:  Negative for chest pain.  Gastrointestinal:  Negative for abdominal pain, diarrhea, nausea and vomiting.  Genitourinary:  Negative for dysuria.       Chronic stress urinary incontinence  Musculoskeletal:  Positive for back pain, falls and joint pain.  Neurological:  Positive for sensory change, focal weakness and weakness.     Past Medical History:  Diagnosis Date   Allergy    Arthritis    Hyperlipidemia    Hypertension    PONV (postoperative nausea and vomiting)     Social History   Tobacco Use   Smoking status: Never   Smokeless tobacco: Current    Types: Chew   Tobacco comments:    last used 03/17/2020 @ 2200  Vaping Use   Vaping Use: Never used  Substance Use Topics   Alcohol use: Not Currently   Drug use: Never    Family History  Problem Relation Age of Onset   Colon cancer Neg Hx    Colon polyps Neg Hx    Esophageal cancer Neg Hx    Rectal cancer Neg Hx    Stomach cancer Neg Hx    Allergies  Allergen Reactions   Penicillins Anaphylaxis    Did it involve swelling of the face/tongue/throat, SOB, or low BP? Yes Did it involve sudden or severe rash/hives, skin peeling, or any reaction on the inside of your mouth or nose? No Did you need to seek medical attention at a hospital or doctor's office? Yes When did it last happen?      "Been a While"  If all above answers are NO, may proceed with cephalosporin use.    Aspirin Other (See Comments)    brusing _ patient requested to be listed    OBJECTIVE: There were no vitals filed for this visit. There is no height or weight on file to calculate BMI.   Physical Exam Constitutional:      Comments: She walks very slowly with a hunched over, shuffling gait.  She uses a walker.  Cardiovascular:     Rate and Rhythm: Normal rate and regular rhythm.     Heart sounds: No murmur heard. Pulmonary:      Effort: Pulmonary effort is normal.     Breath sounds: Normal breath sounds.  Abdominal:     Palpations: Abdomen is soft.     Tenderness: There is no abdominal tenderness.  Musculoskeletal:     Comments: Healed lumbar incision.  Neurological:     Motor: Weakness present.     Gait: Gait abnormal.     Comments: She has difficulty transitioning from sitting to standing  because of weakness and back pain.  Psychiatric:        Mood and Affect: Mood normal.    Microbiology: No results found for this or any previous visit (from the past 240 hour(s)).  Michel Bickers, MD [facility] [place] [facility] [phone] pager   [phone] cell 01/13/2022, 2:30 PM

## 2022-01-14 ENCOUNTER — Inpatient Hospital Stay (HOSPITAL_COMMUNITY)
Admission: AD | Admit: 2022-01-14 | Discharge: 2022-01-20 | DRG: 477 | Disposition: A | Discharge status: Skilled Nursing Facility | Payer: Medicare Other | Source: Ambulatory Visit | Attending: Internal Medicine | Admitting: Internal Medicine

## 2022-01-14 DIAGNOSIS — Z880 Allergy status to penicillin: Secondary | ICD-10-CM | POA: Diagnosis not present

## 2022-01-14 DIAGNOSIS — Z5181 Encounter for therapeutic drug level monitoring: Secondary | ICD-10-CM

## 2022-01-14 DIAGNOSIS — M48061 Spinal stenosis, lumbar region without neurogenic claudication: Secondary | ICD-10-CM | POA: Diagnosis present

## 2022-01-14 DIAGNOSIS — M5136 Other intervertebral disc degeneration, lumbar region: Secondary | ICD-10-CM | POA: Diagnosis present

## 2022-01-14 DIAGNOSIS — E876 Hypokalemia: Secondary | ICD-10-CM | POA: Diagnosis present

## 2022-01-14 DIAGNOSIS — I10 Essential (primary) hypertension: Secondary | ICD-10-CM | POA: Diagnosis present

## 2022-01-14 DIAGNOSIS — R600 Localized edema: Secondary | ICD-10-CM | POA: Diagnosis present

## 2022-01-14 DIAGNOSIS — Z20822 Contact with and (suspected) exposure to covid-19: Secondary | ICD-10-CM | POA: Diagnosis present

## 2022-01-14 DIAGNOSIS — M1711 Unilateral primary osteoarthritis, right knee: Secondary | ICD-10-CM | POA: Diagnosis present

## 2022-01-14 DIAGNOSIS — M4640 Discitis, unspecified, site unspecified: Secondary | ICD-10-CM | POA: Diagnosis present

## 2022-01-14 DIAGNOSIS — G061 Intraspinal abscess and granuloma: Secondary | ICD-10-CM | POA: Diagnosis present

## 2022-01-14 DIAGNOSIS — K5900 Constipation, unspecified: Secondary | ICD-10-CM | POA: Diagnosis present

## 2022-01-14 DIAGNOSIS — R296 Repeated falls: Secondary | ICD-10-CM | POA: Diagnosis present

## 2022-01-14 DIAGNOSIS — Z96652 Presence of left artificial knee joint: Secondary | ICD-10-CM | POA: Diagnosis present

## 2022-01-14 DIAGNOSIS — G8929 Other chronic pain: Secondary | ICD-10-CM | POA: Diagnosis present

## 2022-01-14 DIAGNOSIS — E669 Obesity, unspecified: Secondary | ICD-10-CM | POA: Diagnosis present

## 2022-01-14 DIAGNOSIS — M4626 Osteomyelitis of vertebra, lumbar region: Principal | ICD-10-CM | POA: Diagnosis present

## 2022-01-14 DIAGNOSIS — Z720 Tobacco use: Secondary | ICD-10-CM

## 2022-01-14 DIAGNOSIS — B9689 Other specified bacterial agents as the cause of diseases classified elsewhere: Secondary | ICD-10-CM | POA: Diagnosis present

## 2022-01-14 DIAGNOSIS — Z981 Arthrodesis status: Secondary | ICD-10-CM | POA: Diagnosis not present

## 2022-01-14 DIAGNOSIS — Z6837 Body mass index (BMI) 37.0-37.9, adult: Secondary | ICD-10-CM

## 2022-01-14 DIAGNOSIS — M4646 Discitis, unspecified, lumbar region: Secondary | ICD-10-CM | POA: Diagnosis present

## 2022-01-14 DIAGNOSIS — R7881 Bacteremia: Secondary | ICD-10-CM | POA: Diagnosis present

## 2022-01-14 DIAGNOSIS — D509 Iron deficiency anemia, unspecified: Secondary | ICD-10-CM | POA: Diagnosis present

## 2022-01-14 DIAGNOSIS — R509 Fever, unspecified: Secondary | ICD-10-CM | POA: Diagnosis not present

## 2022-01-14 DIAGNOSIS — E785 Hyperlipidemia, unspecified: Secondary | ICD-10-CM | POA: Diagnosis present

## 2022-01-14 DIAGNOSIS — G894 Chronic pain syndrome: Secondary | ICD-10-CM | POA: Diagnosis not present

## 2022-01-14 DIAGNOSIS — Z886 Allergy status to analgesic agent status: Secondary | ICD-10-CM

## 2022-01-14 DIAGNOSIS — Z9181 History of falling: Secondary | ICD-10-CM | POA: Diagnosis not present

## 2022-01-14 DIAGNOSIS — R609 Edema, unspecified: Secondary | ICD-10-CM | POA: Diagnosis not present

## 2022-01-14 DIAGNOSIS — Z79899 Other long term (current) drug therapy: Secondary | ICD-10-CM

## 2022-01-14 LAB — COMPREHENSIVE METABOLIC PANEL
AG Ratio: 1.7 (calc) (ref 1.0–2.5)
ALT: 16 U/L (ref 6–29)
AST: 22 U/L (ref 10–35)
Albumin: 4.3 g/dL (ref 3.6–5.1)
Alkaline phosphatase (APISO): 138 U/L (ref 37–153)
BUN: 10 mg/dL (ref 7–25)
CO2: 29 mmol/L (ref 20–32)
Calcium: 9.3 mg/dL (ref 8.6–10.4)
Chloride: 104 mmol/L (ref 98–110)
Creat: 0.92 mg/dL (ref 0.60–1.00)
Globulin: 2.5 g/dL (calc) (ref 1.9–3.7)
Glucose, Bld: 127 mg/dL — ABNORMAL HIGH (ref 65–99)
Potassium: 3.7 mmol/L (ref 3.5–5.3)
Sodium: 142 mmol/L (ref 135–146)
Total Bilirubin: 0.5 mg/dL (ref 0.2–1.2)
Total Protein: 6.8 g/dL (ref 6.1–8.1)

## 2022-01-14 LAB — CBC
HCT: 37.3 % (ref 35.0–45.0)
Hemoglobin: 11.2 g/dL — ABNORMAL LOW (ref 11.7–15.5)
MCH: 22.8 pg — ABNORMAL LOW (ref 27.0–33.0)
MCHC: 30 g/dL — ABNORMAL LOW (ref 32.0–36.0)
MCV: 75.8 fL — ABNORMAL LOW (ref 80.0–100.0)
MPV: 9.7 fL (ref 7.5–12.5)
Platelets: 295 10*3/uL (ref 140–400)
RBC: 4.92 10*6/uL (ref 3.80–5.10)
RDW: 14.4 % (ref 11.0–15.0)
WBC: 5.1 10*3/uL (ref 3.8–10.8)

## 2022-01-14 LAB — SEDIMENTATION RATE: Sed Rate: 11 mm/h (ref 0–30)

## 2022-01-14 LAB — C-REACTIVE PROTEIN: CRP: 3.5 mg/L (ref <8.0)

## 2022-01-14 NOTE — Subjective & Objective (Signed)
Has chronic back pain that has gotten worse Sp  posterior spinal fusion at the L4-5 level in 2011 at the [place].  She underwent a second surgery for revision of the fusion at the L2-4 level in 2000 by Dr. Altamease Oiler.    had increasing low back pain and more recently developed progressive numbness and weakness in her right leg.  She went to see Dr. 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 1.6 x 1 x 2.3 cm heterogeneous signal along the left ventral epidural space concerning for a phlegmon with small areas of non enhancement likely reflecting a developing abscess. Mass effect on the left intraspinal L2 nerve root. Severe central canal stenosis. Moderate left foraminal stenosis. Severe bilateral facet arthropathy.  No fever or chills has had multiple falls recently

## 2022-01-14 NOTE — Telephone Encounter (Signed)
Patient called to follow up on her hospital admission. I spoke with Larita Fife with bed control at [facility] and they currently still do not have a bed available for the patient. Patient has been informed that they are still waiting on a bed to come available. Patient verbalized understanding and asked if there are any other options.

## 2022-01-14 NOTE — Telephone Encounter (Signed)
Patient informed and will wait for a call from [facility] bed control. Please follow up with patient tomorrow to see if she has received a call regarding admission. Lahari Suttles T Brooks Sailors

## 2022-01-14 NOTE — H&P (Signed)
Hayley Padilla [id number] DOB: Apr 22, 1951 DOA: 01/14/2022     PCP: Nolene Ebbs, MD   Outpatient Specialists:  ID Dr.Camble  Patient arrived to ER on  at  Referred by Attending Toy Baker, MD   Patient coming from: home Lives With family    Chief Complaint:   Back pain   HPI: Hayley Padilla is a 71 y.o. female with medical history significant of HTN, HLD, DDD    Presented with   worsening back pain  Has chronic back pain that has gotten worse Sp  posterior spinal fusion at the L4-5 level in 2011 at the [place].  She underwent a second surgery for revision of the fusion at the L2-4 level in 2000 by Dr. Granville Lewis.    had increasing low back pain and more recently developed progressive numbness and weakness in her right leg.  She went to see Dr. 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 1.6 x 1 x 2.3 cm heterogeneous signal along the left ventral epidural space concerning for a phlegmon with small areas of non enhancement likely reflecting a developing abscess. Mass effect on the left intraspinal L2 nerve root. Severe central canal stenosis. Moderate left foraminal stenosis. Severe bilateral facet arthropathy.  No fever or chills has had multiple falls recently Report just now has low grade fever of 100 Oxycodone 5 mg  TID prn  No cough no diarrhea No melena no blood in stool Has occasional constipation ladt BM was yestrday  Has been vaccinated against COVID and boosted had    Initial COVID TEST  in house  PCR testing  Pending  Lab Results  Component Value Date   SARSCOV2NAA RESULT: NEGATIVE 03/13/2020   [place] NEGATIVE 06/19/2019   [place] NEGATIVE 06/08/2019     Regarding pertinent Chronic problems:     Hyperlipidemia -  on statins Mevacor Lipid Panel     Component Value Date/Time   CHOL 168 01/21/2021 0000   TRIG 124 01/21/2021 0000   HDL 47 (L) 01/21/2021 0000   CHOLHDL 3.6 01/21/2021 0000   LDLCALC 98 01/21/2021  0000     HTN on cozaar, clonidine lasix, amlodipine    obesity-   BMI Readings from Last 1 Encounters:  01/13/22 37.68 kg/m      Chronic anemia - baseline hg Hemoglobin & Hematocrit  Recent Labs    01/21/21 0000 01/13/22 0311  HGB 10.0* 11.2*    Following Medications were ordered in ER: Medications  cloNIDine (CATAPRES) tablet 0.1 mg (has no administration in time range)  gabapentin (NEURONTIN) capsule 300 mg (has no administration in time range)  0.9 %  sodium chloride infusion (has no administration in time range)  acetaminophen (TYLENOL) tablet 650 mg (has no administration in time range)    Or  acetaminophen (TYLENOL) suppository 650 mg (has no administration in time range)  HYDROcodone-acetaminophen (NORCO/VICODIN) 5-325 MG per tablet 1-2 tablet (has no administration in time range)    ___ _________________________________________ Significant initial  Findings: Abnormal Labs Reviewed - No abnormal labs to display     ECG: Ordered Personally reviewed by me showing: HR : 80 Rhythm: NSR   no evidence of acute ischemic changes QTC 452   WBC     Component Value Date/Time   WBC 5.1 01/13/2022 0311   LYMPHSABS 2.8 06/07/2019 1352   MONOABS 0.4 06/07/2019 1352   EOSABS 0.2 06/07/2019 1352   BASOSABS 0.0 06/07/2019 1352  UA not ordered   Results for orders placed or performed in visit on 01/13/22  Blood culture (routine single)     Status: None (Preliminary result)   Collection Time: 01/13/22  3:11 AM   Specimen: Blood  Result Value Ref Range Status   MICRO NUMBER: [id number]  Preliminary   SPECIMEN QUALITY: Adequate  Preliminary   Source BLOOD 2  Preliminary   STATUS: PRELIMINARY  Preliminary   Result:   Preliminary    No growth to date. Culture is continuously monitored for a total of 120 hours incubation. A change in status will result in a phone report followed by an updated printed culture report.   COMMENT: Aerobic and anaerobic bottle received.   Preliminary  Blood culture (routine single)     Status: None (Preliminary result)   Collection Time: 01/13/22  3:11 AM   Specimen: Blood  Result Value Ref Range Status   MICRO NUMBER: [id number]  Preliminary   SPECIMEN QUALITY: Adequate  Preliminary   Source BLOOD 1  Preliminary   STATUS: PRELIMINARY  Preliminary   Result:   Preliminary    No growth to date. Culture is continuously monitored for a total of 120 hours incubation. A change in status will result in a phone report followed by an updated printed culture report.   COMMENT: Aerobic and anaerobic bottle received.  Preliminary     _______________________________________________ Hospitalist was called for admission for discitis  The following Work up has been ordered so far:  Orders Placed This Encounter  Procedures   Culture, blood (routine x 2)   Resp Panel by RT-PCR (Flu A&B, Covid) Nasopharyngeal Swab   IR Radiologist Eval & Mgmt   CBC with Differential/Platelet   Comprehensive metabolic panel   Sedimentation rate   C-reactive protein   Diet NPO time specified   Admit to Inpatient (patient's expected length of stay will be greater than 2 midnights or inpatient only procedure)     OTHER Significant initial  Findings:  labs showing:  Recent Labs  Lab 01/13/22 0311  NA 142  K 3.7  CO2 29  GLUCOSE 127*  BUN 10  CREATININE 0.92  CALCIUM 9.3    Cr   stable,    Lab Results  Component Value Date   CREATININE 0.92 01/13/2022   CREATININE 0.67 01/21/2021   CREATININE 0.62 06/07/2019    Recent Labs  Lab 01/13/22 0311  AST 22  ALT 16  BILITOT 0.5  PROT 6.8   Lab Results  Component Value Date   CALCIUM 9.3 01/13/2022    Plt: Lab Results  Component Value Date   PLT 295 01/13/2022    COVID-19 Labs  Recent Labs    01/13/22 0311  CRP 3.5    Lab Results  Component Value Date   SARSCOV2NAA RESULT: NEGATIVE 03/13/2020   SARSCOV2NAA NEGATIVE 06/19/2019   [place] NEGATIVE 06/08/2019         Recent Labs  Lab 01/13/22 0311  WBC 5.1  HGB 11.2*  HCT 37.3  MCV 75.8*  PLT 295    HG/HCT  stable,       Component Value Date/Time   HGB 11.2 (L) 01/13/2022 0311   HCT 37.3 01/13/2022 0311   MCV 75.8 (L) 01/13/2022 0311    _______________________________________________________________________________________________________ Latest  There were no vitals taken for this visit.   Vitals  labs and radiology finding personally reviewed  Review of Systems:    Pertinent positives include:   fatigue,back pain.   Constitutional:  No  weight loss, night sweats, Fevers, chills, weight loss  HEENT:  No headaches, Difficulty swallowing,Tooth/dental problems,Sore throat,  No sneezing, itching, ear ache, nasal congestion, post nasal drip,  Cardio-vascular:  No chest pain, Orthopnea, PND, anasarca, dizziness, palpitations.no Bilateral lower extremity swelling  GI:  No heartburn, indigestion, abdominal pain, nausea, vomiting, diarrhea, change in bowel habits, loss of appetite, melena, blood in stool, hematemesis Resp:  no shortness of breath at rest. No dyspnea on exertion, No excess mucus, no productive cough, No non-productive cough, No coughing up of blood.No change in color of mucus.No wheezing. Skin:  no rash or lesions. No jaundice GU:  no dysuria, change in color of urine, no urgency or frequency. No straining to urinate.  No flank pain.  Musculoskeletal:  No joint pain or no joint swelling. No decreased range of motion. No  Psych:  No change in mood or affect. No depression or anxiety. No memory loss.  Neuro: no localizing neurological complaints, no tingling, no weakness, no double vision, no gait abnormality, no slurred speech, no confusion  All systems reviewed and apart from HOPI all are negative _______________________________________________________________________________________________ Past Medical History:   Past Medical History:  Diagnosis Date   Allergy     Arthritis    Hyperlipidemia    Hypertension    PONV (postoperative nausea and vomiting)       Past Surgical History:  Procedure Laterality Date   BACK SURGERY     lower back   MULTIPLE TOOTH EXTRACTIONS      Social History:  Ambulatory walker      reports that she has never smoked. Her smokeless tobacco use includes chew. She reports that she does not currently use alcohol. She reports that she does not use drugs. Family History:   Family History  Problem Relation Age of Onset   Colon cancer Neg Hx    Colon polyps Neg Hx    Esophageal cancer Neg Hx    Rectal cancer Neg Hx    Stomach cancer Neg Hx    ______________________________________________________________________________________________ Allergies: Allergies  Allergen Reactions   Penicillins Anaphylaxis    Did it involve swelling of the face/tongue/throat, SOB, or low BP? Yes Did it involve sudden or severe rash/hives, skin peeling, or any reaction on the inside of your mouth or nose? No Did you Padilla to seek medical attention at a hospital or doctor's office? Yes When did it last happen?      "Been a While"  If all above answers are NO, may proceed with cephalosporin use.    Aspirin Other (See Comments)    brusing _ patient requested to be listed     Prior to Admission medications   Medication Sig Start Date End Date Taking? Authorizing Provider  amLODipine (NORVASC) 10 MG tablet Take 10 mg by mouth daily.    [provider]  baclofen (LIORESAL) 10 MG tablet Take 10 mg by mouth 2 (two) times daily. Patient not taking: Reported on 01/13/2022 01/17/20   [provider]  Calcium Carb-Cholecalciferol (CALCIUM 600/VITAMIN D3 PO) Take 1 tablet by mouth daily.    [provider]  cloNIDine (CATAPRES) 0.1 MG tablet Take 0.1 mg by mouth 2 (two) times daily.    [provider]  cyclobenzaprine (FLEXERIL) 10 MG tablet Take 10 mg by mouth 3 (three) times daily as needed for muscle  spasms.    [provider]  diclofenac (VOLTAREN) 75 MG EC tablet Take 1 tablet (75 mg total) by mouth 2 (two) times daily.  Patient not taking: Reported on 01/13/2022 06/20/19   Traci Sermon, PA-C  fluticasone [facility]) 50 MCG/ACT nasal spray Place 1 spray into both nostrils daily as needed for allergies or rhinitis.    [provider]  furosemide (LASIX) 20 MG tablet Take 20 mg by mouth daily.    [provider]  gabapentin (NEURONTIN) 300 MG capsule Take 300 mg by mouth 3 (three) times daily.    [provider]  losartan (COZAAR) 100 MG tablet Take 100 mg by mouth daily.    [provider]  lovastatin (MEVACOR) 40 MG tablet Take 40 mg by mouth every evening.     [provider]  methocarbamol (ROBAXIN) 750 MG tablet Take 750 mg by mouth 2 (two) times a day. Patient not taking: Reported on 01/13/2022    [provider]  oxyCODONE-acetaminophen (PERCOCET) 7.5-325 MG tablet Take 1 tablet by mouth every 4 (four) hours as needed for severe pain.    [provider]  potassium chloride (K-DUR) 10 MEQ tablet Take 20 mEq by mouth daily.     [provider]  Vitamin D, Ergocalciferol, (DRISDOL) 1.25 MG (50000 UNIT) CAPS capsule Take 50,000 Units by mouth once a week. 02/04/20   [provider]    ___________________________________________________________________________________________________ Physical Exam: Vitals with BMI 01/13/2022 03/19/2020 03/19/2020  Height 5\' 2"  - -  Weight 206 lbs - -  BMI 123456 - -  Systolic 123456 [id number]  Diastolic 77 68 70  Pulse 82 69 85    1. General:  in No  Acute distress    Chronically ill  -appearing 2. Psychological: Alert and  Oriented 3. Head/ENT:    Dry Mucous Membranes                          Head Non traumatic, neck supple                           Poor Dentition 4. SKIN:  decreased Skin turgor,  Skin clean Dry and intact no rash 5. Heart: Regular rate and rhythm no   Murmur, no Rub or gallop 6. Lungs: no wheezes or crackles   7. Abdomen: Soft,  non-tender,  distended obese, ventral hernia bowel sounds present 8. Lower extremities: no clubbing, cyanosis,  trace edema Right >left 9. Neurologically Grossly intact, moving all 4 extremities equally  10. MSK: Normal range of motion    Chart has been reviewed  ______________________________________________________________________________________________  Assessment/Plan 71 y.o. female with medical history significant of HTN, HLD, DDD  Admitted for discitis  Present on Admission:  Discitis of lumbar region  Essential hypertension  Chronic pain  HLD (hyperlipidemia)  Obesity (BMI 30-39.9)  Leg edema, right     Discitis of lumbar region Appreciate ID consult, blood cultures pending IR consult Hold off on ABX until sample obtained PICC line ordered Obtain echo to eval for vegitation  Obesity (BMI 30-39.9) chronic stable follow up as an outpt  Essential hypertension Resume home medications as able to tolerate  Chronic pain Pain management as needed  HLD (hyperlipidemia) Resume home medications  Leg edema, right Pt reports leg edema right worse than left Obtain dopplers  Other plan as per orders.  DVT prophylaxis:  SCD    Code Status:    Code Status: Prior FULL CODE as per patient   I had personally discussed CODE STATUS with patient Family Communication:   Family not  at  Bedside    Disposition Plan:     likely will Padilla placement for rehabilitation                       Following barriers for discharge:                                                         Pain controlled with PO medications                                                          Will Padilla to be able to tolerate PO                                                 Will Padilla consultants to evaluate patient prior to discharge                       Would benefit from PT/OT eval prior to DC  Ordered                                       Transition of care consulted                   Nutrition    consulted                  Consults called:  ID aware, sent msg to Dr. Linus Salmons  inpatient     I Expect 2 midnight stay secondary to severity of patient's current illness Padilla for inpatient interventions justified by the following:  Severe lab/radiological/exam abnormalities including:    discitis and extensive comorbidities including:  Chronic pain     That are currently affecting medical management.   I expect  patient to be hospitalized for 2 midnights requiring inpatient medical care.  Patient is at high risk for adverse outcome (such as loss of life or disability) if not treated.  Indication for inpatient stay as follows:   severe pain requiring acute inpatient management,  inability to maintain oral hydration    Padilla for operative/procedural  intervention    Padilla for IV pain medications,     Level of care    medical floor         Precautions: admitted as  asymptomatic screening protocol   Thurman Sarver 01/15/2022, 1:15 AM    [facility]     after 2 AM please page floor coverage PA If 7AM-7PM, please contact the day team taking care of the patient using [email or web address]   Patient was evaluated in the context of the global COVID-19 pandemic, which necessitated consideration that the patient might be at risk for infection with the SARS-CoV-2 virus that causes COVID-19. Institutional protocols and algorithms that pertain to the evaluation of patients at risk for COVID-19 are in a state of rapid change based on information released by regulatory  bodies including the CDC and federal and state organizations. These policies and algorithms were followed during the patient's care.

## 2022-01-15 ENCOUNTER — Inpatient Hospital Stay (HOSPITAL_COMMUNITY): Payer: Medicare Other

## 2022-01-15 ENCOUNTER — Other Ambulatory Visit (HOSPITAL_COMMUNITY): Payer: Self-pay

## 2022-01-15 ENCOUNTER — Inpatient Hospital Stay: Payer: Self-pay

## 2022-01-15 ENCOUNTER — Other Ambulatory Visit: Payer: Self-pay

## 2022-01-15 DIAGNOSIS — G8929 Other chronic pain: Secondary | ICD-10-CM | POA: Diagnosis not present

## 2022-01-15 DIAGNOSIS — M4646 Discitis, unspecified, lumbar region: Secondary | ICD-10-CM | POA: Diagnosis not present

## 2022-01-15 DIAGNOSIS — R509 Fever, unspecified: Secondary | ICD-10-CM

## 2022-01-15 DIAGNOSIS — R600 Localized edema: Secondary | ICD-10-CM | POA: Diagnosis present

## 2022-01-15 DIAGNOSIS — E785 Hyperlipidemia, unspecified: Secondary | ICD-10-CM | POA: Diagnosis not present

## 2022-01-15 DIAGNOSIS — R609 Edema, unspecified: Secondary | ICD-10-CM

## 2022-01-15 DIAGNOSIS — I10 Essential (primary) hypertension: Secondary | ICD-10-CM | POA: Diagnosis not present

## 2022-01-15 HISTORY — PX: IR LUMBAR DISC ASPIRATION W/IMG GUIDE: IMG5306

## 2022-01-15 LAB — CBC WITH DIFFERENTIAL/PLATELET
Abs Immature Granulocytes: 0.01 10*3/uL (ref 0.00–0.07)
Basophils Absolute: 0 10*3/uL (ref 0.0–0.1)
Basophils Relative: 1 %
Eosinophils Absolute: 0.2 10*3/uL (ref 0.0–0.5)
Eosinophils Relative: 4 %
HCT: 34.4 % — ABNORMAL LOW (ref 36.0–46.0)
Hemoglobin: 10.9 g/dL — ABNORMAL LOW (ref 12.0–15.0)
Immature Granulocytes: 0 %
Lymphocytes Relative: 45 %
Lymphs Abs: 2.7 10*3/uL (ref 0.7–4.0)
MCH: 24.1 pg — ABNORMAL LOW (ref 26.0–34.0)
MCHC: 31.7 g/dL (ref 30.0–36.0)
MCV: 76.1 fL — ABNORMAL LOW (ref 80.0–100.0)
Monocytes Absolute: 0.6 10*3/uL (ref 0.1–1.0)
Monocytes Relative: 10 %
Neutro Abs: 2.4 10*3/uL (ref 1.7–7.7)
Neutrophils Relative %: 40 %
Platelets: 280 10*3/uL (ref 150–400)
RBC: 4.52 MIL/uL (ref 3.87–5.11)
RDW: 15.3 % (ref 11.5–15.5)
WBC: 5.9 10*3/uL (ref 4.0–10.5)
nRBC: 0 % (ref 0.0–0.2)

## 2022-01-15 LAB — ECHOCARDIOGRAM COMPLETE
AR max vel: 1.71 cm2
AV Area VTI: 1.72 cm2
AV Area mean vel: 1.68 cm2
AV Mean grad: 4 mmHg
AV Peak grad: 7.8 mmHg
Ao pk vel: 1.4 m/s
Area-P 1/2: 3.3 cm2
S' Lateral: 3.2 cm

## 2022-01-15 LAB — MAGNESIUM: Magnesium: 2.2 mg/dL (ref 1.7–2.4)

## 2022-01-15 LAB — COMPREHENSIVE METABOLIC PANEL
ALT: 17 U/L (ref 0–44)
AST: 21 U/L (ref 15–41)
Albumin: 3.7 g/dL (ref 3.5–5.0)
Alkaline Phosphatase: 110 U/L (ref 38–126)
Anion gap: 11 (ref 5–15)
BUN: 11 mg/dL (ref 8–23)
CO2: 25 mmol/L (ref 22–32)
Calcium: 8.9 mg/dL (ref 8.9–10.3)
Chloride: 107 mmol/L (ref 98–111)
Creatinine, Ser: 0.69 mg/dL (ref 0.44–1.00)
GFR, Estimated: >60 mL/min (ref >60)
Glucose, Bld: 117 mg/dL — ABNORMAL HIGH (ref 70–99)
Potassium: 3.2 mmol/L — ABNORMAL LOW (ref 3.5–5.1)
Sodium: 143 mmol/L (ref 135–145)
Total Bilirubin: 0.5 mg/dL (ref 0.3–1.2)
Total Protein: 6.3 g/dL — ABNORMAL LOW (ref 6.5–8.1)

## 2022-01-15 LAB — FOLATE: Folate: 6.6 ng/mL (ref >5.9)

## 2022-01-15 LAB — CSF CELL COUNT WITH DIFFERENTIAL
RBC Count, CSF: 1210 /mm3 — ABNORMAL HIGH
WBC, CSF: 1 /mm3 (ref 0–5)

## 2022-01-15 LAB — RETICULOCYTES
Immature Retic Fract: 28.3 % — ABNORMAL HIGH (ref 2.3–15.9)
RBC.: 4.55 MIL/uL (ref 3.87–5.11)
Retic Count, Absolute: 73.7 10*3/uL (ref 19.0–186.0)
Retic Ct Pct: 1.6 % (ref 0.4–3.1)

## 2022-01-15 LAB — IRON AND TIBC
Iron: 47 ug/dL (ref 28–170)
Saturation Ratios: 14 % (ref 10.4–31.8)
TIBC: 340 ug/dL (ref 250–450)
UIBC: 293 ug/dL

## 2022-01-15 LAB — SEDIMENTATION RATE: Sed Rate: 12 mm/hr (ref 0–22)

## 2022-01-15 LAB — FERRITIN: Ferritin: 19 ng/mL (ref 11–307)

## 2022-01-15 LAB — PHOSPHORUS: Phosphorus: 3.7 mg/dL (ref 2.5–4.6)

## 2022-01-15 LAB — RESP PANEL BY RT-PCR (FLU A&B, COVID) ARPGX2
Influenza A by PCR: NEGATIVE
Influenza B by PCR: NEGATIVE
SARS Coronavirus 2 by RT PCR: NEGATIVE

## 2022-01-15 LAB — VITAMIN B12: Vitamin B-12: 391 pg/mL (ref 180–914)

## 2022-01-15 LAB — TSH: TSH: 1.137 u[IU]/mL (ref 0.350–4.500)

## 2022-01-15 MED ORDER — SODIUM CHLORIDE 0.9 % IV SOLN
1.0000 g | Freq: Once | INTRAVENOUS | Status: AC
  Administered 2022-01-15: 1 g via INTRAVENOUS
  Filled 2022-01-15: qty 1

## 2022-01-15 MED ORDER — BUPIVACAINE HCL (PF) 0.5 % IJ SOLN
INTRAMUSCULAR | Status: AC
  Filled 2022-01-15: qty 30

## 2022-01-15 MED ORDER — OXYCODONE-ACETAMINOPHEN 7.5-325 MG PO TABS
1.0000 | ORAL_TABLET | ORAL | Status: DC | PRN
  Administered 2022-01-15 – 2022-01-20 (×13): 1 via ORAL
  Filled 2022-01-15 (×13): qty 1

## 2022-01-15 MED ORDER — VANCOMYCIN HCL 1500 MG/300ML IV SOLN
1500.0000 mg | Freq: Once | INTRAVENOUS | Status: AC
  Administered 2022-01-15: 1500 mg via INTRAVENOUS
  Filled 2022-01-15: qty 300

## 2022-01-15 MED ORDER — MORPHINE SULFATE (PF) 2 MG/ML IV SOLN: 2.0000 mg | INTRAVENOUS | Status: DC | PRN

## 2022-01-15 MED ORDER — SODIUM CHLORIDE 0.9 % IV SOLN: INTRAVENOUS | Status: AC

## 2022-01-15 MED ORDER — POTASSIUM CHLORIDE 10 MEQ/100ML IV SOLN
10.0000 meq | INTRAVENOUS | Status: DC
  Filled 2022-01-15 (×2): qty 100

## 2022-01-15 MED ORDER — SODIUM CHLORIDE 0.9 % IV SOLN
1.0000 g | Freq: Three times a day (TID) | INTRAVENOUS | Status: DC
  Administered 2022-01-16 – 2022-01-20 (×14): 1 g via INTRAVENOUS
  Filled 2022-01-15 (×15): qty 1

## 2022-01-15 MED ORDER — MIDAZOLAM HCL 2 MG/2ML IJ SOLN
INTRAMUSCULAR | Status: AC
  Filled 2022-01-15: qty 2

## 2022-01-15 MED ORDER — ACETAMINOPHEN 325 MG PO TABS: 650.0000 mg | ORAL_TABLET | Freq: Four times a day (QID) | ORAL | Status: DC | PRN

## 2022-01-15 MED ORDER — LOSARTAN POTASSIUM 50 MG PO TABS
100.0000 mg | ORAL_TABLET | Freq: Every day | ORAL | Status: DC
  Administered 2022-01-15 – 2022-01-20 (×6): 100 mg via ORAL
  Filled 2022-01-15 (×6): qty 2

## 2022-01-15 MED ORDER — CLONIDINE HCL 0.1 MG PO TABS
0.1000 mg | ORAL_TABLET | Freq: Two times a day (BID) | ORAL | Status: DC
  Administered 2022-01-15 – 2022-01-20 (×12): 0.1 mg via ORAL
  Filled 2022-01-15 (×12): qty 1

## 2022-01-15 MED ORDER — VANCOMYCIN HCL IN DEXTROSE 1-5 GM/200ML-% IV SOLN
1000.0000 mg | INTRAVENOUS | Status: DC
  Administered 2022-01-16 – 2022-01-19 (×4): 1000 mg via INTRAVENOUS
  Filled 2022-01-15 (×5): qty 200

## 2022-01-15 MED ORDER — SENNOSIDES-DOCUSATE SODIUM 8.6-50 MG PO TABS
1.0000 | ORAL_TABLET | Freq: Every day | ORAL | Status: DC
  Administered 2022-01-15: 1 via ORAL
  Filled 2022-01-15: qty 1

## 2022-01-15 MED ORDER — MIDAZOLAM HCL 2 MG/2ML IJ SOLN
INTRAMUSCULAR | Status: AC | PRN
  Administered 2022-01-15 (×2): 1 mg via INTRAVENOUS

## 2022-01-15 MED ORDER — ACETAMINOPHEN 650 MG RE SUPP: 650.0000 mg | Freq: Four times a day (QID) | RECTAL | Status: DC | PRN

## 2022-01-15 MED ORDER — FENTANYL CITRATE (PF) 100 MCG/2ML IJ SOLN
INTRAMUSCULAR | Status: AC
  Filled 2022-01-15: qty 2

## 2022-01-15 MED ORDER — FENTANYL CITRATE (PF) 100 MCG/2ML IJ SOLN
INTRAMUSCULAR | Status: AC | PRN
  Administered 2022-01-15 (×2): 25 ug via INTRAVENOUS

## 2022-01-15 MED ORDER — HYDROCODONE-ACETAMINOPHEN 5-325 MG PO TABS
1.0000 | ORAL_TABLET | ORAL | Status: DC | PRN
  Administered 2022-01-15: 2 via ORAL
  Filled 2022-01-15: qty 2

## 2022-01-15 MED ORDER — CYCLOBENZAPRINE HCL 10 MG PO TABS
10.0000 mg | ORAL_TABLET | Freq: Three times a day (TID) | ORAL | Status: DC | PRN
  Administered 2022-01-16 – 2022-01-19 (×3): 10 mg via ORAL
  Filled 2022-01-15 (×3): qty 1

## 2022-01-15 MED ORDER — SODIUM CHLORIDE 0.9 % IV SOLN
75.0000 mL/h | INTRAVENOUS | Status: DC
  Administered 2022-01-15: 75 mL/h via INTRAVENOUS

## 2022-01-15 MED ORDER — SENNOSIDES-DOCUSATE SODIUM 8.6-50 MG PO TABS: 1.0000 | ORAL_TABLET | Freq: Every evening | ORAL | Status: DC | PRN

## 2022-01-15 MED ORDER — PRAVASTATIN SODIUM 40 MG PO TABS
40.0000 mg | ORAL_TABLET | Freq: Every day | ORAL | Status: DC
  Administered 2022-01-15 – 2022-01-19 (×5): 40 mg via ORAL
  Filled 2022-01-15 (×6): qty 1

## 2022-01-15 MED ORDER — POTASSIUM CHLORIDE CRYS ER 20 MEQ PO TBCR
40.0000 meq | EXTENDED_RELEASE_TABLET | Freq: Once | ORAL | Status: AC
  Administered 2022-01-15: 40 meq via ORAL
  Filled 2022-01-15: qty 2

## 2022-01-15 MED ORDER — GABAPENTIN 300 MG PO CAPS
300.0000 mg | ORAL_CAPSULE | Freq: Three times a day (TID) | ORAL | Status: DC
  Administered 2022-01-15 – 2022-01-20 (×16): 300 mg via ORAL
  Filled 2022-01-15 (×16): qty 1

## 2022-01-15 NOTE — Consult Note (Signed)
[facility]         Reason for Consult:discitis   Referring Physician: [email or web address]  Active Problems:   Obesity (BMI 30-39.9)   Discitis of lumbar region   Essential hypertension   Chronic pain   HLD (hyperlipidemia)   Leg edema, right    HPI: Hayley Padilla is a 71 y.o. female with past medical history of HTN, HLD, with osteoarthritis on chronic opiates having increasing back pain, falls, and occasional loss of bladder function. [facility] where she is found to have vental epidural phelgmon at L1-L2 measuring 1.6 x 1 x 2.3cm. she did have hx of L4-L5 fusion in 2011 at the [facility]. She reports having throat swelling to antibiotics as an adult. Does not recall being exposed to cephalosporins. She underwent IR aspiration today.  Past Medical History:  Diagnosis Date   Allergy    Arthritis    Hyperlipidemia    Hypertension    PONV (postoperative nausea and vomiting)     Allergies:  Allergies  Allergen Reactions   Penicillins Anaphylaxis    Did it involve swelling of the face/tongue/throat, SOB, or low BP? Yes Did it involve sudden or severe rash/hives, skin peeling, or any reaction on the inside of your mouth or nose? No Did you need to seek medical attention at a hospital or doctor's office? Yes When did it last happen?      "Been a While"  If all above answers are NO, may proceed with cephalosporin use.    Aspirin Other (See Comments)    brusing _ patient requested to be listed    MEDICATIONS:  bupivacaine(PF)       cloNIDine  0.1 mg Oral BID   fentaNYL       gabapentin  300 mg Oral TID   losartan  100 mg Oral Daily   midazolam       pravastatin  40 mg Oral q1800   senna-docusate  1 tablet Oral QHS    Social History   Tobacco Use   Smoking status: Never   Smokeless tobacco: Current    Types: Chew   Tobacco comments:    last used 03/17/2020 @ 2200  Vaping Use   Vaping Use: Never used   Substance Use Topics   Alcohol use: Not Currently   Drug use: Never    Family History  Problem Relation Age of Onset   Colon cancer Neg Hx    Colon polyps Neg Hx    Esophageal cancer Neg Hx    Rectal cancer Neg Hx    Stomach cancer Neg Hx     Review of Systems - 12 point ros is negative except what is mentioned in hpi  OBJECTIVE: Temp:  [98.2 F (36.8 C)-100 F (37.8 C)] 98.2 F (36.8 C) (02/02 1510) Pulse Rate:  [70-90] 82 (02/02 1510) Resp:  [10-20] 18 (02/02 1510) BP: (112-183)/(52-93) 137/65 (02/02 1510) SpO2:  [95 %-100 %] 100 % (02/02 1510) Physical Exam  Constitutional:  oriented to person, place, and time. appears well-developed and well-nourished. No distress.  HENT: [place]/AT, PERRLA, no scleral icterus Mouth/Throat: Oropharynx is clear and moist. No oropharyngeal exudate.  Cardiovascular: Normal rate, regular rhythm and normal heart sounds. Exam reveals no gallop and no friction rub.  No murmur heard.  Pulmonary/Chest: Effort normal and breath sounds normal. No respiratory distress.  has no wheezes.  Neck = supple, no nuchal rigidity Abdominal: Soft. Bowel  sounds are normal.  exhibits no distension. There is no tenderness.  Lymphadenopathy: no cervical adenopathy. No axillary adenopathy Neurological: alert and oriented to person, place, and time.  Skin: Skin is warm and dry. No rash noted. No erythema.  Psychiatric: a normal mood and affect.  behavior is normal.    LABS: Results for orders placed or performed during the hospital encounter of 01/14/22 (from the past 48 hour(s))  Resp Panel by RT-PCR (Flu A&B, Covid) Nasopharyngeal Swab     Status: None   Collection Time: 01/14/22 11:00 PM   Specimen: Nasopharyngeal Swab; Nasopharyngeal(NP) swabs in vial transport medium  Result Value Ref Range   SARS Coronavirus 2 by RT PCR NEGATIVE NEGATIVE    Comment: (NOTE) SARS-CoV-2 target nucleic acids are NOT DETECTED.  The SARS-CoV-2 RNA is generally detectable in  upper respiratory specimens during the acute phase of infection. The lowest concentration of SARS-CoV-2 viral copies this assay can detect is 138 copies/mL. A negative result does not preclude SARS-Cov-2 infection and should not be used as the sole basis for treatment or other patient management decisions. A negative result may occur with  improper specimen collection/handling, submission of specimen other than nasopharyngeal swab, presence of viral mutation(s) within the areas targeted by this assay, and inadequate number of viral copies(<138 copies/mL). A negative result must be combined with clinical observations, patient history, and epidemiological information. The expected result is Negative.  Fact Sheet for Patients:  BloggerCourse.com  Fact Sheet for Healthcare Providers:  SeriousBroker.it  This test is no t yet approved or cleared by the [place] FDA and  has been authorized for detection and/or diagnosis of SARS-CoV-2 by FDA under an Emergency Use Authorization (EUA). This EUA will remain  in effect (meaning this test can be used) for the duration of the COVID-19 declaration under Section 564(b)(1) of the Act, 21 U.S.C.section 360bbb-3(b)(1), unless the authorization is terminated  or revoked sooner.       Influenza A by PCR NEGATIVE NEGATIVE   Influenza B by PCR NEGATIVE NEGATIVE    Comment: (NOTE) The Xpert Xpress SARS-CoV-2/FLU/RSV plus assay is intended as an aid in the diagnosis of influenza from Nasopharyngeal swab specimens and should not be used as a sole basis for treatment. Nasal washings and aspirates are unacceptable for Xpert Xpress SARS-CoV-2/FLU/RSV testing.  Fact Sheet for Patients: BloggerCourse.com  Fact Sheet for Healthcare Providers: SeriousBroker.it  This test is not yet approved or cleared by the [place] FDA and has been authorized  for detection and/or diagnosis of SARS-CoV-2 by FDA under an Emergency Use Authorization (EUA). This EUA will remain in effect (meaning this test can be used) for the duration of the COVID-19 declaration under Section 564(b)(1) of the Act, 21 U.S.C. section 360bbb-3(b)(1), unless the authorization is terminated or revoked.  Performed at [facility], [address] [place], [place] [address]   CBC with Differential/Platelet     Status: Abnormal   Collection Time: 01/15/22  2:45 AM  Result Value Ref Range   WBC 5.9 4.0 - 10.5 K/uL   RBC 4.52 3.87 - 5.11 MIL/uL   Hemoglobin 10.9 (L) 12.0 - 15.0 g/dL   HCT 60.4 (L) 54.0 - 98.1 %   MCV 76.1 (L) 80.0 - 100.0 fL   MCH 24.1 (L) 26.0 - 34.0 pg   MCHC 31.7 30.0 - 36.0 g/dL   RDW 19.1 47.8 - 29.5 %   Platelets 280 150 - 400 K/uL   nRBC 0.0 0.0 - 0.2 %  Neutrophils Relative % 40 %   Neutro Abs 2.4 1.7 - 7.7 K/uL   Lymphocytes Relative 45 %   Lymphs Abs 2.7 0.7 - 4.0 K/uL   Monocytes Relative 10 %   Monocytes Absolute 0.6 0.1 - 1.0 K/uL   Eosinophils Relative 4 %   Eosinophils Absolute 0.2 0.0 - 0.5 K/uL   Basophils Relative 1 %   Basophils Absolute 0.0 0.0 - 0.1 K/uL   Immature Granulocytes 0 %   Abs Immature Granulocytes 0.01 0.00 - 0.07 K/uL    Comment: Performed at [facility], [address], [place], [place] [address]  Vitamin B12     Status: None   Collection Time: 01/15/22  2:45 AM  Result Value Ref Range   Vitamin B-12 391 180 - 914 pg/mL    Comment: (NOTE) This assay is not validated for testing neonatal or myeloproliferative syndrome specimens for Vitamin B12 levels. Performed at [facility], [address] [place], [place] [address]   Iron and TIBC     Status: None   Collection Time: 01/15/22  2:45 AM  Result Value Ref Range   Iron 47 28 - 170 ug/dL   TIBC 174 081 - 448 ug/dL   Saturation Ratios 14 10.4 - 31.8 %   UIBC 293 ug/dL    Comment: Performed at [facility], [address], [place], [place] [address]  Ferritin     Status: None   Collection Time: 01/15/22  2:45 AM  Result Value Ref Range   Ferritin 19 11 - 307 ng/mL    Comment: Performed at [facility], [address] [place], [place] [address]  Reticulocytes     Status: Abnormal   Collection Time: 01/15/22  2:45 AM  Result Value Ref Range   Retic Ct Pct 1.6 0.4 - 3.1 %   RBC. 4.55 3.87 - 5.11 MIL/uL   Retic Count, Absolute 73.7 19.0 - 186.0 K/uL   Immature Retic Fract 28.3 (H) 2.3 - 15.9 %    Comment: Performed at [facility], [address] [place], [place] [address]  Magnesium     Status: None   Collection Time: 01/15/22  2:45 AM  Result Value Ref Range   Magnesium 2.2 1.7 - 2.4 mg/dL    Comment: Performed at [facility], [address], [place], [place] [address]  Phosphorus     Status: None   Collection Time: 01/15/22  2:45 AM  Result Value Ref Range   Phosphorus 3.7 2.5 - 4.6 mg/dL    Comment: Performed at [facility], [address] [place], [place] [address]  Comprehensive metabolic panel     Status: Abnormal   Collection Time: 01/15/22  2:45 AM  Result Value Ref Range   Sodium 143 135 - 145 mmol/L   Potassium 3.2 (L) 3.5 - 5.1 mmol/L   Chloride 107 98 - 111 mmol/L   CO2 25 22 - 32 mmol/L   Glucose, Bld 117 (H) 70 - 99 mg/dL    Comment: Glucose reference range applies only to samples taken after fasting for at least 8 hours.   BUN 11 8 - 23 mg/dL   Creatinine, Ser 2.87 0.44 - 1.00 mg/dL   Calcium 8.9 8.9 - 86.7 mg/dL   Total Protein 6.3 (L) 6.5 - 8.1 g/dL   Albumin 3.7 3.5 - 5.0 g/dL   AST 21 15 - 41 U/L   ALT 17 0 -  44 U/L   Alkaline Phosphatase 110 38 - 126 U/L   Total Bilirubin 0.5 0.3 - 1.2 mg/dL   GFR, Estimated >96 >29 mL/min    Comment: (NOTE) Calculated using the CKD-EPI Creatinine Equation (2021)    Anion gap 11 5 - 15    Comment: Performed at [facility], [address] [place], [place] [address]  Folate     Status: None   Collection  Time: 01/15/22  2:45 AM  Result Value Ref Range   Folate 6.6 >5.9 ng/mL    Comment: Performed at [facility], [address], [place], [place] [address]  TSH     Status: None   Collection Time: 01/15/22  2:45 AM  Result Value Ref Range   TSH 1.137 0.350 - 4.500 uIU/mL    Comment: Performed by a 3rd Generation assay with a functional sensitivity of <=0.01 uIU/mL. Performed at [facility], [address] [place], [place] [address]   Sedimentation rate     Status: None   Collection Time: 01/15/22  5:59 AM  Result Value Ref Range   Sed Rate 12 0 - 22 mm/hr    Comment: Performed at [facility], [address], [place], [place] [address]  CSF cell count with differential     Status: Abnormal   Collection Time: 01/15/22 11:45 AM  Result Value Ref Range   Tube # SAMPLE SUBMITTED IN CUP    Color, CSF PINK (A) COLORLESS   Appearance, CSF CLEAR CLEAR   Supernatant COLORLESS    RBC Count, CSF 1,210 (H) 0 /cu mm   WBC, CSF 1 0 - 5 /cu mm   Other Cells, CSF TOO FEW TO COUNT, SMEAR AVAILABLE FOR REVIEW     Comment: RARE LYMPHOCYTE SEEN Performed at [facility], [address], [place], [place] [address]     MICRO: Blood cx 1/31 ngtd Ir aspirate 2/2 pending MSSA colonized IMAGING: ECHOCARDIOGRAM COMPLETE  Result Date: 01/15/2022    ECHOCARDIOGRAM REPORT   Patient Name:   Hayley Padilla Date of Exam: 01/15/2022 Medical Rec #:  [id number]     Height:       62.0 in Accession #:    [id number]    Weight:       206.0 lb Date of Birth:  August 26, 1951    BSA:          1.936 m Patient Age:    70 years      BP:           115/52 mmHg Patient Gender: F             HR:           72 bpm. Exam Location:  Inpatient Procedure: 2D Echo Indications:    Fever  History:        Patient has no prior history of Echocardiogram examinations.                 Risk Factors:Hypertension and Dyslipidemia.  Sonographer:    Devonne Doughty Referring Phys: [id number] ANASTASSIA DOUTOVA  Sonographer Comments: Image  acquisition challenging due to patient body habitus. IMPRESSIONS  1. Left ventricular ejection fraction, by estimation, is 65 to 70%. The left ventricle has normal function. The left ventricle has no regional wall motion abnormalities. Left ventricular diastolic parameters were normal.  2. Right ventricular systolic function is normal. The right ventricular size is normal. There is normal pulmonary artery systolic pressure.  3. Mild mitral valve regurgitation.  4. The aortic valve is normal in structure. Aortic valve regurgitation is not visualized.  5. The inferior vena cava is normal in size with greater than 50% respiratory variability, suggesting right atrial pressure of 3 mmHg. FINDINGS  Left Ventricle: Left ventricular ejection fraction, by estimation, is 65 to 70%. The left ventricle has normal function. The left ventricle has no regional wall motion abnormalities. The left ventricular internal cavity size was normal in size. There is  no left ventricular hypertrophy. Left ventricular diastolic parameters were normal. Right Ventricle: The right ventricular size is normal. Right vetricular wall thickness was not assessed. Right ventricular systolic function is normal. There is normal pulmonary artery systolic pressure. The tricuspid regurgitant velocity is 1.93 m/s, and with an assumed right atrial pressure of 3 mmHg, the estimated right ventricular systolic pressure is 17.9 mmHg. Left Atrium: Left atrial size was normal in size. Right Atrium: Right atrial size was normal in size. Pericardium: There is no evidence of pericardial effusion. Mitral Valve: There is mild thickening of the mitral valve leaflet(s). Mild mitral valve regurgitation. Tricuspid Valve: The tricuspid valve is normal in structure. Tricuspid valve regurgitation is trivial. Aortic Valve: The aortic valve is normal in structure. Aortic valve regurgitation is not visualized. Aortic valve mean gradient measures 4.0 mmHg. Aortic valve peak  gradient measures 7.8 mmHg. Aortic valve area, by VTI measures 1.72 cm. Pulmonic Valve: The pulmonic valve was grossly normal. Pulmonic valve regurgitation is not visualized. Aorta: The aortic root is normal in size and structure. Venous: The inferior vena cava is normal in size with greater than 50% respiratory variability, suggesting right atrial pressure of 3 mmHg. IAS/Shunts: No atrial level shunt detected by color flow Doppler.  LEFT VENTRICLE PLAX 2D LVIDd:         4.60 cm   Diastology LVIDs:         3.20 cm   LV e' medial:    6.74 cm/s LV PW:         1.00 cm   LV E/e' medial:  8.0 LV IVS:        1.00 cm   LV e' lateral:   9.46 cm/s LVOT diam:     1.90 cm   LV E/e' lateral: 5.7 LV SV:         55 LV SV Index:   29 LVOT Area:     2.84 cm  RIGHT VENTRICLE            IVC RV Basal diam:  2.50 cm    IVC diam: 1.80 cm RV Mid diam:    1.90 cm RV S prime:     8.92 cm/s TAPSE (M-mode): 1.8 cm LEFT ATRIUM             Index        RIGHT ATRIUM           Index LA diam:        3.60 cm 1.86 cm/m   RA Area:     12.80 cm LA Vol (A2C):   51.5 ml 26.60 ml/m  RA Volume:   25.50 ml  13.17 ml/m LA Vol (A4C):   49.4 ml 25.52 ml/m LA Biplane Vol: 51.6 ml 26.65 ml/m  AORTIC VALVE AV Area (Vmax):    1.71 cm AV Area (Vmean):   1.68 cm AV Area (VTI):     1.72 cm AV Vmax:           140.00 cm/s AV  Vmean:          96.000 cm/s AV VTI:            0.321 m AV Peak Grad:      7.8 mmHg AV Mean Grad:      4.0 mmHg LVOT Vmax:         84.40 cm/s LVOT Vmean:        57.000 cm/s LVOT VTI:          0.195 m LVOT/AV VTI ratio: 0.61  AORTA Ao Root diam: 2.60 cm Ao Asc diam:  2.70 cm MITRAL VALVE               TRICUSPID VALVE MV Area (PHT): 3.30 cm    TR Peak grad:   14.9 mmHg MV Decel Time: 230 msec    TR Vmax:        193.00 cm/s MV E velocity: 53.60 cm/s MV A velocity: 87.40 cm/s  SHUNTS MV E/A ratio:  0.61        Systemic VTI:  0.20 m                            Systemic Diam: 1.90 cm Dietrich PatesPaula Ross MD Electronically signed by Dietrich PatesPaula Ross MD  Signature Date/Time: 01/15/2022/11:35:00 AM    Final    US EKG SITE RITE  Result Date: 01/15/2022 If Site Rite image not attached, placement could not be confirmed due to current cardiac rhythm.  IMPRESSION: 1. 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 1.6 x 1 x 2.3 cm heterogeneous signal along the left ventral epidural space concerning for a phlegmon with small areas of non enhancement likely reflecting a developing abscess. Mass effect on the left intraspinal L2 nerve root. Severe central canal stenosis. Moderate left foraminal stenosis. Severe bilateral facet arthropathy.    Assessment/Plan:  71yo F with lumbar discitis-osteomyelitis. Cultures pending - plan to start on vancomycin plus meropenem - will follow up on blood cx and disc aspirate before placing picc line - anticipate to treat for minimum of 6 wk then convert to oral abtx if needed - will likely need acute rehab given recent falls  Iley Deignan B. Drue SecondSnider MD MPH [facility] [phone]

## 2022-01-15 NOTE — Procedures (Signed)
INR. Fluoro guided L1-L2  disc aspiration.  Approx 48ml of thick blood stained aspirate obtained  and sent for microbiological analysis.  S.Carmisha Larusso MD.

## 2022-01-15 NOTE — Sedation Documentation (Signed)
Vital signs stable. 

## 2022-01-15 NOTE — Consult Note (Signed)
Chief Complaint: Patient was seen in consultation today for L1-2 discitis  Referring Physician(s): Therisa Doyne, MD  Supervising Physician: Julieanne Cotton  Patient Status: [name] - In-pt  History of Present Illness: Hayley Padilla is a 71 y.o. female with a past medical history significant for arthritis, HLD, HTN, DDD s/p L4-5 spinal fusion (2011 at the [place]), L2-4 fusion revision (200 Dr. Jordan Likes) who was admitted yesterday due to concerns for discitis/osteomyelitis. Ms. Bensen was seen by Dr. Retia Passe in late January of this year for worsening back pain, she had an MRI during that visit which was notable for discitis/osteomyelitis at L1-2. She was seen by ID on 01/13/22 and recommendation was made for PICC placement and disc aspiration in IR, however the patient felt that she would need SNF due to frequent falls and inability to manage PICC. She was admitted yesterday evening when [facility] bed became available and IR has been consulted for L1-2 disc aspiration.  Patient seen at bedside, she has ongoing back pain that is no better or worse today. She also has right knee pain which is chronic and unchanged. She has some numbness and tingling from her thigh to her foot on the right side which has been present for awhile and is unchanged. She has trouble walking and falls frequently at home due to back and knee pain. She understands the procedure today and is agreeable to proceed.  Past Medical History:  Diagnosis Date   Allergy    Arthritis    Hyperlipidemia    Hypertension    PONV (postoperative nausea and vomiting)     Past Surgical History:  Procedure Laterality Date   BACK SURGERY     lower back   MULTIPLE TOOTH EXTRACTIONS      Allergies: Penicillins and Aspirin  Medications: Prior to Admission medications   Medication Sig Start Date End Date Taking? Authorizing Provider  amLODipine (NORVASC) 10 MG tablet Take 10 mg by mouth daily.    [provider]  baclofen  (LIORESAL) 10 MG tablet Take 10 mg by mouth 2 (two) times daily. Patient not taking: Reported on 01/13/2022 01/17/20   [provider]  Calcium Carb-Cholecalciferol (CALCIUM 600/VITAMIN D3 PO) Take 1 tablet by mouth daily.    [provider]  cloNIDine (CATAPRES) 0.1 MG tablet Take 0.1 mg by mouth 2 (two) times daily.    [provider]  cyclobenzaprine (FLEXERIL) 10 MG tablet Take 10 mg by mouth 3 (three) times daily as needed for muscle spasms.    [provider]  diclofenac (VOLTAREN) 75 MG EC tablet Take 1 tablet (75 mg total) by mouth 2 (two) times daily. Patient not taking: Reported on 01/13/2022 06/20/19   Alyson Ingles, PA-C  fluticasone [facility]) 50 MCG/ACT nasal spray Place 1 spray into both nostrils daily as needed for allergies or rhinitis.    [provider]  furosemide (LASIX) 20 MG tablet Take 20 mg by mouth daily.    [provider]  gabapentin (NEURONTIN) 300 MG capsule Take 300 mg by mouth 3 (three) times daily.    [provider]  losartan (COZAAR) 100 MG tablet Take 100 mg by mouth daily.    [provider]  lovastatin (MEVACOR) 40 MG tablet Take 40 mg by mouth every evening.     [provider]  methocarbamol (ROBAXIN) 750 MG tablet Take 750 mg by mouth 2 (two) times a day. Patient not taking: Reported on 01/13/2022    [provider]  oxyCODONE-acetaminophen (  PERCOCET) 7.5-325 MG tablet Take 1 tablet by mouth every 4 (four) hours as needed for severe pain.    [provider]  potassium chloride (K-DUR) 10 MEQ tablet Take 20 mEq by mouth daily.     [provider]  Vitamin D, Ergocalciferol, (DRISDOL) 1.25 MG (50000 UNIT) CAPS capsule Take 50,000 Units by mouth once a week. 02/04/20   [provider]     Family History  Problem Relation Age of Onset   Colon cancer Neg Hx    Colon polyps Neg Hx    Esophageal cancer Neg Hx    Rectal cancer Neg Hx    Stomach  cancer Neg Hx     Social History   Socioeconomic History   Marital status: Widowed    Spouse name: Not on file   Number of children: Not on file   Years of education: Not on file   Highest education level: Not on file  Occupational History   Not on file  Tobacco Use   Smoking status: Never   Smokeless tobacco: Current    Types: Chew   Tobacco comments:    last used 03/17/2020 @ 2200  Vaping Use   Vaping Use: Never used  Substance and Sexual Activity   Alcohol use: Not Currently   Drug use: Never   Sexual activity: Not on file  Other Topics Concern   Not on file  Social History Narrative   Not on file   Social Determinants of Health   Financial Resource Strain: Not on file  Food Insecurity: Not on file  Transportation Needs: Not on file  Physical Activity: Not on file  Stress: Not on file  Social Connections: Not on file     Review of Systems: A 12 point ROS discussed and pertinent positives are indicated in the HPI above.  All other systems are negative.  Review of Systems  Constitutional:  Negative for chills and fever.  Respiratory:  Negative for cough and shortness of breath.   Cardiovascular:  Negative for chest pain.  Gastrointestinal:  Negative for abdominal pain, diarrhea, nausea and vomiting.  Genitourinary:  Negative for pelvic pain.  Musculoskeletal:  Positive for arthralgias (right knee), back pain and gait problem (frequent falls/hard to walk due to pain).  Neurological:  Positive for numbness (RLE). Negative for dizziness, light-headedness and headaches.   Vital Signs: BP 112/77 (BP Location: Left Arm)    Pulse 70    Temp 98.3 F (36.8 C) (Oral)    Resp 18    SpO2 98%   Physical Exam Vitals and nursing note reviewed.  Constitutional:      General: She is not in acute distress. HENT:     Head: Normocephalic.     Mouth/Throat:     Mouth: Mucous membranes are moist.     Pharynx: Oropharynx is clear. No oropharyngeal exudate or posterior  oropharyngeal erythema.  Cardiovascular:     Rate and Rhythm: Normal rate and regular rhythm.  Pulmonary:     Effort: Pulmonary effort is normal.     Breath sounds: Normal breath sounds.  Abdominal:     General: There is no distension.     Palpations: Abdomen is soft.     Tenderness: There is no abdominal tenderness.  Skin:    General: Skin is warm and dry.  Neurological:     Mental Status: She is alert and oriented to person, place, and time.  Psychiatric:        Mood and Affect:  Mood normal.        Behavior: Behavior normal.        Thought Content: Thought content normal.        Judgment: Judgment normal.     MD Evaluation Airway: WNL Heart: WNL Abdomen: WNL Chest/ Lungs: WNL ASA  Classification: 2 Mallampati/Airway Score: Two   Imaging: MR Lumbar Spine W Wo Contrast  Result Date: 01/12/2022 CLINICAL DATA:  Chronic low back pain. Left leg numbness. Prior surgery in 2020. EXAM: MRI LUMBAR SPINE WITHOUT AND WITH CONTRAST TECHNIQUE: Multiplanar and multiecho pulse sequences of the lumbar spine were obtained without and with intravenous contrast. CONTRAST:  23mL MULTIHANCE GADOBENATE DIMEGLUMINE 529 MG/ML IV SOLN COMPARISON:  06/25/2020 FINDINGS: Segmentation:  Standard. Alignment: Grade 1 anterolisthesis of L4 on L5 secondary to facet disease. Vertebrae: No acute fracture. Severe disc space loss at L1-2 with fluid within the disc space with enhancement. Marrow edema on either side of the L1-2 disc space. Overall findings are most consistent with discitis-osteomyelitis at L1-2. 1.6 x 1 x 2.3 cm heterogeneous signal along the left ventral epidural space concerning for a phlegmon with small areas of non enhancement likely reflecting a developing abscess. Mass effect on the left intraspinal L2 nerve root. Conus medullaris and cauda equina: Conus extends to the L1 level. Conus and cauda equina appear normal. Paraspinal and other soft tissues: No acute paraspinal abnormality. Disc levels:  Disc spaces: Posterior lumbar interbody fusion and decompression from L2 through L4. Degenerative disease with disc height loss at L5-S1 and L4-5. T12-L1: No significant disc bulge. No neural foraminal stenosis. No central canal stenosis. L1-L2: Severe disc space narrowing with fluid signal within the L1-2 disc space. 1.6 x 1 x 2.3 cm heterogeneous signal along the left ventral epidural space concerning for a phlegmon with small areas of non enhancement likely reflecting a developing abscess. Mass effect on the left intraspinal L2 nerve root. Severe central canal stenosis. Moderate left foraminal stenosis. Mild right foraminal stenosis. Severe bilateral facet arthropathy. L2-L3: Interbody fusion.  No foraminal or central canal stenosis. L3-L4: Interbody fusion. No foraminal or central canal stenosis. L4-L5: Broad-based disc bulge. Prior laminectomy. No foraminal or central canal stenosis. L5-S1: Mild broad-based disc bulge. Posterior decompression. Severe bilateral facet arthropathy. Moderate right foraminal stenosis. No left foraminal stenosis. IMPRESSION: 1. 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 1.6 x 1 x 2.3 cm heterogeneous signal along the left ventral epidural space concerning for a phlegmon with small areas of non enhancement likely reflecting a developing abscess. Mass effect on the left intraspinal L2 nerve root. Severe central canal stenosis. Moderate left foraminal stenosis. Severe bilateral facet arthropathy. Electronically Signed   By: Elige Ko M.D.   On: 01/12/2022 14:33   [place] EKG SITE RITE  Result Date: 01/15/2022 If [facility] image not attached, placement could not be confirmed due to current cardiac rhythm.   Labs:  CBC: Recent Labs    01/21/21 0000 01/13/22 0311 01/15/22 0245  WBC 5.2 5.1 5.9  HGB 10.0* 11.2* 10.9*  HCT 32.1* 37.3 34.4*  PLT 348 295 280    COAGS: No results for input(s): INR, APTT in the last 8760 hours.  BMP: Recent Labs    01/21/21 0000 01/13/22 0311  01/15/22 0245  NA 140 142 143  K 3.8 3.7 3.2*  CL 106 104 107  CO2 25 29 25   GLUCOSE 90 127* 117*  BUN 11 10 11   CALCIUM 9.0 9.3 8.9  CREATININE 0.67 0.92 0.69  GFRNONAA 88  --  >60  GFRAA 102  --   --     LIVER FUNCTION TESTS: Recent Labs    01/21/21 0000 01/13/22 0311 01/15/22 0245  BILITOT 0.4 0.5 0.5  AST 17 22 21   ALT 8 16 17   ALKPHOS  --   --  110  PROT 6.1 6.8 6.3*  ALBUMIN  --   --  3.7    TUMOR MARKERS: No results for input(s): AFPTM, CEA, CA199, CHROMGRNA in the last 8760 hours.  Assessment and Plan:  71 y/o F with history of chronic back pain and multiple surgeries found to have possible L1-2 discitis/osteomyelitis on recent MRI for worsening back pain seen today for L1-2 disc aspiration. Patient history and imaging reviewed by Dr. Corliss Skainseveshwar who approves patient for procedure -- will tentatively plan for procedure today in IR pending any emergent procedures.  Patient has been NPO since midnight, no blood thinners, VSS.  Risks and benefits of L1-2 disc aspiraiton was discussed with the patient and/or patient's family including, but not limited to bleeding, infection, damage to adjacent structures or low yield requiring additional tests.  All of the questions were answered and there is agreement to proceed.  Consent signed and in chart.  Thank you for this interesting consult.  I greatly enjoyed meeting Clarnce Flockllen L Maina and look forward to participating in their care.  A copy of this report was sent to the requesting provider on this date.  Electronically Signed: Villa HerbShannon A Vianka Ertel, PA-C 01/15/2022, 9:58 AM   I spent a total of 40 Minutes in face to face in clinical consultation, greater than 50% of which was counseling/coordinating care for L1-2 discitis/osteomyelitis.

## 2022-01-15 NOTE — Sedation Documentation (Signed)
Pt tolerated procedure very well.  Total: Time 15 mins Fentanyl Versed 2mg 

## 2022-01-15 NOTE — Progress Notes (Signed)
°  Transition of Care [facility]) Screening Note   Patient Details  Name: Hayley Padilla Date of Birth: 07/15/1951   Transition of Care [facility]) CM/SW Contact:    Kermit Balo, RN Phone Number: 01/15/2022, 1:57 PM    Transition of Care Department [facility]) has reviewed patient. We will continue to monitor patient advancement through interdisciplinary progression rounds. If new patient transition needs arise, please place a TOC consult.

## 2022-01-15 NOTE — Progress Notes (Signed)
? ?  Inpatient Rehab Admissions Coordinator : ? ?Per therapy recommendations, patient was screened for CIR candidacy by Arsema Tusing RN MSN.  At this time patient appears to be a potential candidate for CIR. I will place a rehab consult per protocol for full assessment. Please call me with any questions. ? ?Kassaundra Hair RN MSN ?Admissions Coordinator ?[phone] ?  ?

## 2022-01-15 NOTE — Assessment & Plan Note (Signed)
Resume home medications as able to tolerate

## 2022-01-15 NOTE — Assessment & Plan Note (Signed)
chronic stable follow up as an outpt

## 2022-01-15 NOTE — Assessment & Plan Note (Signed)
Resume home medications

## 2022-01-15 NOTE — Progress Notes (Signed)
Lower extremity venous RT study completed.   Please see CV Proc for preliminary results.   Mahmood Boehringer, RDMS, RVT  

## 2022-01-15 NOTE — Plan of Care (Signed)
Pt is alert oriented x 4. Pt is currently NPO.    Problem: Education: Goal: Knowledge of General Education information will improve Description: Including pain rating scale, medication(s)/side effects and non-pharmacologic comfort measures Outcome: Progressing   Problem: Health Behavior/Discharge Planning: Goal: Ability to manage health-related needs will improve Outcome: Progressing   Problem: Clinical Measurements: Goal: Ability to maintain clinical measurements within normal limits will improve Outcome: Progressing Goal: Will remain free from infection Outcome: Progressing Goal: Diagnostic test results will improve Outcome: Progressing Goal: Respiratory complications will improve Outcome: Progressing Goal: Cardiovascular complication will be avoided Outcome: Progressing   Problem: Activity: Goal: Risk for activity intolerance will decrease Outcome: Progressing   Problem: Nutrition: Goal: Adequate nutrition will be maintained Outcome: Progressing   Problem: Coping: Goal: Level of anxiety will decrease Outcome: Progressing

## 2022-01-15 NOTE — Assessment & Plan Note (Signed)
Pt reports leg edema right worse than left Obtain dopplers

## 2022-01-15 NOTE — Progress Notes (Signed)
Echocardiogram 2D Echocardiogram has been performed.  Arlyss Gandy 01/15/2022, 9:04 AM

## 2022-01-15 NOTE — Progress Notes (Signed)
PROGRESS NOTE  Hayley Padilla GEX:[id number] DOB: [birth year] DOA: 01/14/2022 PCP: Fleet Contras, MD  HPI/Recap of past 24 hours: Hayley Padilla is a 71 y.o. female with medical history significant of HTN, HLD, DDD, presented with worsening chronic back pain for the past couple of weeks, more recently developed progressive numbness and weakness in her right leg.  Of note, patient has had posterior spinal fusion at the L4-5 level in 2011 and also has had revision of fusion at the L2-L4 level.  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  Patient has had multiple falls recently and a chronic complaint of right lower extremity pain, swelling and some weakness.  Due to this patient went to see Dr. Bonnita Nasuti of infectious disease.  Patient was advised to go to the ER for admission and further management.    Today, patient continues to complain of back pain, RLE swelling and pain especially worse around the knee which is chronic.  Patient with known history of osteoarthritis and has had a left knee replacement done.     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 BC x2 NGTD Status post IR disc aspiration on 01/15/2022 Aerobic/anaerobic culture pending Echo showed EF of 65 to 70%, no regional wall motion  abnormalities, no vegetations visualized Appreciate ID consult, hold off on ABX until sample obtained, starting AB pending ID PICC line ordered Monitor closely  Hypokalemia Replace as needed  Essential hypertension Continue clonidine, losartan  HLD (hyperlipidemia) Continue statins  History of knee osteoarthritis/DJD/chronic pain Noted chronic pain, right leg edema worse than left S/p left knee replacement Venous Doppler to rule out DVT Pain management  Microcytic anemia Baseline hemoglobin 10-11 Anemia panel WNL Daily CBC   Obesity (BMI 30-39.9) Lifestyle modification advised       Estimated body mass index is 37.68 kg/m as calculated from the following:   Height as of 01/13/22: 5\' 2"  (1.575 m).   Weight as of 01/13/22: 93.4 kg.     Code Status: Full  Family Communication: None at bedside  Disposition Plan: Status is: Inpatient Remains inpatient appropriate because: Level of care     Consultants: IR ID  Procedures: IR disc aspiration on 01/15/2022  Antimicrobials: None for now  DVT prophylaxis: SCD, plan for lovenox soon   Objective: Vitals:   01/15/22 1130 01/15/22 1135 01/15/22 1145 01/15/22 1200  BP: (!) 166/93 (!) 148/86 (!) 163/89 (!) 152/76  Pulse: 87 87 81 76  Resp: 20 20 20 18   Temp:    98.2 F (36.8 C)  TempSrc:      SpO2: 100% 98% 98% 100%    Intake/Output Summary (Last 24 hours) at 01/15/2022 1407 Last data filed at 01/15/2022 0400 Gross per 24 hour  Intake 250.09 ml  Output --  Net 250.09 ml   There were no vitals filed for  this visit.  Exam:  General: NAD  Cardiovascular: S1, S2 present Respiratory: CTAB Abdomen: Soft, nontender, nondistended, bowel sounds present Musculoskeletal: 1+ bilateral pedal edema noted Skin: Normal Psychiatry: Normal mood   Data Reviewed: CBC: Recent Labs  Lab 01/13/22 0311 01/15/22 0245  WBC 5.1 5.9  NEUTROABS  --  2.4  HGB 11.2* 10.9*  HCT 37.3 34.4*  MCV 75.8* 76.1*  PLT 295 280    Basic Metabolic Panel: Recent Labs  Lab 01/13/22 0311 01/15/22 0245  NA 142 143  K 3.7 3.2*  CL 104 107  CO2 29 25  GLUCOSE 127* 117*  BUN 10 11  CREATININE 0.92 0.69  CALCIUM 9.3 8.9  MG  --  2.2  PHOS  --  3.7   GFR: Estimated Creatinine Clearance: 67.6 mL/min (by C-G formula based on SCr of 0.69 mg/dL). Liver Function Tests: Recent Labs  Lab 01/13/22 0311 01/15/22 0245  AST 22 21  ALT 16 17  ALKPHOS  --  110  BILITOT 0.5 0.5  PROT 6.8 6.3*  ALBUMIN  --  3.7   No results for input(s): LIPASE, AMYLASE in the last 168 hours. No results for input(s): AMMONIA in the last 168 hours. Coagulation Profile: No results for input(s): INR, PROTIME in the last 168 hours. Cardiac Enzymes: No results for input(s): CKTOTAL, CKMB, CKMBINDEX, TROPONINI in the last 168 hours. BNP (last 3 results) No results for input(s): PROBNP in the last 8760 hours. HbA1C: No results for input(s): HGBA1C in the last 72 hours. CBG: No results for input(s): GLUCAP in the last 168 hours. Lipid Profile: No results for input(s): CHOL, HDL, LDLCALC, TRIG, CHOLHDL, LDLDIRECT in the last 72 hours. Thyroid Function Tests: Recent Labs    01/15/22 0245  TSH 1.137   Anemia Panel: Recent Labs    01/15/22 0245  VITAMINB12 391  FOLATE 6.6  FERRITIN 19  TIBC 340  IRON 47  RETICCTPCT 1.6   Urine analysis: No results found for: COLORURINE, APPEARANCEUR, LABSPEC, PHURINE, GLUCOSEU, HGBUR, BILIRUBINUR, KETONESUR, PROTEINUR, UROBILINOGEN, NITRITE, LEUKOCYTESUR Sepsis Labs: @LABRCNTIP (procalcitonin:4,lacticidven:4)  ) Recent Results (from the past 240 hour(s))  Blood culture (routine single)     Status: None (Preliminary result)   Collection Time: 01/13/22  3:11 AM   Specimen: Blood  Result Value Ref Range Status   MICRO NUMBER: 01/15/22  Preliminary   SPECIMEN QUALITY: Adequate  Preliminary   Source BLOOD 2  Preliminary   STATUS: PRELIMINARY  Preliminary   Result:   Preliminary    No  growth to date. Culture is continuously monitored for a total of 120 hours incubation. A change in status will result in a phone report followed by an updated printed culture report.   COMMENT: Aerobic and anaerobic bottle received.  Preliminary  Blood culture (routine single)     Status: None (Preliminary result)   Collection Time: 01/13/22  3:11 AM   Specimen: Blood  Result Value Ref Range Status   MICRO NUMBER: 01/15/22  Preliminary   SPECIMEN QUALITY: Adequate  Preliminary   Source BLOOD 1  Preliminary   STATUS: PRELIMINARY  Preliminary   Result:   Preliminary    No growth to date. Culture is continuously monitored for a total of 120 hours incubation. A change in status will result in a phone report followed by an updated printed culture report.   COMMENT: Aerobic and anaerobic bottle received.  Preliminary  Resp Panel by RT-PCR (Flu A&B, Covid) Nasopharyngeal Swab     Status: None  Collection Time: 01/14/22 11:00 PM   Specimen: Nasopharyngeal Swab; Nasopharyngeal(NP) swabs in vial transport medium  Result Value Ref Range Status   SARS Coronavirus 2 by RT PCR NEGATIVE NEGATIVE Final    Comment: (NOTE) SARS-CoV-2 target nucleic acids are NOT DETECTED.  The SARS-CoV-2 RNA is generally detectable in upper respiratory specimens during the acute phase of infection. The lowest concentration of SARS-CoV-2 viral copies this assay can detect is 138 copies/mL. A negative result does not preclude SARS-Cov-2 infection and should not be used as the sole basis for treatment or other patient management decisions. A negative result may occur with  improper specimen collection/handling, submission of specimen other than nasopharyngeal swab, presence of viral mutation(s) within the areas targeted by this assay, and inadequate number of viral copies(<138 copies/mL). A negative result must be combined with clinical observations, patient history, and epidemiological information. The expected result  is Negative.  Fact Sheet for Patients:  BloggerCourse.com  Fact Sheet for Healthcare Providers:  SeriousBroker.it  This test is no t yet approved or cleared by the [place] FDA and  has been authorized for detection and/or diagnosis of SARS-CoV-2 by FDA under an Emergency Use Authorization (EUA). This EUA will remain  in effect (meaning this test can be used) for the duration of the COVID-19 declaration under Section 564(b)(1) of the Act, 21 U.S.C.section 360bbb-3(b)(1), unless the authorization is terminated  or revoked sooner.       Influenza A by PCR NEGATIVE NEGATIVE Final   Influenza B by PCR NEGATIVE NEGATIVE Final    Comment: (NOTE) The Xpert Xpress SARS-CoV-2/FLU/RSV plus assay is intended as an aid in the diagnosis of influenza from Nasopharyngeal swab specimens and should not be used as a sole basis for treatment. Nasal washings and aspirates are unacceptable for Xpert Xpress SARS-CoV-2/FLU/RSV testing.  Fact Sheet for Patients: BloggerCourse.com  Fact Sheet for Healthcare Providers: SeriousBroker.it  This test is not yet approved or cleared by the [place] FDA and has been authorized for detection and/or diagnosis of SARS-CoV-2 by FDA under an Emergency Use Authorization (EUA). This EUA will remain in effect (meaning this test can be used) for the duration of the COVID-19 declaration under Section 564(b)(1) of the Act, 21 U.S.C. section 360bbb-3(b)(1), unless the authorization is terminated or revoked.  Performed at [facility], [address] [place], [place] [address]       Studies: ECHOCARDIOGRAM COMPLETE  Result Date: 01/15/2022    ECHOCARDIOGRAM REPORT   Patient Name:   Hayley Padilla Date of Exam: 01/15/2022 Medical Rec #:  [id number]     Height:       62.0 in Accession #:    [id number]    Weight:       206.0 lb Date of Birth:  [birth year]     BSA:          1.936 m Patient Age:    70 years      BP:           115/52 mmHg Patient Gender: F             HR:           72 bpm. Exam Location:  Inpatient Procedure: 2D Echo Indications:    Fever  History:        Patient has no prior history of Echocardiogram examinations.                 Risk Factors:Hypertension and Dyslipidemia.  Sonographer:  Devonne DoughtyStacey Plante Referring Phys: [id number] ANASTASSIA DOUTOVA  Sonographer Comments: Image acquisition challenging due to patient body habitus. IMPRESSIONS  1. Left ventricular ejection fraction, by estimation, is 65 to 70%. The left ventricle has normal function. The left ventricle has no regional wall motion abnormalities. Left ventricular diastolic parameters were normal.  2. Right ventricular systolic function is normal. The right ventricular size is normal. There is normal pulmonary artery systolic pressure.  3. Mild mitral valve regurgitation.  4. The aortic valve is normal in structure. Aortic valve regurgitation is not visualized.  5. The inferior vena cava is normal in size with greater than 50% respiratory variability, suggesting right atrial pressure of 3 mmHg. FINDINGS  Left Ventricle: Left ventricular ejection fraction, by estimation, is 65 to 70%. The left ventricle has normal function. The left ventricle has no regional wall motion abnormalities. The left ventricular internal cavity size was normal in size. There is  no left ventricular hypertrophy. Left ventricular diastolic parameters were normal. Right Ventricle: The right ventricular size is normal. Right vetricular wall thickness was not assessed. Right ventricular systolic function is normal. There is normal pulmonary artery systolic pressure. The tricuspid regurgitant velocity is 1.93 m/s, and with an assumed right atrial pressure of 3 mmHg, the estimated right ventricular systolic pressure is 17.9 mmHg. Left Atrium: Left atrial size was normal in size. Right Atrium: Right atrial size was normal in size.  Pericardium: There is no evidence of pericardial effusion. Mitral Valve: There is mild thickening of the mitral valve leaflet(s). Mild mitral valve regurgitation. Tricuspid Valve: The tricuspid valve is normal in structure. Tricuspid valve regurgitation is trivial. Aortic Valve: The aortic valve is normal in structure. Aortic valve regurgitation is not visualized. Aortic valve mean gradient measures 4.0 mmHg. Aortic valve peak gradient measures 7.8 mmHg. Aortic valve area, by VTI measures 1.72 cm. Pulmonic Valve: The pulmonic valve was grossly normal. Pulmonic valve regurgitation is not visualized. Aorta: The aortic root is normal in size and structure. Venous: The inferior vena cava is normal in size with greater than 50% respiratory variability, suggesting right atrial pressure of 3 mmHg. IAS/Shunts: No atrial level shunt detected by color flow Doppler.  LEFT VENTRICLE PLAX 2D LVIDd:         4.60 cm   Diastology LVIDs:         3.20 cm   LV e' medial:    6.74 cm/s LV PW:         1.00 cm   LV E/e' medial:  8.0 LV IVS:        1.00 cm   LV e' lateral:   9.46 cm/s LVOT diam:     1.90 cm   LV E/e' lateral: 5.7 LV SV:         55 LV SV Index:   29 LVOT Area:     2.84 cm  RIGHT VENTRICLE            IVC RV Basal diam:  2.50 cm    IVC diam: 1.80 cm RV Mid diam:    1.90 cm RV S prime:     8.92 cm/s TAPSE (M-mode): 1.8 cm LEFT ATRIUM             Index        RIGHT ATRIUM           Index LA diam:        3.60 cm 1.86 cm/m   RA Area:     12.80 cm LA Vol (A2C):  51.5 ml 26.60 ml/m  RA Volume:   25.50 ml  13.17 ml/m LA Vol (A4C):   49.4 ml 25.52 ml/m LA Biplane Vol: 51.6 ml 26.65 ml/m  AORTIC VALVE AV Area (Vmax):    1.71 cm AV Area (Vmean):   1.68 cm AV Area (VTI):     1.72 cm AV Vmax:           140.00 cm/s AV Vmean:          96.000 cm/s AV VTI:            0.321 m AV Peak Grad:      7.8 mmHg AV Mean Grad:      4.0 mmHg LVOT Vmax:         84.40 cm/s LVOT Vmean:        57.000 cm/s LVOT VTI:          0.195 m LVOT/AV VTI  ratio: 0.61  AORTA Ao Root diam: 2.60 cm Ao Asc diam:  2.70 cm MITRAL VALVE               TRICUSPID VALVE MV Area (PHT): 3.30 cm    TR Peak grad:   14.9 mmHg MV Decel Time: 230 msec    TR Vmax:        193.00 cm/s MV E velocity: 53.60 cm/s MV A velocity: 87.40 cm/s  SHUNTS MV E/A ratio:  0.61        Systemic VTI:  0.20 m                            Systemic Diam: 1.90 cm Dietrich PatesPaula Ross MD Electronically signed by Dietrich PatesPaula Ross MD Signature Date/Time: 01/15/2022/11:35:00 AM    Final    US EKG SITE RITE  Result Date: 01/15/2022 If Site Rite image not attached, placement could not be confirmed due to current cardiac rhythm.   Scheduled Meds:  bupivacaine(PF)       cloNIDine  0.1 mg Oral BID   fentaNYL       gabapentin  300 mg Oral TID   losartan  100 mg Oral Daily   midazolam       pravastatin  40 mg Oral q1800    Continuous Infusions:  sodium chloride 50 mL/hr at 01/15/22 1241     LOS: 1 day     Briant CedarNkeiruka J Deaire Mcwhirter, MD [facility]  If 7PM-7AM, please contact night-coverage [email or web address] 01/15/2022, 2:07 PM

## 2022-01-15 NOTE — Assessment & Plan Note (Addendum)
Appreciate ID consult, blood cultures pending IR consult Hold off on ABX until sample obtained PICC line ordered Obtain echo to eval for vegitation

## 2022-01-15 NOTE — Progress Notes (Signed)
As per ID note, will follow up on [facility] and disc aspirate before placing PICC.

## 2022-01-15 NOTE — Sedation Documentation (Signed)
Patient is resting comfortably. 

## 2022-01-15 NOTE — Assessment & Plan Note (Signed)
Pain management as needed

## 2022-01-15 NOTE — Evaluation (Signed)
Physical Therapy Evaluation Patient Details Name: Hayley Padilla MRN: [id number] DOB: Aug 07, 1951 Today's Date: 01/15/2022  History of Present Illness  71 y.o. female presents to [facility] on 2/1 with worsening back pain and recently developed numbness and weakness in her right leg. multiple falls recently.  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 PMH includes HTN, HLD, DDD, posterior spinal fusion at the L4-5 level in 2011.  Clinical Impression   Pt presents with LE weakness R>L with + foot drop, impaired mobility, antalgic gait, and decreased activity tolerance vs baseline. Pt to benefit from acute PT to address deficits. Pt ambulated short hallway distance with use of RW, overall requiring min guard to mod physical assist for mobility at this time struggling most with bed mobility. At baseline, pt is independent with mobility. Recommending AIR consult to return to mod I level. PT to progress mobility as tolerated, and will continue to follow acutely.         Recommendations for follow up therapy are one component of a multi-disciplinary discharge planning process, led by the attending physician.  Recommendations may be updated based on patient status, additional functional criteria and insurance authorization.  Follow Up Recommendations Acute inpatient rehab (3hours/day)    Assistance Recommended at Discharge Intermittent Supervision/Assistance  Patient can return home with the following  A lot of help with walking and/or transfers;A lot of help with bathing/dressing/bathroom;Assist for transportation;Assistance with cooking/housework    Equipment Recommendations Rolling walker (2 wheels)  Recommendations for Other Services       Functional Status Assessment Patient has had a recent decline in their functional status and  demonstrates the ability to make significant improvements in function in a reasonable and predictable amount of time.     Precautions / Restrictions Precautions Precautions: Fall;Back Precaution Booklet Issued: No Precaution Comments: For patient comfort Restrictions Weight Bearing Restrictions: No      Mobility  Bed Mobility Overal bed mobility: Needs Assistance Bed Mobility: Rolling, Sidelying to Sit Rolling: Mod assist Sidelying to sit: Mod assist, HOB elevated       General bed mobility comments: Cueing for log roll, assistance to push up from sidelying    Transfers Overall transfer level: Needs assistance Equipment used: Rolling walker (2 wheels) Transfers: Sit to/from Stand Sit to Stand: Mod assist, From elevated surface           General transfer comment: Elevated bed, assistance to power up, leans heavily on walker    Ambulation/Gait Ambulation/Gait assistance: Supervision, Min guard Gait Distance (Feet): 40 Feet Assistive device: Rolling walker (2 wheels) Gait Pattern/deviations: Step-to pattern, Decreased step length - right, Decreased dorsiflexion - right, Knee flexed in stance - right, Knee flexed in stance - left, Shuffle, Trunk flexed, Narrow base of support Gait velocity: decr     General Gait Details: Heavy anteior lean on walker, requires UE support for balance, feet close together, R drop foot. Verbal cueing for upright posture, placement of walker, and increased BOS  Stairs            Wheelchair Mobility    Modified Rankin (Stroke Patients Only)       Balance Overall balance assessment: Needs assistance Sitting-balance support: Bilateral upper extremity supported Sitting balance-Leahy Scale: Fair     Standing balance support: Bilateral upper extremity supported Standing balance-Leahy Scale: Poor Standing balance comment: Pt. requires and seeks UE support during all  standing activities, heavily leans on support surfaces                              Pertinent Vitals/Pain Pain Assessment Pain Assessment: Faces Faces Pain Scale: Hurts a little bit Pain Location: back Pain Descriptors / Indicators: Sore, Discomfort Pain Intervention(s): Limited activity within patient's tolerance, Monitored during session, Repositioned    Home Living Family/patient expects to be discharged to:: Private residence Living Arrangements: Children Available Help at Discharge: Family;Available 24 hours/day (daughter works from home) Type of Home: House Home Access: Level entry     Alternate Level Stairs-Number of Steps: flight Home Layout: Two level;Bed/bath upstairs Home Equipment: Rollator (4 wheels);Shower seat      Prior Function Prior Level of Function : Independent/Modified Independent             Mobility Comments: uses rollator to get around ADLs Comments: claims to be independent with all ADLs     Hand Dominance   Dominant Hand: Right    Extremity/Trunk Assessment   Upper Extremity Assessment Upper Extremity Assessment: Defer to OT evaluation    Lower Extremity Assessment Lower Extremity Assessment: RLE deficits/detail;LLE deficits/detail RLE Deficits / Details: Strength: Hip Flexion 3/5, Hip Abd (sit) 4/5, Hip add (sit) 4/5,  knee Ext 3+/5, knee flexion 3/5, DF 2/5, PF 4/5 LLE Deficits / Details: Strength: Hip Flexion 4/5, Hip Abd (sit) 4/5, Hip add (sit) 4/5,  knee Ext 3+/5, knee flexion 3/5, DF 4/5, PF 4/5    Cervical / Trunk Assessment Cervical / Trunk Assessment: Other exceptions Cervical / Trunk Exceptions: Forward Flexion in standing  Communication   Communication: No difficulties  Cognition Arousal/Alertness: Awake/alert Behavior During Therapy: WFL for tasks assessed/performed Overall Cognitive Status: Within Functional Limits for tasks assessed                                          General Comments General comments (skin integrity, edema, etc.): +1 pitting  edema    Exercises     Assessment/Plan    PT Assessment Patient needs continued PT services  PT Problem List Decreased strength;Decreased coordination;Decreased range of motion;Decreased activity tolerance;Decreased balance;Decreased safety awareness;Decreased mobility;Decreased knowledge of use of DME;Decreased knowledge of precautions       PT Treatment Interventions Functional mobility training;DME instruction;Therapeutic exercise;Gait training;Balance training;Stair training;Neuromuscular re-education;Therapeutic activities;Patient/family education    PT Goals (Current goals can be found in the Care Plan section)  Acute Rehab PT Goals Patient Stated Goal: Return to walking mod I with rollator. PT Goal Formulation: With patient Time For Goal Achievement: 01/29/22 Potential to Achieve Goals: Good    Frequency Min 4X/week     Co-evaluation               AM-PAC PT "6 Clicks" Mobility  Outcome Measure Help needed turning from your back to your side while in a flat bed without using bedrails?: A Little Help needed moving from lying on your back to sitting on the side of a flat bed without using bedrails?: A Little Help needed moving to and from a bed to a chair (including a wheelchair)?: A Lot Help needed standing up from a chair using your arms (e.g., wheelchair or bedside chair)?: A Lot Help needed to walk in hospital room?: A Lot (cueing) Help needed climbing 3-5 steps with a railing? : Total 6 Click  Score: 13    End of Session   Activity Tolerance: Patient limited by fatigue Patient left: in bed;with call bell/phone within reach;with bed alarm set Nurse Communication: Mobility status PT Visit Diagnosis: Other abnormalities of gait and mobility (R26.89);Muscle weakness (generalized) (M62.81);Unsteadiness on feet (R26.81)    Time: 3546-5681 PT Time Calculation (min) (ACUTE ONLY): 28 min   Charges:   PT Evaluation $PT Eval Low Complexity: 1 Low PT  Treatments $Therapeutic Activity: 8-22 mins      Marye Round, PT DPT Acute Rehabilitation Services Pager [phone]  Office [phone]   Bartholomew Ramesh E Christain [place] 01/15/2022, 11:14 AM

## 2022-01-15 NOTE — Progress Notes (Signed)
Pharmacy Antibiotic Note  Hayley Padilla is a 71 y.o. female admitted on 01/14/2022 with discitis/osteo for IR aspirate and start antibiotics. Pharmacy has been consulted for Vancomycin + Meropenem dosing.   Of noting, the patient has anaphylaxis listed to PCN but patient describes as throat swelling. No record of cephalosporin use. IR aspirate done today. SCr 0.69  Plan: - Vancomycin 1500 mg IV x 1 followed by 1g IV every 24 hours (eAUC 466, Vd 0.5, SCr 0.8) - Meropenem 1g IV every 8 hours - Will follow up culture results for narrowing options      Temp (24hrs), Avg:98.8 F (37.1 C), Min:98.2 F (36.8 C), Max:100 F (37.8 C)  Recent Labs  Lab 01/13/22 0311 01/15/22 0245  WBC 5.1 5.9  CREATININE 0.92 0.69    Estimated Creatinine Clearance: 67.6 mL/min (by C-G formula based on SCr of 0.69 mg/dL).    Allergies  Allergen Reactions   Penicillins Anaphylaxis    Did it involve swelling of the face/tongue/throat, SOB, or low BP? Yes Did it involve sudden or severe rash/hives, skin peeling, or any reaction on the inside of your mouth or nose? No Did you need to seek medical attention at a hospital or doctor's office? Yes When did it last happen?      "Been a While"  If all above answers are NO, may proceed with cephalosporin use.    Aspirin Other (See Comments)    brusing _ patient requested to be listed    Antimicrobials this admission: Vancomycin 2/2 >> Meropenem 2/2 >>  Microbiology results: 1/31 BCx >> ngtd 2/2 IR aspirate >>   Thank you for allowing pharmacy to be a part of this patients care.  Georgina Pillion, PharmD, BCPS Infectious Diseases Clinical Pharmacist 01/15/2022 3:33 PM   **Pharmacist phone directory can now be found on [email or web address] ([email or web address]).  Listed under [facility].

## 2022-01-15 NOTE — Progress Notes (Signed)
At bedside for IVT consult. Pt out of the room at this time.

## 2022-01-15 NOTE — Evaluation (Signed)
Occupational Therapy Evaluation Patient Details Name: Hayley Padilla MRN: [id number] DOB: Dec 01, 1951 Today's Date: 01/15/2022   History of Present Illness 71 y.o. female [facility] with worsening back pain and recently developed numbness and weakness in her right leg. multiple falls recently.  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 PMH includes HTN, HLD, DDD, posterior spinal fusion at the L4-5 level in 2011.   Clinical Impression   Pt admitted for concerns listed above. PTA pt reported that she was independent with all ADL's and IADL's. At this time, pt requires min-mod A for all ADL's as well as cuing for spinal precautions. Pt overall demonstrating difficulty maintaining upright posture with mobility, heavily leaning on RW and reaching out for surfaces all around her despite having the RW. Recommending AIR to maximize pt's independence and safety. OT will follow acutely.        Recommendations for follow up therapy are one component of a multi-disciplinary discharge planning process, led by the attending physician.  Recommendations may be updated based on patient status, additional functional criteria and insurance authorization.   Follow Up Recommendations  Acute inpatient rehab (3hours/day)    Assistance Recommended at Discharge Frequent or constant Supervision/Assistance  Patient can return home with the following A little help with walking and/or transfers;A little help with bathing/dressing/bathroom;Assistance with cooking/housework    Functional Status Assessment  Patient has had a recent decline in their functional status and demonstrates the ability to make significant improvements in function in a reasonable and predictable amount of time.  Equipment Recommendations  Other (comment) (TBD)     Recommendations for Other Services Rehab consult     Precautions / Restrictions Precautions Precautions: Fall;Back Precaution Booklet Issued: No Precaution Comments: For patient comfort Restrictions Weight Bearing Restrictions: No      Mobility Bed Mobility Overal bed mobility: Needs Assistance Bed Mobility: Rolling, Sidelying to Sit Rolling: Mod assist Sidelying to sit: Mod assist, HOB elevated       General bed mobility comments: Cueing for log roll, assistance to push up from sidelying    Transfers Overall transfer level: Needs assistance Equipment used: Rolling walker (2 wheels) Transfers: Sit to/from Stand Sit to Stand: Mod assist, From elevated surface           General transfer comment: assistance to power up, leans heavily on walker      Balance Overall balance assessment: Needs assistance Sitting-balance support: Bilateral upper extremity supported Sitting balance-Leahy Scale: Fair     Standing balance support: Bilateral upper extremity supported Standing balance-Leahy Scale: Poor Standing balance comment: Pt. requires and seeks UE support during all standing activities, heavily leans on support surfaces                           ADL either performed or assessed with clinical judgement   ADL Overall ADL's : Needs assistance/impaired Eating/Feeding: Set up;Sitting   Grooming: Min guard;Standing   Upper Body Bathing: Min guard;Sitting   Lower Body Bathing: Minimal assistance;Sitting/lateral leans;Sit to/from stand;Cueing for back precautions   Upper Body Dressing : Min guard;Sitting   Lower Body Dressing: Minimal assistance;Sitting/lateral leans;Sit to/from stand;Adhering to back precautions   Toilet Transfer: Minimal assistance;Ambulation   Toileting- Clothing Manipulation and Hygiene: Minimal assistance;Cueing for back precautions;Sitting/lateral lean;Sit to/from stand       Functional mobility during ADLs:  Minimal  assistance;Rolling walker (2 wheels) General ADL Comments: Limited due to pain and weakness, needin gcuing to follow spinal precautions     Vision Baseline Vision/History: 1 Wears glasses Ability to See in Adequate Light: 0 Adequate Patient Visual Report: No change from baseline Vision Assessment?: No apparent visual deficits     Perception     Praxis      Pertinent Vitals/Pain Pain Assessment Pain Assessment: Faces Faces Pain Scale: Hurts a little bit Pain Location: back Pain Descriptors / Indicators: Sore, Discomfort Pain Intervention(s): Monitored during session     Hand Dominance Right   Extremity/Trunk Assessment Upper Extremity Assessment Upper Extremity Assessment: Overall WFL for tasks assessed   Lower Extremity Assessment Lower Extremity Assessment: Defer to PT evaluation   Cervical / Trunk Assessment Cervical / Trunk Assessment: Other exceptions Cervical / Trunk Exceptions: Forward Flexion in standing   Communication Communication Communication: No difficulties   Cognition Arousal/Alertness: Awake/alert Behavior During Therapy: WFL for tasks assessed/performed Overall Cognitive Status: Within Functional Limits for tasks assessed                                       General Comments  VSS on RA, BLE edematous    Exercises     Shoulder Instructions      Home Living Family/patient expects to be discharged to:: Private residence Living Arrangements: Children Available Help at Discharge: Family;Available 24 hours/day (daughter works from home) Type of Home: House Home Access: Level entry     Home Layout: Two level;Bed/bath upstairs Alternate Level Stairs-Number of Steps: flight Alternate Level Stairs-Rails: Left     Bathroom Toilet: Standard     Home Equipment: Rollator (4 wheels);Shower seat          Prior Functioning/Environment Prior Level of Function : Independent/Modified Independent             Mobility  Comments: uses rollator to get around ADLs Comments: claims to be independent with all ADLs        OT Problem List: Decreased strength;Decreased activity tolerance;Impaired balance (sitting and/or standing);Decreased safety awareness;Decreased knowledge of use of DME or AE;Pain      OT Treatment/Interventions: Self-care/ADL training;Therapeutic exercise;Energy conservation;DME and/or AE instruction;Therapeutic activities;Patient/family education;Balance training    OT Goals(Current goals can be found in the care plan section) Acute Rehab OT Goals Patient Stated Goal: To go home OT Goal Formulation: With patient Time For Goal Achievement: 01/29/22 Potential to Achieve Goals: Good ADL Goals Pt Will Perform Lower Body Bathing: with modified independence;with adaptive equipment;sitting/lateral leans;sit to/from stand Pt Will Perform Lower Body Dressing: with modified independence;sitting/lateral leans;sit to/from stand;with adaptive equipment Pt Will Transfer to Toilet: with modified independence;ambulating Pt Will Perform Toileting - Clothing Manipulation and hygiene: with modified independence;sitting/lateral leans;sit to/from stand;with adaptive equipment Additional ADL Goal #1: Pt will follow 3/3 spinal precautions 100% of the time.  OT Frequency: Min 2X/week    Co-evaluation              AM-PAC OT "6 Clicks" Daily Activity     Outcome Measure Help from another person eating meals?: A Little Help from another person taking care of personal grooming?: A Little Help from another person toileting, which includes using toliet, bedpan, or urinal?: A Lot Help from another person bathing (including washing, rinsing, drying)?: A Lot Help from another person to put on and taking off regular upper body clothing?: A Little Help  from another person to put on and taking off regular lower body clothing?: A Lot 6 Click Score: 15   End of Session Equipment Utilized During Treatment: Gait  belt;Rolling walker (2 wheels) Nurse Communication: Mobility status  Activity Tolerance: Patient tolerated treatment well Patient left: in bed;with call bell/phone within reach  OT Visit Diagnosis: Unsteadiness on feet (R26.81);Other abnormalities of gait and mobility (R26.89);Muscle weakness (generalized) (M62.81)                Time: 1436-1450 OT Time Calculation (min): 14 min Charges:  OT General Charges $OT Visit: 1 Visit OT Evaluation $OT Eval Moderate Complexity: 1 Mod  Gerson Fauth H., OTR/L Acute Rehabilitation  Ashanti Ratti Elane Gordy Goar 01/15/2022, 5:03 PM

## 2022-01-16 ENCOUNTER — Inpatient Hospital Stay: Payer: Self-pay

## 2022-01-16 DIAGNOSIS — E785 Hyperlipidemia, unspecified: Secondary | ICD-10-CM | POA: Diagnosis not present

## 2022-01-16 DIAGNOSIS — G8929 Other chronic pain: Secondary | ICD-10-CM | POA: Diagnosis not present

## 2022-01-16 DIAGNOSIS — M4646 Discitis, unspecified, lumbar region: Secondary | ICD-10-CM | POA: Diagnosis not present

## 2022-01-16 DIAGNOSIS — I10 Essential (primary) hypertension: Secondary | ICD-10-CM | POA: Diagnosis not present

## 2022-01-16 LAB — CBC WITH DIFFERENTIAL/PLATELET
Abs Immature Granulocytes: 0 10*3/uL (ref 0.00–0.07)
Basophils Absolute: 0 10*3/uL (ref 0.0–0.1)
Basophils Relative: 0 %
Eosinophils Absolute: 0.3 10*3/uL (ref 0.0–0.5)
Eosinophils Relative: 5 %
HCT: 32.6 % — ABNORMAL LOW (ref 36.0–46.0)
Hemoglobin: 9.9 g/dL — ABNORMAL LOW (ref 12.0–15.0)
Immature Granulocytes: 0 %
Lymphocytes Relative: 42 %
Lymphs Abs: 2.5 10*3/uL (ref 0.7–4.0)
MCH: 23 pg — ABNORMAL LOW (ref 26.0–34.0)
MCHC: 30.4 g/dL (ref 30.0–36.0)
MCV: 75.6 fL — ABNORMAL LOW (ref 80.0–100.0)
Monocytes Absolute: 0.6 10*3/uL (ref 0.1–1.0)
Monocytes Relative: 10 %
Neutro Abs: 2.5 10*3/uL (ref 1.7–7.7)
Neutrophils Relative %: 43 %
Platelets: 274 10*3/uL (ref 150–400)
RBC: 4.31 MIL/uL (ref 3.87–5.11)
RDW: 15.3 % (ref 11.5–15.5)
WBC: 5.8 10*3/uL (ref 4.0–10.5)
nRBC: 0 % (ref 0.0–0.2)

## 2022-01-16 LAB — MAGNESIUM: Magnesium: 2.1 mg/dL (ref 1.7–2.4)

## 2022-01-16 LAB — BASIC METABOLIC PANEL
Anion gap: 8 (ref 5–15)
BUN: 12 mg/dL (ref 8–23)
CO2: 25 mmol/L (ref 22–32)
Calcium: 8.6 mg/dL — ABNORMAL LOW (ref 8.9–10.3)
Chloride: 106 mmol/L (ref 98–111)
Creatinine, Ser: 0.86 mg/dL (ref 0.44–1.00)
GFR, Estimated: >60 mL/min (ref >60)
Glucose, Bld: 175 mg/dL — ABNORMAL HIGH (ref 70–99)
Potassium: 3.2 mmol/L — ABNORMAL LOW (ref 3.5–5.1)
Sodium: 139 mmol/L (ref 135–145)

## 2022-01-16 MED ORDER — POTASSIUM CHLORIDE CRYS ER 20 MEQ PO TBCR
40.0000 meq | EXTENDED_RELEASE_TABLET | Freq: Two times a day (BID) | ORAL | Status: AC
  Administered 2022-01-16 (×2): 40 meq via ORAL
  Filled 2022-01-16 (×2): qty 2

## 2022-01-16 MED ORDER — ENOXAPARIN SODIUM 40 MG/0.4ML IJ SOSY
40.0000 mg | PREFILLED_SYRINGE | INTRAMUSCULAR | Status: DC
  Administered 2022-01-17 – 2022-01-20 (×4): 40 mg via SUBCUTANEOUS
  Filled 2022-01-16 (×4): qty 0.4

## 2022-01-16 MED ORDER — SENNOSIDES-DOCUSATE SODIUM 8.6-50 MG PO TABS
1.0000 | ORAL_TABLET | Freq: Two times a day (BID) | ORAL | Status: DC
  Administered 2022-01-16 – 2022-01-20 (×7): 1 via ORAL
  Filled 2022-01-16 (×7): qty 1

## 2022-01-16 MED ORDER — POLYETHYLENE GLYCOL 3350 17 G PO PACK
17.0000 g | PACK | Freq: Two times a day (BID) | ORAL | Status: DC
  Administered 2022-01-16 – 2022-01-20 (×5): 17 g via ORAL
  Filled 2022-01-16 (×6): qty 1

## 2022-01-16 NOTE — Plan of Care (Signed)
Pt is alert oriented x 4. Ambulatory with walker, pt c/o pain mainly when and after walking. PRN percocet given with effective results.  IV antibiotics given per order. No distress noted.  Problem: Education: Goal: Knowledge of General Education information will improve Description: Including pain rating scale, medication(s)/side effects and non-pharmacologic comfort measures Outcome: Progressing   Problem: Health Behavior/Discharge Planning: Goal: Ability to manage health-related needs will improve Outcome: Progressing   Problem: Clinical Measurements: Goal: Ability to maintain clinical measurements within normal limits will improve Outcome: Progressing Goal: Will remain free from infection Outcome: Progressing Goal: Diagnostic test results will improve Outcome: Progressing Goal: Respiratory complications will improve Outcome: Progressing Goal: Cardiovascular complication will be avoided Outcome: Progressing   Problem: Activity: Goal: Risk for activity intolerance will decrease Outcome: Progressing   Problem: Nutrition: Goal: Adequate nutrition will be maintained Outcome: Progressing   Problem: Coping: Goal: Level of anxiety will decrease Outcome: Progressing   Problem: Elimination: Goal: Will not experience complications related to bowel motility Outcome: Progressing Goal: Will not experience complications related to urinary retention Outcome: Progressing   Problem: Pain Managment: Goal: General experience of comfort will improve Outcome: Progressing   Problem: Safety: Goal: Ability to remain free from injury will improve Outcome: Progressing   Problem: Skin Integrity: Goal: Risk for impaired skin integrity will decrease Outcome: Progressing

## 2022-01-16 NOTE — Progress Notes (Signed)
Per ID DR Drue Second PICC okay to place 01/17/22.

## 2022-01-16 NOTE — Progress Notes (Signed)
[facility]    Date of Admission:  01/14/2022   Total days of antibiotics 2   ID: Hayley Padilla is a 71 y.o. female with  lumbar osteo-discitis with epidural phlegmon Active Problems:   Obesity (BMI 30-39.9)   Discitis of lumbar region   Essential hypertension   Chronic pain   HLD (hyperlipidemia)   Leg edema, right    Subjective: Afebrile. Not having significant back pain since receiving medicatiosn this morning  ROS:12 point ros is negative  Medications:   cloNIDine  0.1 mg Oral BID   gabapentin  300 mg Oral TID   losartan  100 mg Oral Daily   potassium chloride  40 mEq Oral BID   pravastatin  40 mg Oral q1800   senna-docusate  1 tablet Oral QHS    Objective: Vital signs in last 24 hours: Temp:  [97.8 F (36.6 C)-98.4 F (36.9 C)] 97.8 F (36.6 C) (02/03 1515) Pulse Rate:  [73-88] 77 (02/03 1515) Resp:  [16-18] 16 (02/03 1515) BP: (125-152)/(63-84) 133/69 (02/03 1515) SpO2:  [98 %-100 %] 100 % (02/03 1515)  Physical Exam  Constitutional:  oriented to person, place, and time. appears well-developed and well-nourished. No distress.  HENT: [place]/AT, PERRLA, no scleral icterus Mouth/Throat: Oropharynx is clear and moist. No oropharyngeal exudate.  Cardiovascular: Normal rate, regular rhythm and normal heart sounds. Exam reveals no gallop and no friction rub.  No murmur heard.  Pulmonary/Chest: Effort normal and breath sounds normal. No respiratory distress.  has no wheezes.  Neck = supple, no nuchal rigidity Abdominal: Soft. Bowel sounds are normal.  exhibits no distension. There is no tenderness.  Lymphadenopathy: no cervical adenopathy. No axillary adenopathy Neurological: alert and oriented to person, place, and time.  Skin: Skin is warm and dry. No rash noted. No erythema.  Psychiatric: a normal mood and affect.  behavior is normal.    Lab Results Recent Labs    01/15/22 0245 01/16/22 0211  WBC 5.9 5.8  HGB 10.9* 9.9*  HCT  34.4* 32.6*  NA 143 139  K 3.2* 3.2*  CL 107 106  CO2 25 25  BUN 11 12  CREATININE 0.69 0.86   Liver Panel Recent Labs    01/15/22 0245  PROT 6.3*  ALBUMIN 3.7  AST 21  ALT 17  ALKPHOS 110  BILITOT 0.5   Sedimentation Rate Recent Labs    01/15/22 0559  ESRSEDRATE 12   C-Reactive Protein No results for input(s): CRP in the last 72 hours.  Microbiology: Blood cx 1/31- NGTD Studies/Results: ECHOCARDIOGRAM COMPLETE  Result Date: 01/15/2022    ECHOCARDIOGRAM REPORT   Patient Name:   Hayley Padilla Date of Exam: 01/15/2022 Medical Rec #:  [id number]     Height:       62.0 in Accession #:    [id number]    Weight:       206.0 lb Date of Birth:  Jul 01, 1951    BSA:          1.936 m Patient Age:    70 years      BP:           115/52 mmHg Patient Gender: F             HR:           72 bpm. Exam Location:  Inpatient Procedure: 2D Echo Indications:    Fever  History:        Patient has no prior history  of Echocardiogram examinations.                 Risk Factors:Hypertension and Dyslipidemia.  Sonographer:    Arlyss Gandy Referring Phys: Idamay  Sonographer Comments: Image acquisition challenging due to patient body habitus. IMPRESSIONS  1. Left ventricular ejection fraction, by estimation, is 65 to 70%. The left ventricle has normal function. The left ventricle has no regional wall motion abnormalities. Left ventricular diastolic parameters were normal.  2. Right ventricular systolic function is normal. The right ventricular size is normal. There is normal pulmonary artery systolic pressure.  3. Mild mitral valve regurgitation.  4. The aortic valve is normal in structure. Aortic valve regurgitation is not visualized.  5. The inferior vena cava is normal in size with greater than 50% respiratory variability, suggesting right atrial pressure of 3 mmHg. FINDINGS  Left Ventricle: Left ventricular ejection fraction, by estimation, is 65 to 70%. The left ventricle has normal function. The  left ventricle has no regional wall motion abnormalities. The left ventricular internal cavity size was normal in size. There is  no left ventricular hypertrophy. Left ventricular diastolic parameters were normal. Right Ventricle: The right ventricular size is normal. Right vetricular wall thickness was not assessed. Right ventricular systolic function is normal. There is normal pulmonary artery systolic pressure. The tricuspid regurgitant velocity is 1.93 m/s, and with an assumed right atrial pressure of 3 mmHg, the estimated right ventricular systolic pressure is [id number] mmHg. Left Atrium: Left atrial size was normal in size. Right Atrium: Right atrial size was normal in size. Pericardium: There is no evidence of pericardial effusion. Mitral Valve: There is mild thickening of the mitral valve leaflet(s). Mild mitral valve regurgitation. Tricuspid Valve: The tricuspid valve is normal in structure. Tricuspid valve regurgitation is trivial. Aortic Valve: The aortic valve is normal in structure. Aortic valve regurgitation is not visualized. Aortic valve mean gradient measures 4.0 mmHg. Aortic valve peak gradient measures 7.8 mmHg. Aortic valve area, by VTI measures 1.72 cm. Pulmonic Valve: The pulmonic valve was grossly normal. Pulmonic valve regurgitation is not visualized. Aorta: The aortic root is normal in size and structure. Venous: The inferior vena cava is normal in size with greater than 50% respiratory variability, suggesting right atrial pressure of 3 mmHg. IAS/Shunts: No atrial level shunt detected by color flow Doppler.  LEFT VENTRICLE PLAX 2D LVIDd:         4.60 cm   Diastology LVIDs:         3.20 cm   LV e' medial:    6.74 cm/s LV PW:         1.00 cm   LV E/e' medial:  8.0 LV IVS:        1.00 cm   LV e' lateral:   9.46 cm/s LVOT diam:     1.90 cm   LV E/e' lateral: 5.7 LV SV:         55 LV SV Index:   29 LVOT Area:     2.84 cm  RIGHT VENTRICLE            IVC RV Basal diam:  2.50 cm    IVC diam: 1.80 cm RV  Mid diam:    1.90 cm RV S prime:     8.92 cm/s TAPSE (M-mode): 1.8 cm LEFT ATRIUM             Index        RIGHT ATRIUM  Index LA diam:        3.60 cm 1.86 cm/m   RA Area:     12.80 cm LA Vol (A2C):   51.5 ml 26.60 ml/m  RA Volume:   25.50 ml  13.17 ml/m LA Vol (A4C):   49.4 ml 25.52 ml/m LA Biplane Vol: 51.6 ml 26.65 ml/m  AORTIC VALVE AV Area (Vmax):    1.71 cm AV Area (Vmean):   1.68 cm AV Area (VTI):     1.72 cm AV Vmax:           140.00 cm/s AV Vmean:          96.000 cm/s AV VTI:            0.321 m AV Peak Grad:      7.8 mmHg AV Mean Grad:      4.0 mmHg LVOT Vmax:         84.40 cm/s LVOT Vmean:        57.000 cm/s LVOT VTI:          0.195 m LVOT/AV VTI ratio: 0.61  AORTA Ao Root diam: 2.60 cm Ao Asc diam:  2.70 cm MITRAL VALVE               TRICUSPID VALVE MV Area (PHT): 3.30 cm    TR Peak grad:   14.9 mmHg MV Decel Time: 230 msec    TR Vmax:        193.00 cm/s MV E velocity: 53.60 cm/s MV A velocity: 87.40 cm/s  SHUNTS MV E/A ratio:  0.61        Systemic VTI:  0.20 m                            Systemic Diam: 1.90 cm Dorris Carnes MD Electronically signed by Dorris Carnes MD Signature Date/Time: 01/15/2022/11:35:00 AM    Final    VAS [place] LOWER EXTREMITY VENOUS (DVT)  Result Date: 01/15/2022  Lower Venous DVT Study Patient Name:  KAHLEESI TETTER  Date of Exam:   01/15/2022 Medical Rec #: [id number]      Accession #:    [id number] Date of Birth: 1951-06-26     Patient Gender: F Patient Age:   31 years Exam Location:  [facility] Procedure:      VAS [place] LOWER EXTREMITY VENOUS (DVT) Referring Phys: Nyoka Lint DOUTOVA --------------------------------------------------------------------------------  Indications: Edema.  Limitations: Sensitivity at popliteal fossa and body habitus. Comparison Study: No prior studies. Performing Technologist: Darlin Coco RDMS, RVT  Examination Guidelines: A complete evaluation includes B-mode imaging, spectral Doppler, color Doppler, and power Doppler as needed of all  accessible portions of each vessel. Bilateral testing is considered an integral part of a complete examination. Limited examinations for reoccurring indications may be performed as noted. The reflux portion of the exam is performed with the patient in reverse Trendelenburg.  +---------+---------------+---------+-----------+----------+-------------------+  RIGHT     Compressibility Phasicity Spontaneity Properties Thrombus Aging       +---------+---------------+---------+-----------+----------+-------------------+  CFV       Full            Yes       Yes                                         +---------+---------------+---------+-----------+----------+-------------------+  SFJ       Full                                                                  +---------+---------------+---------+-----------+----------+-------------------+  FV Prox   Full                                                                  +---------+---------------+---------+-----------+----------+-------------------+  FV Mid    Full                                                                  +---------+---------------+---------+-----------+----------+-------------------+  FV Distal Full                                                                  +---------+---------------+---------+-----------+----------+-------------------+  PFV       Full                                                                  +---------+---------------+---------+-----------+----------+-------------------+  POP       Full            Yes       Yes                                         +---------+---------------+---------+-----------+----------+-------------------+  PTV       Full                                                                  +---------+---------------+---------+-----------+----------+-------------------+  PERO                                                       Not well visualized   +---------+---------------+---------+-----------+----------+-------------------+   +----+---------------+---------+-----------+----------+--------------+  LEFT Compressibility Phasicity Spontaneity Properties Thrombus Aging  +----+---------------+---------+-----------+----------+--------------+  CFV  Full            Yes       Yes                                    +----+---------------+---------+-----------+----------+--------------+     Summary: RIGHT: - There is no evidence of deep vein thrombosis in the lower extremity. However, portions of this examination were limited- see technologist comments above.  - No  cystic structure found in the popliteal fossa.  LEFT: - No evidence of common femoral vein obstruction.  *See table(s) above for measurements and observations. Electronically signed by Orlie Pollen on 01/15/2022 at 5:44:13 PM.    Final    [place] EKG SITE RITE  Result Date: 01/16/2022 If Site Rite image not attached, placement could not be confirmed due to current cardiac rhythm.  [place] EKG SITE RITE  Result Date: 01/15/2022 If [facility] image not attached, placement could not be confirmed due to current cardiac rhythm.    Assessment/Plan: 71yo F with lumbar osteo-discitis with epidural phlegmon.Aspirate cx remain negative at 24hrs. Blood cx ngtd at 72hrs.  - continue on empiric regimen of meropenem plus vancomycin - if unable to tolerate vancomycin, would have low threshold to change to daptomycin, if Cr increases - plan for 6 wk of IV therapy - can place picc line tomorrow if blood cx remain negative - dr comer available to questions over the weekend - will have her follow up with dr Megan Salon in 4-6 wk  Chronic back pain = currently on percocet and gabapentin  Therapeutic drug monitoring = will on vancomycin will need to check level to see if at correct AUC range  History of falls = recommend to continue to work with PT/OT. SNF placement  Javaris W[facility]ington [facility]  [facility] [place] [facility] Pager: [phone]  01/16/2022, 3:26 PM

## 2022-01-16 NOTE — Progress Notes (Signed)
Physical Therapy Treatment Patient Details Name: Hayley Padilla MRN: [id number] DOB: 10-Apr-1951 Today's Date: 01/16/2022   History of Present Illness 71 y.o. female presents to [facility] on 2/1 with worsening back pain and recently developed numbness and weakness in her right leg. multiple falls recently.  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 PMH includes HTN, HLD, DDD, posterior spinal fusion at the L4-5 level in 2011.    PT Comments    Steady progression toward goals.  Emphasis on bed mobility/transition to sitting, scooting, sit to stand safety and stability with progression of gait stability using the RW.  Ending with back education.    Recommendations for follow up therapy are one component of a multi-disciplinary discharge planning process, led by the attending physician.  Recommendations may be updated based on patient status, additional functional criteria and insurance authorization.  Follow Up Recommendations  Acute inpatient rehab (3hours/day)     Assistance Recommended at Discharge Intermittent Supervision/Assistance  Patient can return home with the following A lot of help with walking and/or transfers;A lot of help with bathing/dressing/bathroom;Assist for transportation;Assistance with cooking/housework   Equipment Recommendations  Rolling walker (2 wheels)    Recommendations for Other Services       Precautions / Restrictions Precautions Precautions: Fall;Back Precaution Comments: For patient comfort     Mobility  Bed Mobility               General bed mobility comments: up in the chair on arrival    Transfers Overall transfer level: Needs assistance Equipment used: Rolling walker (2 wheels) Transfers: Sit to/from Stand Sit to Stand: Min assist           General transfer comment: cues  for hand placement    Ambulation/Gait Ambulation/Gait assistance: Min assist Gait Distance (Feet): 60 Feet Assistive device: Rolling walker (2 wheels) Gait Pattern/deviations: Step-through pattern Gait velocity: decr Gait velocity interpretation: <1.31 ft/sec, indicative of household ambulator   General Gait Details: generally steady  with heavier use of the RW.  Cues for posture and proximity to the RW.  Pt needed 3-4 standing rests   Stairs             Wheelchair Mobility    Modified Rankin (Stroke Patients Only)       Balance Overall balance assessment: Needs assistance Sitting-balance support: Bilateral upper extremity supported Sitting balance-Leahy Scale: Fair     Standing balance support: Bilateral upper extremity supported Standing balance-Leahy Scale: Poor Standing balance comment: still reliant on the AD and /or external support                            Cognition Arousal/Alertness: Awake/alert Behavior During Therapy: WFL for tasks assessed/performed Overall Cognitive Status: Within Functional Limits for tasks assessed                                          Exercises      General Comments General comments (skin integrity, edema, etc.): pt advised on back care/precautions, including bed mobility, conditions on lifting, and progression of activity post d/c      Pertinent Vitals/Pain Pain Assessment Pain Assessment: Faces Faces Pain Scale: Hurts a little bit Pain Location: back Pain Descriptors / Indicators: Sore,  Discomfort Pain Intervention(s): Monitored during session    Home Living                          Prior Function            PT Goals (current goals can now be found in the care plan section) Acute Rehab PT Goals Patient Stated Goal: Return to walking mod I with rollator. PT Goal Formulation: With patient Time For Goal Achievement: 01/29/22 Potential to Achieve Goals: Good Progress  towards PT goals: Progressing toward goals    Frequency    Min 4X/week      PT Plan Current plan remains appropriate    Co-evaluation              AM-PAC PT "6 Clicks" Mobility   Outcome Measure  Help needed turning from your back to your side while in a flat bed without using bedrails?: A Little Help needed moving from lying on your back to sitting on the side of a flat bed without using bedrails?: A Little Help needed moving to and from a bed to a chair (including a wheelchair)?: A Lot Help needed standing up from a chair using your arms (e.g., wheelchair or bedside chair)?: A Lot Help needed to walk in hospital room?: A Lot Help needed climbing 3-5 steps with a railing? : Total 6 Click Score: 13    End of Session   Activity Tolerance: Patient limited by fatigue Patient left: in chair;with call bell/phone within reach;with chair alarm set Nurse Communication: Mobility status PT Visit Diagnosis: Other abnormalities of gait and mobility (R26.89);Pain;Muscle weakness (generalized) (M62.81) Pain - part of body:  (back)     Time: 7169-6789 PT Time Calculation (min) (ACUTE ONLY): 23 min  Charges:  $Gait Training: 8-22 mins $Therapeutic Activity: 8-22 mins                     01/16/2022  Jacinto Halim., PT Acute Rehabilitation Services [phone]  (pager) [phone]  (office)   Eliseo Gum Chauntae Hults 01/16/2022, 2:08 PM

## 2022-01-16 NOTE — Progress Notes (Addendum)
Inpatient Rehab Admissions Coordinator:  ° °Met with patient at the bedside to discuss CIR goals/expectations.  We discussed 3 hrs/day of therapy with average length of stay x2 weeks.  She is hopeful for CIR admission and asked that I call to speak with her daughter regarding IV abx.  I will start insurance auth request today and follow up with her next week.   ° °Addendum 1502: Spoke to pt's daughter over the phone.  She has concerns about providing IV abx to pt at home once discharged, largely related to her ability to do it, but also related to her availability with work schedule.  Discussed with Liz Paisley, LCSW, who reached out to Pam Chandler with [facility] to discuss.   ° °Caitlin Warren, PT, DPT °Admissions Coordinator °[phone] °01/16/22  °11:05 AM ° °

## 2022-01-16 NOTE — Progress Notes (Signed)
PROGRESS NOTE  Hayley Padilla:[id number] DOB: 12/21/1950 DOA: 01/14/2022 PCP: Fleet Contras, MD  HPI/Recap of past 24 hours: Hayley Padilla is a 71 y.o. female with medical history significant of HTN, HLD, DDD, presented with worsening chronic back pain for the past couple of weeks, more recently developed progressive numbness and weakness in her right leg.  Of note, patient has had posterior spinal fusion at the L4-5 level in 2011 and also has had revision of fusion at the L2-L4 level.  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  Patient has had multiple falls recently and a chronic complaint of right lower extremity pain, swelling and some weakness.  Due to this patient went to see Dr. Bonnita Nasuti of infectious disease.  Patient was advised to go to the ER for admission and further management.    Today, patient denies any new complaints.   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 BC x2 NGTD Status post IR disc aspiration on 01/15/2022 Aerobic/anaerobic culture NGTD Echo showed EF of 65 to 70%, no regional wall motion abnormalities, no vegetations visualized Appreciate ID consult, started on vancomycin plus meropenem for total of 6 weeks of IV antibiotics PICC line will be placed on 01/17/2022 Monitor  closely  Hypokalemia Replace as needed  Essential hypertension Continue clonidine, losartan  HLD (hyperlipidemia) Continue statins  History of knee osteoarthritis/DJD/chronic pain Noted chronic pain, right leg edema worse than left S/p left knee replacement Venous Doppler negative for DVT Pain management  Microcytic anemia Baseline hemoglobin 10-11 Anemia panel WNL Daily CBC   Obesity (BMI 30-39.9) Lifestyle modification advised       Estimated body mass index is 37.68 kg/m as calculated from the following:   Height as of 01/13/22: 5\' 2"  (1.575 m).   Weight as of 01/13/22: 93.4 kg.     Code Status: Full  Family Communication: None at bedside  Disposition Plan: Status is: Inpatient Remains inpatient appropriate because: Level of care     Consultants: IR ID  Procedures: IR disc aspiration on 01/15/2022  Antimicrobials: Vancomycin Meropenem  DVT prophylaxis: Lovenox   Objective: Vitals:   01/16/22 0417 01/16/22 0847 01/16/22 1107 01/16/22 1515  BP: 125/80 126/84 (!) 152/63 133/69  Pulse: 75 80 75 77  Resp: 17 18 18 16   Temp: 98.4 F (36.9 C) 98.1 F (36.7 C) 98.2 F (36.8 C) 97.8 F (36.6 C)  TempSrc: Oral Oral Oral Oral  SpO2: 99% 99% 100% 100%    Intake/Output Summary (Last 24 hours) at 01/16/2022 1822 Last data filed at 01/16/2022 0018 Gross per 24 hour  Intake 480 ml  Output --  Net 480 ml   There were no vitals filed for this visit.  Exam:  General: NAD  Cardiovascular: S1, S2 present Respiratory: CTAB Abdomen: Soft, nontender, nondistended, bowel sounds present Musculoskeletal: 1+ bilateral pedal  edema noted Skin: Normal Psychiatry: Normal mood   Data Reviewed: CBC: Recent Labs  Lab 01/13/22 0311 01/15/22 0245 01/16/22 0211  WBC 5.1 5.9 5.8  NEUTROABS  --  2.4 2.5  HGB 11.2* 10.9* 9.9*  HCT 37.3 34.4* 32.6*  MCV 75.8* 76.1* 75.6*  PLT 295 280 274   Basic Metabolic Panel: Recent Labs  Lab 01/13/22 0311  01/15/22 0245 01/16/22 0211  NA 142 143 139  K 3.7 3.2* 3.2*  CL 104 107 106  CO2 29 25 25   GLUCOSE 127* 117* 175*  BUN 10 11 12   CREATININE 0.92 0.69 0.86  CALCIUM 9.3 8.9 8.6*  MG  --  2.2 2.1  PHOS  --  3.7  --    GFR: Estimated Creatinine Clearance: 62.9 mL/min (by C-G formula based on SCr of 0.86 mg/dL). Liver Function Tests: Recent Labs  Lab 01/13/22 0311 01/15/22 0245  AST 22 21  ALT 16 17  ALKPHOS  --  110  BILITOT 0.5 0.5  PROT 6.8 6.3*  ALBUMIN  --  3.7   No results for input(s): LIPASE, AMYLASE in the last 168 hours. No results for input(s): AMMONIA in the last 168 hours. Coagulation Profile: No results for input(s): INR, PROTIME in the last 168 hours. Cardiac Enzymes: No results for input(s): CKTOTAL, CKMB, CKMBINDEX, TROPONINI in the last 168 hours. BNP (last 3 results) No results for input(s): PROBNP in the last 8760 hours. HbA1C: No results for input(s): HGBA1C in the last 72 hours. CBG: No results for input(s): GLUCAP in the last 168 hours. Lipid Profile: No results for input(s): CHOL, HDL, LDLCALC, TRIG, CHOLHDL, LDLDIRECT in the last 72 hours. Thyroid Function Tests: Recent Labs    01/15/22 0245  TSH 1.137   Anemia Panel: Recent Labs    01/15/22 0245  VITAMINB12 391  FOLATE 6.6  FERRITIN 19  TIBC 340  IRON 47  RETICCTPCT 1.6   Urine analysis: No results found for: COLORURINE, APPEARANCEUR, LABSPEC, PHURINE, GLUCOSEU, HGBUR, BILIRUBINUR, KETONESUR, PROTEINUR, UROBILINOGEN, NITRITE, LEUKOCYTESUR Sepsis Labs: @LABRCNTIP (procalcitonin:4,lacticidven:4)  ) Recent Results (from the past 240 hour(s))  Blood culture (routine single)     Status: None (Preliminary result)   Collection Time: 01/13/22  3:11 AM   Specimen: Blood  Result Value Ref Range Status   MICRO NUMBER: [id number]  Preliminary   SPECIMEN QUALITY: Adequate  Preliminary   Source BLOOD 2  Preliminary   STATUS: PRELIMINARY  Preliminary   Result:   Preliminary    No growth  to date. Culture is continuously monitored for a total of 120 hours incubation. A change in status will result in a phone report followed by an updated printed culture report.   COMMENT: Aerobic and anaerobic bottle received.  Preliminary  Blood culture (routine single)     Status: None (Preliminary result)   Collection Time: 01/13/22  3:11 AM   Specimen: Blood  Result Value Ref Range Status   MICRO NUMBER: [id number]  Preliminary   SPECIMEN QUALITY: Adequate  Preliminary   Source BLOOD 1  Preliminary   STATUS: PRELIMINARY  Preliminary   Result:   Preliminary    No growth to date. Culture is continuously monitored for a total of 120 hours incubation. A change in status will result in a phone report followed by an updated printed culture report.   COMMENT: Aerobic and anaerobic bottle received.  Preliminary  Resp Panel by RT-PCR (Flu A&B, Covid) Nasopharyngeal Swab     Status: None   Collection Time: 01/14/22  11:00 PM   Specimen: Nasopharyngeal Swab; Nasopharyngeal(NP) swabs in vial transport medium  Result Value Ref Range Status   SARS Coronavirus 2 by RT PCR NEGATIVE NEGATIVE Final    Comment: (NOTE) SARS-CoV-2 target nucleic acids are NOT DETECTED.  The SARS-CoV-2 RNA is generally detectable in upper respiratory specimens during the acute phase of infection. The lowest concentration of SARS-CoV-2 viral copies this assay can detect is 138 copies/mL. A negative result does not preclude SARS-Cov-2 infection and should not be used as the sole basis for treatment or other patient management decisions. A negative result may occur with  improper specimen collection/handling, submission of specimen other than nasopharyngeal swab, presence of viral mutation(s) within the areas targeted by this assay, and inadequate number of viral copies(<138 copies/mL). A negative result must be combined with clinical observations, patient history, and epidemiological information. The expected result is  Negative.  Fact Sheet for Patients:  BloggerCourse.com  Fact Sheet for Healthcare Providers:  SeriousBroker.it  This test is no t yet approved or cleared by the [place] FDA and  has been authorized for detection and/or diagnosis of SARS-CoV-2 by FDA under an Emergency Use Authorization (EUA). This EUA will remain  in effect (meaning this test can be used) for the duration of the COVID-19 declaration under Section 564(b)(1) of the Act, 21 U.S.C.section 360bbb-3(b)(1), unless the authorization is terminated  or revoked sooner.       Influenza A by PCR NEGATIVE NEGATIVE Final   Influenza B by PCR NEGATIVE NEGATIVE Final    Comment: (NOTE) The Xpert Xpress SARS-CoV-2/FLU/RSV plus assay is intended as an aid in the diagnosis of influenza from Nasopharyngeal swab specimens and should not be used as a sole basis for treatment. Nasal washings and aspirates are unacceptable for Xpert Xpress SARS-CoV-2/FLU/RSV testing.  Fact Sheet for Patients: BloggerCourse.com  Fact Sheet for Healthcare Providers: SeriousBroker.it  This test is not yet approved or cleared by the [place] FDA and has been authorized for detection and/or diagnosis of SARS-CoV-2 by FDA under an Emergency Use Authorization (EUA). This EUA will remain in effect (meaning this test can be used) for the duration of the COVID-19 declaration under Section 564(b)(1) of the Act, 21 U.S.C. section 360bbb-3(b)(1), unless the authorization is terminated or revoked.  Performed at [facility], [address], [place], [place] [address]   Aerobic/Anaerobic Culture w Gram Stain (surgical/deep wound)     Status: None (Preliminary result)   Collection Time: 01/15/22 11:45 AM   Specimen: PATH Disc; Body Fluid  Result Value Ref Range Status   Specimen Description FLUID  Final   Special Requests L1 INTRAVERTEBRAL DISC   Final   Gram Stain NO WBC SEEN NO ORGANISMS SEEN   Final   Culture   Final    NO GROWTH < 24 HOURS Performed at [facility], [address] [place], [place] [address]    Report Status PENDING  Incomplete      Studies: [place] EKG SITE RITE  Result Date: 01/16/2022 If [facility] image not attached, placement could not be confirmed due to current cardiac rhythm.   Scheduled Meds:  cloNIDine  0.1 mg Oral BID   gabapentin  300 mg Oral TID   losartan  100 mg Oral Daily   polyethylene glycol  17 g Oral BID   potassium chloride  40 mEq Oral BID   pravastatin  40 mg Oral q1800   senna-docusate  1 tablet Oral BID    Continuous  Infusions:  meropenem (MERREM) IV 1 g (01/16/22 1653)   vancomycin 1,000 mg (01/16/22 1817)     LOS: 2 days     Briant CedarNkeiruka J Addie Alonge, MD [facility]  If 7PM-7AM, please contact night-coverage [email or web address] 01/16/2022, 6:22 PM

## 2022-01-17 DIAGNOSIS — E785 Hyperlipidemia, unspecified: Secondary | ICD-10-CM | POA: Diagnosis not present

## 2022-01-17 DIAGNOSIS — I10 Essential (primary) hypertension: Secondary | ICD-10-CM | POA: Diagnosis not present

## 2022-01-17 DIAGNOSIS — G8929 Other chronic pain: Secondary | ICD-10-CM | POA: Diagnosis not present

## 2022-01-17 DIAGNOSIS — M4646 Discitis, unspecified, lumbar region: Secondary | ICD-10-CM | POA: Diagnosis not present

## 2022-01-17 LAB — BASIC METABOLIC PANEL
Anion gap: 8 (ref 5–15)
BUN: 9 mg/dL (ref 8–23)
CO2: 27 mmol/L (ref 22–32)
Calcium: 8.8 mg/dL — ABNORMAL LOW (ref 8.9–10.3)
Chloride: 106 mmol/L (ref 98–111)
Creatinine, Ser: 0.56 mg/dL (ref 0.44–1.00)
GFR, Estimated: >60 mL/min (ref >60)
Glucose, Bld: 126 mg/dL — ABNORMAL HIGH (ref 70–99)
Potassium: 3.7 mmol/L (ref 3.5–5.1)
Sodium: 141 mmol/L (ref 135–145)

## 2022-01-17 LAB — CBC WITH DIFFERENTIAL/PLATELET
Abs Immature Granulocytes: 0 10*3/uL (ref 0.00–0.07)
Basophils Absolute: 0 10*3/uL (ref 0.0–0.1)
Basophils Relative: 0 %
Eosinophils Absolute: 0.3 10*3/uL (ref 0.0–0.5)
Eosinophils Relative: 4 %
HCT: 32.6 % — ABNORMAL LOW (ref 36.0–46.0)
Hemoglobin: 9.9 g/dL — ABNORMAL LOW (ref 12.0–15.0)
Immature Granulocytes: 0 %
Lymphocytes Relative: 46 %
Lymphs Abs: 3 10*3/uL (ref 0.7–4.0)
MCH: 23 pg — ABNORMAL LOW (ref 26.0–34.0)
MCHC: 30.4 g/dL (ref 30.0–36.0)
MCV: 75.8 fL — ABNORMAL LOW (ref 80.0–100.0)
Monocytes Absolute: 0.6 10*3/uL (ref 0.1–1.0)
Monocytes Relative: 10 %
Neutro Abs: 2.5 10*3/uL (ref 1.7–7.7)
Neutrophils Relative %: 40 %
Platelets: 271 10*3/uL (ref 150–400)
RBC: 4.3 MIL/uL (ref 3.87–5.11)
RDW: 15.3 % (ref 11.5–15.5)
WBC: 6.4 10*3/uL (ref 4.0–10.5)
nRBC: 0 % (ref 0.0–0.2)

## 2022-01-17 MED ORDER — CHLORHEXIDINE GLUCONATE CLOTH 2 % EX PADS
6.0000 | MEDICATED_PAD | Freq: Every day | CUTANEOUS | Status: DC
  Administered 2022-01-17 – 2022-01-20 (×4): 6 via TOPICAL

## 2022-01-17 MED ORDER — SODIUM CHLORIDE 0.9% FLUSH
10.0000 mL | Freq: Two times a day (BID) | INTRAVENOUS | Status: DC
  Administered 2022-01-17: 10 mL
  Administered 2022-01-18: 20 mL
  Administered 2022-01-19 – 2022-01-20 (×3): 10 mL

## 2022-01-17 MED ORDER — SODIUM CHLORIDE 0.9% FLUSH: 10.0000 mL | INTRAVENOUS | Status: DC | PRN

## 2022-01-17 MED ORDER — AMLODIPINE BESYLATE 10 MG PO TABS
10.0000 mg | ORAL_TABLET | Freq: Every day | ORAL | Status: DC
  Administered 2022-01-17 – 2022-01-20 (×4): 10 mg via ORAL
  Filled 2022-01-17 (×4): qty 1

## 2022-01-17 NOTE — Progress Notes (Signed)
PT Cancellation Note  Patient Details Name: Hayley Padilla MRN: [id number] DOB: 12/13/1951   Cancelled Treatment:    Reason Eval/Treat Not Completed: (P) Patient at procedure or test/unavailable (PICC line procedure at this time.)   Emmaclaire Switala Artis Delay 01/17/2022, 3:15 PM  Bonney Leitz , PTA Acute Rehabilitation Services Pager [phone] Office [phone]

## 2022-01-17 NOTE — Plan of Care (Signed)
Pt is alert oriented x 4. Ambulatory with walker. Pt is receiving IV antibiotics. PRN percocet for pain and PRN flexeril for pain and muscle spasms. Pt is RA. No distress noted.   Problem: Education: Goal: Knowledge of General Education information will improve Description: Including pain rating scale, medication(s)/side effects and non-pharmacologic comfort measures Outcome: Progressing   Problem: Health Behavior/Discharge Planning: Goal: Ability to manage health-related needs will improve Outcome: Progressing   Problem: Clinical Measurements: Goal: Ability to maintain clinical measurements within normal limits will improve Outcome: Progressing Goal: Will remain free from infection Outcome: Progressing Goal: Diagnostic test results will improve Outcome: Progressing Goal: Respiratory complications will improve Outcome: Progressing Goal: Cardiovascular complication will be avoided Outcome: Progressing   Problem: Activity: Goal: Risk for activity intolerance will decrease Outcome: Progressing   Problem: Nutrition: Goal: Adequate nutrition will be maintained Outcome: Progressing   Problem: Coping: Goal: Level of anxiety will decrease Outcome: Progressing   Problem: Elimination: Goal: Will not experience complications related to bowel motility Outcome: Progressing Goal: Will not experience complications related to urinary retention Outcome: Progressing   Problem: Pain Managment: Goal: General experience of comfort will improve Outcome: Progressing   Problem: Safety: Goal: Ability to remain free from injury will improve Outcome: Progressing   Problem: Skin Integrity: Goal: Risk for impaired skin integrity will decrease Outcome: Progressing

## 2022-01-17 NOTE — Progress Notes (Signed)
Peripherally Inserted Central Catheter Placement  The IV Nurse has discussed with the patient and/or persons authorized to consent for the patient, the purpose of this procedure and the potential benefits and risks involved with this procedure.  The benefits include less needle sticks, lab draws from the catheter, and the patient may be discharged home with the catheter. Risks include, but not limited to, infection, bleeding, blood clot (thrombus formation), and puncture of an artery; nerve damage and irregular heartbeat and possibility to perform a PICC exchange if needed/ordered by physician.  Alternatives to this procedure were also discussed.  Bard Power PICC patient education guide, fact sheet on infection prevention and patient information card has been provided to patient /or left at bedside.    PICC Placement Documentation  PICC Single Lumen 01/17/22 Right Brachial 38 cm 2 cm (Active)  Indication for Insertion or Continuance of Line Home intravenous therapies (PICC only) 01/17/22 1622  Exposed Catheter (cm) 2 cm 01/17/22 1622  Site Assessment Clean;Dry;Intact 01/17/22 1622  Line Status Flushed;Saline locked;Blood return noted 01/17/22 1622  Dressing Type Transparent 01/17/22 1622  Dressing Status Clean;Dry;Intact 01/17/22 1622  Antimicrobial disc in place? Yes 01/17/22 1622  Safety Lock Not Applicable 01/17/22 1622  Line Care Connections checked and tightened 01/17/22 1622  Line Adjustment (NICU/IV Team Only) No 01/17/22 1622  Dressing Intervention New dressing 01/17/22 1622  Dressing Change Due 01/24/22 01/17/22 1622       Elliot Dally 01/17/2022, 4:26 PM

## 2022-01-17 NOTE — Progress Notes (Signed)
PROGRESS NOTE  Hayley Padilla:[id number] DOB: [birth year] DOA: 01/14/2022 PCP: Fleet Contras, MD  HPI/Recap of past 24 hours: Hayley Padilla is a 71 y.o. female with medical history significant of HTN, HLD, DDD, presented with worsening chronic back pain for the past couple of weeks, more recently developed progressive numbness and weakness in her right leg.  Of note, patient has had posterior spinal fusion at the L4-5 level in 2011 and also has had revision of fusion at the L2-L4 level.  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  Patient has had multiple falls recently and a chronic complaint of right lower extremity pain, swelling and some weakness.  Due to this patient went to see Dr. Bonnita Nasuti of infectious disease.  Patient was advised to go to the ER for admission and further management.    Today, patient denies any new complaints.   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 BC x2 NGTD Status post IR disc aspiration on 01/15/2022 Aerobic/anaerobic culture NGTD Echo showed EF of 65 to 70%, no regional wall motion abnormalities, no vegetations visualized Appreciate ID consult, started on vancomycin plus meropenem for total of 6 weeks of IV antibiotics PICC line placed on 01/17/2022 Monitor  closely  Hypokalemia Replace as needed  Essential hypertension Continue clonidine, losartan, amlodipine  HLD (hyperlipidemia) Continue statins  History of knee osteoarthritis/DJD/chronic pain Noted chronic pain, right leg edema worse than left S/p left knee replacement Venous Doppler negative for DVT Pain management  Microcytic anemia Baseline hemoglobin 10-11 Anemia panel WNL Daily CBC   Obesity (BMI 30-39.9) Lifestyle modification advised       Estimated body mass index is 37.68 kg/m as calculated from the following:   Height as of 01/13/22: 5\' 2"  (1.575 m).   Weight as of 01/13/22: 93.4 kg.     Code Status: Full  Family Communication: None at bedside  Disposition Plan: Status is: Inpatient Remains inpatient appropriate because: Level of care     Consultants: IR ID  Procedures: IR disc aspiration on 01/15/2022  Antimicrobials: Vancomycin Meropenem  DVT prophylaxis: Lovenox   Objective: Vitals:   01/17/22 0353 01/17/22 0839 01/17/22 1253 01/17/22 1526  BP: (!) 150/79 (!) 151/79 (!) 171/78 (!) 144/79  Pulse: 73 71 84 93  Resp: 16 16 16 16   Temp: 98.1 F (36.7 C) 98.6 F (37 C) 98.5 F (36.9 C) 98.7 F (37.1 C)  TempSrc: Oral Oral Oral Oral  SpO2: 100% 100% 100% 100%    Intake/Output Summary (Last 24 hours) at 01/17/2022 1827 Last data filed at 01/17/2022 1027 Gross per 24 hour  Intake 240 ml  Output 0 ml  Net 240 ml   There were no vitals filed for this visit.  Exam:  General: NAD  Cardiovascular: S1, S2 present Respiratory: CTAB Abdomen: Soft, nontender, nondistended, bowel sounds present Musculoskeletal:  Trace bilateral pedal edema noted Skin: Normal Psychiatry: Normal mood   Data Reviewed: CBC: Recent Labs  Lab 01/13/22 0311 01/15/22 0245 01/16/22 0211 01/17/22 0259  WBC 5.1 5.9 5.8 6.4  NEUTROABS  --  2.4 2.5 2.5  HGB 11.2* 10.9* 9.9* 9.9*  HCT 37.3 34.4* 32.6* 32.6*  MCV 75.8* 76.1* 75.6* 75.8*  PLT 295 280 274  271   Basic Metabolic Panel: Recent Labs  Lab 01/13/22 0311 01/15/22 0245 01/16/22 0211 01/17/22 0259  NA 142 143 139 141  K 3.7 3.2* 3.2* 3.7  CL 104 107 106 106  CO2 29 25 25 27   GLUCOSE 127* 117* 175* 126*  BUN 10 11 12 9   CREATININE 0.92 0.69 0.86 0.56  CALCIUM 9.3 8.9 8.6* 8.8*  MG  --  2.2 2.1  --   PHOS  --  3.7  --   --    GFR: Estimated Creatinine Clearance: 67.6 mL/min (by C-G formula based on SCr of 0.56 mg/dL). Liver Function Tests: Recent Labs  Lab 01/13/22 0311 01/15/22 0245  AST 22 21  ALT 16 17  ALKPHOS  --  110  BILITOT 0.5 0.5  PROT 6.8 6.3*  ALBUMIN  --  3.7   No results for input(s): LIPASE, AMYLASE in the last 168 hours. No results for input(s): AMMONIA in the last 168 hours. Coagulation Profile: No results for input(s): INR, PROTIME in the last 168 hours. Cardiac Enzymes: No results for input(s): CKTOTAL, CKMB, CKMBINDEX, TROPONINI in the last 168 hours. BNP (last 3 results) No results for input(s): PROBNP in the last 8760 hours. HbA1C: No results for input(s): HGBA1C in the last 72 hours. CBG: No results for input(s): GLUCAP in the last 168 hours. Lipid Profile: No results for input(s): CHOL, HDL, LDLCALC, TRIG, CHOLHDL, LDLDIRECT in the last 72 hours. Thyroid Function Tests: Recent Labs    01/15/22 0245  TSH 1.137   Anemia Panel: Recent Labs    01/15/22 0245  VITAMINB12 391  FOLATE 6.6  FERRITIN 19  TIBC 340  IRON 47  RETICCTPCT 1.6   Urine analysis: No results found for: COLORURINE, APPEARANCEUR, LABSPEC, PHURINE, GLUCOSEU, HGBUR, BILIRUBINUR, KETONESUR, PROTEINUR, UROBILINOGEN, NITRITE, LEUKOCYTESUR Sepsis Labs: @LABRCNTIP (procalcitonin:4,lacticidven:4)  ) Recent Results (from the past 240 hour(s))  Blood culture (routine single)     Status: None (Preliminary result)   Collection Time: 01/13/22  3:11 AM   Specimen: Blood  Result Value Ref Range Status   MICRO NUMBER: [id number]  Preliminary   SPECIMEN QUALITY:  Adequate  Preliminary   Source BLOOD 2  Preliminary   STATUS: PRELIMINARY  Preliminary   Result:   Preliminary    No growth to date. Culture is continuously monitored for a total of 120 hours incubation. A change in status will result in a phone report followed by an updated printed culture report.   COMMENT: Aerobic and anaerobic bottle received.  Preliminary  Blood culture (routine single)     Status: None (Preliminary result)   Collection Time: 01/13/22  3:11 AM   Specimen: Blood  Result Value Ref Range Status   MICRO NUMBER: [id number]  Preliminary   SPECIMEN QUALITY: Adequate  Preliminary   Source BLOOD 1  Preliminary   STATUS: PRELIMINARY  Preliminary   Result:   Preliminary    No growth to date. Culture is continuously monitored for a total of 120 hours incubation. A change in status will result in a phone report followed by an updated printed culture report.   COMMENT: Aerobic  and anaerobic bottle received.  Preliminary  Resp Panel by RT-PCR (Flu A&B, Covid) Nasopharyngeal Swab     Status: None   Collection Time: 01/14/22 11:00 PM   Specimen: Nasopharyngeal Swab; Nasopharyngeal(NP) swabs in vial transport medium  Result Value Ref Range Status   SARS Coronavirus 2 by RT PCR NEGATIVE NEGATIVE Final    Comment: (NOTE) SARS-CoV-2 target nucleic acids are NOT DETECTED.  The SARS-CoV-2 RNA is generally detectable in upper respiratory specimens during the acute phase of infection. The lowest concentration of SARS-CoV-2 viral copies this assay can detect is 138 copies/mL. A negative result does not preclude SARS-Cov-2 infection and should not be used as the sole basis for treatment or other patient management decisions. A negative result may occur with  improper specimen collection/handling, submission of specimen other than nasopharyngeal swab, presence of viral mutation(s) within the areas targeted by this assay, and inadequate number of viral copies(<138 copies/mL). A negative  result must be combined with clinical observations, patient history, and epidemiological information. The expected result is Negative.  Fact Sheet for Patients:  BloggerCourse.com  Fact Sheet for Healthcare Providers:  SeriousBroker.it  This test is no t yet approved or cleared by the [place] FDA and  has been authorized for detection and/or diagnosis of SARS-CoV-2 by FDA under an Emergency Use Authorization (EUA). This EUA will remain  in effect (meaning this test can be used) for the duration of the COVID-19 declaration under Section 564(b)(1) of the Act, 21 U.S.C.section 360bbb-3(b)(1), unless the authorization is terminated  or revoked sooner.       Influenza A by PCR NEGATIVE NEGATIVE Final   Influenza B by PCR NEGATIVE NEGATIVE Final    Comment: (NOTE) The Xpert Xpress SARS-CoV-2/FLU/RSV plus assay is intended as an aid in the diagnosis of influenza from Nasopharyngeal swab specimens and should not be used as a sole basis for treatment. Nasal washings and aspirates are unacceptable for Xpert Xpress SARS-CoV-2/FLU/RSV testing.  Fact Sheet for Patients: BloggerCourse.com  Fact Sheet for Healthcare Providers: SeriousBroker.it  This test is not yet approved or cleared by the [place] FDA and has been authorized for detection and/or diagnosis of SARS-CoV-2 by FDA under an Emergency Use Authorization (EUA). This EUA will remain in effect (meaning this test can be used) for the duration of the COVID-19 declaration under Section 564(b)(1) of the Act, 21 U.S.C. section 360bbb-3(b)(1), unless the authorization is terminated or revoked.  Performed at [facility], [address] [place], [place] [address]   Aerobic/Anaerobic Culture w Gram Stain (surgical/deep wound)     Status: None (Preliminary result)   Collection Time: 01/15/22 11:45 AM   Specimen: PATH  Disc; Body Fluid  Result Value Ref Range Status   Specimen Description FLUID  Final   Special Requests L1 INTRAVERTEBRAL DISC  Final   Gram Stain NO WBC SEEN NO ORGANISMS SEEN   Final   Culture   Final    NO GROWTH 2 DAYS NO ANAEROBES ISOLATED; CULTURE IN PROGRESS FOR 5 DAYS Performed at [facility], [address], [place], [place] [address]    Report Status PENDING  Incomplete      Studies: No results found.  Scheduled Meds:  amLODipine  10 mg Oral Daily   Chlorhexidine Gluconate Cloth  6 each Topical Daily   cloNIDine  0.1 mg Oral BID   enoxaparin (LOVENOX) injection  40 mg Subcutaneous Q24H   gabapentin  300 mg Oral TID   losartan  100  mg Oral Daily   polyethylene glycol  17 g Oral BID   pravastatin  40 mg Oral q1800   senna-docusate  1 tablet Oral BID   sodium chloride flush  10-40 mL Intracatheter Q12H    Continuous Infusions:  meropenem (MERREM) IV 1 g (01/17/22 1722)   vancomycin 1,000 mg (01/16/22 1817)     LOS: 3 days     Briant CedarNkeiruka J Verle Brillhart, MD [facility]  If 7PM-7AM, please contact night-coverage [email or web address] 01/17/2022, 6:27 PM

## 2022-01-18 DIAGNOSIS — E785 Hyperlipidemia, unspecified: Secondary | ICD-10-CM | POA: Diagnosis not present

## 2022-01-18 DIAGNOSIS — I10 Essential (primary) hypertension: Secondary | ICD-10-CM | POA: Diagnosis not present

## 2022-01-18 DIAGNOSIS — M4646 Discitis, unspecified, lumbar region: Secondary | ICD-10-CM | POA: Diagnosis not present

## 2022-01-18 DIAGNOSIS — G8929 Other chronic pain: Secondary | ICD-10-CM | POA: Diagnosis not present

## 2022-01-18 LAB — CBC WITH DIFFERENTIAL/PLATELET
Abs Immature Granulocytes: 0.03 10*3/uL (ref 0.00–0.07)
Basophils Absolute: 0 10*3/uL (ref 0.0–0.1)
Basophils Relative: 0 %
Eosinophils Absolute: 0.2 10*3/uL (ref 0.0–0.5)
Eosinophils Relative: 2 %
HCT: 34.3 % — ABNORMAL LOW (ref 36.0–46.0)
Hemoglobin: 10.6 g/dL — ABNORMAL LOW (ref 12.0–15.0)
Immature Granulocytes: 1 %
Lymphocytes Relative: 35 %
Lymphs Abs: 2.1 10*3/uL (ref 0.7–4.0)
MCH: 23.2 pg — ABNORMAL LOW (ref 26.0–34.0)
MCHC: 30.9 g/dL (ref 30.0–36.0)
MCV: 75.2 fL — ABNORMAL LOW (ref 80.0–100.0)
Monocytes Absolute: 0.6 10*3/uL (ref 0.1–1.0)
Monocytes Relative: 10 %
Neutro Abs: 3.2 10*3/uL (ref 1.7–7.7)
Neutrophils Relative %: 52 %
Platelets: 286 10*3/uL (ref 150–400)
RBC: 4.56 MIL/uL (ref 3.87–5.11)
RDW: 15.1 % (ref 11.5–15.5)
WBC: 6.1 10*3/uL (ref 4.0–10.5)
nRBC: 0 % (ref 0.0–0.2)

## 2022-01-18 LAB — BASIC METABOLIC PANEL
Anion gap: 9 (ref 5–15)
BUN: 9 mg/dL (ref 8–23)
CO2: 29 mmol/L (ref 22–32)
Calcium: 9.2 mg/dL (ref 8.9–10.3)
Chloride: 105 mmol/L (ref 98–111)
Creatinine, Ser: 0.58 mg/dL (ref 0.44–1.00)
GFR, Estimated: >60 mL/min (ref >60)
Glucose, Bld: 121 mg/dL — ABNORMAL HIGH (ref 70–99)
Potassium: 3.3 mmol/L — ABNORMAL LOW (ref 3.5–5.1)
Sodium: 143 mmol/L (ref 135–145)

## 2022-01-18 LAB — MAGNESIUM: Magnesium: 2.2 mg/dL (ref 1.7–2.4)

## 2022-01-18 MED ORDER — POTASSIUM CHLORIDE CRYS ER 20 MEQ PO TBCR
40.0000 meq | EXTENDED_RELEASE_TABLET | Freq: Two times a day (BID) | ORAL | Status: AC
  Administered 2022-01-18 (×2): 40 meq via ORAL
  Filled 2022-01-18 (×2): qty 2

## 2022-01-18 NOTE — Progress Notes (Signed)
Pharmacy Antibiotic Note  Hayley Padilla is a 71 y.o. female admitted on 01/14/2022 with  discitis/osteomyelitis with epidural phlegmon . ID is following and plans for 6 weeks of IV antibiotics. A PICC line was placed on 01/17/22. WBC 6.1, SCr 0.58, afebrile. Pharmacy has been consulted for vancomycin and meropenem dosing.  Of note, the patient has an anaphylactic penicillin allergy listed, but the patient described it as throat swelling and has no record of cephalosporin use. Meropenem is being tolerated without any issues.   Plan: -Continue vancomycin IV 1000 mg Q24H (goal AUC 400-550, eAUC 466, Vd 0.5, SCr 0.8) -Continue meropenem IV 1g Q8H -Monitor clinical status, cultures, renal function, and length of therapy -Obtain vancomycin peak and trough levels as indicated -Follow ID recommendations and plans for de-escalation   Temp (24hrs), Avg:98.4 F (36.9 C), Min:97.6 F (36.4 C), Max:98.8 F (37.1 C)  Recent Labs  Lab 01/13/22 0311 01/15/22 0245 01/16/22 0211 01/17/22 0259 01/18/22 0050  WBC 5.1 5.9 5.8 6.4 6.1  CREATININE 0.92 0.69 0.86 0.56 0.58    Estimated Creatinine Clearance: 67.6 mL/min (by C-G formula based on SCr of 0.58 mg/dL).    Allergies  Allergen Reactions   Penicillins Anaphylaxis    Did it involve swelling of the face/tongue/throat, SOB, or low BP? Yes Did it involve sudden or severe rash/hives, skin peeling, or any reaction on the inside of your mouth or nose? No Did you need to seek medical attention at a hospital or doctor's office? Yes When did it last happen?      "Been a While"  If all above answers are NO, may proceed with cephalosporin use.    Aspirin Other (See Comments)    brusing _ patient requested to be listed    Antimicrobials this admission: Vancomycin 2/2 >> Meropenem 2/2 >>  Dose adjustments this admission: N/A  Microbiology results: 1/31 Bcx: NGTD 2/2 IR Aspirate: NGTD 2/2 Fungus: sent  Thank you for allowing pharmacy to be a  part of this patients care.  Sanda Klein, PharmD, RPh  PGY-2 Pharmacy Resident 01/18/2022 1:18 PM  Please check [email or web address] for unit-specific pharmacy phone numbers.

## 2022-01-18 NOTE — Progress Notes (Signed)
PROGRESS NOTE  Hayley Padilla LGX:[id number] DOB: 03-29-1951 DOA: 01/14/2022 PCP: Fleet Contras, MD  HPI/Recap of past 24 hours: Hayley Padilla is a 71 y.o. female with medical history significant of HTN, HLD, DDD, presented with worsening chronic back pain for the past couple of weeks, more recently developed progressive numbness and weakness in her right leg.  Of note, patient has had posterior spinal fusion at the L4-5 level in 2011 and also has had revision of fusion at the L2-L4 level.  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  Patient has had multiple falls recently and a chronic complaint of right lower extremity pain, swelling and some weakness.  Due to this patient went to see Dr. Bonnita Nasuti of infectious disease.  Patient was advised to go to the ER for admission and further management.    Today, patient denies any new complaints.   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 BC x2 NGTD Status post IR disc aspiration on 01/15/2022 Aerobic/anaerobic culture NGTD Echo showed EF of 65 to 70%, no regional wall motion abnormalities, no vegetations visualized Appreciate ID consult, started on vancomycin plus meropenem for total of 6 weeks of IV antibiotics PICC line placed on 01/17/2022 Monitor  closely  Hypokalemia Replace as needed  Essential hypertension Continue clonidine, losartan, amlodipine  HLD (hyperlipidemia) Continue statins  History of knee osteoarthritis/DJD/chronic pain Noted chronic pain, right leg edema worse than left S/p left knee replacement Venous Doppler negative for DVT Pain management  Microcytic anemia Baseline hemoglobin 10-11 Anemia panel WNL Daily CBC   Obesity (BMI 30-39.9) Lifestyle modification advised       Estimated body mass index is 37.68 kg/m as calculated from the following:   Height as of 01/13/22: 5\' 2"  (1.575 m).   Weight as of 01/13/22: 93.4 kg.     Code Status: Full  Family Communication: None at bedside  Disposition Plan: Status is: Inpatient Remains inpatient appropriate because: Level of care     Consultants: IR ID  Procedures: IR disc aspiration on 01/15/2022  Antimicrobials: Vancomycin Meropenem  DVT prophylaxis: Lovenox   Objective: Vitals:   01/18/22 0012 01/18/22 0406 01/18/22 0736 01/18/22 1137  BP: 134/64 140/89 (!) 144/80 (!) 146/73  Pulse: 80 73 79 77  Resp: 20 18 18 18   Temp: 98.1 F (36.7 C) 98.5 F (36.9 C) 98.6 F (37 C) 97.6 F (36.4 C)  TempSrc: Oral Oral Oral Oral  SpO2: 100% 100% 99% 100%   No intake or output data in the 24 hours ending 01/18/22 1528  There were no vitals filed for this visit.  Exam:  General: NAD  Cardiovascular: S1, S2 present Respiratory: CTAB Abdomen: Soft, nontender, nondistended, bowel sounds present Musculoskeletal: Trace bilateral pedal edema noted Skin: Normal Psychiatry: Normal mood   Data Reviewed: CBC: Recent Labs  Lab 01/13/22 0311 01/15/22  3500 01/16/22 0211 01/17/22 0259 01/18/22 0050  WBC 5.1 5.9 5.8 6.4 6.1  NEUTROABS  --  2.4 2.5 2.5 3.2  HGB 11.2* 10.9* 9.9* 9.9* 10.6*  HCT 37.3 34.4* 32.6* 32.6* 34.3*  MCV 75.8* 76.1* 75.6* 75.8* 75.2*  PLT 295 280 274 271 286   Basic Metabolic Panel: Recent Labs  Lab  01/13/22 0311 01/15/22 0245 01/16/22 0211 01/17/22 0259 01/18/22 0050  NA 142 143 139 141 143  K 3.7 3.2* 3.2* 3.7 3.3*  CL 104 107 106 106 105  CO2 29 25 25 27 29   GLUCOSE 127* 117* 175* 126* 121*  BUN 10 11 12 9 9   CREATININE 0.92 0.69 0.86 0.56 0.58  CALCIUM 9.3 8.9 8.6* 8.8* 9.2  MG  --  2.2 2.1  --  2.2  PHOS  --  3.7  --   --   --    GFR: Estimated Creatinine Clearance: 67.6 mL/min (by C-G formula based on SCr of 0.58 mg/dL). Liver Function Tests: Recent Labs  Lab 01/13/22 0311 01/15/22 0245  AST 22 21  ALT 16 17  ALKPHOS  --  110  BILITOT 0.5 0.5  PROT 6.8 6.3*  ALBUMIN  --  3.7   No results for input(s): LIPASE, AMYLASE in the last 168 hours. No results for input(s): AMMONIA in the last 168 hours. Coagulation Profile: No results for input(s): INR, PROTIME in the last 168 hours. Cardiac Enzymes: No results for input(s): CKTOTAL, CKMB, CKMBINDEX, TROPONINI in the last 168 hours. BNP (last 3 results) No results for input(s): PROBNP in the last 8760 hours. HbA1C: No results for input(s): HGBA1C in the last 72 hours. CBG: No results for input(s): GLUCAP in the last 168 hours. Lipid Profile: No results for input(s): CHOL, HDL, LDLCALC, TRIG, CHOLHDL, LDLDIRECT in the last 72 hours. Thyroid Function Tests: No results for input(s): TSH, T4TOTAL, FREET4, T3FREE, THYROIDAB in the last 72 hours.  Anemia Panel: No results for input(s): VITAMINB12, FOLATE, FERRITIN, TIBC, IRON, RETICCTPCT in the last 72 hours.  Urine analysis: No results found for: COLORURINE, APPEARANCEUR, LABSPEC, PHURINE, GLUCOSEU, HGBUR, BILIRUBINUR, KETONESUR, PROTEINUR, UROBILINOGEN, NITRITE, LEUKOCYTESUR Sepsis Labs: @LABRCNTIP (procalcitonin:4,lacticidven:4)  ) Recent Results (from the past 240 hour(s))  Blood culture (routine single)     Status: None (Preliminary result)   Collection Time: 01/13/22  3:11 AM   Specimen: Blood  Result Value Ref Range Status   MICRO NUMBER: 03/15/22   Preliminary   SPECIMEN QUALITY: Adequate  Preliminary   Source BLOOD 2  Preliminary   STATUS: PRELIMINARY  Preliminary   Result:   Preliminary    No growth to date. Culture is continuously monitored for a total of 120 hours incubation. A change in status will result in a phone report followed by an updated printed culture report.   COMMENT: Aerobic and anaerobic bottle received.  Preliminary  Blood culture (routine single)     Status: None (Preliminary result)   Collection Time: 01/13/22  3:11 AM   Specimen: Blood  Result Value Ref Range Status   MICRO NUMBER: 01/15/22  Preliminary   SPECIMEN QUALITY: Adequate  Preliminary   Source BLOOD 1  Preliminary   STATUS: PRELIMINARY  Preliminary   Result:   Preliminary    No growth to date. Culture is continuously monitored for a total of 120 hours incubation. A change in status will result in a phone report followed by an updated printed culture report.   COMMENT: Aerobic and anaerobic bottle received.  Preliminary  Resp  Panel by RT-PCR (Flu A&B, Covid) Nasopharyngeal Swab     Status: None   Collection Time: 01/14/22 11:00 PM   Specimen: Nasopharyngeal Swab; Nasopharyngeal(NP) swabs in vial transport medium  Result Value Ref Range Status   SARS Coronavirus 2 by RT PCR NEGATIVE NEGATIVE Final    Comment: (NOTE) SARS-CoV-2 target nucleic acids are NOT DETECTED.  The SARS-CoV-2 RNA is generally detectable in upper respiratory specimens during the acute phase of infection. The lowest concentration of SARS-CoV-2 viral copies this assay can detect is 138 copies/mL. A negative result does not preclude SARS-Cov-2 infection and should not be used as the sole basis for treatment or other patient management decisions. A negative result may occur with  improper specimen collection/handling, submission of specimen other than nasopharyngeal swab, presence of viral mutation(s) within the areas targeted by this assay, and inadequate number of  viral copies(<138 copies/mL). A negative result must be combined with clinical observations, patient history, and epidemiological information. The expected result is Negative.  Fact Sheet for Patients:  BloggerCourse.comhttps://www.fda.gov/media/152166/download  Fact Sheet for Healthcare Providers:  SeriousBroker.ithttps://www.fda.gov/media/152162/download  This test is no t yet approved or cleared by the [place] FDA and  has been authorized for detection and/or diagnosis of SARS-CoV-2 by FDA under an Emergency Use Authorization (EUA). This EUA will remain  in effect (meaning this test can be used) for the duration of the COVID-19 declaration under Section 564(b)(1) of the Act, 21 U.S.C.section 360bbb-3(b)(1), unless the authorization is terminated  or revoked sooner.       Influenza A by PCR NEGATIVE NEGATIVE Final   Influenza B by PCR NEGATIVE NEGATIVE Final    Comment: (NOTE) The Xpert Xpress SARS-CoV-2/FLU/RSV plus assay is intended as an aid in the diagnosis of influenza from Nasopharyngeal swab specimens and should not be used as a sole basis for treatment. Nasal washings and aspirates are unacceptable for Xpert Xpress SARS-CoV-2/FLU/RSV testing.  Fact Sheet for Patients: BloggerCourse.comhttps://www.fda.gov/media/152166/download  Fact Sheet for Healthcare Providers: SeriousBroker.ithttps://www.fda.gov/media/152162/download  This test is not yet approved or cleared by the [place] FDA and has been authorized for detection and/or diagnosis of SARS-CoV-2 by FDA under an Emergency Use Authorization (EUA). This EUA will remain in effect (meaning this test can be used) for the duration of the COVID-19 declaration under Section 564(b)(1) of the Act, 21 U.S.C. section 360bbb-3(b)(1), unless the authorization is terminated or revoked.  Performed at [facility], [address] [place], [place] [address]   Aerobic/Anaerobic Culture w Gram Stain (surgical/deep wound)     Status: None (Preliminary result)   Collection  Time: 01/15/22 11:45 AM   Specimen: PATH Disc; Body Fluid  Result Value Ref Range Status   Specimen Description FLUID  Final   Special Requests L1 INTRAVERTEBRAL DISC  Final   Gram Stain NO WBC SEEN NO ORGANISMS SEEN   Final   Culture   Final    NO GROWTH 3 DAYS NO ANAEROBES ISOLATED; CULTURE IN PROGRESS FOR 5 DAYS Performed at [facility], [address] [place], [place] [address]    Report Status PENDING  Incomplete      Studies: No results found.  Scheduled Meds:  amLODipine  10 mg Oral Daily   Chlorhexidine Gluconate Cloth  6 each Topical Daily   cloNIDine  0.1 mg Oral BID   enoxaparin (LOVENOX) injection  40 mg Subcutaneous Q24H   gabapentin  300 mg Oral TID   losartan  100 mg Oral Daily   polyethylene glycol  17 g Oral BID   potassium chloride  40 mEq Oral BID   pravastatin  40 mg Oral q1800   senna-docusate  1 tablet Oral BID   sodium chloride flush  10-40 mL Intracatheter Q12H    Continuous Infusions:  meropenem (MERREM) IV 1 g (01/18/22 0935)   vancomycin 1,000 mg (01/17/22 1830)     LOS: 4 days     Briant Cedar, MD [facility]  If 7PM-7AM, please contact night-coverage [email or web address] 01/18/2022, 3:28 PM

## 2022-01-19 DIAGNOSIS — M4646 Discitis, unspecified, lumbar region: Secondary | ICD-10-CM | POA: Diagnosis not present

## 2022-01-19 LAB — CULTURE, BLOOD (SINGLE)
MICRO NUMBER:: 12944710
MICRO NUMBER:: 12944711
Result:: NO GROWTH
Result:: NO GROWTH
SPECIMEN QUALITY:: ADEQUATE
SPECIMEN QUALITY:: ADEQUATE

## 2022-01-19 LAB — BASIC METABOLIC PANEL
Anion gap: 6 (ref 5–15)
BUN: 12 mg/dL (ref 8–23)
CO2: 29 mmol/L (ref 22–32)
Calcium: 8.8 mg/dL — ABNORMAL LOW (ref 8.9–10.3)
Chloride: 103 mmol/L (ref 98–111)
Creatinine, Ser: 0.64 mg/dL (ref 0.44–1.00)
GFR, Estimated: >60 mL/min (ref >60)
Glucose, Bld: 116 mg/dL — ABNORMAL HIGH (ref 70–99)
Potassium: 4.1 mmol/L (ref 3.5–5.1)
Sodium: 138 mmol/L (ref 135–145)

## 2022-01-19 LAB — CBC WITH DIFFERENTIAL/PLATELET
Abs Immature Granulocytes: 0.01 10*3/uL (ref 0.00–0.07)
Basophils Absolute: 0 10*3/uL (ref 0.0–0.1)
Basophils Relative: 0 %
Eosinophils Absolute: 0.2 10*3/uL (ref 0.0–0.5)
Eosinophils Relative: 4 %
HCT: 33.5 % — ABNORMAL LOW (ref 36.0–46.0)
Hemoglobin: 10 g/dL — ABNORMAL LOW (ref 12.0–15.0)
Immature Granulocytes: 0 %
Lymphocytes Relative: 50 %
Lymphs Abs: 2.9 10*3/uL (ref 0.7–4.0)
MCH: 22.7 pg — ABNORMAL LOW (ref 26.0–34.0)
MCHC: 29.9 g/dL — ABNORMAL LOW (ref 30.0–36.0)
MCV: 76.1 fL — ABNORMAL LOW (ref 80.0–100.0)
Monocytes Absolute: 0.5 10*3/uL (ref 0.1–1.0)
Monocytes Relative: 8 %
Neutro Abs: 2.2 10*3/uL (ref 1.7–7.7)
Neutrophils Relative %: 38 %
Platelets: 274 10*3/uL (ref 150–400)
RBC: 4.4 MIL/uL (ref 3.87–5.11)
RDW: 15.1 % (ref 11.5–15.5)
WBC: 5.8 10*3/uL (ref 4.0–10.5)
nRBC: 0 % (ref 0.0–0.2)

## 2022-01-19 LAB — VANCOMYCIN, PEAK: Vancomycin Pk: 34 ug/mL (ref 30–40)

## 2022-01-19 NOTE — Care Management Important Message (Signed)
Important Message  Patient Details  Name: Hayley Padilla MRN: [id number] Date of Birth: 1951/04/24   Medicare Important Message Given:  Yes     Dorena Bodo 01/19/2022, 2:12 PM

## 2022-01-19 NOTE — Progress Notes (Signed)
PROGRESS NOTE  Hayley Flockllen L Ibe WUJ:[id number] DOB: 12/20/1950 DOA: 01/14/2022 PCP: Fleet ContrasAvbuere, Edwin, MD  HPI/Recap of past 24 hours: Hayley Padilla is a 71 y.o. female with medical history significant of HTN, HLD, DDD, presented with worsening chronic back pain for the past couple of weeks, more recently developed progressive numbness and weakness in her right leg.  Of note, patient has had posterior spinal fusion at the L4-5 level in 2011 and also has had revision of fusion at the L2-L4 level.  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  Patient has had multiple falls recently and a chronic complaint of right lower extremity pain, swelling and some weakness.  Due to this patient went to see Dr. Bonnita Nasutiambell of infectious disease.  Patient was advised to go to the ER for admission and further management.    Today, patient denies any new complaints.  Daughter unable to help follow-up with administration of IV antibiotics once discharged.  Daughter wants patient to go to SNF.   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 BC x2 NGTD Status post IR disc aspiration on 01/15/2022 Aerobic/anaerobic culture NGTD Echo showed EF of 65 to 70%, no regional wall motion abnormalities, no vegetations visualized Appreciate ID  consult, started on vancomycin plus meropenem for total of 6 weeks of IV antibiotics, ID will adjust prior to discharge for easier administration PICC line placed on 01/17/2022 Monitor closely  Hypokalemia Replace as needed  Essential hypertension Continue clonidine, losartan, amlodipine  HLD (hyperlipidemia) Continue statins  History of knee osteoarthritis/DJD/chronic pain Noted chronic pain, right leg edema worse than left S/p left knee replacement Venous Doppler negative for DVT Pain management  Microcytic anemia Baseline hemoglobin 10-11 Anemia panel WNL Daily CBC   Obesity (BMI 30-39.9) Lifestyle modification advised       Estimated body mass index is 37.68 kg/m as calculated from the following:   Height as of 01/13/22: 5\' 2"  (1.575 m).   Weight as of 01/13/22: 93.4 kg.     Code Status: Full  Family Communication: None at bedside  Disposition Plan: Status is: Inpatient Remains inpatient appropriate because: Level of care     Consultants: IR ID  Procedures: IR disc aspiration on 01/15/2022  Antimicrobials: Vancomycin Meropenem  DVT prophylaxis: Lovenox   Objective: Vitals:   01/19/22 0430 01/19/22 0740 01/19/22 1054 01/19/22 1501  BP: (!) 153/91 (!) 161/82 128/73 (!) 148/73  Pulse: 89 81 75 79  Resp: 18 18 18 18   Temp: 98.6 F (37 C) 98.4 F (36.9 C) 98 F (36.7 C) 98 F (36.7 C)  TempSrc: Oral Oral    SpO2: 100% 100% 100% 99%    Intake/Output Summary (Last 24 hours) at 01/19/2022 1505 Last data filed at 01/19/2022 0800 Gross per 24 hour  Intake 240 ml  Output --  Net 240 ml  There were no vitals filed for this visit.  Exam:  General: NAD  Cardiovascular: S1, S2 present Respiratory: CTAB Abdomen: Soft, nontender, nondistended, bowel sounds present Musculoskeletal: Trace bilateral pedal edema noted Skin: Normal Psychiatry: Normal mood   Data Reviewed: CBC: Recent Labs  Lab 01/15/22 0245 01/16/22 0211 01/17/22 0259  01/18/22 0050 01/19/22 0222  WBC 5.9 5.8 6.4 6.1 5.8  NEUTROABS 2.4 2.5 2.5 3.2 2.2  HGB 10.9* 9.9* 9.9* 10.6* 10.0*  HCT 34.4* 32.6* 32.6* 34.3* 33.5*  MCV 76.1* 75.6* 75.8* 75.2* 76.1*  PLT 280 274 271 286 274   Basic Metabolic Panel: Recent Labs  Lab 01/15/22 0245 01/16/22 0211 01/17/22 0259 01/18/22 0050 01/19/22 0222  NA 143 139 141 143 138  K 3.2* 3.2* 3.7 3.3* 4.1  CL 107 106 106 105 103  CO2 25 25 27 29 29   GLUCOSE 117* 175* 126* 121* 116*  BUN 11 12 9 9 12   CREATININE 0.69 0.86 0.56 0.58 0.64  CALCIUM 8.9 8.6* 8.8* 9.2 8.8*  MG 2.2 2.1  --  2.2  --   PHOS 3.7  --   --   --   --    GFR: Estimated Creatinine Clearance: 67.6 mL/min (by C-G formula based on SCr of 0.64 mg/dL). Liver Function Tests: Recent Labs  Lab 01/13/22 0311 01/15/22 0245  AST 22 21  ALT 16 17  ALKPHOS  --  110  BILITOT 0.5 0.5  PROT 6.8 6.3*  ALBUMIN  --  3.7   No results for input(s): LIPASE, AMYLASE in the last 168 hours. No results for input(s): AMMONIA in the last 168 hours. Coagulation Profile: No results for input(s): INR, PROTIME in the last 168 hours. Cardiac Enzymes: No results for input(s): CKTOTAL, CKMB, CKMBINDEX, TROPONINI in the last 168 hours. BNP (last 3 results) No results for input(s): PROBNP in the last 8760 hours. HbA1C: No results for input(s): HGBA1C in the last 72 hours. CBG: No results for input(s): GLUCAP in the last 168 hours. Lipid Profile: No results for input(s): CHOL, HDL, LDLCALC, TRIG, CHOLHDL, LDLDIRECT in the last 72 hours. Thyroid Function Tests: No results for input(s): TSH, T4TOTAL, FREET4, T3FREE, THYROIDAB in the last 72 hours.  Anemia Panel: No results for input(s): VITAMINB12, FOLATE, FERRITIN, TIBC, IRON, RETICCTPCT in the last 72 hours.  Urine analysis: No results found for: COLORURINE, APPEARANCEUR, LABSPEC, PHURINE, GLUCOSEU, HGBUR, BILIRUBINUR, KETONESUR, PROTEINUR, UROBILINOGEN, NITRITE, LEUKOCYTESUR Sepsis  Labs: @LABRCNTIP (procalcitonin:4,lacticidven:4)  ) Recent Results (from the past 240 hour(s))  Blood culture (routine single)     Status: None   Collection Time: 01/13/22  3:11 AM   Specimen: Blood  Result Value Ref Range Status   MICRO NUMBER: 03/15/22  Final   SPECIMEN QUALITY: Adequate  Final   Source BLOOD 2  Final   STATUS: FINAL  Final   Result: No growth after 5 days  Final   COMMENT: Aerobic and anaerobic bottle received.  Final  Blood culture (routine single)     Status: None   Collection Time: 01/13/22  3:11 AM   Specimen: Blood  Result Value Ref Range Status   MICRO NUMBER: 01/15/22  Final   SPECIMEN QUALITY: Adequate  Final   Source BLOOD 1  Final   STATUS: FINAL  Final   Result: No growth after 5 days  Final   COMMENT: Aerobic and anaerobic bottle received.  Final  Resp Panel by RT-PCR (Flu A&B, Covid) Nasopharyngeal Swab     Status: None   Collection Time:  01/14/22 11:00 PM   Specimen: Nasopharyngeal Swab; Nasopharyngeal(NP) swabs in vial transport medium  Result Value Ref Range Status   SARS Coronavirus 2 by RT PCR NEGATIVE NEGATIVE Final    Comment: (NOTE) SARS-CoV-2 target nucleic acids are NOT DETECTED.  The SARS-CoV-2 RNA is generally detectable in upper respiratory specimens during the acute phase of infection. The lowest concentration of SARS-CoV-2 viral copies this assay can detect is 138 copies/mL. A negative result does not preclude SARS-Cov-2 infection and should not be used as the sole basis for treatment or other patient management decisions. A negative result may occur with  improper specimen collection/handling, submission of specimen other than nasopharyngeal swab, presence of viral mutation(s) within the areas targeted by this assay, and inadequate number of viral copies(<138 copies/mL). A negative result must be combined with clinical observations, patient history, and epidemiological information. The expected result is Negative.  Fact  Sheet for Patients:  BloggerCourse.com  Fact Sheet for Healthcare Providers:  SeriousBroker.it  This test is no t yet approved or cleared by the [place] FDA and  has been authorized for detection and/or diagnosis of SARS-CoV-2 by FDA under an Emergency Use Authorization (EUA). This EUA will remain  in effect (meaning this test can be used) for the duration of the COVID-19 declaration under Section 564(b)(1) of the Act, 21 U.S.C.section 360bbb-3(b)(1), unless the authorization is terminated  or revoked sooner.       Influenza A by PCR NEGATIVE NEGATIVE Final   Influenza B by PCR NEGATIVE NEGATIVE Final    Comment: (NOTE) The Xpert Xpress SARS-CoV-2/FLU/RSV plus assay is intended as an aid in the diagnosis of influenza from Nasopharyngeal swab specimens and should not be used as a sole basis for treatment. Nasal washings and aspirates are unacceptable for Xpert Xpress SARS-CoV-2/FLU/RSV testing.  Fact Sheet for Patients: BloggerCourse.com  Fact Sheet for Healthcare Providers: SeriousBroker.it  This test is not yet approved or cleared by the [place] FDA and has been authorized for detection and/or diagnosis of SARS-CoV-2 by FDA under an Emergency Use Authorization (EUA). This EUA will remain in effect (meaning this test can be used) for the duration of the COVID-19 declaration under Section 564(b)(1) of the Act, 21 U.S.C. section 360bbb-3(b)(1), unless the authorization is terminated or revoked.  Performed at [facility], [address] [place], [place] [address]   Aerobic/Anaerobic Culture w Gram Stain (surgical/deep wound)     Status: None (Preliminary result)   Collection Time: 01/15/22 11:45 AM   Specimen: PATH Disc; Body Fluid  Result Value Ref Range Status   Specimen Description FLUID  Final   Special Requests L1 INTRAVERTEBRAL DISC  Final   Gram  Stain NO WBC SEEN NO ORGANISMS SEEN   Final   Culture   Final    NO GROWTH 4 DAYS NO ANAEROBES ISOLATED; CULTURE IN PROGRESS FOR 5 DAYS Performed at [facility], [address], [place], [place] [address]    Report Status PENDING  Incomplete      Studies: No results found.  Scheduled Meds:  amLODipine  10 mg Oral Daily   Chlorhexidine Gluconate Cloth  6 each Topical Daily   cloNIDine  0.1 mg Oral BID   enoxaparin (LOVENOX) injection  40 mg Subcutaneous Q24H   gabapentin  300 mg Oral TID   losartan  100 mg Oral Daily   polyethylene glycol  17 g Oral BID   pravastatin  40 mg Oral q1800   senna-docusate  1 tablet  Oral BID   sodium chloride flush  10-40 mL Intracatheter Q12H    Continuous Infusions:  meropenem (MERREM) IV 1 g (01/19/22 0856)   vancomycin 1,000 mg (01/18/22 1755)     LOS: 5 days     Briant Cedar, MD [facility]  If 7PM-7AM, please contact night-coverage [email or web address] 01/19/2022, 3:05 PM

## 2022-01-19 NOTE — Progress Notes (Signed)
Physical Therapy Treatment Patient Details Name: Hayley Padilla MRN: [id number] DOB: [birth year] Today's Date: 01/19/2022   History of Present Illness 71 y.o. female [facility] with worsening back pain and recently developed numbness and weakness in her right leg. multiple falls recently.  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 PMH includes HTN, HLD, DDD, posterior spinal fusion at the L4-5 level in 2011.    PT Comments    Pt progressing slowly toward goals, limited by slow alleviation of pain in back and radiating down R LE.  Emphasis on transfer and gait safety with the RW and general progression of gait stability with improving posture and proximity to the RW.    Recommendations for follow up therapy are one component of a multi-disciplinary discharge planning process, led by the attending physician.  Recommendations may be updated based on patient status, additional functional criteria and insurance authorization.  Follow Up Recommendations  Skilled nursing-short term rehab (<3 hours/day)     Assistance Recommended at Discharge Intermittent Supervision/Assistance  Patient can return home with the following A little help with walking and/or transfers;A little help with bathing/dressing/bathroom;Assist for transportation;Help with stairs or ramp for entrance;Assistance with cooking/housework   Equipment Recommendations  Rolling walker (2 wheels)    Recommendations for Other Services       Precautions / Restrictions Precautions Precautions: Fall;Back Precaution Comments: For patient comfort     Mobility  Bed Mobility               General bed mobility comments: up in the chair on arrival    Transfers Overall transfer level: Needs assistance Equipment used: Rolling walker (2 wheels) Transfers:  Sit to/from Stand Sit to Stand: Min guard           General transfer comment: used hands appropriately for ascent and controlled descent    Ambulation/Gait Ambulation/Gait assistance: Min guard Gait Distance (Feet): 150 Feet Assistive device: Rolling walker (2 wheels) Gait Pattern/deviations: Step-through pattern Gait velocity: decr Gait velocity interpretation: <1.31 ft/sec, indicative of household ambulator   General Gait Details: slower, low amplitude steps R worse than L with weak df.  heaver use of the RW, flexed poture, all degrading with distance a back pain/fatigue.  cues for posture and proximity to the RW.   Stairs             Wheelchair Mobility    Modified Rankin (Stroke Patients Only)       Balance Overall balance assessment: Needs assistance   Sitting balance-Leahy Scale: Fair     Standing balance support: Single extremity supported, No upper extremity supported Standing balance-Leahy Scale: Poor Standing balance comment: still reliant on the AD and /or external support                            Cognition Arousal/Alertness: Awake/alert Behavior During Therapy: WFL for tasks assessed/performed Overall Cognitive Status: Within Functional Limits for tasks assessed                                          Exercises      General Comments General comments (skin integrity, edema, etc.): reinforced education for back care/ comfort.      Pertinent Vitals/Pain  Pain Assessment Pain Assessment: Faces Faces Pain Scale: Hurts little more Pain Location: back Pain Descriptors / Indicators: Sore, Discomfort Pain Intervention(s): Monitored during session    Home Living                          Prior Function            PT Goals (current goals can now be found in the care plan section) Acute Rehab PT Goals Patient Stated Goal: Return to walking mod I with rollator. PT Goal Formulation: With patient Time  For Goal Achievement: 01/29/22 Potential to Achieve Goals: Good Progress towards PT goals: Progressing toward goals    Frequency    Min 3X/week      PT Plan Discharge plan needs to be updated;Frequency needs to be updated    Co-evaluation              AM-PAC PT "6 Clicks" Mobility   Outcome Measure  Help needed turning from your back to your side while in a flat bed without using bedrails?: A Little Help needed moving from lying on your back to sitting on the side of a flat bed without using bedrails?: A Little Help needed moving to and from a bed to a chair (including a wheelchair)?: A Little Help needed standing up from a chair using your arms (e.g., wheelchair or bedside chair)?: A Little Help needed to walk in hospital room?: A Little Help needed climbing 3-5 steps with a railing? : A Lot 6 Click Score: 17    End of Session   Activity Tolerance: Patient limited by fatigue Patient left: in chair;with call bell/phone within reach;with chair alarm set Nurse Communication: Mobility status PT Visit Diagnosis: Other abnormalities of gait and mobility (R26.89);Pain;Muscle weakness (generalized) (M62.81) Pain - part of body:  (back)     Time: 2836-6294 PT Time Calculation (min) (ACUTE ONLY): 24 min  Charges:  $Gait Training: 8-22 mins $Therapeutic Activity: 8-22 mins                     01/19/2022  Jacinto Halim., PT Acute Rehabilitation Services [phone]  (pager) [phone]  (office)   Eliseo Gum Dana Dorner 01/19/2022, 1:07 PM

## 2022-01-19 NOTE — TOC Initial Note (Signed)
Transition of Care [facility]) - Initial/Assessment Note    Patient Details  Name: Hayley Padilla MRN: [id number] Date of Birth: 1951/05/19  [facility] CM/SW Contact:    Kermit Balo, RN Phone Number: 01/19/2022, 12:20 PM  Clinical Narrative:                 Patient is from home with family. She requires 6 weeks of IV antibiotics. Per nursing staff she is not able to administer the medications independently and her family is not available to assist. Daughter interested in SNF for IV antibiotics administration and rehab. Cm has faxed her out in the [facility] area per daughter request.  TOC following.  Expected Discharge Plan: Skilled Nursing Facility Barriers to Discharge: Continued Medical Work up   Patient Goals and CMS Choice   CMS Medicare.gov Compare Post Acute Care list provided to:: Patient Choice offered to / list presented to : Patient, Adult Children  Expected Discharge Plan and Services Expected Discharge Plan: Skilled Nursing Facility In-house Referral: Clinical Social Work Discharge Planning Services: CM Consult Post Acute Care Choice: Skilled Nursing Facility Living arrangements for the past 2 months: Single Family Home                                      Prior Living Arrangements/Services Living arrangements for the past 2 months: Single Family Home Lives with:: Adult Children Patient language and need for interpreter reviewed:: Yes Do you feel safe going back to the place where you live?: Yes      Need for Family Participation in Patient Care: Yes (Comment) Care giver support system in place?: No (comment)   Criminal Activity/Legal Involvement Pertinent to Current Situation/Hospitalization: No - Comment as needed  Activities of Daily Living Home Assistive Devices/Equipment: None ADL Screening (condition at time of admission) Patient's cognitive ability adequate to safely complete daily activities?: Yes Is the patient deaf or have  difficulty hearing?: No Does the patient have difficulty seeing, even when wearing glasses/contacts?: No Does the patient have difficulty concentrating, remembering, or making decisions?: No Patient able to express need for assistance with ADLs?: Yes Does the patient have difficulty dressing or bathing?: Yes Independently performs ADLs?: No Communication: Independent Dressing (OT): Independent Grooming: Independent Feeding: Independent Bathing: Independent, Needs assistance Toileting: Needs assistance In/Out Bed: Needs assistance Is this a change from baseline?: Change from baseline, expected to last >3 days Walks in Home: Needs assistance Is this a change from baseline?: Change from baseline, expected to last >3 days Does the patient have difficulty walking or climbing stairs?: Yes Weakness of Legs: Both Weakness of Arms/Hands: Both  Permission Sought/Granted                  Emotional Assessment       Orientation: : Oriented to Self, Oriented to Place, Oriented to  Time, Oriented to Situation   Psych Involvement: No (comment)  Admission diagnosis:  Discitis [M46.40] Patient Active Problem List   Diagnosis Date Noted   Leg edema, right 01/15/2022   Essential hypertension 01/14/2022   Chronic pain 01/14/2022   HLD (hyperlipidemia) 01/14/2022   Discitis 01/14/2022   Obesity (BMI 30-39.9) 01/13/2022   S/P lumbar spinal fusion 01/13/2022   Discitis of lumbar region 01/13/2022   Kyphosis 01/13/2022   Degenerative spondylolisthesis 06/12/2019   PCP:  Fleet Contras, MD Pharmacy:   [facility] [phone] Gin[place] Otto, [place] -  [address] AT [facility] [address] [place] [address] Phone: [phone] Fax: [phone]     Social Determinants of Health (SDOH) Interventions    Readmission Risk Interventions No flowsheet data found.

## 2022-01-19 NOTE — Progress Notes (Signed)
Inpatient Rehab Admissions Coordinator:   Received call from [facility] requesting a peer to peer; MD updated.  I also spoke to patient's daughter regarding IV abx.  She has spoken to Jeri Modena with [facility] and says if she has to do it, she will, but would prefer pt to be able to go to a place where she could complete her course of abx before coming home.  I will continue to follow.   Estill Dooms, PT, DPT Admissions Coordinator [phone] 01/19/22  12:06 PM

## 2022-01-19 NOTE — Progress Notes (Signed)
°    [facility] for Infectious Disease    Date of Admission:  01/14/2022   Total days of antibiotics 5 of vanco/mero           ID: Hayley Padilla is a 71 y.o. female with  lumbar discitis with epidural phlegmon Active Problems:   Obesity (BMI 30-39.9)   Discitis of lumbar region   Essential hypertension   Chronic pain   HLD (hyperlipidemia)   Leg edema, right    Subjective: Afebrile. No difficulty with picc line. Her back pain is at her baseline  12 point ros is otherwise negative  Medications:   amLODipine  10 mg Oral Daily   Chlorhexidine Gluconate Cloth  6 each Topical Daily   cloNIDine  0.1 mg Oral BID   enoxaparin (LOVENOX) injection  40 mg Subcutaneous Q24H   gabapentin  300 mg Oral TID   losartan  100 mg Oral Daily   polyethylene glycol  17 g Oral BID   pravastatin  40 mg Oral q1800   senna-docusate  1 tablet Oral BID   sodium chloride flush  10-40 mL Intracatheter Q12H    Objective: Vital signs in last 24 hours: Temp:  [97.6 F (36.4 C)-98.7 F (37.1 C)] 98 F (36.7 C) (02/06 1054) Pulse Rate:  [72-89] 75 (02/06 1054) Resp:  [18] 18 (02/06 1054) BP: (128-161)/(71-91) 128/73 (02/06 1054) SpO2:  [100 %] 100 % (02/06 1054) Physical Exam  Constitutional:  oriented to person, place, and time. appears well-developed and well-nourished. No distress.  HENT: [place]/AT, PERRLA, no scleral icterus Mouth/Throat: Oropharynx is clear and moist. No oropharyngeal exudate.  Cardiovascular: Normal rate, regular rhythm and normal heart sounds. Exam reveals no gallop and no friction rub.  No murmur heard.  Pulmonary/Chest: Effort normal and breath sounds normal. No respiratory distress.  has no wheezes.  Neck = supple, no nuchal rigidity [id number] arm picc line is c/d/i Lymphadenopathy: no cervical adenopathy. No axillary adenopathy Neurological: alert and oriented to person, place, and time.  Skin: Skin is warm and dry. No rash noted. No erythema.  Psychiatric: a normal  mood and affect.  behavior is normal.    Lab Results Recent Labs    01/18/22 0050 01/19/22 0222  WBC 6.1 5.8  HGB 10.6* 10.0*  HCT 34.3* 33.5*  NA 143 138  K 3.3* 4.1  CL 105 103  CO2 29 29  BUN 9 12  CREATININE 0.58 0.64   Liver Panel No results for input(s): PROT, ALBUMIN, AST, ALT, ALKPHOS, BILITOT, BILIDIR, IBILI in the last 72 hours. Sedimentation Rate No results for input(s): ESRSEDRATE in the last 72 hours. C-Reactive Protein No results for input(s): CRP in the last 72 hours.  Microbiology:  Studies/Results: No results found.   Assessment/Plan: Culture negative lumbar discitis/epidural phlegmon = currently on vancomycin and meropenem. Optimally would like to switch her to daptomycin, less nephrotoxic risk and ertapenem (instead of meropenem) so that she can be daily dosing with each antibiotic. Cost maybe an issue depending on her placement. Please let us know when she is accepted to cir vs snf so that we may finalize abtx recs. Plan for 6 wks.  [facility] Pager: [phone]  01/19/2022, 12:08 PM

## 2022-01-19 NOTE — [place]2 (Signed)
[place] MEDICAID FL2 LEVEL OF CARE SCREENING TOOL     IDENTIFICATION  Patient Name: Hayley Padilla Birthdate: [birth year] Sex: female Admission Date (Current Location): 01/14/2022  [facility] and [place] Number:  Producer, television/film/video and Address:  [facility], [address] [place], [place] [address]      Provider Number: [id number]  Attending Physician Name and Address:  Briant Cedar, MD  Relative Name and Phone Number:       Current Level of Care: Hospital Recommended Level of Care: Skilled Nursing Facility Prior Approval Number:    Date Approved/Denied:   PASRR Number: [id number]  Discharge Plan: SNF    Current Diagnoses: Patient Active Problem List   Diagnosis Date Noted   Leg edema, right 01/15/2022   Essential hypertension 01/14/2022   Chronic pain 01/14/2022   HLD (hyperlipidemia) 01/14/2022   Discitis 01/14/2022   Obesity (BMI 30-39.9) 01/13/2022   S/P lumbar spinal fusion 01/13/2022   Discitis of lumbar region 01/13/2022   Kyphosis 01/13/2022   Degenerative spondylolisthesis 06/12/2019    Orientation RESPIRATION BLADDER Height & Weight     Self, Time, Situation, Place  Normal Continent Weight:   Height:     BEHAVIORAL SYMPTOMS/MOOD NEUROLOGICAL BOWEL NUTRITION STATUS      Continent Diet (heart healthy with thin liquids)  AMBULATORY STATUS COMMUNICATION OF NEEDS Skin   Limited Assist Verbally Normal                       Personal Care Assistance Level of Assistance  Bathing, Feeding, Dressing Bathing Assistance: Limited assistance Feeding assistance: Independent Dressing Assistance: Limited assistance     Functional Limitations Info  Sight, Hearing, Speech Sight Info: Adequate Hearing Info: Adequate Speech Info: Adequate    SPECIAL CARE FACTORS FREQUENCY  PT (By licensed PT), OT (By licensed OT)     PT Frequency: 5x/wk OT Frequency: 5x/wk            Contractures Contractures Info: Not present     Additional Factors Info  Code Status, Allergies, Psychotropic Code Status Info: Full Allergies Info: Penicillin/ Aspirin Psychotropic Info: Neurontin 300 mg TID         Current Medications (01/19/2022):  This is the current hospital active medication list Current Facility-Administered Medications  Medication Dose Route Frequency Provider Last Rate Last Admin   acetaminophen (TYLENOL) tablet 650 mg  650 mg Oral Q6H PRN Doutova, Anastassia, MD       Or   acetaminophen (TYLENOL) suppository 650 mg  650 mg Rectal Q6H PRN Doutova, Anastassia, MD       amLODipine (NORVASC) tablet 10 mg  10 mg Oral Daily Briant Cedar, MD   10 mg at 01/19/22 1100   Chlorhexidine Gluconate Cloth 2 % PADS 6 each  6 each Topical Daily Briant Cedar, MD   6 each at 01/19/22 1100   cloNIDine (CATAPRES) tablet 0.1 mg  0.1 mg Oral BID Doutova, Anastassia, MD   0.1 mg at 01/19/22 1059   cyclobenzaprine (FLEXERIL) tablet 10 mg  10 mg Oral TID PRN Briant Cedar, MD   10 mg at 01/16/22 2102   enoxaparin (LOVENOX) injection 40 mg  40 mg Subcutaneous Q24H Briant Cedar, MD   40 mg at 01/19/22 1100   gabapentin (NEURONTIN) capsule 300 mg  300 mg Oral TID Therisa Doyne, MD   300 mg at 01/19/22 1100   losartan (COZAAR) tablet 100 mg  100 mg Oral  Daily Therisa Doyne, MD   100 mg at 01/19/22 1059   meropenem (MERREM) 1 g in sodium chloride 0.9 % 100 mL IVPB  1 g Intravenous Q8H Ann Held, RPH 200 mL/hr at 01/19/22 0856 1 g at 01/19/22 0856   oxyCODONE-acetaminophen (PERCOCET) 7.5-325 MG per tablet 1 tablet  1 tablet Oral Q4H PRN Therisa Doyne, MD   1 tablet at 01/19/22 0600   polyethylene glycol (MIRALAX / GLYCOLAX) packet 17 g  17 g Oral BID Briant Cedar, MD   17 g at 01/19/22 1100   pravastatin (PRAVACHOL) tablet 40 mg  40 mg Oral q1800 Therisa Doyne, MD   40 mg at 01/18/22 1712   senna-docusate (Senokot-S) tablet 1 tablet  1 tablet Oral BID Briant Cedar, MD   1 tablet at 01/19/22 1059   sodium chloride flush (NS) 0.9 % injection 10-40 mL  10-40 mL Intracatheter Q12H Briant Cedar, MD   10 mL at 01/19/22 1100   sodium chloride flush (NS) 0.9 % injection 10-40 mL  10-40 mL Intracatheter PRN Briant Cedar, MD       vancomycin (VANCOCIN) IVPB 1000 mg/200 mL premix  1,000 mg Intravenous Q24H Ann Held, RPH 200 mL/hr at 01/18/22 1755 1,000 mg at 01/18/22 1755     Discharge Medications: Please see discharge summary for a list of discharge medications.  Relevant Imaging Results:  Relevant Lab Results:   Additional Information SS#: [id number]---[id number] 1000 mg IV daily/ Merrem 1 gm IV every 8 hours  Kermit Balo, RN

## 2022-01-19 NOTE — Plan of Care (Signed)

## 2022-01-20 LAB — RESP PANEL BY RT-PCR (FLU A&B, COVID) ARPGX2
Influenza A by PCR: NEGATIVE
Influenza B by PCR: NEGATIVE
SARS Coronavirus 2 by RT PCR: NEGATIVE

## 2022-01-20 LAB — AEROBIC/ANAEROBIC CULTURE W GRAM STAIN (SURGICAL/DEEP WOUND)
Culture: NO GROWTH
Gram Stain: NONE SEEN

## 2022-01-20 LAB — CK: Total CK: 223 U/L (ref 38–234)

## 2022-01-20 MED ORDER — OXYCODONE-ACETAMINOPHEN 7.5-325 MG PO TABS: 1.0000 | ORAL_TABLET | Freq: Four times a day (QID) | ORAL | 0 refills | Status: DC | PRN

## 2022-01-20 MED ORDER — SODIUM CHLORIDE 0.9 % IV SOLN
8.0000 mg/kg | Freq: Every day | INTRAVENOUS | Status: DC
  Administered 2022-01-20: 550 mg via INTRAVENOUS
  Filled 2022-01-20: qty 11

## 2022-01-20 MED ORDER — SENNOSIDES-DOCUSATE SODIUM 8.6-50 MG PO TABS: 1.0000 | ORAL_TABLET | Freq: Every day | ORAL | Status: AC

## 2022-01-20 MED ORDER — ERTAPENEM IV (FOR PTA / DISCHARGE USE ONLY): 1.0000 g | INTRAVENOUS | 0 refills | Status: DC

## 2022-01-20 MED ORDER — LOVASTATIN 40 MG PO TABS: 40.0000 mg | ORAL_TABLET | Freq: Every evening | ORAL | 0 refills | Status: DC

## 2022-01-20 MED ORDER — POLYETHYLENE GLYCOL 3350 17 G PO PACK: 17.0000 g | PACK | Freq: Every day | ORAL | 0 refills | Status: DC

## 2022-01-20 MED ORDER — SODIUM CHLORIDE 0.9 % IV SOLN
1.0000 g | INTRAVENOUS | Status: DC
  Administered 2022-01-20: 1000 mg via INTRAVENOUS
  Filled 2022-01-20: qty 1

## 2022-01-20 MED ORDER — DAPTOMYCIN IV (FOR PTA / DISCHARGE USE ONLY): 550.0000 mg | INTRAVENOUS | 0 refills | Status: DC

## 2022-01-20 MED ORDER — DEXTROSE 50 % IV SOLN: 12.5000 g | INTRAVENOUS | Status: DC

## 2022-01-20 NOTE — Discharge Summary (Signed)
Physician Discharge Summary   Patient: Hayley Padilla MRN: [id number] DOB: 04-07-1951  Admit date:     01/14/2022  Discharge date: 01/20/22  Discharge Physician: Alma Friendly   PCP: Nolene Ebbs, MD   Recommendations at discharge:   Follow up with PCP in 1 week after Rehab Follow up with ID in 4 weeks  Discharge Diagnoses: Active Problems:   Obesity (BMI 30-39.9)   Discitis of lumbar region   Essential hypertension   Chronic pain   HLD (hyperlipidemia)   Leg edema, right     Hospital Course: Hayley Padilla is a 71 y.o. female with medical history significant of HTN, HLD, DDD, presented with worsening chronic back pain for the past couple of weeks, more recently developed progressive numbness and weakness in her right leg.  Of note, patient has had posterior spinal fusion at the L4-5 level in 2011 and also has had revision of fusion at the L2-L4 level.  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  Patient has had multiple falls recently and a chronic complaint of right lower extremity pain, swelling and some weakness.  Due to this patient went to see Dr. Bridget Hartshorn of infectious disease.  Patient was advised to go to the ER for admission and further management.    Today, pt denies any new complaints.  Stable to DC to SNF for further rehab needs and IV antibiotic administration.  Follow-up with PCP and ID.    Assessment and Plan:  Discitis-osteomyelitis of lumbar region History of DDD Currently afebrile, with no leukocytosis MRI that showed Discitis-osteomyelitis at L1-2, with 1.6 x 1 x 2.3 cm heterogeneous signal along the left ventral epidural space concerning for a phlegmon with small areas of non enhancement likely reflecting a  developing abscess. BC x2 NGTD Status post IR disc aspiration on 01/15/2022 Aerobic/anaerobic culture NGTD Echo showed EF of 65 to 70%, no regional wall motion abnormalities, no vegetations visualized Appreciate ID consult, started on vancomycin plus meropenem, switched IV ertapenem and daptomycin for easier administration complete 6 weeks of IV antibiotics on 02/26/2022 PICC line was placed on 01/17/2022 Follow-up with infectious disease in 4 weeks   Hypokalemia Continue daily potassium supplement   Essential hypertension Continue clonidine, losartan, amlodipine   HLD (hyperlipidemia) Hold statins until IV daptomycin is completed on 02/26/22   History of knee osteoarthritis/DJD/chronic pain Noted chronic pain, right leg edema worse than left S/p left knee replacement Venous Doppler negative for DVT Pain management   Microcytic anemia Baseline hemoglobin 10-11 Anemia panel WNL Daily CBC   Obesity (BMI 30-39.9) Lifestyle modification advised         Consultants: IR, ID Procedures performed: IR disc aspiration on 01/15/2019 Disposition: Skilled nursing facility Diet recommendation:  Cardiac diet  DISCHARGE MEDICATION: Allergies as of 01/20/2022       Reactions   Penicillins Anaphylaxis   Did it involve swelling of the face/tongue/throat, SOB, or low BP? Yes Did it involve sudden or severe rash/hives, skin peeling, or any reaction on the inside of your mouth or nose? No Did you need to seek medical attention at a hospital or doctor's office? Yes When did it last happen?      "Been a While"  If all above answers are NO, may proceed with  cephalosporin use.   Aspirin Other (See Comments)   brusing _ patient requested to be listed        Medication List     STOP taking these medications    diclofenac 75 MG EC tablet Commonly known as: VOLTAREN       TAKE these medications    amLODipine 10 MG tablet Commonly known as: NORVASC Take 10 mg by mouth daily.    cloNIDine 0.1 MG tablet Commonly known as: CATAPRES Take 0.1 mg by mouth 2 (two) times daily.   cyclobenzaprine 10 MG tablet Commonly known as: FLEXERIL Take 10 mg by mouth 3 (three) times daily as needed for muscle spasms.   daptomycin  IVPB Commonly known as: CUBICIN Inject 550 mg into the vein daily. Indication:  Discitis/Osteo First Dose: Yes Last Day of Therapy:  02/26/22 Labs - Once weekly:  CBC/D, BMP, and CPK Labs - Every other week:  ESR and CRP Method of administration: IV Push Pull PICC at the end of therapy Method of administration may be changed at the discretion of home infusion pharmacist based upon assessment of the patient and/or caregiver's ability to self-administer the medication ordered.   ertapenem  IVPB Commonly known as: INVANZ Inject 1 g into the vein daily. Indication:  Discitis/osteo First Dose: Yes Last Day of Therapy:  02/26/22 Labs - Once weekly:  CBC/D and BMP, Labs - Every other week:  ESR and CRP Method of administration: Mini-Bag Plus / Gravity Pull PICC at the end of IV therapy Method of administration may be changed at the discretion of home infusion pharmacist based upon assessment of the patient and/or caregiver's ability to self-administer the medication ordered.   furosemide 40 MG tablet Commonly known as: LASIX Take 40 mg by mouth daily.   gabapentin 300 MG capsule Commonly known as: NEURONTIN Take 300 mg by mouth 3 (three) times daily.   losartan 100 MG tablet Commonly known as: COZAAR Take 100 mg by mouth daily.   lovastatin 40 MG tablet Commonly known as: MEVACOR Take 1 tablet (40 mg total) by mouth every evening. Hold while on Daptomycin - can resume once course completed. Start taking on: February 27, 2022 What changed:  additional instructions These instructions start on February 27, 2022. If you are unsure what to do until then, ask your doctor or other care provider.   oxyCODONE-acetaminophen 7.5-325 MG tablet Commonly  known as: PERCOCET Take 1 tablet by mouth every 6 (six) hours as needed for severe pain. What changed: when to take this   polyethylene glycol 17 g packet Commonly known as: MIRALAX / GLYCOLAX Take 17 g by mouth daily.   potassium chloride 10 MEQ tablet Commonly known as: KLOR-CON M Take 10 mEq by mouth daily.   senna-docusate 8.6-50 MG tablet Commonly known as: Senokot-S Take 1 tablet by mouth at bedtime.   Vitamin D (Ergocalciferol) 1.25 MG (50000 UNIT) Caps capsule Commonly known as: DRISDOL Take 50,000 Units by mouth once a week.               Discharge Care Instructions  (From admission, onward)           Start     Ordered   01/20/22 0000  Change dressing on IV access line weekly and PRN  (Home infusion instructions - [facility] )        01/20/22 1406            Follow-up Information     Nolene Ebbs, MD.  Schedule an appointment as soon as possible for a visit in 1 week(s).   Specialty: Internal Medicine Why: After discharge from rehab Contact information: [place] [address] [phone]         Michel Bickers, MD Follow up in 4 week(s).   Specialty: Infectious Diseases Why: For the infcetion in your spine Contact information: [address] [place] [address] [phone]                 Discharge Exam: There were no vitals filed for this visit. General: NAD  Cardiovascular: S1, S2 present Respiratory: CTAB Abdomen: Soft, nontender, nondistended, bowel sounds present Musculoskeletal: No bilateral pedal edema noted Skin: Normal Psychiatry: Normal mood   Condition at discharge: stable  The results of significant diagnostics from this hospitalization (including imaging, microbiology, ancillary and laboratory) are listed below for reference.   Imaging Studies: MR Lumbar Spine W Wo Contrast  Result Date: 01/12/2022 CLINICAL DATA:  Chronic low back pain. Left leg numbness.  Prior surgery in 2020. EXAM: MRI LUMBAR SPINE WITHOUT AND WITH CONTRAST TECHNIQUE: Multiplanar and multiecho pulse sequences of the lumbar spine were obtained without and with intravenous contrast. CONTRAST:  57m MULTIHANCE GADOBENATE DIMEGLUMINE 529 MG/ML IV SOLN COMPARISON:  06/25/2020 FINDINGS: Segmentation:  Standard. Alignment: Grade 1 anterolisthesis of L4 on L5 secondary to facet disease. Vertebrae: No acute fracture. Severe disc space loss at L1-2 with fluid within the disc space with enhancement. Marrow edema on either side of the L1-2 disc space. Overall findings are most consistent with discitis-osteomyelitis at L1-2. 1.6 x 1 x 2.3 cm heterogeneous signal along the left ventral epidural space concerning for a phlegmon with small areas of non enhancement likely reflecting a developing abscess. Mass effect on the left intraspinal L2 nerve root. Conus medullaris and cauda equina: Conus extends to the L1 level. Conus and cauda equina appear normal. Paraspinal and other soft tissues: No acute paraspinal abnormality. Disc levels: Disc spaces: Posterior lumbar interbody fusion and decompression from L2 through L4. Degenerative disease with disc height loss at L5-S1 and L4-5. T12-L1: No significant disc bulge. No neural foraminal stenosis. No central canal stenosis. L1-L2: Severe disc space narrowing with fluid signal within the L1-2 disc space. 1.6 x 1 x 2.3 cm heterogeneous signal along the left ventral epidural space concerning for a phlegmon with small areas of non enhancement likely reflecting a developing abscess. Mass effect on the left intraspinal L2 nerve root. Severe central canal stenosis. Moderate left foraminal stenosis. Mild right foraminal stenosis. Severe bilateral facet arthropathy. L2-L3: Interbody fusion.  No foraminal or central canal stenosis. L3-L4: Interbody fusion. No foraminal or central canal stenosis. L4-L5: Broad-based disc bulge. Prior laminectomy. No foraminal or central canal  stenosis. L5-S1: Mild broad-based disc bulge. Posterior decompression. Severe bilateral facet arthropathy. Moderate right foraminal stenosis. No left foraminal stenosis. IMPRESSION: 1. 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 1.6 x 1 x 2.3 cm heterogeneous signal along the left ventral epidural space concerning for a phlegmon with small areas of non enhancement likely reflecting a developing abscess. Mass effect on the left intraspinal L2 nerve root. Severe central canal stenosis. Moderate left foraminal stenosis. Severe bilateral facet arthropathy. Electronically Signed   By: HKathreen DevoidM.D.   On: 01/12/2022 14:33   ECHOCARDIOGRAM COMPLETE  Result Date: 01/15/2022    ECHOCARDIOGRAM REPORT   Patient Name:   EJACORA HOPKINSDate of Exam: 01/15/2022 Medical Rec #:  [id number]    Height:  62.0 in Accession #:    [id number]    Weight:       206.0 lb Date of Birth:  1950/12/22    BSA:          1.936 m Patient Age:    52 years      BP:           115/52 mmHg Patient Gender: F             HR:           72 bpm. Exam Location:  Inpatient Procedure: 2D Echo Indications:    Fever  History:        Patient has no prior history of Echocardiogram examinations.                 Risk Factors:Hypertension and Dyslipidemia.  Sonographer:    Arlyss Gandy Referring Phys: Colbert  Sonographer Comments: Image acquisition challenging due to patient body habitus. IMPRESSIONS  1. Left ventricular ejection fraction, by estimation, is 65 to 70%. The left ventricle has normal function. The left ventricle has no regional wall motion abnormalities. Left ventricular diastolic parameters were normal.  2. Right ventricular systolic function is normal. The right ventricular size is normal. There is normal pulmonary artery systolic pressure.  3. Mild mitral valve regurgitation.  4. The aortic valve is normal in structure. Aortic valve regurgitation is not visualized.  5. The inferior vena cava is normal in size with greater than 50%  respiratory variability, suggesting right atrial pressure of 3 mmHg. FINDINGS  Left Ventricle: Left ventricular ejection fraction, by estimation, is 65 to 70%. The left ventricle has normal function. The left ventricle has no regional wall motion abnormalities. The left ventricular internal cavity size was normal in size. There is  no left ventricular hypertrophy. Left ventricular diastolic parameters were normal. Right Ventricle: The right ventricular size is normal. Right vetricular wall thickness was not assessed. Right ventricular systolic function is normal. There is normal pulmonary artery systolic pressure. The tricuspid regurgitant velocity is 1.93 m/s, and with an assumed right atrial pressure of 3 mmHg, the estimated right ventricular systolic pressure is 76.2 mmHg. Left Atrium: Left atrial size was normal in size. Right Atrium: Right atrial size was normal in size. Pericardium: There is no evidence of pericardial effusion. Mitral Valve: There is mild thickening of the mitral valve leaflet(s). Mild mitral valve regurgitation. Tricuspid Valve: The tricuspid valve is normal in structure. Tricuspid valve regurgitation is trivial. Aortic Valve: The aortic valve is normal in structure. Aortic valve regurgitation is not visualized. Aortic valve mean gradient measures 4.0 mmHg. Aortic valve peak gradient measures 7.8 mmHg. Aortic valve area, by VTI measures 1.72 cm. Pulmonic Valve: The pulmonic valve was grossly normal. Pulmonic valve regurgitation is not visualized. Aorta: The aortic root is normal in size and structure. Venous: The inferior vena cava is normal in size with greater than 50% respiratory variability, suggesting right atrial pressure of 3 mmHg. IAS/Shunts: No atrial level shunt detected by color flow Doppler.  LEFT VENTRICLE PLAX 2D LVIDd:         4.60 cm   Diastology LVIDs:         3.20 cm   LV e' medial:    6.74 cm/s LV PW:         1.00 cm   LV E/e' medial:  8.0 LV IVS:        1.00 cm   LV e'  lateral:   9.46 cm/s LVOT  diam:     1.90 cm   LV E/e' lateral: 5.7 LV SV:         55 LV SV Index:   29 LVOT Area:     2.84 cm  RIGHT VENTRICLE            IVC RV Basal diam:  2.50 cm    IVC diam: 1.80 cm RV Mid diam:    1.90 cm RV S prime:     8.92 cm/s TAPSE (M-mode): 1.8 cm LEFT ATRIUM             Index        RIGHT ATRIUM           Index LA diam:        3.60 cm 1.86 cm/m   RA Area:     12.80 cm LA Vol (A2C):   51.5 ml 26.60 ml/m  RA Volume:   25.50 ml  13.17 ml/m LA Vol (A4C):   49.4 ml 25.52 ml/m LA Biplane Vol: 51.6 ml 26.65 ml/m  AORTIC VALVE AV Area (Vmax):    1.71 cm AV Area (Vmean):   1.68 cm AV Area (VTI):     1.72 cm AV Vmax:           140.00 cm/s AV Vmean:          96.000 cm/s AV VTI:            0.321 m AV Peak Grad:      7.8 mmHg AV Mean Grad:      4.0 mmHg LVOT Vmax:         84.40 cm/s LVOT Vmean:        57.000 cm/s LVOT VTI:          0.195 m LVOT/AV VTI ratio: 0.61  AORTA Ao Root diam: 2.60 cm Ao Asc diam:  2.70 cm MITRAL VALVE               TRICUSPID VALVE MV Area (PHT): 3.30 cm    TR Peak grad:   14.9 mmHg MV Decel Time: 230 msec    TR Vmax:        193.00 cm/s MV E velocity: 53.60 cm/s MV A velocity: 87.40 cm/s  SHUNTS MV E/A ratio:  0.61        Systemic VTI:  0.20 m                            Systemic Diam: 1.90 cm Dorris Carnes MD Electronically signed by Dorris Carnes MD Signature Date/Time: 01/15/2022/11:35:00 AM    Final    VAS [place] LOWER EXTREMITY VENOUS (DVT)  Result Date: 01/15/2022  Lower Venous DVT Study Patient Name:  KONYA FAUBLE  Date of Exam:   01/15/2022 Medical Rec #: [id number]      Accession #:    [id number] Date of Birth: March 04, 1951     Patient Gender: F Patient Age:   52 years Exam Location:  [facility] Procedure:      VAS [place] LOWER EXTREMITY VENOUS (DVT) Referring Phys: Nyoka Lint DOUTOVA --------------------------------------------------------------------------------  Indications: Edema.  Limitations: Sensitivity at popliteal fossa and body habitus. Comparison Study:  No prior studies. Performing Technologist: Darlin Coco RDMS, RVT  Examination Guidelines: A complete evaluation includes B-mode imaging, spectral Doppler, color Doppler, and power Doppler as needed of all accessible portions of each vessel. Bilateral testing is considered an integral part of a complete examination. Limited examinations for  reoccurring indications may be performed as noted. The reflux portion of the exam is performed with the patient in reverse Trendelenburg.  +---------+---------------+---------+-----------+----------+-------------------+  RIGHT     Compressibility Phasicity Spontaneity Properties Thrombus Aging       +---------+---------------+---------+-----------+----------+-------------------+  CFV       Full            Yes       Yes                                         +---------+---------------+---------+-----------+----------+-------------------+  SFJ       Full                                                                  +---------+---------------+---------+-----------+----------+-------------------+  FV Prox   Full                                                                  +---------+---------------+---------+-----------+----------+-------------------+  FV Mid    Full                                                                  +---------+---------------+---------+-----------+----------+-------------------+  FV Distal Full                                                                  +---------+---------------+---------+-----------+----------+-------------------+  PFV       Full                                                                  +---------+---------------+---------+-----------+----------+-------------------+  POP       Full            Yes       Yes                                         +---------+---------------+---------+-----------+----------+-------------------+  PTV       Full                                                                   +---------+---------------+---------+-----------+----------+-------------------+  PERO                                                       Not well visualized  +---------+---------------+---------+-----------+----------+-------------------+   +----+---------------+---------+-----------+----------+--------------+  LEFT Compressibility Phasicity Spontaneity Properties Thrombus Aging  +----+---------------+---------+-----------+----------+--------------+  CFV  Full            Yes       Yes                                    +----+---------------+---------+-----------+----------+--------------+     Summary: RIGHT: - There is no evidence of deep vein thrombosis in the lower extremity. However, portions of this examination were limited- see technologist comments above.  - No cystic structure found in the popliteal fossa.  LEFT: - No evidence of common femoral vein obstruction.  *See table(s) above for measurements and observations. Electronically signed by Orlie Pollen on 01/15/2022 at 5:44:13 PM.    Final    [place] EKG SITE RITE  Result Date: 01/16/2022 If Site Rite image not attached, placement could not be confirmed due to current cardiac rhythm.  [place] EKG SITE RITE  Result Date: 01/15/2022 If [facility] image not attached, placement could not be confirmed due to current cardiac rhythm.  IR LUMBAR DISC ASPIRATION W/IMG GUIDE  Result Date: 01/20/2022 INDICATION: Severe low back pain secondary to discitis/osteomyelitis at L1-L2. EXAM: FLUOROSCOPIC GUIDED DISC ASPIRATION AT L1-L2 MEDICATIONS: The patient is currently admitted to the hospital and receiving intravenous antibiotics. The antibiotics were administered within an appropriate time frame prior to the initiation of the procedure. ANESTHESIA/SEDATION: Moderate (conscious) sedation was employed during this procedure. A total of Versed 2 mg and Fentanyl 50 mcg was administered intravenously by the radiology nurse. Total intra-service moderate Sedation Time: 15  minutes. The patient's level of consciousness and vital signs were monitored continuously by radiology nursing throughout the procedure under my direct supervision. COMPLICATIONS: None immediate. PROCEDURE: Informed written consent was obtained from the patient after a thorough discussion of the procedural risks, benefits and alternatives. All questions were addressed. Maximal Sterile Barrier Technique was utilized including caps, mask, sterile gowns, sterile gloves, sterile drape, hand hygiene and skin antiseptic. A timeout was performed prior to the initiation of the procedure. Patient was laid prone on the fluoroscopic table. The skin overlying the thoracolumbar region was then prepped and draped the usual manner. A right posterolateral approach was identified at L1-L2. Skin overlying this was infiltrated with 0.25% bupivacaine and carried into the underlying paraspinal musculature. Using biplane intermittent fluoroscopy, a 20 gauge Franseen needle was advanced under biplane intermittent fluoroscopy into the L1-L2 disc space, crossing the midline. Using a 20 mL syringe, approximately 3 mL of thick blood tinged aspirate was obtained and sent for microbiologic analysis. Needle was removed and hemostasis obtained at the skin entry site. Patient was then transferred to the floor in stable condition. IMPRESSION: Status post fluoroscopic guided disc aspiration at L1-L2 as described above without event. Electronically Signed   By: Luanne Bras M.D.   On: 01/20/2022 08:14    Microbiology: Results for orders placed or performed during the hospital encounter of 01/14/22  Resp Panel by RT-PCR (Flu A&B, Covid) Nasopharyngeal Swab     Status: None   Collection Time: 01/14/22  11:00 PM   Specimen: Nasopharyngeal Swab; Nasopharyngeal(NP) swabs in vial transport medium  Result Value Ref Range Status   SARS Coronavirus 2 by RT PCR NEGATIVE NEGATIVE Final    Comment: (NOTE) SARS-CoV-2 target nucleic acids are NOT  DETECTED.  The SARS-CoV-2 RNA is generally detectable in upper respiratory specimens during the acute phase of infection. The lowest concentration of SARS-CoV-2 viral copies this assay can detect is 138 copies/mL. A negative result does not preclude SARS-Cov-2 infection and should not be used as the sole basis for treatment or other patient management decisions. A negative result may occur with  improper specimen collection/handling, submission of specimen other than nasopharyngeal swab, presence of viral mutation(s) within the areas targeted by this assay, and inadequate number of viral copies(<138 copies/mL). A negative result must be combined with clinical observations, patient history, and epidemiological information. The expected result is Negative.  Fact Sheet for Patients:  EntrepreneurPulse.com.au  Fact Sheet for Healthcare Providers:  IncredibleEmployment.be  This test is no t yet approved or cleared by the [place] FDA and  has been authorized for detection and/or diagnosis of SARS-CoV-2 by FDA under an Emergency Use Authorization (EUA). This EUA will remain  in effect (meaning this test can be used) for the duration of the COVID-19 declaration under Section 564(b)(1) of the Act, 21 U.S.C.section 360bbb-3(b)(1), unless the authorization is terminated  or revoked sooner.       Influenza A by PCR NEGATIVE NEGATIVE Final   Influenza B by PCR NEGATIVE NEGATIVE Final    Comment: (NOTE) The Xpert Xpress SARS-CoV-2/FLU/RSV plus assay is intended as an aid in the diagnosis of influenza from Nasopharyngeal swab specimens and should not be used as a sole basis for treatment. Nasal washings and aspirates are unacceptable for Xpert Xpress SARS-CoV-2/FLU/RSV testing.  Fact Sheet for Patients: EntrepreneurPulse.com.au  Fact Sheet for Healthcare Providers: IncredibleEmployment.be  This test is not yet  approved or cleared by the [place] FDA and has been authorized for detection and/or diagnosis of SARS-CoV-2 by FDA under an Emergency Use Authorization (EUA). This EUA will remain in effect (meaning this test can be used) for the duration of the COVID-19 declaration under Section 564(b)(1) of the Act, 21 U.S.C. section 360bbb-3(b)(1), unless the authorization is terminated or revoked.  Performed at [facility], [place] [address], [place], [place] [address]   Fungus Culture With Stain     Status: None (Preliminary result)   Collection Time: 01/15/22 11:45 AM   Specimen: PATH Disc; Body Fluid  Result Value Ref Range Status   Fungus Stain Final report  Final    Comment: (NOTE) Performed At: [facility] [place], [place] [id number] Rush Farmer MD [id number]    Fungus (Mycology) Culture PENDING  Incomplete   Fungal Source L1 INTRAVERTEBRAL DISC FLUID  Final    Comment: Performed at [facility] Lab, [place] [address], [place], [place] [address]  Aerobic/Anaerobic Culture w Gram Stain (surgical/deep wound)     Status: None   Collection Time: 01/15/22 11:45 AM   Specimen: PATH Disc; Body Fluid  Result Value Ref Range Status   Specimen Description FLUID  Final   Special Requests L1 INTRAVERTEBRAL DISC  Final   Gram Stain NO WBC SEEN NO ORGANISMS SEEN   Final   Culture   Final    No growth aerobically or anaerobically. Performed at [facility], [place] [address], [place], [place] [address]    Report Status 01/20/2022 FINAL  Final  Fungus Culture Result     Status: None   Collection Time: 01/15/22 11:45 AM  Result Value Ref Range Status   Result 1 Comment  Final    Comment: (NOTE) KOH/Calcofluor preparation:  no fungus observed. Performed At: [facility] [place], [place] [id number] Rush Farmer MD YE:[id number]   Resp Panel by RT-PCR (Flu A&B, Covid) Nasopharyngeal Swab     Status: None   Collection Time:  01/20/22 11:29 AM   Specimen: Nasopharyngeal Swab; Nasopharyngeal(NP) swabs in vial transport medium  Result Value Ref Range Status   SARS Coronavirus 2 by RT PCR NEGATIVE NEGATIVE Final    Comment: (NOTE) SARS-CoV-2 target nucleic acids are NOT DETECTED.  The SARS-CoV-2 RNA is generally detectable in upper respiratory specimens during the acute phase of infection. The lowest concentration of SARS-CoV-2 viral copies this assay can detect is 138 copies/mL. A negative result does not preclude SARS-Cov-2 infection and should not be used as the sole basis for treatment or other patient management decisions. A negative result may occur with  improper specimen collection/handling, submission of specimen other than nasopharyngeal swab, presence of viral mutation(s) within the areas targeted by this assay, and inadequate number of viral copies(<138 copies/mL). A negative result must be combined with clinical observations, patient history, and epidemiological information. The expected result is Negative.  Fact Sheet for Patients:  EntrepreneurPulse.com.au  Fact Sheet for Healthcare Providers:  IncredibleEmployment.be  This test is no t yet approved or cleared by the [place] FDA and  has been authorized for detection and/or diagnosis of SARS-CoV-2 by FDA under an Emergency Use Authorization (EUA). This EUA will remain  in effect (meaning this test can be used) for the duration of the COVID-19 declaration under Section 564(b)(1) of the Act, 21 U.S.C.section 360bbb-3(b)(1), unless the authorization is terminated  or revoked sooner.       Influenza A by PCR NEGATIVE NEGATIVE Final   Influenza B by PCR NEGATIVE NEGATIVE Final    Comment: (NOTE) The Xpert Xpress SARS-CoV-2/FLU/RSV plus assay is intended as an aid in the diagnosis of influenza from Nasopharyngeal swab specimens and should not be used as a sole basis for treatment. Nasal washings  and aspirates are unacceptable for Xpert Xpress SARS-CoV-2/FLU/RSV testing.  Fact Sheet for Patients: EntrepreneurPulse.com.au  Fact Sheet for Healthcare Providers: IncredibleEmployment.be  This test is not yet approved or cleared by the [place] FDA and has been authorized for detection and/or diagnosis of SARS-CoV-2 by FDA under an Emergency Use Authorization (EUA). This EUA will remain in effect (meaning this test can be used) for the duration of the COVID-19 declaration under Section 564(b)(1) of the Act, 21 U.S.C. section 360bbb-3(b)(1), unless the authorization is terminated or revoked.  Performed at [facility], [place] [address] [place], [place] [address]     Labs: CBC: Recent Labs  Lab 01/15/22 0245 01/16/22 0211 01/17/22 0259 01/18/22 0050 01/19/22 0222  WBC 5.9 5.8 6.4 6.1 5.8  NEUTROABS 2.4 2.5 2.5 3.2 2.2  HGB 10.9* 9.9* 9.9* 10.6* 10.0*  HCT 34.4* 32.6* 32.6* 34.3* 33.5*  MCV 76.1* 75.6* 75.8* 75.2* 76.1*  PLT 280 274 271 286 021   Basic Metabolic Panel: Recent Labs  Lab 01/15/22 0245 01/16/22 0211 01/17/22 0259 01/18/22 0050 01/19/22 0222  NA 143 139 141 143 138  K 3.2* 3.2* 3.7 3.3* 4.1  CL 107 106 106 105 103  CO2 25 25 27 29 29   GLUCOSE 117* 175* 126* 121* 116*  BUN 11 12 9 9 12   CREATININE 0.69 0.86 0.56 0.58 0.64  CALCIUM 8.9 8.6* 8.8* 9.2 8.8*  MG 2.2 2.1  --  2.2  --   PHOS 3.7  --   --   --   --    Liver Function Tests: Recent Labs  Lab 01/15/22 0245  AST 21  ALT 17  ALKPHOS 110  BILITOT 0.5  PROT 6.3*  ALBUMIN 3.7   CBG: No results for input(s): GLUCAP in the last 168 hours.  Discharge time spent: greater than 30 minutes.  Signed: Alma Friendly, MD [facility] 01/20/2022

## 2022-01-20 NOTE — Progress Notes (Signed)
Inpatient Rehab Admissions Coordinator:   Notified by insurance that request for CIR has been denied.  MD and [facility] team aware.  I will let pt/family know.  Shann Medal, PT, DPT Admissions Coordinator [phone] 01/20/22  8:42 AM

## 2022-01-20 NOTE — Progress Notes (Addendum)
PHARMACY CONSULT NOTE FOR:  OUTPATIENT  PARENTERAL ANTIBIOTIC THERAPY (OPAT)  Indication: Discitis/Osteo Regimen: Daptomycin 550 mg IV every 24 hours + Ertapenem 1g IV every 24 hours End date: 02/26/22  Patient plan is to d/c to SNF who has okayed this regimen. Will also plan to hold the patient's stain (lovastatin) until Daptomycin course is completed. Lovastatin can be resumed on 02/27/22.   IV antibiotic discharge orders are pended. To discharging provider:  please sign these orders via discharge navigator,  Select New Orders & click on the button choice - Manage This Unsigned Work.     Thank you for allowing pharmacy to be a part of this patients care.  Georgina Pillion, PharmD, BCPS Clinical Pharmacist 01/20/2022 12:15 PM   **Pharmacist phone directory can now be found on [email or web address] ([email or web address]).  Listed under [facility].

## 2022-01-20 NOTE — Progress Notes (Addendum)
[facility]    Date of Admission:  01/14/2022   Total days of antibiotics 6           ID: Hayley Padilla is a 71 y.o. female with lumbar discitis Active Problems:   Obesity (BMI 30-39.9)   Discitis of lumbar region   Essential hypertension   Chronic pain   HLD (hyperlipidemia)   Leg edema, right   Subjective: Afebrile, not having any issues with abtx.  Still having some back pain.  Medications:   amLODipine  10 mg Oral Daily   Chlorhexidine Gluconate Cloth  6 each Topical Daily   cloNIDine  0.1 mg Oral BID   dextrose  12.5 g Intravenous STAT   enoxaparin (LOVENOX) injection  40 mg Subcutaneous Q24H   gabapentin  300 mg Oral TID   losartan  100 mg Oral Daily   polyethylene glycol  17 g Oral BID   pravastatin  40 mg Oral q1800   senna-docusate  1 tablet Oral BID   sodium chloride flush  10-40 mL Intracatheter Q12H    Objective: Vital signs in last 24 hours: Temp:  [97.6 F (36.4 C)-98.8 F (37.1 C)] 97.6 F (36.4 C) (02/07 0743) Pulse Rate:  [79-95] 81 (02/07 0743) Resp:  [17-18] 18 (02/07 0743) BP: (129-148)/(65-81) 139/72 (02/07 0743) SpO2:  [97 %-100 %] 100 % (02/07 0743) Physical Exam  Constitutional:  oriented to person, place, and time. appears well-developed and well-nourished. No distress.  HENT: [place]/AT, PERRLA, no scleral icterus Mouth/Throat: Oropharynx is clear and moist. No oropharyngeal exudate.  Cardiovascular: Normal rate, regular rhythm and normal heart sounds. Exam reveals no gallop and no friction rub.  No murmur heard.  Pulmonary/Chest: Effort normal and breath sounds normal. No respiratory distress.  has no wheezes.  Neck = supple, no nuchal rigidity Abdominal: Soft. Bowel sounds are normal.  exhibits no distension. There is no tenderness.  Lymphadenopathy: no cervical adenopathy. No axillary adenopathy Neurological: alert and oriented to person, place, and time.  Skin: Skin is warm and dry. No rash noted. No erythema.   Psychiatric: a normal mood and affect.  behavior is normal.    Lab Results Recent Labs    01/18/22 0050 01/19/22 0222  WBC 6.1 5.8  HGB 10.6* 10.0*  HCT 34.3* 33.5*  NA 143 138  K 3.3* 4.1  CL 105 103  CO2 29 29  BUN 9 12  CREATININE 0.58 0.64    Microbiology: Cx= culture negative Studies/Results: No results found.   Assessment/Plan: 71yo F with lumbar discitis, culture negative --  - will change abtx course for -  to daptomycin instead of vancomycin. Dose at 8mg /kg daily. Will need weekly ck, bmp.  - to minimize drug interaction, we ask that she holds her statin for the next 6 wk. - in terms of gram negative bacteremia, we will change meropenem to ertapenem for ease of dosing. - will have follow up in ID clinic, Dr , in 4-5 wk. - picc line care per protocol at snf  Chronic back pain = keep on her scheduled pain medications  Deconditioning, frequent falls = recommend to continue to participate with PT  -- spent 35 min in coordination of care for discitis  End date for abtx: March 16th ---------------------------------------- Diagnosis: discitis  Culture Result: culture negative  Allergies  Allergen Reactions   Penicillins Anaphylaxis    Did it involve swelling of the face/tongue/throat, SOB, or low BP? Yes Did it involve sudden or  severe rash/hives, skin peeling, or any reaction on the inside of your mouth or nose? No Did you need to seek medical attention at a hospital or doctor's office? Yes When did it last happen?      "Been a While"  If all above answers are NO, may proceed with cephalosporin use.    Aspirin Other (See Comments)    brusing _ patient requested to be listed    OPAT Orders Discharge antibiotics to be given via PICC line Discharge antibiotics: Per pharmacy protocol daptomycin plus ertapenem  Duration: 6 wk End Date: 02/26/2022  [facility] Care Per Protocol:  Home health RN for IV administration and teaching; PICC line care  and labs.    Labs weekly while on IV antibiotics: _x_ CBC with differential _x_ BMP  __x CK  _x_ Please pull PIC at completion of IV antibiotics   Fax weekly labs to [phone]  Clinic Follow Up Appt: 4-5 wk with dr Orvan Falconer  @ [facility]  [facility] for Infectious Diseases Pager: [phone]  01/20/2022, 11:39 AM

## 2022-01-20 NOTE — TOC Transition Note (Signed)
Transition of Care [facility]) - CM/SW Discharge Note   Patient Details  Name: Hayley Padilla MRN: [id number] Date of Birth: 10-29-1951  Transition of Care [facility]) CM/SW Contact:  Kermit Balo, RN Phone Number: 01/20/2022, 1:48 PM   Clinical Narrative:    Patient is discharging to [facility] today. She will be transported by her daughter. Discharge packet is at the desk. Bedside RN updated.   Room: 103 Number for report: [phone]   Final next level of care: Skilled Nursing Facility Barriers to Discharge: No Barriers Identified   Patient Goals and CMS Choice   CMS Medicare.gov Compare Post Acute Care list provided to:: Patient Represenative (must comment) Choice offered to / list presented to : Patient, Adult Children  Discharge Placement PASRR number recieved: 01/19/22            Patient chooses bed at: [facility] Patient to be transferred to facility by: daughter Name of family member notified: [place] Patient and family notified of of transfer: 01/20/22  Discharge Plan and Services In-house Referral: Clinical Social Work Discharge Planning Services: [employer] Consult Post Acute Care Choice: Skilled Nursing Facility                               Social Determinants of Health (SDOH) Interventions     Readmission Risk Interventions No flowsheet data found.

## 2022-01-20 NOTE — Progress Notes (Signed)
Report given to [place] at [facility] reflecting patient's current status. Daughter will pick up her at 1530

## 2022-01-20 NOTE — Progress Notes (Signed)
Occupational Therapy Treatment Patient Details Name: Hayley Padilla MRN: [id number] DOB: 03/13/1951 Today's Date: 01/20/2022   History of present illness 70 y.o. female presents to [facility] on 2/1 with worsening back pain and recently developed numbness and weakness in her right leg. multiple falls recently.  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 PMH includes HTN, HLD, DDD, posterior spinal fusion at the L4-5 level in 2011.   OT comments  Patient up in recliner and motivated towards therapy.  Patient making good gains and was able to perform self care tasks standing at sink with sink for support for balanced.  Toilet transfer training performed with RW with education on hand placement and safe transfers. Acute OT to continue to follow.    Recommendations for follow up therapy are one component of a multi-disciplinary discharge planning process, led by the attending physician.  Recommendations may be updated based on patient status, additional functional criteria and insurance authorization.    Follow Up Recommendations  Acute inpatient rehab (3hours/day)    Assistance Recommended at Discharge Frequent or constant Supervision/Assistance  Patient can return home with the following  A little help with walking and/or transfers;A little help with bathing/dressing/bathroom;Assistance with cooking/housework   Equipment Recommendations  Other (comment) (TBD)    Recommendations for Other Services      Precautions / Restrictions Precautions Precautions: Fall;Back Precaution Booklet Issued: No Precaution Comments: For patient comfort Restrictions Weight Bearing Restrictions: No       Mobility Bed Mobility Overal bed mobility: Needs Assistance             General bed mobility comments: up in the chair on arrival     Transfers Overall transfer level: Needs assistance Equipment used: Rolling walker (2 wheels) Transfers: Sit to/from Stand Sit to Stand: Min guard           General transfer comment: performed transfers and mobility with min guard and verbal cues for safety     Balance Overall balance assessment: Needs assistance Sitting-balance support: Bilateral upper extremity supported Sitting balance-Leahy Scale: Fair     Standing balance support: Single extremity supported, No upper extremity supported Standing balance-Leahy Scale: Poor Standing balance comment: stood at sink for self care and was reliant on sink or RW for support                           ADL either performed or assessed with clinical judgement   ADL Overall ADL's : Needs assistance/impaired     Grooming: Min guard;Standing Grooming Details (indicate cue type and reason): performed standing at sink Upper Body Bathing: Min guard;Standing       Upper Body Dressing : Minimal assistance;Standing Upper Body Dressing Details (indicate cue type and reason): changed gown while standing     Toilet Transfer: Min guard;BSC/3in1;Rolling walker (2 wheels) Toilet Transfer Details (indicate cue type and reason): tiolet transfer training with education on safety         Functional mobility during ADLs: Min guard;Rolling walker (2 wheels) General ADL Comments: ttransfer training performed with education on safety with hand placement    Extremity/Trunk Assessment              Vision       Perception     Praxis      Cognition Arousal/Alertness: Awake/alert Behavior During Therapy: [facility] for tasks  assessed/performed Overall Cognitive Status: Within Functional Limits for tasks assessed                                 General Comments: able to recall 2/3 spinal precautions        Exercises      Shoulder Instructions       General Comments      Pertinent Vitals/ Pain        Pain Assessment Pain Assessment: Faces Faces Pain Scale: Hurts little more Pain Location: back Pain Descriptors / Indicators: Sore, Discomfort Pain Intervention(s): Limited activity within patient's tolerance, Monitored during session, Patient requesting pain meds-RN notified  Home Living                                          Prior Functioning/Environment              Frequency  Min 2X/week        Progress Toward Goals  OT Goals(current goals can now be found in the care plan section)  Progress towards OT goals: Progressing toward goals  Acute Rehab OT Goals Patient Stated Goal: get better OT Goal Formulation: With patient Time For Goal Achievement: 01/29/22 Potential to Achieve Goals: Good ADL Goals Pt Will Perform Lower Body Bathing: with modified independence;with adaptive equipment;sitting/lateral leans;sit to/from stand Pt Will Perform Lower Body Dressing: with modified independence;sitting/lateral leans;sit to/from stand;with adaptive equipment Pt Will Transfer to Toilet: with modified independence;ambulating Pt Will Perform Toileting - Clothing Manipulation and hygiene: with modified independence;sitting/lateral leans;sit to/from stand;with adaptive equipment Additional ADL Goal #1: Pt will follow 3/3 spinal precautions 100% of the time.  Plan Discharge plan remains appropriate    Co-evaluation                 AM-PAC OT "6 Clicks" Daily Activity     Outcome Measure   Help from another person eating meals?: A Little Help from another person taking care of personal grooming?: A Little Help from another person toileting, which includes using toliet, bedpan, or urinal?: A Lot Help from another person bathing (including washing, rinsing, drying)?: A Little Help from another person to put on and taking off regular upper body clothing?: A Little Help from another person to put on and taking off regular lower body clothing?: A Lot 6  Click Score: 16    End of Session Equipment Utilized During Treatment: Gait belt;Rolling walker (2 wheels)  OT Visit Diagnosis: Unsteadiness on feet (R26.81);Other abnormalities of gait and mobility (R26.89);Muscle weakness (generalized) (M62.81)   Activity Tolerance Patient tolerated treatment well   Patient Left in chair;with call bell/phone within reach   Nurse Communication Mobility status        Time: 4496-7591 OT Time Calculation (min): 25 min  Charges: OT General Charges $OT Visit: 1 Visit OT Treatments $Self Care/Home Management : 23-37 mins  Alfonse Flavors, OTA Acute Rehabilitation Services  Pager [phone] Office [phone]   Dewain Penning 01/20/2022, 9:23 AM

## 2022-01-27 ENCOUNTER — Telehealth: Payer: Self-pay | Admitting: Internal Medicine

## 2022-02-12 LAB — FUNGUS CULTURE RESULT

## 2022-02-12 LAB — FUNGUS CULTURE WITH STAIN

## 2022-02-12 LAB — FUNGAL ORGANISM REFLEX

## 2022-02-17 ENCOUNTER — Ambulatory Visit (INDEPENDENT_AMBULATORY_CARE_PROVIDER_SITE_OTHER): Payer: 59 | Admitting: Internal Medicine

## 2022-02-17 ENCOUNTER — Encounter: Payer: Self-pay | Admitting: Internal Medicine

## 2022-02-17 ENCOUNTER — Other Ambulatory Visit: Payer: Self-pay

## 2022-02-17 DIAGNOSIS — M4646 Discitis, unspecified, lumbar region: Secondary | ICD-10-CM | POA: Diagnosis not present

## 2022-02-17 NOTE — Progress Notes (Signed)
?  ? ? ? ? ?[facility] ? ?Patient Active Problem List  ? Diagnosis Date Noted  ? Discitis of lumbar region 01/13/2022  ?  Priority: High  ? Leg edema, right 01/15/2022  ? Essential hypertension 01/14/2022  ? Chronic pain 01/14/2022  ? HLD (hyperlipidemia) 01/14/2022  ? Discitis 01/14/2022  ? Obesity (BMI 30-39.9) 01/13/2022  ? S/P lumbar spinal fusion 01/13/2022  ? Kyphosis 01/13/2022  ? Degenerative spondylolisthesis 06/12/2019  ? ? ?Patient's Medications  ?New Prescriptions  ? No medications on file  ?Previous Medications  ? AMLODIPINE (NORVASC) 10 MG TABLET    Take 10 mg by mouth daily.  ? CLONIDINE (CATAPRES) 0.1 MG TABLET    Take 0.1 mg by mouth 2 (two) times daily.  ? CYCLOBENZAPRINE (FLEXERIL) 10 MG TABLET    Take 10 mg by mouth 3 (three) times daily as needed for muscle spasms.  ? DAPTOMYCIN (CUBICIN) IVPB    Inject 550 mg into the vein daily. Indication:  Discitis/Osteo ?First Dose: Yes ?Last Day of Therapy:  02/26/22 ?Labs - Once weekly:  CBC/D, BMP, and CPK ?Labs - Every other week:  ESR and CRP ?Method of administration: IV Push ?Pull PICC at the end of therapy ?Method of administration may be changed at the discretion of home infusion pharmacist based upon assessment of the patient and/or caregiver's ability to self-administer the medication ordered.  ? ERTAPENEM (INVANZ) IVPB    Inject 1 g into the vein daily. Indication:  Discitis/osteo ?First Dose: Yes ?Last Day of Therapy:  02/26/22 ?Labs - Once weekly:  CBC/D and BMP, ?Labs - Every other week:  ESR and CRP ?Method of administration: Mini-Bag Plus / Gravity ?Pull PICC at the end of IV therapy ?Method of administration may be changed at the discretion of home infusion pharmacist based upon assessment of the patient and/or caregiver's ability to self-administer the medication ordered.  ? FUROSEMIDE (LASIX) 40 MG TABLET    Take 40 mg by mouth daily.  ? GABAPENTIN (NEURONTIN) 300 MG CAPSULE    Take 300 mg by mouth 3 (three)  times daily.  ? LOSARTAN (COZAAR) 100 MG TABLET    Take 100 mg by mouth daily.  ? LOVASTATIN (MEVACOR) 40 MG TABLET    Take 1 tablet (40 mg total) by mouth every evening. Hold while on Daptomycin - can resume once course completed.  ? OXYCODONE-ACETAMINOPHEN (PERCOCET) 7.5-325 MG TABLET    Take 1 tablet by mouth every 6 (six) hours as needed for severe pain.  ? POLYETHYLENE GLYCOL (MIRALAX / GLYCOLAX) 17 G PACKET    Take 17 g by mouth daily.  ? POTASSIUM CHLORIDE (K-DUR) 10 MEQ TABLET    Take 10 mEq by mouth daily.  ? SENNA-DOCUSATE (SENOKOT-S) 8.6-50 MG TABLET    Take 1 tablet by mouth at bedtime.  ? VITAMIN D, ERGOCALCIFEROL, (DRISDOL) 1.25 MG (50000 UNIT) CAPS CAPSULE    Take 50,000 Units by mouth once a week.  ?Modified Medications  ? No medications on file  ?Discontinued Medications  ? No medications on file  ? ? ?Subjective: ?Hayley Padilla is in for her hospital follow-up visit.  She is accompanied by her daughter and granddaughter.  She was diagnosed with L1-2 discitis last month.  An aspirate Gram stain showed no organisms and cultures were negative.  She was seen by my partner and started on daptomycin and ertapenem.  She is now completed about 5 weeks of therapy.  She says that she has not noted  any change in her low back pain.  She says she is still taking oxycodone 7.5 mg about 3 times daily, the same dose as she was when she was discharged from the hospital.  She says she is making slow progress with physical therapy.  She denies any problems tolerating her PICC line or antibiotics. ? ?Review of Systems: ?Review of Systems  ?Constitutional:  Negative for chills, diaphoresis and fever.  ?Gastrointestinal:  Negative for abdominal pain, diarrhea, nausea and vomiting.  ? ?Past Medical History:  ?Diagnosis Date  ? Allergy   ? Arthritis   ? Hyperlipidemia   ? Hypertension   ? PONV (postoperative nausea and vomiting)   ? ? ?Social History  ? ?Tobacco Use  ? Smoking status: Never  ? Smokeless tobacco: Current  ?   Types: Chew  ? Tobacco comments:  ?  last used 03/17/2020 @ 2200  ?Vaping Use  ? Vaping Use: Never used  ?Substance Use Topics  ? Alcohol use: Not Currently  ? Drug use: Never  ? ? ?Family History  ?Problem Relation Age of Onset  ? Colon cancer Neg Hx   ? Colon polyps Neg Hx   ? Esophageal cancer Neg Hx   ? Rectal cancer Neg Hx   ? Stomach cancer Neg Hx   ? ? ?Allergies  ?Allergen Reactions  ? Penicillins Anaphylaxis  ?  Did it involve swelling of the face/tongue/throat, SOB, or low BP? Yes ?Did it involve sudden or severe rash/hives, skin peeling, or any reaction on the inside of your mouth or nose? No ?Did you need to seek medical attention at a hospital or doctor's office? Yes ?When did it last happen?      "Been a While"  ?If all above answers are ?NO?, may proceed with cephalosporin use. ?  ? Aspirin Other (See Comments)  ?  brusing _ patient requested to be listed  ? ? ?Objective: ?Vitals:  ? 02/17/22 1413  ?BP: 129/73  ?Pulse: 91  ?Temp: 98.2 ?F (36.8 ?C)  ?TempSrc: Oral  ?Weight: 210 lb (95.3 kg)  ? ?Body mass index is 38.41 kg/m?. ? ?Physical Exam ?Constitutional:   ?   Comments: She is very calm and pleasant.  ?Cardiovascular:  ?   Rate and Rhythm: Normal rate and regular rhythm.  ?   Heart sounds: No murmur heard. ?Pulmonary:  ?   Effort: Pulmonary effort is normal.  ?   Breath sounds: Normal breath sounds.  ?Musculoskeletal:  ?   Right lower leg: Edema present.  ?   Left lower leg: Edema present.  ?Skin: ?   Comments: Right arm PICC site looks good.  ?Psychiatric:     ?   Mood and Affect: Mood normal.  ? ? ?Lab Results ? ?  ?Problem List Items Addressed This Visit   ? ?  ? High  ? Discitis of lumbar region  ?  I do not have any records from her skilled nursing facility.  I will obtain blood work here today and arrange phone follow-up next week at the 6-week mark of therapy to discuss optimal duration of IV daptomycin and ertapenem. ?  ?  ? Relevant Orders  ? CBC  ? Basic metabolic panel  ? C-reactive  protein  ? Sedimentation rate  ? ? ? ?Michel Bickers, MD ?[facility] ?[place] ?[phone] pager   [phone] cell ?02/17/2022, 2:44 PM ?

## 2022-02-17 NOTE — Progress Notes (Signed)
Labs drawn via PICC line per Dr. Orvan Falconer. Line flushed easily with good blood return. Line flushed, heparin locked, and clamped. Patient tolerated well.  ? ?Sandie Ano, RN ? ?

## 2022-02-17 NOTE — Assessment & Plan Note (Signed)
I do not have any records from her skilled nursing facility.  I will obtain blood work here today and arrange phone follow-up next week at the 6-week mark of therapy to discuss optimal duration of IV daptomycin and ertapenem. ?

## 2022-02-18 LAB — CBC
HCT: 33.9 % — ABNORMAL LOW (ref 35.0–45.0)
Hemoglobin: 10.4 g/dL — ABNORMAL LOW (ref 11.7–15.5)
MCH: 23.1 pg — ABNORMAL LOW (ref 27.0–33.0)
MCHC: 30.7 g/dL — ABNORMAL LOW (ref 32.0–36.0)
MCV: 75.3 fL — ABNORMAL LOW (ref 80.0–100.0)
MPV: 9.9 fL (ref 7.5–12.5)
Platelets: 330 10*3/uL (ref 140–400)
RBC: 4.5 10*6/uL (ref 3.80–5.10)
RDW: 14.3 % (ref 11.0–15.0)
WBC: 5.6 10*3/uL (ref 3.8–10.8)

## 2022-02-18 LAB — SEDIMENTATION RATE: Sed Rate: 25 mm/h (ref 0–30)

## 2022-02-18 LAB — BASIC METABOLIC PANEL
BUN: 14 mg/dL (ref 7–25)
CO2: 27 mmol/L (ref 20–32)
Calcium: 9.2 mg/dL (ref 8.6–10.4)
Chloride: 104 mmol/L (ref 98–110)
Creat: 0.7 mg/dL (ref 0.60–1.00)
Glucose, Bld: 124 mg/dL — ABNORMAL HIGH (ref 65–99)
Potassium: 3.7 mmol/L (ref 3.5–5.3)
Sodium: 142 mmol/L (ref 135–146)

## 2022-02-18 LAB — C-REACTIVE PROTEIN: CRP: 9.7 mg/L — ABNORMAL HIGH (ref <8.0)

## 2022-02-25 ENCOUNTER — Other Ambulatory Visit: Payer: Self-pay

## 2022-02-25 ENCOUNTER — Telehealth: Payer: Self-pay

## 2022-02-25 ENCOUNTER — Ambulatory Visit (INDEPENDENT_AMBULATORY_CARE_PROVIDER_SITE_OTHER): Payer: 59 | Admitting: Internal Medicine

## 2022-02-25 DIAGNOSIS — M4646 Discitis, unspecified, lumbar region: Secondary | ICD-10-CM | POA: Diagnosis not present

## 2022-02-25 MED ORDER — DOXYCYCLINE HYCLATE 100 MG PO TABS: 100.0000 mg | ORAL_TABLET | Freq: Two times a day (BID) | ORAL | 0 refills | Status: DC

## 2022-02-25 MED ORDER — LEVOFLOXACIN 500 MG PO TABS
500.0000 mg | ORAL_TABLET | Freq: Every day | ORAL | 0 refills | Status: DC
Start: 2022-02-25 — End: 2022-03-18

## 2022-02-25 NOTE — Progress Notes (Signed)
Virtual Visit via Telephone Note ? ?I connected with Clarnce Flock on 02/25/22 at  9:30 AM EDT by telephone and verified that I am speaking with the correct person using two identifiers. ? ?Location: ?Patient: Hayley Padilla ?Provider: [facility] ?  ?I discussed the limitations, risks, security and privacy concerns of performing an evaluation and management service by telephone and the availability of in person appointments. I also discussed with the patient that there may be a patient responsible charge related to this service. The patient expressed understanding and agreed to proceed. ? ? ?History of Present Illness: ?I called and spoke to Ms. Gail and her daughter, Adair Laundry today.  Ms. 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  She has not had any problems tolerating her PICC or antibiotics.  She says it feels like she is still having muscle spasms in her back.  Overall her pain is a little bit better over the last 6 weeks but unchanged since we spoke last week.  She is hoping to go home today or tomorrow if I give orders to have her PICC removed. ? ?Her daughter says that her primary concern is the fact that Ms. Uhrich has been having recurrent falls going back at least 6 months before she was hospitalized.  They do not think that her PCP is aware of this. ?  ?Observations/Objective: ?Sed Rate  ?Date Value  ?02/17/2022 25 mm/h  ?01/15/2022 12 mm/hr  ?01/13/2022 11 mm/h  ? ?CRP (mg/L)  ?Date Value  ?02/17/2022 9.7 (H)  ?01/13/2022 3.5  ?  ? ?Assessment and Plan: ?I will give orders to stop her IV antibiotics and have her PICC removed after today's doses.  I will start her on oral doxycycline and levofloxacin for at least 2 more weeks. ? ?I encouraged them to arrange a follow-up visit with her PCP as soon as possible to review her medication list and discuss her recurrent falls. ? ?Follow Up Instructions: ?Discontinue daptomycin and ertapenem ?Have PICC  removed ?Start oral doxycycline and levofloxacin ?Phone follow-up in 2 weeks ?  ?I discussed the assessment and treatment plan with the patient. The patient was provided an opportunity to ask questions and all were answered. The patient agreed with the plan and demonstrated an understanding of the instructions. ?  ?The patient was advised to call back or seek an in-person evaluation if the symptoms worsen or if the condition fails to improve as anticipated. ? ?I provided 18 minutes of non-face-to-face time during this encounter. ? ? ?Cliffton Asters, MD ? ?

## 2022-02-25 NOTE — Telephone Encounter (Signed)
I spoke with [place] with [facility] [phone])and gave orders for patient to stop IV antibiotics and picc line could be removed now per Dr. Megan Salon. I also advised her that patient will start oral antibiotics levofloxacin and doxycycline. I have faxed Dr. Hale Bogus note to the SNF with the orders. ?[name] [name] ? ?

## 2022-03-10 ENCOUNTER — Other Ambulatory Visit: Payer: Self-pay | Admitting: Internal Medicine

## 2022-03-11 ENCOUNTER — Emergency Department (HOSPITAL_COMMUNITY): Payer: Medicare Other

## 2022-03-11 ENCOUNTER — Other Ambulatory Visit: Payer: Self-pay

## 2022-03-11 ENCOUNTER — Encounter (HOSPITAL_COMMUNITY): Payer: Self-pay

## 2022-03-11 ENCOUNTER — Inpatient Hospital Stay (HOSPITAL_COMMUNITY)
Admission: EM | Admit: 2022-03-11 | Discharge: 2022-03-18 | DRG: 460 | Disposition: A | Discharge status: Skilled Nursing Facility | Payer: Medicare Other | Attending: Internal Medicine | Admitting: Internal Medicine

## 2022-03-11 ENCOUNTER — Ambulatory Visit (INDEPENDENT_AMBULATORY_CARE_PROVIDER_SITE_OTHER): Payer: 59 | Admitting: Internal Medicine

## 2022-03-11 DIAGNOSIS — E669 Obesity, unspecified: Secondary | ICD-10-CM | POA: Diagnosis present

## 2022-03-11 DIAGNOSIS — M4620 Osteomyelitis of vertebra, site unspecified: Secondary | ICD-10-CM

## 2022-03-11 DIAGNOSIS — Z880 Allergy status to penicillin: Secondary | ICD-10-CM

## 2022-03-11 DIAGNOSIS — R600 Localized edema: Secondary | ICD-10-CM

## 2022-03-11 DIAGNOSIS — E785 Hyperlipidemia, unspecified: Secondary | ICD-10-CM | POA: Diagnosis present

## 2022-03-11 DIAGNOSIS — Z79899 Other long term (current) drug therapy: Secondary | ICD-10-CM

## 2022-03-11 DIAGNOSIS — E876 Hypokalemia: Secondary | ICD-10-CM | POA: Diagnosis present

## 2022-03-11 DIAGNOSIS — F17220 Nicotine dependence, chewing tobacco, uncomplicated: Secondary | ICD-10-CM | POA: Diagnosis present

## 2022-03-11 DIAGNOSIS — I10 Essential (primary) hypertension: Secondary | ICD-10-CM | POA: Diagnosis present

## 2022-03-11 DIAGNOSIS — R296 Repeated falls: Secondary | ICD-10-CM | POA: Diagnosis present

## 2022-03-11 DIAGNOSIS — M4646 Discitis, unspecified, lumbar region: Principal | ICD-10-CM | POA: Diagnosis present

## 2022-03-11 DIAGNOSIS — Z886 Allergy status to analgesic agent status: Secondary | ICD-10-CM

## 2022-03-11 DIAGNOSIS — Z981 Arthrodesis status: Secondary | ICD-10-CM

## 2022-03-11 DIAGNOSIS — Z6838 Body mass index (BMI) 38.0-38.9, adult: Secondary | ICD-10-CM

## 2022-03-11 DIAGNOSIS — K5909 Other constipation: Secondary | ICD-10-CM | POA: Diagnosis present

## 2022-03-11 DIAGNOSIS — M532X6 Spinal instabilities, lumbar region: Secondary | ICD-10-CM | POA: Diagnosis not present

## 2022-03-11 DIAGNOSIS — M4316 Spondylolisthesis, lumbar region: Secondary | ICD-10-CM | POA: Diagnosis present

## 2022-03-11 LAB — CBC
HCT: 37.4 % (ref 35.0–45.0)
Hemoglobin: 11.6 g/dL — ABNORMAL LOW (ref 11.7–15.5)
MCH: 23.3 pg — ABNORMAL LOW (ref 27.0–33.0)
MCHC: 31 g/dL — ABNORMAL LOW (ref 32.0–36.0)
MCV: 75.3 fL — ABNORMAL LOW (ref 80.0–100.0)
MPV: 9.9 fL (ref 7.5–12.5)
Platelets: 325 10*3/uL (ref 140–400)
RBC: 4.97 10*6/uL (ref 3.80–5.10)
RDW: 14.4 % (ref 11.0–15.0)
WBC: 5.8 10*3/uL (ref 3.8–10.8)

## 2022-03-11 LAB — CBC WITH DIFFERENTIAL/PLATELET
Abs Immature Granulocytes: 0.01 10*3/uL (ref 0.00–0.07)
Basophils Absolute: 0 10*3/uL (ref 0.0–0.1)
Basophils Relative: 0 %
Eosinophils Absolute: 0.2 10*3/uL (ref 0.0–0.5)
Eosinophils Relative: 3 %
HCT: 38.2 % (ref 36.0–46.0)
Hemoglobin: 11.7 g/dL — ABNORMAL LOW (ref 12.0–15.0)
Immature Granulocytes: 0 %
Lymphocytes Relative: 49 %
Lymphs Abs: 3.4 10*3/uL (ref 0.7–4.0)
MCH: 22.9 pg — ABNORMAL LOW (ref 26.0–34.0)
MCHC: 30.6 g/dL (ref 30.0–36.0)
MCV: 74.9 fL — ABNORMAL LOW (ref 80.0–100.0)
Monocytes Absolute: 0.7 10*3/uL (ref 0.1–1.0)
Monocytes Relative: 10 %
Neutro Abs: 2.7 10*3/uL (ref 1.7–7.7)
Neutrophils Relative %: 38 %
Platelets: 332 10*3/uL (ref 150–400)
RBC: 5.1 MIL/uL (ref 3.87–5.11)
RDW: 14.6 % (ref 11.5–15.5)
WBC: 7.1 10*3/uL (ref 4.0–10.5)
nRBC: 0 % (ref 0.0–0.2)

## 2022-03-11 LAB — COMPLETE METABOLIC PANEL WITH GFR
AG Ratio: 1.7 (calc) (ref 1.0–2.5)
ALT: 11 U/L (ref 6–29)
AST: 15 U/L (ref 10–35)
Albumin: 4.3 g/dL (ref 3.6–5.1)
Alkaline phosphatase (APISO): 173 U/L — ABNORMAL HIGH (ref 37–153)
BUN: 12 mg/dL (ref 7–25)
CO2: 25 mmol/L (ref 20–32)
Calcium: 9.5 mg/dL (ref 8.6–10.4)
Chloride: 106 mmol/L (ref 98–110)
Creat: 0.63 mg/dL (ref 0.60–1.00)
Globulin: 2.6 g/dL (calc) (ref 1.9–3.7)
Glucose, Bld: 101 mg/dL — ABNORMAL HIGH (ref 65–99)
Potassium: 3.6 mmol/L (ref 3.5–5.3)
Sodium: 144 mmol/L (ref 135–146)
Total Bilirubin: 0.3 mg/dL (ref 0.2–1.2)
Total Protein: 6.9 g/dL (ref 6.1–8.1)
eGFR: 94 mL/min/{1.73_m2} (ref >=60)

## 2022-03-11 LAB — LIPID PANEL
Cholesterol: 165 mg/dL (ref <200)
HDL: 50 mg/dL (ref >=50)
LDL Cholesterol (Calc): 91 mg/dL (calc)
Non-HDL Cholesterol (Calc): 115 mg/dL (calc) (ref <130)
Total CHOL/HDL Ratio: 3.3 (calc) (ref <5.0)
Triglycerides: 137 mg/dL (ref <150)

## 2022-03-11 LAB — BASIC METABOLIC PANEL
Anion gap: 10 (ref 5–15)
BUN: 9 mg/dL (ref 8–23)
CO2: 27 mmol/L (ref 22–32)
Calcium: 9.6 mg/dL (ref 8.9–10.3)
Chloride: 105 mmol/L (ref 98–111)
Creatinine, Ser: 0.59 mg/dL (ref 0.44–1.00)
GFR, Estimated: >60 mL/min (ref >60)
Glucose, Bld: 113 mg/dL — ABNORMAL HIGH (ref 70–99)
Potassium: 3.4 mmol/L — ABNORMAL LOW (ref 3.5–5.1)
Sodium: 142 mmol/L (ref 135–145)

## 2022-03-11 LAB — TSH: TSH: 1.8 mIU/L (ref 0.40–4.50)

## 2022-03-11 LAB — VITAMIN D 25 HYDROXY (VIT D DEFICIENCY, FRACTURES): Vit D, 25-Hydroxy: 77 ng/mL (ref 30–100)

## 2022-03-11 MED ORDER — ONDANSETRON HCL 4 MG/2ML IJ SOLN
4.0000 mg | Freq: Once | INTRAMUSCULAR | Status: AC
Start: 2022-03-11 — End: 2022-03-11
  Administered 2022-03-11: 4 mg via INTRAVENOUS
  Filled 2022-03-11: qty 2

## 2022-03-11 MED ORDER — LORAZEPAM 2 MG/ML IJ SOLN: 1.0000 mg | Freq: Once | INTRAMUSCULAR | Status: DC | PRN

## 2022-03-11 MED ORDER — GADOBUTROL 1 MMOL/ML IV SOLN
9.5000 mL | Freq: Once | INTRAVENOUS | Status: AC | PRN
  Administered 2022-03-11: 9.5 mL via INTRAVENOUS

## 2022-03-11 MED ORDER — MORPHINE SULFATE (PF) 4 MG/ML IV SOLN
4.0000 mg | Freq: Once | INTRAVENOUS | Status: AC
  Administered 2022-03-11: 4 mg via INTRAVENOUS
  Filled 2022-03-11: qty 1

## 2022-03-11 NOTE — ED Provider Notes (Signed)
?[facility] EMERGENCY DEPARTMENT ?Provider Note ? ? ?CSN: [id number] ?Arrival date & time: 03/11/22  1342 ? ?  ? ?History ? ?Chief Complaint  ?Patient presents with  ? Fall  ? ? ?Hayley Padilla is a 71 y.o. female with history of culture-negative discitis at L1-L2 who completed 6 weeks of empiric IV daptomycin and ertapenem and is now on oral doxycycline and levofloxacin who presents today with concern for worsening back pain after a fall.  Patient was admitted initially to the hospital for this new infection in February 2023.  She subsequently went to rehab during which time she had multiple falls and according to her daughter who is at the bedside has been quite weak since that time. ?Patient states that she has had multiple falls since being discharged home to her child's house.  At baseline prior to this infection she could ambulate with only a cane to assist her however at this time she has difficulty walking to and from the bathroom or even standing on her own due to her bilateral lower extremity weakness, right greater than left at baseline. ?She states that today she had a "shifting" in her back that caused significant pain when she was walking out of the bathroom and secondary to the pain she fell to the ground.  She denies any head trauma, new numbness, tingling, or weakness in the legs.  She denies any new saddle anesthesia or urinary or fecal incontinence.  ? ?I have personally reviewed this patient's medical records.  In addition to the above listed medical concerns she has history of hypertension and hyperlipidemia.  Her daughter at the bedside is concerned that the family is unable to care for her despite in-home PT, OT, and nursing daily.  They are requesting consultation with social work for discussion for replacement into rehabilitation. ? ?HPI ? ?  ? ?Home Medications ?Prior to Admission medications   ?Medication Sig Start Date End Date Taking? Authorizing Provider  ?amLODipine  (NORVASC) 10 MG tablet Take 10 mg by mouth daily.    [provider]  ?cloNIDine (CATAPRES) 0.1 MG tablet Take 0.1 mg by mouth 2 (two) times daily.    [provider]  ?cyclobenzaprine (FLEXERIL) 10 MG tablet Take 10 mg by mouth 3 (three) times daily as needed for muscle spasms.    [provider]  ?doxycycline (VIBRA-TABS) 100 MG tablet Take 1 tablet (100 mg total) by mouth 2 (two) times daily. 02/25/22   Cliffton Asters, MD  ?furosemide (LASIX) 40 MG tablet Take 40 mg by mouth daily.    [provider]  ?gabapentin (NEURONTIN) 300 MG capsule Take 300 mg by mouth 3 (three) times daily.    [provider]  ?levofloxacin (LEVAQUIN) 500 MG tablet Take 1 tablet (500 mg total) by mouth daily. 02/25/22   Cliffton Asters, MD  ?losartan (COZAAR) 100 MG tablet Take 100 mg by mouth daily.    [provider]  ?lovastatin (MEVACOR) 40 MG tablet Take 1 tablet (40 mg total) by mouth every evening. Hold while on Daptomycin - can resume once course completed. 02/27/22   Briant Cedar, MD  ?oxyCODONE-acetaminophen (PERCOCET) 7.5-325 MG tablet Take 1 tablet by mouth every 6 (six) hours as needed for severe pain. 01/20/22   Briant Cedar, MD  ?polyethylene glycol (MIRALAX / GLYCOLAX) 17 g packet Take 17 g by mouth daily. 01/20/22   Briant Cedar, MD  ?potassium chloride (K-DUR) 10 MEQ tablet Take 10 mEq by mouth  daily.    [provider]  ?senna-docusate (SENOKOT-S) 8.6-50 MG tablet Take 1 tablet by mouth at bedtime. 01/20/22   Briant CedarEzenduka, Nkeiruka J, MD  ?Vitamin D, Ergocalciferol, (DRISDOL) 1.25 MG (50000 UNIT) CAPS capsule Take 50,000 Units by mouth once a week. 02/04/20   [provider]  ?   ? ?Allergies    ?Penicillins and Aspirin   ? ?Review of Systems   ?Review of Systems  ?Constitutional: Negative.   ?HENT: Negative.    ?Eyes: Negative.   ?Respiratory: Negative.    ?Cardiovascular: Negative.   ?Gastrointestinal: Negative.   ?Genitourinary:  Negative.   ?Musculoskeletal:  Positive for back pain.  ?Neurological:  Positive for weakness. Negative for dizziness, seizures, syncope, speech difficulty, light-headedness and headaches.  ? ?Physical Exam ?Updated Vital Signs ?BP 135/81 (BP Location: Right Arm)   Pulse 84   Temp 97.9 ?F (36.6 ?C) (Oral)   Resp 18   Ht 5\' 2"  (1.575 m)   Wt 94.3 kg   SpO2 98%   BMI 38.04 kg/m?  ?Physical Exam ?Vitals and nursing note reviewed.  ?Constitutional:   ?   Appearance: She is not ill-appearing or toxic-appearing.  ?HENT:  ?   Head: Normocephalic and atraumatic.  ?   Nose: Nose normal.  ?   Mouth/Throat:  ?   Mouth: Mucous membranes are moist.  ?   Pharynx: No oropharyngeal exudate or posterior oropharyngeal erythema.  ?Eyes:  ?   General:     ?   Right eye: No discharge.     ?   Left eye: No discharge.  ?   Extraocular Movements: Extraocular movements intact.  ?   Conjunctiva/sclera: Conjunctivae normal.  ?   Pupils: Pupils are equal, round, and reactive to light.  ?Cardiovascular:  ?   Rate and Rhythm: Normal rate and regular rhythm.  ?   Pulses: Normal pulses.  ?   Heart sounds: Normal heart sounds.  ?Pulmonary:  ?   Effort: Pulmonary effort is normal. No respiratory distress.  ?   Breath sounds: Normal breath sounds. No wheezing or rales.  ?Abdominal:  ?   General: Bowel sounds are normal. There is no distension.  ?   Palpations: Abdomen is soft.  ?   Tenderness: There is no abdominal tenderness. There is no right CVA tenderness, left CVA tenderness, guarding or rebound.  ?Musculoskeletal:     ?   General: No deformity.  ?   Cervical back: Normal and neck supple. No bony tenderness.  ?   Thoracic back: Normal. No bony tenderness.  ?   Lumbar back: Bony tenderness present. No lacerations, spasms or tenderness. Normal range of motion.  ?   Right lower leg: No edema.  ?   Left lower leg: No edema.  ?Skin: ?   General: Skin is warm and dry.  ?   Capillary Refill: Capillary refill takes less than 2 seconds.   ?Neurological:  ?   General: No focal deficit present.  ?   Mental Status: She is alert and oriented to person, place, and time. Mental status is at baseline.  ?   GCS: GCS eye subscore is 4. GCS verbal subscore is 5. GCS motor subscore is 6.  ?   Cranial Nerves: Cranial nerves 2-12 are intact.  ?   Sensory: Sensation is intact.  ?   Motor: Weakness present.  ?   Comments: 3/5 strength in plantarflexion on the right versus 5/5 on the left, per patient at her baseline.  Sensation is symmetric.  No saddle anesthesia.  Patient able to raise both legs off of the bed though clearly using more effort for evaluation of right leg. ? ?Ambulation not attempted due to patient's reported weakness  ?Psychiatric:     ?   Mood and Affect: Mood normal.  ? ? ?ED Results / Procedures / Treatments   ?Labs ?(all labs ordered are listed, but only abnormal results are displayed) ?Labs Reviewed - No data to display ? ?EKG ?None ? ?Radiology ?DG Pelvis 1-2 Views ? ?Result Date: 03/11/2022 ?CLINICAL DATA:  Fall EXAM: PELVIS - 1-2 VIEW COMPARISON:  None FINDINGS: Osseous demineralization. Narrowing of the hip joints bilaterally, mild in degree. SI joints preserved. No acute fracture, dislocation, or bone destruction. Prior laminectomy L5 with orthopedic hardware at L3-L4. 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 IMPRESSION: No acute osseous abnormalities. Mild degenerative changes of the hip joints with postsurgical changes lumbar spine. Electronically Signed   By: Ulyses Southward M.D.   On: 03/11/2022 17:22  ? ?CT Lumbar Spine Wo Contrast ? ?Result Date: 03/11/2022 ?CLINICAL DATA:  Larey Seat at physical therapy today. Back pain. History of prior lumbar fusion and history of discitis and osteomyelitis at L1-2. EXAM: CT LUMBAR SPINE WITHOUT CONTRAST TECHNIQUE: Multidetector CT imaging of the lumbar spine was performed without intravenous contrast administration. Multiplanar CT image reconstructions were also generated. RADIATION  DOSE REDUCTION: This exam was performed according to the departmental dose-optimization program which includes automated exposure control, adjustment of the mA and/or kV according to patient size and/or use of iterative reconstr

## 2022-03-11 NOTE — Progress Notes (Signed)
CSW faxed FL2 referral to several area SNFs. ? Awaiting PT consult. ? ?Auth not started. ?Family aware that Pt will be in co-pay days and still wishes to pursue SNF option. ?

## 2022-03-11 NOTE — [place] FL2 (Signed)
?[place] MEDICAID FL2 LEVEL OF CARE SCREENING TOOL  ?  ? ?IDENTIFICATION  ?Patient Name: ?Hayley Padilla Birthdate: [birth year] Sex: female Admission Date (Current Location): ?03/11/2022  ?[place] Number: ? [place] ?  Facility and Address:  ?[facility], [address], [place], [place] [address] ?     Provider Number: ?[id number]  ?Attending Physician Name and Address:  ?Sloan Leiter, DO ? Relative Name and Phone Number:  ?Hayley Padilla, Hayley Padilla Daughter   [phone] ?   ?Current Level of Care: ?Hospital Recommended Level of Care: ?Skilled Nursing Facility Prior Approval Number: ?  ? ?Date Approved/Denied: ?  PASRR Number: ?[id number] A ? ?Discharge Plan: ?SNF ?  ? ?Current Diagnoses: ?Patient Active Problem List  ? Diagnosis Date Noted  ? Leg edema, right 01/15/2022  ? Essential hypertension 01/14/2022  ? Chronic pain 01/14/2022  ? HLD (hyperlipidemia) 01/14/2022  ? Discitis 01/14/2022  ? Obesity (BMI 30-39.9) 01/13/2022  ? S/P lumbar spinal fusion 01/13/2022  ? Discitis of lumbar region 01/13/2022  ? Kyphosis 01/13/2022  ? Degenerative spondylolisthesis 06/12/2019  ? ? ?Orientation RESPIRATION BLADDER Height & Weight   ?  ?Self, Time, Situation, Place ? Normal Incontinent Weight: 208 lb (94.3 kg) ?Height:  5\' 2"  (157.5 cm)  ?BEHAVIORAL SYMPTOMS/MOOD NEUROLOGICAL BOWEL NUTRITION STATUS  ?    Incontinent Diet (heart healthy)  ?AMBULATORY STATUS COMMUNICATION OF NEEDS Skin   ?Extensive Assist Verbally Normal ?  ?  ?  ?    ?     ?     ? ? ?Personal Care Assistance Level of Assistance  ?Bathing, Feeding, Dressing Bathing Assistance: Limited assistance ?Feeding assistance: Independent ?Dressing Assistance: Limited assistance ?   ? ?Functional Limitations Info  ?Sight, Hearing, Speech Sight Info: Adequate ?Hearing Info: Adequate ?Speech Info: Adequate  ? ? ?SPECIAL CARE FACTORS FREQUENCY  ?PT (By licensed PT), OT (By licensed OT)   ?  ?PT Frequency: 5x weekly ?OT Frequency: 5x weekly ?  ?   ?  ?   ? ? ?Contractures Contractures Info: Not present  ? ? ?Additional Factors Info  ?Code Status, Allergies Code Status Info: Full ?Allergies Info: Penicillins, Aspirin ?  ?  ?  ?   ? ?Current Medications (03/11/2022):  This is the current hospital active medication list ?Current Facility-Administered Medications  ?Medication Dose Route Frequency Provider Last Rate Last Admin  ? LORazepam (ATIVAN) injection 1 mg  1 mg Intravenous Once PRN Sponseller, Rebekah R, PA-C      ? ?Current Outpatient Medications  ?Medication Sig Dispense Refill  ? amLODipine (NORVASC) 10 MG tablet Take 10 mg by mouth daily.    ? cloNIDine (CATAPRES) 0.1 MG tablet Take 0.1 mg by mouth 2 (two) times daily.    ? cyclobenzaprine (FLEXERIL) 10 MG tablet Take 10 mg by mouth 3 (three) times daily as needed for muscle spasms.    ? doxycycline (VIBRA-TABS) 100 MG tablet Take 1 tablet (100 mg total) by mouth 2 (two) times daily. 60 tablet 0  ? furosemide (LASIX) 40 MG tablet Take 40 mg by mouth daily.    ? gabapentin (NEURONTIN) 300 MG capsule Take 300 mg by mouth 3 (three) times daily.    ? levofloxacin (LEVAQUIN) 500 MG tablet Take 1 tablet (500 mg total) by mouth daily. 30 tablet 0  ? losartan (COZAAR) 100 MG tablet Take 100 mg by mouth daily.    ? lovastatin (MEVACOR) 40 MG tablet Take 1 tablet (40 mg total) by mouth every  evening. Hold while on Daptomycin - can resume once course completed. 30 tablet 0  ? oxyCODONE-acetaminophen (PERCOCET) 7.5-325 MG tablet Take 1 tablet by mouth every 6 (six) hours as needed for severe pain. 30 tablet 0  ? polyethylene glycol (MIRALAX / GLYCOLAX) 17 g packet Take 17 g by mouth daily. 14 each 0  ? potassium chloride (K-DUR) 10 MEQ tablet Take 10 mEq by mouth daily.    ? senna-docusate (SENOKOT-S) 8.6-50 MG tablet Take 1 tablet by mouth at bedtime.    ? Vitamin D, Ergocalciferol, (DRISDOL) 1.25 MG (50000 UNIT) CAPS capsule Take 50,000 Units by mouth once a week.    ? ? ? ?Discharge Medications: ?Please see  discharge summary for a list of discharge medications. ? ?Relevant Imaging Results: ? ?Relevant Lab Results: ? ? ?Additional Information ?SS#: [id number] has had Covid vaccines. ? ?Hayley Bomba, LCSW ? ? ? ? ?

## 2022-03-11 NOTE — ED Notes (Signed)
Pt at MRI

## 2022-03-11 NOTE — Progress Notes (Signed)
Virtual Visit via Telephone Note ? ?I connected with Clarnce Flock on 03/11/22 at  9:45 AM EDT by telephone and verified that I am speaking with the correct person using two identifiers. ? ?Location: ?Patient: Home ?Provider: [facility] ?  ?I discussed the limitations, risks, security and privacy concerns of performing an evaluation and management service by telephone and the availability of in person appointments. I also discussed with the patient that there may be a patient responsible charge related to this service. The patient expressed understanding and agreed to proceed. ? ? ?History of Present Illness: ?Ms. Tamez completed 6 weeks of empiric IV daptomycin and ertapenem for culture-negative discitis at the L1-2 level on 02/25/2022 for changing to oral doxycycline and levofloxacin.  She has not had any problems tolerating her antibiotics.  She is now back home.  There has been little change in her low back pain.  She says that she is having difficulty walking because of pain and weakness.  She has had several more falls over the past few weeks.  She saw her PCP yesterday and they are working on getting her back in a skilled nursing facility.   ? ?Observations/Objective: ?Sed Rate  ?Date Value  ?02/17/2022 25 mm/h  ?01/15/2022 12 mm/hr  ?01/13/2022 11 mm/h  ? ?CRP (mg/L)  ?Date Value  ?02/17/2022 9.7 (H)  ?01/13/2022 3.5  ?  ? ?Assessment and Plan: ?I talked to Ms. Holford and her daughter, Adair Laundry, about the uncertainty as to whether or not her infection has been cured or is simply being suppressed. ? ?Follow Up Instructions: ?She will continue antibiotics for now and follow-up here for repeat evaluation and lab work on 02/21/2022 ?  ?I discussed the assessment and treatment plan with the patient. The patient was provided an opportunity to ask questions and all were answered. The patient agreed with the plan and demonstrated an understanding of the instructions. ?  ?The patient was advised to call back or seek an  in-person evaluation if the symptoms worsen or if the condition fails to improve as anticipated. ? ?I provided 16 minutes of non-face-to-face time during this encounter. ? ? ?Cliffton Asters, MD ? ?

## 2022-03-11 NOTE — ED Provider Triage Note (Signed)
[facility] Provider Triage Evaluation Note ? ?Hayley Padilla , a 71 y.o. female  was evaluated in triage.  Pt complains of fall.  She states she was at physical therapy today when she was trying to walk and fell backwards onto her bottom.  She says she heard a crack in her lower back.  She denies hitting her head or losing consciousness.  The only pain she has is in her lower back, but she does have chronic pain back there.  She was just concerned about the crack that she heard.  She has not ambulated since the incident.  She does states that she has some bilateral hip pain. No saddle anesthesia or bowel or bladder concern. She is on blood thinners ? ?Review of Systems  ?Positive: Back pain ?Negative:  ? ?Physical Exam  ?BP 135/81 (BP Location: Right Arm)   Pulse 84   Temp 97.9 ?F (36.6 ?C) (Oral)   Resp 18   Ht 5\' 2"  (1.575 m)   Wt 94.3 kg   SpO2 98%   BMI 38.04 kg/m?  ?Gen:   Awake, no distress   ?Resp:  Normal effort  ?MSK:   Moves extremities without difficulty  ?Other:  Some midline lumbar ttp. Patient in wheel chair.  ? ?Medical Decision Making  ?Medically screening exam initiated at 2:20 PM.  Appropriate orders placed.  Hayley Padilla was informed that the remainder of the evaluation will be completed by another provider, this initial triage assessment does not replace that evaluation, and the importance of remaining in the ED until their evaluation is complete. ? ?CT L spine and Hip films ordered ?  ?Adolphus Birchwood, PA-C ?03/11/22 1421 ? ?

## 2022-03-11 NOTE — ED Triage Notes (Addendum)
Pt BIB [facility] from home c/o a fall. Pt has chronic back pain and is in a wheelchair. Pt has been doing some physical therapy and has been able to stand some, was trying to stand today and her legs got weak and gave out. Pt denies hitting her head or LOC. Pt denies taking a blood thinner. Pt is denying any pain at this time.  ? ? ? ?

## 2022-03-11 NOTE — ED Provider Notes (Signed)
23:50: Assumed care from PA Sponseller at shift change pending MRI L-spine and disposition. ? ?Please see prior provider note for full H&P.  Briefly patient is a 71 year old female who presented to the emergency department status post fall at home with increased back pain.  Complicated history in the sense that patient was recently admitted 02/01-02/07 for epidural abscess, transition to skilled nursing facility for full 6 weeks of IV antibiotics, was on daptomycin and ertapenem, transition to oral doxycycline and Levaquin.  Was seen by infectious disease today with plan to continue antibiotics and have repeat labs on the 11th.  Had another fall today, she has been too weak and difficult to care for at home prompting ED visit. ? ?MRI L spine wwo contrast:  ?IMPRESSION: ?1. Persistent changes of osteomyelitis discitis at L1-2 with similar ?associated left ventral epidural phlegmon, and probable new small ?posterior component as above. 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 ?2. No other evidence for new or distant infection within the lumbar ?spine. ?3. No MRI evidence for fracture or other acute traumatic injury ?status post recent fall. ?4. Stable postsurgical changes at L2-3 through L5-S1. No new or ?progressive stenosis. ? ? ?Given worsening infection will discuss with hospital service for admission, holding off on antibiotics and may defer to infectious disease.  ? ?Discussed with hospitalist Dr. Loney Loh who accepts admission. ? ?Discussed with attending Dr. Bebe Shaggy who is in agreement. ?  Cherly Anderson, PA-C ?03/12/22 2774 ? ?  ?Zadie Rhine, MD ?03/12/22 0800 ? ?

## 2022-03-12 DIAGNOSIS — E876 Hypokalemia: Secondary | ICD-10-CM

## 2022-03-12 DIAGNOSIS — M4620 Osteomyelitis of vertebra, site unspecified: Secondary | ICD-10-CM

## 2022-03-12 LAB — CBC WITH DIFFERENTIAL/PLATELET
Abs Immature Granulocytes: 0.02 10*3/uL (ref 0.00–0.07)
Basophils Absolute: 0 10*3/uL (ref 0.0–0.1)
Basophils Relative: 0 %
Eosinophils Absolute: 0.2 10*3/uL (ref 0.0–0.5)
Eosinophils Relative: 3 %
HCT: 32.5 % — ABNORMAL LOW (ref 36.0–46.0)
Hemoglobin: 10.4 g/dL — ABNORMAL LOW (ref 12.0–15.0)
Immature Granulocytes: 0 %
Lymphocytes Relative: 38 %
Lymphs Abs: 2.4 10*3/uL (ref 0.7–4.0)
MCH: 23.4 pg — ABNORMAL LOW (ref 26.0–34.0)
MCHC: 32 g/dL (ref 30.0–36.0)
MCV: 73 fL — ABNORMAL LOW (ref 80.0–100.0)
Monocytes Absolute: 0.7 10*3/uL (ref 0.1–1.0)
Monocytes Relative: 11 %
Neutro Abs: 3 10*3/uL (ref 1.7–7.7)
Neutrophils Relative %: 48 %
Platelets: 296 10*3/uL (ref 150–400)
RBC: 4.45 MIL/uL (ref 3.87–5.11)
RDW: 14.6 % (ref 11.5–15.5)
WBC: 6.3 10*3/uL (ref 4.0–10.5)
nRBC: 0 % (ref 0.0–0.2)

## 2022-03-12 LAB — C-REACTIVE PROTEIN: CRP: 0.6 mg/dL (ref <1.0)

## 2022-03-12 LAB — COMPREHENSIVE METABOLIC PANEL
ALT: 14 U/L (ref 0–44)
AST: 16 U/L (ref 15–41)
Albumin: 3.3 g/dL — ABNORMAL LOW (ref 3.5–5.0)
Alkaline Phosphatase: 133 U/L — ABNORMAL HIGH (ref 38–126)
Anion gap: 8 (ref 5–15)
BUN: 10 mg/dL (ref 8–23)
CO2: 27 mmol/L (ref 22–32)
Calcium: 9.2 mg/dL (ref 8.9–10.3)
Chloride: 107 mmol/L (ref 98–111)
Creatinine, Ser: 0.66 mg/dL (ref 0.44–1.00)
GFR, Estimated: >60 mL/min (ref >60)
Glucose, Bld: 126 mg/dL — ABNORMAL HIGH (ref 70–99)
Potassium: 3.7 mmol/L (ref 3.5–5.1)
Sodium: 142 mmol/L (ref 135–145)
Total Bilirubin: 0.6 mg/dL (ref 0.3–1.2)
Total Protein: 6 g/dL — ABNORMAL LOW (ref 6.5–8.1)

## 2022-03-12 LAB — MAGNESIUM: Magnesium: 2 mg/dL (ref 1.7–2.4)

## 2022-03-12 LAB — SEDIMENTATION RATE: Sed Rate: 33 mm/hr — ABNORMAL HIGH (ref 0–22)

## 2022-03-12 MED ORDER — AMLODIPINE BESYLATE 10 MG PO TABS
10.0000 mg | ORAL_TABLET | Freq: Every day | ORAL | Status: DC
  Administered 2022-03-12 – 2022-03-18 (×7): 10 mg via ORAL
  Filled 2022-03-12 (×4): qty 1
  Filled 2022-03-12: qty 2
  Filled 2022-03-12 (×2): qty 1

## 2022-03-12 MED ORDER — LEVOFLOXACIN 500 MG PO TABS
500.0000 mg | ORAL_TABLET | Freq: Every day | ORAL | Status: DC
  Filled 2022-03-12: qty 1

## 2022-03-12 MED ORDER — LEVOFLOXACIN 500 MG PO TABS
500.0000 mg | ORAL_TABLET | Freq: Every day | ORAL | Status: DC
  Administered 2022-03-12 – 2022-03-13 (×2): 500 mg via ORAL
  Filled 2022-03-12 (×2): qty 1

## 2022-03-12 MED ORDER — POTASSIUM CHLORIDE CRYS ER 20 MEQ PO TBCR
40.0000 meq | EXTENDED_RELEASE_TABLET | Freq: Once | ORAL | Status: AC
  Administered 2022-03-12: 40 meq via ORAL
  Filled 2022-03-12: qty 2

## 2022-03-12 MED ORDER — FUROSEMIDE 20 MG PO TABS: 40.0000 mg | ORAL_TABLET | Freq: Every day | ORAL | Status: DC

## 2022-03-12 MED ORDER — OXYCODONE-ACETAMINOPHEN 5-325 MG PO TABS
1.0000 | ORAL_TABLET | Freq: Four times a day (QID) | ORAL | Status: DC | PRN
  Administered 2022-03-13 – 2022-03-15 (×6): 1 via ORAL
  Filled 2022-03-12 (×6): qty 1

## 2022-03-12 MED ORDER — OXYCODONE-ACETAMINOPHEN 7.5-325 MG PO TABS
1.0000 | ORAL_TABLET | Freq: Four times a day (QID) | ORAL | Status: DC | PRN
Start: 2022-03-12 — End: 2022-03-12

## 2022-03-12 MED ORDER — LOSARTAN POTASSIUM 50 MG PO TABS
100.0000 mg | ORAL_TABLET | Freq: Every day | ORAL | Status: DC
  Administered 2022-03-12 – 2022-03-18 (×7): 100 mg via ORAL
  Filled 2022-03-12 (×7): qty 2

## 2022-03-12 MED ORDER — CLONIDINE HCL 0.1 MG PO TABS
0.1000 mg | ORAL_TABLET | Freq: Two times a day (BID) | ORAL | Status: DC
  Administered 2022-03-12: 0.1 mg via ORAL
  Filled 2022-03-12: qty 1

## 2022-03-12 MED ORDER — AMLODIPINE BESYLATE 5 MG PO TABS
10.0000 mg | ORAL_TABLET | Freq: Once | ORAL | Status: AC
  Administered 2022-03-12: 10 mg via ORAL
  Filled 2022-03-12: qty 2

## 2022-03-12 MED ORDER — PRAVASTATIN SODIUM 40 MG PO TABS: 40.0000 mg | ORAL_TABLET | Freq: Every day | ORAL | Status: DC

## 2022-03-12 MED ORDER — CLONIDINE HCL 0.1 MG PO TABS
0.1000 mg | ORAL_TABLET | Freq: Two times a day (BID) | ORAL | Status: DC
Start: 2022-03-12 — End: 2022-03-18
  Administered 2022-03-12 – 2022-03-18 (×13): 0.1 mg via ORAL
  Filled 2022-03-12 (×13): qty 1

## 2022-03-12 MED ORDER — HEPARIN SODIUM (PORCINE) 5000 UNIT/ML IJ SOLN
5000.0000 [IU] | Freq: Three times a day (TID) | INTRAMUSCULAR | Status: AC
  Administered 2022-03-12 – 2022-03-15 (×11): 5000 [IU] via SUBCUTANEOUS
  Filled 2022-03-12 (×12): qty 1

## 2022-03-12 MED ORDER — LOSARTAN POTASSIUM 50 MG PO TABS: 100.0000 mg | ORAL_TABLET | Freq: Every day | ORAL | Status: DC

## 2022-03-12 MED ORDER — ACETAMINOPHEN 325 MG PO TABS: 650.0000 mg | ORAL_TABLET | Freq: Four times a day (QID) | ORAL | Status: DC | PRN

## 2022-03-12 MED ORDER — MORPHINE SULFATE (PF) 2 MG/ML IV SOLN: 1.0000 mg | INTRAVENOUS | Status: DC | PRN

## 2022-03-12 MED ORDER — ACETAMINOPHEN 650 MG RE SUPP: 650.0000 mg | Freq: Four times a day (QID) | RECTAL | Status: DC | PRN

## 2022-03-12 MED ORDER — GABAPENTIN 300 MG PO CAPS: 300.0000 mg | ORAL_CAPSULE | Freq: Three times a day (TID) | ORAL | Status: DC

## 2022-03-12 MED ORDER — DOXYCYCLINE HYCLATE 100 MG PO TABS
100.0000 mg | ORAL_TABLET | Freq: Two times a day (BID) | ORAL | Status: DC
  Administered 2022-03-12: 100 mg via ORAL
  Filled 2022-03-12: qty 1

## 2022-03-12 MED ORDER — SENNOSIDES-DOCUSATE SODIUM 8.6-50 MG PO TABS: 1.0000 | ORAL_TABLET | Freq: Every evening | ORAL | Status: DC | PRN

## 2022-03-12 MED ORDER — DOXYCYCLINE HYCLATE 100 MG PO TABS
100.0000 mg | ORAL_TABLET | Freq: Two times a day (BID) | ORAL | Status: DC
  Administered 2022-03-12 – 2022-03-13 (×3): 100 mg via ORAL
  Filled 2022-03-12 (×3): qty 1

## 2022-03-12 MED ORDER — FUROSEMIDE 40 MG PO TABS
40.0000 mg | ORAL_TABLET | Freq: Every day | ORAL | Status: DC
  Administered 2022-03-12 – 2022-03-18 (×7): 40 mg via ORAL
  Filled 2022-03-12 (×3): qty 1
  Filled 2022-03-12: qty 2
  Filled 2022-03-12 (×3): qty 1

## 2022-03-12 MED ORDER — GABAPENTIN 300 MG PO CAPS
300.0000 mg | ORAL_CAPSULE | Freq: Three times a day (TID) | ORAL | Status: DC
  Administered 2022-03-12 – 2022-03-18 (×18): 300 mg via ORAL
  Filled 2022-03-12 (×18): qty 1

## 2022-03-12 MED ORDER — PRAVASTATIN SODIUM 40 MG PO TABS
40.0000 mg | ORAL_TABLET | Freq: Every day | ORAL | Status: DC
  Administered 2022-03-12 – 2022-03-17 (×5): 40 mg via ORAL
  Filled 2022-03-12 (×5): qty 1

## 2022-03-12 MED ORDER — CYCLOBENZAPRINE HCL 10 MG PO TABS
10.0000 mg | ORAL_TABLET | Freq: Three times a day (TID) | ORAL | Status: DC | PRN
  Administered 2022-03-12 – 2022-03-17 (×7): 10 mg via ORAL
  Filled 2022-03-12 (×7): qty 1

## 2022-03-12 MED ORDER — OXYCODONE HCL 5 MG PO TABS
2.5000 mg | ORAL_TABLET | Freq: Four times a day (QID) | ORAL | Status: DC | PRN
  Administered 2022-03-15: 2.5 mg via ORAL
  Filled 2022-03-12: qty 1

## 2022-03-12 NOTE — ED Notes (Signed)
Pt assisted to BR in w/c. Pt initially very weak and hard to get up but improved after initial standing/pivoting. RN obtained recliner for pt. Pt sitting up in recliner at bedside watching TV. Denies needs. Call light in reach. ?

## 2022-03-12 NOTE — Assessment & Plan Note (Signed)
Continue statin. 

## 2022-03-12 NOTE — Assessment & Plan Note (Signed)
-  Admitted last month for back pain and progressive numbness and weakness of her right leg.  MRI revealed discitis-osteomyelitis at L1-2 and epidural phlegmon/abscess.  Status post IR disc aspiration on 2/2, culture negative.  Treated with 6 weeks of IV daptomycin and ertapenem, switch to oral doxycycline and Levaquin on 3/15. ?-Patient has continued to have back pain, difficulty walking, and multiple falls over the past few weeks. ?-MRI of lumbar spine showing persistent changes of osteomyelitis discitis at L1-2 with similar associated left ventral epidural phlegmon and probable new small posterior component.  Resultant moderate to severe spinal stenosis is stable to perhaps slightly worsened from prior.  No other evidence of new or distant infection within the lumbar spine.  No MRI evidence of fracture or other acute traumatic injury.  Stable postsurgical changes at L2-3 through L5-S1.  No new or progressive stenosis. ?-No fever or leukocytosis. ?-Continue home gabapentin, Flexeril as needed, Percocet as needed.  Patient feels her pain is well controlled with Percocet and does not want any IV pain medications. ?-Continue oral doxycycline and Levaquin at this time. ?-Consult ID in the morning. ?-PT/OT eval, TOC consulted for SNF placement ?

## 2022-03-12 NOTE — H&P (Signed)
?History and Physical  ? ? ?JAZIAH HENINGER [id number] DOB: Mar 04, 1951 DOA: 03/11/2022 ? ?PCP: Nolene Ebbs, MD ? ?Patient coming from: Home ? ?Chief Complaint: Back pain ? ?HPI: Hayley Padilla is a 71 y.o. female with medical history significant of hypertension, hyperlipidemia, obesity (BMI 38.04), history of degenerative disc disease, posterior spinal fusion at L4-5 in 2011 and history of revision of fusion at L2-4.  Admitted last month 2/1-2/7 for back pain and progressive numbness and weakness of her right leg.  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  Status post IR disc aspiration on 2/2, culture negative.  Infectious disease was consulted, PICC line placed, and discharged to SNF on IV daptomycin and ertapenem for total duration of 6 weeks of IV antibiotics.  Switched to oral doxycycline and Levaquin by infectious disease on 3/15 and PICC removed.  Seen by ID again yesterday for continued back pain, difficulty walking, and multiple falls over the past few weeks.  Advised to continue oral antibiotics and follow-up for repeat evaluation and lab work next month.  Patient subsequently presented to the ED complaining of worsening back pain after a fall.  No fever or leukocytosis.  Hemoglobin 11.7, stable.  Potassium 3.4.  MRI of lumbar spine showing persistent changes of osteomyelitis discitis at L1-2 with similar associated left ventral epidural phlegmon and probable new small posterior component.  Resultant moderate to severe spinal stenosis is stable to perhaps slightly worsened from prior.  No other evidence of new or distant infection within the lumbar spine.  No MRI evidence of fracture or other acute traumatic injury.  Stable postsurgical changes at L2-3 through L5-S1.  No new or progressive stenosis. ? ?Patient states  she has continued to have low back pain for the past 2 years after she had back surgery.  For the past 6 months or longer she is experiencing right leg weakness and slight numbness.  States she was admitted for infection in her back last month and discharged to a rehab facility.  She returned home from rehab on 3/18 and since then her back pain has worsened and she has had multiple falls.  Denies saddle anesthesia or bladder dysfunction.  Reports chronic constipation for which she takes a laxative.  Denies head injury or loss of consciousness from any of these falls.  She finished IV antibiotics on 3/15 and currently taking doxycycline and Levaquin, reports compliance.  States he was seen by Dr. Megan Salon yesterday and was advised to continue taking her oral antibiotics and return for a follow-up visit on April 11.  Denies fevers, cough, shortness of breath, chest pain, nausea, vomiting, abdominal pain. ? ?Review of Systems:  ?Review of Systems  ?All other systems reviewed and are negative. ? ?Past Medical History:  ?Diagnosis Date  ? Allergy   ? Arthritis   ? Hyperlipidemia   ? Hypertension   ? PONV (postoperative nausea and vomiting)   ? ? ?Past Surgical History:  ?Procedure Laterality Date  ? BACK SURGERY    ? lower back  ? IR LUMBAR DISC ASPIRATION W/IMG GUIDE  01/15/2022  ? MULTIPLE TOOTH EXTRACTIONS    ? ? ? reports that she has never smoked. Her smokeless tobacco use includes chew. She reports that she does not currently use alcohol. She reports that she does not use drugs. ? ?Allergies  ?Allergen  Reactions  ? Penicillins Anaphylaxis  ?  Did it involve swelling of the face/tongue/throat, SOB, or low BP? Yes ?Did it involve sudden or severe rash/hives, skin peeling, or any reaction on the inside of your mouth or nose? No ?Did you need to seek medical attention at a hospital or doctor's office? Yes ?When did it last happen?      "Been a While"  ?If all above answers are ?NO?, may proceed with cephalosporin use. ?   ? Aspirin Other (See Comments)  ?  brusing _ patient requested to be listed  ? ? ?Family History  ?Problem Relation Age of Onset  ? Colon cancer Neg Hx   ? Colon polyps Neg Hx   ? Esophageal cancer Neg Hx   ? Rectal cancer Neg Hx   ? Stomach cancer Neg Hx   ? ? ?Prior to Admission medications   ?Medication Sig Start Date End Date Taking? Authorizing Provider  ?amLODipine (NORVASC) 10 MG tablet Take 10 mg by mouth daily.   Yes [provider]  ?cloNIDine (CATAPRES) 0.1 MG tablet Take 0.1 mg by mouth 2 (two) times daily.   Yes [provider]  ?cyclobenzaprine (FLEXERIL) 10 MG tablet Take 10 mg by mouth 3 (three) times daily as needed for muscle spasms.   Yes [provider]  ?doxycycline (VIBRA-TABS) 100 MG tablet Take 1 tablet (100 mg total) by mouth 2 (two) times daily. ?Patient taking differently: Take 100 mg by mouth See admin instructions. Bid x 30 days 02/25/22  Yes Michel Bickers, MD  ?furosemide (LASIX) 40 MG tablet Take 40 mg by mouth daily.   Yes [provider]  ?gabapentin (NEURONTIN) 300 MG capsule Take 300 mg by mouth 3 (three) times daily.   Yes [provider]  ?levofloxacin (LEVAQUIN) 500 MG tablet Take 1 tablet (500 mg total) by mouth daily. ?Patient taking differently: Take 500 mg by mouth See admin instructions. Qd x 30 days 02/25/22  Yes Michel Bickers, MD  ?losartan (COZAAR) 100 MG tablet Take 100 mg by mouth daily.   Yes [provider]  ?lovastatin (MEVACOR) 40 MG tablet Take 1 tablet (40 mg total) by mouth every evening. Hold while on Daptomycin - can resume once course completed. ?Patient taking differently: Take 40 mg by mouth every evening. 02/27/22  Yes Alma Friendly, MD  ?oxyCODONE-acetaminophen (PERCOCET) 7.5-325 MG tablet Take 1 tablet by mouth every 6 (six) hours as needed for severe pain. 01/20/22  Yes Alma Friendly, MD  ?polyethylene glycol (MIRALAX / GLYCOLAX) 17 g packet Take 17 g by mouth daily. ?Patient taking  differently: Take 17 g by mouth daily as needed for mild constipation. 01/20/22  Yes Alma Friendly, MD  ?potassium chloride (K-DUR) 10 MEQ tablet Take 10 mEq by mouth daily.   Yes [provider]  ?senna-docusate (SENOKOT-S) 8.6-50 MG tablet Take 1 tablet by mouth at bedtime. 01/20/22  Yes Alma Friendly, MD  ?Vitamin D, Ergocalciferol, (DRISDOL) 1.25 MG (50000 UNIT) CAPS capsule Take 50,000 Units by mouth every Monday. 02/04/20  Yes [provider]  ? ? ?Physical Exam: ?Vitals:  ? 03/11/22 2117 03/11/22 2330 03/12/22 0203 03/12/22 0300  ?BP: 131/75 (!) 137/111 (!) 169/99 110/64  ?Pulse: 96 (!) 109 63 93  ?Resp: 18 18 20 20   ?Temp:      ?TempSrc:      ?SpO2: 100% 99% 100% 96%  ?Weight:      ?Height:      ? ? ?Physical  Exam ?Constitutional:   ?   General: She is not in acute distress. ?HENT:  ?   Head: Normocephalic and atraumatic.  ?Eyes:  ?   Extraocular Movements: Extraocular movements intact.  ?   Conjunctiva/sclera: Conjunctivae normal.  ?Cardiovascular:  ?   Rate and Rhythm: Normal rate and regular rhythm.  ?   Pulses: Normal pulses.  ?Pulmonary:  ?   Effort: Pulmonary effort is normal. No respiratory distress.  ?   Breath sounds: Normal breath sounds. No wheezing or rales.  ?Abdominal:  ?   General: Bowel sounds are normal. There is no distension.  ?   Palpations: Abdomen is soft.  ?   Tenderness: There is no abdominal tenderness.  ?Musculoskeletal:  ?   Cervical back: Normal range of motion and neck supple.  ?   Comments: Mild edema of the right lower extremity  ?Skin: ?   General: Skin is warm and dry.  ?Neurological:  ?   Mental Status: She is alert and oriented to person, place, and time.  ?   Comments: Strength 5 out of 5 in the left lower extremity and 4 out of 5 in the right lower extremity.  Sensation to light touch intact.  ?  ? ?Labs on Admission: I have personally reviewed following labs and imaging studies ? ?CBC: ?Recent Labs  ?Lab 03/10/22 ?1100 03/11/22 ?1836  ?WBC 5.8  7.1  ?NEUTROABS  --  2.7  ?HGB 11.6* 11.7*  ?HCT 37.4 38.2  ?MCV 75.3* 74.9*  ?PLT 325 332  ? ?Basic Metabolic Panel: ?Recent Labs  ?Lab 03/10/22 ?1100 03/11/22 ?1836  ?NA 144 142  ?K 3.6 3.4*  ?CL 106 105

## 2022-03-12 NOTE — Evaluation (Addendum)
Occupational Therapy Evaluation ?Patient Details ?Name: Hayley Padilla ?MRN: [id number] ?DOB: 08/21/1951 ?Today's Date: 03/12/2022 ? ? ?History of Present Illness 71 y.o. female presents [facility] on 3/[facility] with back pain after a mechanical fall at home. 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 PMH includes recent hospital stay (February 2023), HTN, HLD, DDD, posterior spinal fusion at the L4-5 level in 2011.  ? ?Clinical Impression ?  ?PTA patient reports completing ADLS with independence at home, using rollator and wc for mobility around home (reports using WC more frequently prior to admission).  She reports living at home with daughter, several falls due to pain. Admitted for above and presents with pain, impaired balance, weakness and decreased activity tolerance. She requires +2 min assist for limited mobility in room using RW, min guard for transfer sit to stand but cueing for hand placement and safety, setup for UB ADLs seated but up to max assist for LB Adls.  She will benefit from continued OT services while admitted and after dc at SNF level to optimize independence, safety and return to PLOF.   ?   ? ?Recommendations for follow up therapy are one component of a multi-disciplinary discharge planning process, led by the attending physician.  Recommendations may be updated based on patient status, additional functional criteria and insurance authorization.  ? ?Follow Up Recommendations ? Skilled nursing-short term rehab (<3 hours/day)  ?  ?Assistance Recommended at Discharge Frequent or constant Supervision/Assistance  ?Patient can return home with the following A little help with walking and/or transfers;A little help with bathing/dressing/bathroom;Assistance with cooking/housework;Assist for transportation;Help with stairs or ramp for entrance ? ?  ?Functional Status Assessment ? Patient has had a recent  decline in their functional status and demonstrates the ability to make significant improvements in function in a reasonable and predictable amount of time.  ?Equipment Recommendations ? Other (comment) (defer)  ?  ?Recommendations for Other Services   ? ? ?  ?Precautions / Restrictions Precautions ?Precautions: Fall;Back ?Precaution Booklet Issued: No ?Precaution Comments: for comfort ?Restrictions ?Weight Bearing Restrictions: No  ? ?  ? ?Mobility Bed Mobility ?  ?  ?  ?  ?  ?  ?  ?General bed mobility comments: OOB in recliner upon entry ?  ? ?Transfers ?  ?  ?  ?  ?  ?  ?  ?  ?  ?  ?  ? ?  ?Balance Overall balance assessment: Needs assistance ?Sitting-balance support: No upper extremity supported, Feet supported ?Sitting balance-Leahy Scale: Fair ?  ?  ?Standing balance support: Bilateral upper extremity supported, During functional activity, Single extremity supported ?Standing balance-Leahy Scale: Poor ?Standing balance comment: relies on BUE support dynamically, able to engage in ADLs with 1 UE support ?  ?  ?  ?  ?  ?  ?  ?  ?  ?  ?  ?   ? ?ADL either performed or assessed with clinical judgement  ? ?ADL Overall ADL's : Needs assistance/impaired ?  ?  ?Grooming: Set up;Sitting ?  ?  ?  ?  ?  ?Upper Body Dressing : Set up;Sitting ?  ?Lower Body Dressing: Maximal assistance;Sit to/from stand ?  ?Toilet Transfer: Minimal assistance;+2 for physical assistance;+2 for safety/equipment;Ambulation ?  ?Toileting- Clothing Manipulation and Hygiene: Minimal assistance;Sit to/from stand ?  ?  ?  ?Functional mobility during ADLs: Minimal assistance;Rolling walker (2 wheels);+2 for physical assistance;+2 for safety/equipment ?General  ADL Comments: min guard sit to stand, but increased assist functionally up to min assist +2 using RW  ? ? ? ?Vision   ?Vision Assessment?: No apparent visual deficits  ?   ?Perception   ?  ?Praxis   ?  ? ?Pertinent Vitals/Pain Pain Assessment ?Pain Assessment: Faces ?Faces Pain Scale: Hurts  little more ?Pain Location: back, R LE ?Pain Descriptors / Indicators: Discomfort, Grimacing, Guarding ?Pain Intervention(s): Limited activity within patient's tolerance, Monitored during session, Repositioned  ? ? ? ?Hand Dominance Right ?  ?Extremity/Trunk Assessment Upper Extremity Assessment ?Upper Extremity Assessment: LUE deficits/detail;Generalized weakness ?LUE Deficits / Details: decreased shoulder ROM to 60* ?LUE Coordination: decreased gross motor ?  ?Lower Extremity Assessment ?Lower Extremity Assessment: Defer to PT evaluation ?  ?Cervical / Trunk Assessment ?Cervical / Trunk Assessment: Other exceptions ?Cervical / Trunk Exceptions: hx of back surgery, osteomyelitis ?  ?Communication Communication ?Communication: No difficulties ?  ?Cognition Arousal/Alertness: Awake/alert ?Behavior During Therapy: [facility] for tasks assessed/performed ?Overall Cognitive Status: Within Functional Limits for tasks assessed ?  ?  ?  ?  ?  ?  ?  ?  ?  ?  ?  ?  ?  ?  ?  ?  ?  ?  ?  ?General Comments  VSS ? ?  ?Exercises   ?  ?Shoulder Instructions    ? ? ?Home Living Family/patient expects to be discharged to:: Private residence ?Living Arrangements: Children;Other (Comment) (grandkids) ?Available Help at Discharge: Family;Available PRN/intermittently (most of the time, daughter works from home) ?Type of Home: House ?Home Access: Level entry ?  ?  ?Home Layout: Two level;Bed/bath upstairs;1/2 bath on main level ?Alternate Level Stairs-Number of Steps: 13 ?Alternate Level Stairs-Rails: Left ?Bathroom Shower/Tub: Tub/shower unit ?  ?Bathroom Toilet: Standard ?  ?  ?Home Equipment: Rollator (4 wheels);Shower seat;Cane - single point;Wheelchair - Psychologist, educational ?Adaptive Equipment: Sock aid ?  ?  ? ?  ?Prior Functioning/Environment Prior Level of Function : Independent/Modified Independent;History of Falls (last six months) ?  ?  ?  ?  ?  ?  ?Mobility Comments: uses rollator for mobility, reports "crawling" up  stairs ?ADLs Comments: reports independent with ADLS, using sock aide for LBdressing ?  ? ?  ?  ?OT Problem List: Decreased strength;Decreased activity tolerance;Impaired balance (sitting and/or standing);Pain;Obesity;Decreased knowledge of use of DME or AE;Decreased safety awareness ?  ?   ?OT Treatment/Interventions: Self-care/ADL training;Therapeutic exercise;DME and/or AE instruction;Therapeutic activities;Patient/family education;Balance training  ?  ?OT Goals(Current goals can be found in the care plan section) Acute Rehab OT Goals ?Patient Stated Goal: get stronger before going home ?OT Goal Formulation: With patient ?Time For Goal Achievement: 03/26/22 ?Potential to Achieve Goals: Good  ?OT Frequency: Min 2X/week ?  ? ?Co-evaluation PT/OT/SLP Co-Evaluation/Treatment: Yes ?Reason for Co-Treatment: For patient/therapist safety;To address functional/ADL transfers ?  ?OT goals addressed during session: ADL's and self-care ?  ? ?  ?AM-PAC OT "6 Clicks" Daily Activity     ?Outcome Measure Help from another person eating meals?: None ?Help from another person taking care of personal grooming?: A Little ?Help from another person toileting, which includes using toliet, bedpan, or urinal?: A Little ?Help from another person bathing (including washing, rinsing, drying)?: A Lot ?Help from another person to put on and taking off regular upper body clothing?: A Little ?Help from another person to put on and taking off regular lower body clothing?: A Lot ?6 Click Score: 17 ?  ?End of Session Equipment Utilized During Treatment: Gait  belt;Rolling walker (2 wheels) ?Nurse Communication: Mobility status ? ?Activity Tolerance: Patient tolerated treatment well ?Patient left: in chair;with call bell/phone within reach ? ?OT Visit Diagnosis: Other abnormalities of gait and mobility (R26.89);Muscle weakness (generalized) (M62.81);Pain ?Pain - Right/Left: Right ?Pain - part of body: Leg (back)  ?              ?Time: [id number] ?OT  Time Calculation (min): 19 min ?Charges:  OT General Charges ?$OT Visit: 1 Visit ?OT Evaluation ?$OT Eval Moderate Complexity: 1 Mod ? ?Jolaine Artist, OT ?Acute Rehabilitation Services ?Pager [phone] ?Office 33

## 2022-03-12 NOTE — Evaluation (Signed)
Physical Therapy Evaluation ?Patient Details ?Name: Hayley Padilla ?MRN: [id number] ?DOB: 05/01/1951 ?Today's Date: 03/12/2022 ? ?History of Present Illness ? 71 y.o. female presents to [facility] on 3/29 with back pain after a mechanical fall at home. 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 PMH includes recent hospital stay (February 2023), HTN, HLD, DDD, posterior spinal fusion at the L4-5 level in 2011.  ?Clinical Impression ? Patient presented to the ED on 03/11/22 with back pain after a fall at home consistent with patient's diagnosis of osteomyelitis in L1-L2. Pt's impairments include decreased strength, power, balance, activity tolerance, and increased pain. These impairments are limiting her ability to safely and independently transfer, ambulate, and navigate stairs. Patient requires min guard to min Ax2 with all functional mobility with RW. Pt did experience R knee buckling with forward and backward steps. SPT recommending SNF upon D/C due to mobility deficits. PT will continue to follow acutely to maximize pt's safety and independence with functional mobility. ?   ?   ? ?Recommendations for follow up therapy are one component of a multi-disciplinary discharge planning process, led by the attending physician.  Recommendations may be updated based on patient status, additional functional criteria and insurance authorization. ? ?Follow Up Recommendations Skilled nursing-short term rehab (<3 hours/day) ? ?  ?Assistance Recommended at Discharge Frequent or constant Supervision/Assistance  ?Patient can return home with the following ? Two people to help with walking and/or transfers;A little help with bathing/dressing/bathroom;Assistance with cooking/housework;Assist for transportation;Help with stairs or ramp for entrance ? ?  ?Equipment Recommendations None recommended by PT  ?Recommendations for Other  Services ?    ?  ?Functional Status Assessment Patient has had a recent decline in their functional status and demonstrates the ability to make significant improvements in function in a reasonable and predictable amount of time.  ? ?  ?Precautions / Restrictions Precautions ?Precautions: Fall;Back ?Precaution Booklet Issued: No ?Precaution Comments: for comfort ?Restrictions ?Weight Bearing Restrictions: No  ? ?  ? ?Mobility ? Bed Mobility ?  ?  ?  ?  ?  ?  ?  ?General bed mobility comments: OOB in recliner upon entry ?  ? ?Transfers ?Overall transfer level: Needs assistance ?Equipment used: Rolling walker (2 wheels) ?Transfers: Sit to/from Stand ?Sit to Stand: Min guard, +2 safety/equipment ?  ?  ?  ?  ?  ?General transfer comment: pt required min guard for safety and performed very slowly and cautiously with RW. Pt required cues for hand placement to stand and needed cues to stand tall instead of with forward flexed trunk. Pt c/o back pain standing more erect. All of pt's weight on LLE with limited to no weightbearing through RLE due to pain. ?  ? ?Ambulation/Gait ?Ambulation/Gait assistance: Min assist, +2 physical assistance, +2 safety/equipment ?Gait Distance (Feet): 2 Feet ?Assistive device: Rolling walker (2 wheels) ?Gait Pattern/deviations: Shuffle, Decreased stride length, Knees buckling, Trunk flexed, Narrow base of support ?Gait velocity: decreased ?Gait velocity interpretation: <1.31 ft/sec, indicative of household ambulator ?  ?General Gait Details: pt required min Ax2 for forward and backward steps in room with shuffling feet, R knee buckling, and increased forward trunk flexion. Pt unable to weightshift to RLE to bring L foot off the ground for swing phase. Pt required max cues to transfer back to recliner safely. ? ?Stairs ?  ?  ?  ?  ?  ? ?Wheelchair Mobility ?  ? ?Modified Rankin (  Stroke Patients Only) ?  ? ?  ? ?Balance Overall balance assessment: Needs assistance ?Sitting-balance support: No  upper extremity supported, Feet supported ?Sitting balance-Leahy Scale: Fair ?Sitting balance - Comments: pt able to sit without UE support ?  ?Standing balance support: Bilateral upper extremity supported, During functional activity, Single extremity supported ?Standing balance-Leahy Scale: Poor ?Standing balance comment: relies on BUE support dynamically, able to engage in ADLs with 1 UE support ?  ?  ?  ?  ?  ?  ?  ?  ?  ?  ?  ?   ? ? ? ?Pertinent Vitals/Pain Pain Assessment ?Pain Assessment: Faces ?Faces Pain Scale: Hurts little more ?Pain Location: back, R LE ?Pain Descriptors / Indicators: Discomfort, Grimacing, Guarding ?Pain Intervention(s): Limited activity within patient's tolerance  ? ? ?Home Living Family/patient expects to be discharged to:: Private residence ?Living Arrangements: Children;Other (Comment) (grandkids) ?Available Help at Discharge: Family;Available PRN/intermittently (most of the time; daughter works from home) ?Type of Home: House ?Home Access: Level entry ?  ?  ?Alternate Level Stairs-Number of Steps: 13 ?Home Layout: Two level;Bed/bath upstairs;1/2 bath on main level ?Home Equipment: Rollator (4 wheels);Shower seat;Cane - single point;Wheelchair - Building surveyor ?   ?  ?Prior Function Prior Level of Function : Independent/Modified Independent;History of Falls (last six months) ?  ?  ?  ?  ?  ?  ?Mobility Comments: uses rollator for mobility, reports "crawling" up stairs ?ADLs Comments: reports independent with ADLS, using sock aide for LBdressing ?  ? ? ?Hand Dominance  ? Dominant Hand: Right ? ?  ?Extremity/Trunk Assessment  ? Upper Extremity Assessment ?Upper Extremity Assessment: Defer to OT evaluation ?LUE Deficits / Details: decreased shoulder ROM to 60* ?LUE Coordination: decreased gross motor ?  ? ?Lower Extremity Assessment ?Lower Extremity Assessment: Generalized weakness;RLE deficits/detail;LLE deficits/detail ?RLE Deficits / Details: 3+/5 MMTs of knee extensors  DFs. 3/5 MMT hip flexors on RLE ?LLE Deficits / Details: 4/5 MMTs LLE ?  ? ?Cervical / Trunk Assessment ?Cervical / Trunk Assessment: Other exceptions ?Cervical / Trunk Exceptions: hx of back surgery, osteomyelitis at L1-2  ?Communication  ? Communication: No difficulties  ?Cognition Arousal/Alertness: Awake/alert ?Behavior During Therapy: [facility] for tasks assessed/performed ?Overall Cognitive Status: Within Functional Limits for tasks assessed ?  ?  ?  ?  ?  ?  ?  ?  ?  ?  ?  ?  ?  ?  ?  ?  ?  ?  ?  ? ?  ?General Comments General comments (skin integrity, edema, etc.): VSS ? ?  ?Exercises    ? ?Assessment/Plan  ?  ?PT Assessment Patient needs continued PT services  ?PT Problem List Decreased strength;Decreased range of motion;Decreased activity tolerance;Decreased balance;Decreased mobility;Decreased coordination;Decreased knowledge of use of DME;Pain ? ?   ?  ?PT Treatment Interventions DME instruction;Gait training;Stair training;Functional mobility training;Therapeutic activities;Therapeutic exercise;Balance training;Neuromuscular re-education;Patient/family education   ? ?PT Goals (Current goals can be found in the Care Plan section)  ?Acute Rehab PT Goals ?Patient Stated Goal: to go to a SNF ?PT Goal Formulation: With patient ?Time For Goal Achievement: 03/26/22 ?Potential to Achieve Goals: Good ? ?  ?Frequency Min 2X/week ?  ? ? ?Co-evaluation PT/OT/SLP Co-Evaluation/Treatment: Yes ?Reason for Co-Treatment: For patient/therapist safety;To address functional/ADL transfers ?PT goals addressed during session: Mobility/safety with mobility;Balance;Proper use of DME;Strengthening/ROM ?OT goals addressed during session: ADL's and self-care ?  ? ? ?  ?AM-PAC PT "6 Clicks" Mobility  ?Outcome Measure Help needed turning from your  back to your side while in a flat bed without using bedrails?: A Little ?Help needed moving from lying on your back to sitting on the side of a flat bed without using bedrails?: A Lot ?Help  needed moving to and from a bed to a chair (including a wheelchair)?: A Little ?Help needed standing up from a chair using your arms (e.g., wheelchair or bedside chair)?: A Little ?Help needed to walk in hospital room?:

## 2022-03-12 NOTE — Progress Notes (Signed)
Hayley Padilla is a 71 y.o. female with medical history significant of hypertension, hyperlipidemia, obesity (BMI 38.04), history of degenerative disc disease, posterior spinal fusion at L4-5 in 2011 and history of revision of fusion at L2-4.  Admitted last month 2/1-2/7 for back pain and progressive numbness and weakness of her right leg.  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  Status post IR disc aspiration on 2/2, culture negative.  Infectious disease was consulted, PICC line placed, and discharged to SNF on IV daptomycin and ertapenem for total duration of 6 weeks of IV antibiotics.  Switched to oral doxycycline and Levaquin by infectious disease on 3/15 and PICC removed.  Seen by ID again yesterday for continued back pain, difficulty walking, and multiple falls over the past few weeks.  Advised to continue oral antibiotics and follow-up for repeat evaluation and lab work next month.  Patient subsequently presented to the ED complaining of worsening back pain after a fall.  No fever or leukocytosis. ?Patient seen and examined this morning in the ED.  Continues to complain of back pain but feels comfortable as long as she is not moving and she appears comfortable as well.  She is able to lift her both lower extremities.  Has point tenderness on the lower lumbar spine.  She continues to be on doxycycline and Levaquin.  MRI repeat does not show any new changes.  I have consulted ID for their recommendations.  She is hemodynamically stable.  Continue rest of the medications. ?

## 2022-03-12 NOTE — Assessment & Plan Note (Signed)
Stable.  Continue home medications. 

## 2022-03-12 NOTE — Consult Note (Signed)
?   ? ? ? ? ?[facility] for Infect[facility]   ? ?Date of Admission:  03/11/2022    ? ?Total days of antibiotics  ?        ?      ?Reason for Consult: Discitis-osteomyelitis     ?Referring Provider: Doristine Bosworth ?Primary Care Provider: Nolene Ebbs, MD  ? ? ?Assessment: ?Hayley Padilla is a 71 y.o. female awaiting hospital admission with chronic back pain, RLE weakness and multiple falls at home. She has been undergoing treatment for 8 weeks for culture negative discitis-osteomyelitis at L1-2 with ventral epidural abscess. She has not had much improvement in pain or weakness but no progressive neurologic symptoms. I think her urinary incontinence is functional not neurologic in etiology.  ? ?She is very hopeful to avoid any possible surgery and would like to continue on medical treatment; however, she has completed a standard course of treatment with persistent epidural abscess. Unclear if she will experience any meaningful benefit from ongoing antibiotic treatment. I think it is reasonable to ask Dr. Annette Stable or one of his neurosurgery colleagues to see her in consultation for an opinion given ongoing epidural abscess despite broad empiric antibiotic treatment. Can continue home antibiotic PO regimen doxycycline and levofloxacin.  ? ?Pending likely SNF discharge for progressive weakness and frequent falling at home.  ? ? ?Plan: ?Continue doxycycline and levofloxacin PO from home ?Would ask neurosurgery opinion on persistent epidural abscess after 8 weeks broad spectrum antibiotics  ? ? ?Principal Problem: ?  Vertebral osteomyelitis ([place]) ?Active Problems: ?  Essential hypertension ?  HLD (hyperlipidemia) ?  Edema of right lower extremity ?  Hypokalemia ? ? ? amLODipine  10 mg Oral Daily  ? cloNIDine  0.1 mg Oral BID  ? doxycycline  100 mg Oral BID  ? furosemide  40 mg Oral Daily  ? gabapentin  300 mg Oral TID  ? heparin injection (subcutaneous)  5,000 Units Subcutaneous Q8H  ? levofloxacin  500 mg Oral Daily  ?  losartan  100 mg Oral Daily  ? pravastatin  40 mg Oral q1800  ? ? ?HPI: Hayley Padilla is a 71 y.o. female admitted for frequent falls and weakness at home.  ? ?Hayley Padilla describes a long history of back pain and previous vertebral surgeries. She had a posterior spinal fusion at L4-5 level in 2011 at the [place]; second surgery for revision with Dr. Annette Stable at [place] in 2000 and  ? ?She was referred to ID clinic in January 2023 - for new discitis-osteomyelitis at L1-2 with ventral epidural space enhancement concerning for developing abscess. Mass effect on the L2 nerve root with severe central canal stenosis. She saw Dr. Megan Salon 01/13/22 for treatment of this. She was recommended IR aspiration and PICC line with IV antibiotics. Cultures from aspirate did not yield any organism. She completed 6 weeks of IV daptomycin + ertapenem in a skilled nursing facility with discharge home about 4 weeks ago. She has been following with Dr. Megan Salon and transitioned to oral doxycycline + levofloxacin at her follow up 3/15. She had another telephone visit with him yesterday 3/29 and she was still having back pain and increased falling since discharge home.  ? ?She was taken to the ER after another fall at home with concerns for needing return to rehab facility.  ?In speaking further with Hayley Padilla she states that she has never had any good meaningful or durable improvement in her back pain over the course of her treatment.  She has to drag her right foot essentially and struggles most trying to get up from sitting. Does lose control of her bladder sometimes with effort to try to get up. She does not feel like she can stay at home safely without more help getting up and down. She was going to start physical therapy at home recently but only saw them 1 time since SNF discharge.  ? ?Her CRP has normalized and ESR is mildly elevated at 33. Back MRI was repeated revealing persistent changes of osteomyelitis / discitis with disc space height loss,  end plate irregularity, epidural phlegmon within the left epidural space 1.4 x 0.9 x 2.3 cm probably small posterior collection 61m described. Potential affect to descending L2 nerve root.  ?She has not had any side effects from her antibiotics at all over the course of the last 8 weeks.  ? ? ?Review of Systems: ?Review of Systems  ?Constitutional:  Negative for chills, fever, malaise/fatigue and weight loss.  ?HENT:  Negative for sore throat.   ?Respiratory:  Negative for cough and sputum production.   ?Cardiovascular:  Negative for chest pain and leg swelling.  ?Gastrointestinal:  Negative for abdominal pain, diarrhea and vomiting.  ?Genitourinary:  Negative for dysuria and flank pain.  ?Musculoskeletal:  Positive for back pain and falls. Negative for joint pain, myalgias and neck pain.  ?Skin:  Negative for rash.  ?Neurological:  Positive for focal weakness. Negative for dizziness, tingling and headaches.  ?Psychiatric/Behavioral:  Negative for depression and substance abuse. The patient is not nervous/anxious and does not have insomnia.   ? ?Past Medical History:  ?Diagnosis Date  ? Allergy   ? Arthritis   ? Hyperlipidemia   ? Hypertension   ? PONV (postoperative nausea and vomiting)   ? ? ?Social History  ? ?Tobacco Use  ? Smoking status: Never  ? Smokeless tobacco: Current  ?  Types: Chew  ? Tobacco comments:  ?  last used 03/17/2020 @ 2200  ?Vaping Use  ? Vaping Use: Never used  ?Substance Use Topics  ? Alcohol use: Not Currently  ? Drug use: Never  ? ? ?Family History  ?Problem Relation Age of Onset  ? Colon cancer Neg Hx   ? Colon polyps Neg Hx   ? Esophageal cancer Neg Hx   ? Rectal cancer Neg Hx   ? Stomach cancer Neg Hx   ? ?Allergies  ?Allergen Reactions  ? Penicillins Anaphylaxis  ?  Did it involve swelling of the face/tongue/throat, SOB, or low BP? Yes ?Did it involve sudden or severe rash/hives, skin peeling, or any reaction on the inside of your mouth or nose? No ?Did you need to seek medical  attention at a hospital or doctor's office? Yes ?When did it last happen?      "Been a While"  ?If all above answers are ?NO?, may proceed with cephalosporin use. ?  ? Aspirin Other (See Comments)  ?  brusing _ patient requested to be listed  ? ? ?OBJECTIVE: ?Blood pressure 119/69, pulse 82, temperature 98.5 ?F (36.9 ?C), temperature source Oral, resp. rate 16, height 5' 2"  (1.575 m), weight 94.3 kg, SpO2 100 %. ? ?Physical Exam ? ?Lab Results ?Lab Results  ?Component Value Date  ? WBC 6.3 03/12/2022  ? HGB 10.4 (L) 03/12/2022  ? HCT 32.5 (L) 03/12/2022  ? MCV 73.0 (L) 03/12/2022  ? PLT 296 03/12/2022  ?  ?Lab Results  ?Component Value Date  ? CREATININE 0.66 03/12/2022  ? BUN  10 03/12/2022  ? NA 142 03/12/2022  ? K 3.7 03/12/2022  ? CL 107 03/12/2022  ? CO2 27 03/12/2022  ?  ?Lab Results  ?Component Value Date  ? ALT 14 03/12/2022  ? AST 16 03/12/2022  ? ALKPHOS 133 (H) 03/12/2022  ? BILITOT 0.6 03/12/2022  ?  ? ?Microbiology: ?No results found for this or any previous visit (from the past 240 hour(s)). ? ?Janene Madeira, MSN, NP-C ?[facility] ?[facility] ?Pager: [phone] ? ?03/12/2022 ?3:13 PM ?  ? ?

## 2022-03-12 NOTE — Assessment & Plan Note (Signed)
Mild and appreciated during hospitalization last month as well.  She underwent Doppler which was negative for DVT. ?

## 2022-03-12 NOTE — Assessment & Plan Note (Signed)
Mild. ?-Replace potassium.  Check magnesium level and replace if low. ?

## 2022-03-13 DIAGNOSIS — K5909 Other constipation: Secondary | ICD-10-CM | POA: Diagnosis present

## 2022-03-13 DIAGNOSIS — F17220 Nicotine dependence, chewing tobacco, uncomplicated: Secondary | ICD-10-CM | POA: Diagnosis present

## 2022-03-13 DIAGNOSIS — Z79899 Other long term (current) drug therapy: Secondary | ICD-10-CM | POA: Diagnosis not present

## 2022-03-13 DIAGNOSIS — Z880 Allergy status to penicillin: Secondary | ICD-10-CM | POA: Diagnosis not present

## 2022-03-13 DIAGNOSIS — M532X6 Spinal instabilities, lumbar region: Secondary | ICD-10-CM | POA: Diagnosis present

## 2022-03-13 DIAGNOSIS — R296 Repeated falls: Secondary | ICD-10-CM | POA: Diagnosis present

## 2022-03-13 DIAGNOSIS — M48061 Spinal stenosis, lumbar region without neurogenic claudication: Secondary | ICD-10-CM | POA: Diagnosis not present

## 2022-03-13 DIAGNOSIS — Z886 Allergy status to analgesic agent status: Secondary | ICD-10-CM | POA: Diagnosis not present

## 2022-03-13 DIAGNOSIS — M4316 Spondylolisthesis, lumbar region: Secondary | ICD-10-CM | POA: Diagnosis present

## 2022-03-13 DIAGNOSIS — M4620 Osteomyelitis of vertebra, site unspecified: Secondary | ICD-10-CM | POA: Diagnosis not present

## 2022-03-13 DIAGNOSIS — E669 Obesity, unspecified: Secondary | ICD-10-CM | POA: Diagnosis present

## 2022-03-13 DIAGNOSIS — E785 Hyperlipidemia, unspecified: Secondary | ICD-10-CM | POA: Diagnosis present

## 2022-03-13 DIAGNOSIS — I10 Essential (primary) hypertension: Secondary | ICD-10-CM | POA: Diagnosis present

## 2022-03-13 DIAGNOSIS — M4646 Discitis, unspecified, lumbar region: Secondary | ICD-10-CM | POA: Diagnosis present

## 2022-03-13 DIAGNOSIS — Z6838 Body mass index (BMI) 38.0-38.9, adult: Secondary | ICD-10-CM | POA: Diagnosis not present

## 2022-03-13 DIAGNOSIS — Z981 Arthrodesis status: Secondary | ICD-10-CM | POA: Diagnosis not present

## 2022-03-13 DIAGNOSIS — E876 Hypokalemia: Secondary | ICD-10-CM | POA: Diagnosis present

## 2022-03-13 DIAGNOSIS — R600 Localized edema: Secondary | ICD-10-CM | POA: Diagnosis not present

## 2022-03-13 MED ORDER — DICLOFENAC SODIUM 1 % EX GEL
2.0000 g | Freq: Four times a day (QID) | CUTANEOUS | Status: DC
  Administered 2022-03-14 – 2022-03-18 (×10): 2 g via TOPICAL
  Filled 2022-03-13 (×2): qty 100

## 2022-03-13 NOTE — Progress Notes (Signed)
Patient is a 71 year old female who underwent L2-L4 decompression and fusion surgery by me a little over 3 years ago.  She is also remotely status post L4-5 decompression and fusion surgery.  The patient was lost to follow-up with me and had been seeing another physician when she was discovered to have a spontaneous discitis at L1-2.  She underwent medical management and IV antibiotic treatment and then subsequent oral antibiotic treatment.  She continues to have severe mechanical pain.  She has no fevers.  She has not grown any organisms on blood cultures.  Currently her sedimentation rate and C-reactive protein levels are normal.  She has continued to space abnormality at L1-2 and evidence of instability with some chronic left-sided phlegmon causing some spinal stenosis but no severe nerve root compression.  The patient has no lower extremity pain numbness or weakness.  She complains of severe mechanical back pain with standing or attempting to walk but she has no pain at rest. ? ?I think patient has an aseptic discitis type picture above a prior fusion.  I believe that the patient requires surgical stabilization for optimal outcome.  I do not believe that there is any ongoing bacterial infection.  I discussed with the patient and her daughter the possibility moving forward with an L1-2 posterior lateral arthrodesis utilizing nonsegmental pedicle screw fixation, bone morphogenic protein and local autograft as well as allograft in hopes of improving her symptoms.  We discussed the risks involved with surgery including but not limited risk anesthesia, bleeding, infection, CSF leak, nerve root injury, fusion failure, station failure, continued pain, not benefit.  Patient has been given the option as questions.  She appears understand.  We will plan for surgery on Monday. ?

## 2022-03-13 NOTE — Progress Notes (Signed)
Mobility Specialist Progress Note: ? ? 03/13/22 1327  ?Mobility  ?Activity Ambulated with assistance to bathroom  ?Level of Assistance Minimal assist, patient does 75% or more  ?Assistive Device Front wheel walker  ?Distance Ambulated (ft) 40 ft  ?Activity Response Tolerated well  ?$Mobility charge 1 Mobility  ? ?Pt received in bed wanting to use bathroom and sit up in chair. Complaints of 8/10 back pain that increased to 10/10 at the end of ambulation. Left in chair with call bell in reach, all needs met and chair alarm activated.  ? ?Hayley Padilla ?Mobility Specialist ?Primary Phone [phone] ? ?

## 2022-03-13 NOTE — Progress Notes (Addendum)
Brief ID progress note:  ? ? ?Noted plans for OR on Monday 03/16/22 for stabilization. Noted potential consideration for aseptic discitis and Charlett Lango that this may not have any bacterial component.  ? ?Will stop her antibiotics over the weekend in preparation for surgery Monday. Please send any samplings for routine cultures (the more the better to increase yield and reassurance).  ? ?May be worth sending for molecular testing at [facility] also for further sequencing given she was initially culture negative on aspiration also.  ? ? ?D/W Dr. Earlene Plater - available as needed this weekend and will follow up Monday to coordinate sending tissue to [facility].  ? ? ?Rexene Alberts, MSN, NP-C ?[facility] ?[facility]  ?[email or web address] ?Pager: [phone] ?Office: [phone] ?[facility] Main Line: [phone] ?*Secure Chat Communication Welcome  ?

## 2022-03-13 NOTE — Progress Notes (Signed)
Physical Therapy Treatment ?Patient Details ?Name: Hayley Padilla ?MRN: [id number] ?DOB: [birth year] ?Today's Date: 03/13/2022 ? ? ?History of Present Illness 71 y.o. female presents to [facility] on 3/29 with back pain after a mechanical fall at home. 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 PMH includes recent hospital stay (February 2023), HTN, HLD, DDD, posterior spinal fusion at the L4-5 level in 2011. ? ?  ?PT Comments  ? ? Pt with good progress towards goals this session with increased tolerance for functional mobility. Min assist required throughout session for safety and for cueing. Pt with continued forward flexed posture, leaning with elbows on RW at times, cues to push through BUE's to unweight painful LE's, pt able to correct but unable to maintain. Educated pt re; back precautions for comfort, HEP, and benefits to mobility, pt verbalized understanding. Current plan remains appropriate to address deficits and maximize functional independence and decrease caregiver burden. Pt continues to benefit from skilled PT services to progress toward functional mobility goals.  ?  ?Recommendations for follow up therapy are one component of a multi-disciplinary discharge planning process, led by the attending physician.  Recommendations may be updated based on patient status, additional functional criteria and insurance authorization. ? ?Follow Up Recommendations ? Skilled nursing-short term rehab (<3 hours/day) ?  ?  ?Assistance Recommended at Discharge Frequent or constant Supervision/Assistance  ?Patient can return home with the following Two people to help with walking and/or transfers;A little help with bathing/dressing/bathroom;Assistance with cooking/housework;Assist for transportation;Help with stairs or ramp for entrance ?  ?Equipment Recommendations ? None recommended by PT  ?  ?Recommendations for Other  Services   ? ? ?  ?Precautions / Restrictions Precautions ?Precautions: Fall;Back ?Precaution Booklet Issued: No ?Precaution Comments: for comfort, pt unable to recall after education x3 ?Restrictions ?Weight Bearing Restrictions: No  ?  ? ?Mobility ? Bed Mobility ?  ?  ?  ?  ?  ?  ?  ?General bed mobility comments: OOB in recliner upon entry ?  ? ?Transfers ?Overall transfer level: Needs assistance ?Equipment used: Rolling walker (2 wheels) ?Transfers: Sit to/from Stand ?Sit to Stand: Min guard, Min assist ?  ?  ?  ?  ?  ?General transfer comment: min guard for safety and performed very slowly and cautiously with RW. min a for  cues for hand placement to stand tall instead of with forward flexed trunk. Pt c/o back pain standing more erect. ?  ? ?Ambulation/Gait ?Ambulation/Gait assistance: Min guard, Min assist ?Gait Distance (Feet): 40 Feet ?Assistive device: Rolling walker (2 wheels) ?Gait Pattern/deviations: Shuffle, Decreased stride length, Trunk flexed, Narrow base of support, Step-to pattern ?Gait velocity: decreased ?  ?  ?General Gait Details: cues for sequencing and RW proximity as pt with tendency to push RW way out in front of her, step to pattern leading with painful RLE, cues to stand tall and puch through BUE to unweight LE's, pt able to correct but unable to maintain. ? ? ?Stairs ?  ?  ?  ?  ?  ? ? ?Wheelchair Mobility ?  ? ?Modified Rankin (Stroke Patients Only) ?  ? ? ?  ?Balance Overall balance assessment: Needs assistance ?Sitting-balance support: No upper extremity supported, Feet supported ?Sitting balance-Leahy Scale: Fair ?Sitting balance - Comments: pt able to sit without UE support ?  ?Standing balance support: Bilateral upper extremity supported, During functional activity, Single extremity supported ?Standing balance-Leahy Scale: Poor ?Standing  balance comment: relies on BUE support dynamically, able to engage in ADLs with 1 UE support ?  ?  ?  ?  ?  ?  ?  ?  ?  ?  ?  ?  ? ?  ?Cognition  Arousal/Alertness: Awake/alert ?Behavior During Therapy: [facility] for tasks assessed/performed ?Overall Cognitive Status: Within Functional Limits for tasks assessed ?  ?  ?  ?  ?  ?  ?  ?  ?  ?  ?  ?  ?  ?  ?  ?  ?  ?  ?  ? ?  ?Exercises   ? ?  ?General Comments   ?  ?  ? ?Pertinent Vitals/Pain Pain Assessment ?Pain Assessment: Faces ?Faces Pain Scale: Hurts little more ?Pain Location: back, R LE ?Pain Descriptors / Indicators: Discomfort, Grimacing, Guarding ?Pain Intervention(s): Limited activity within patient's tolerance, Monitored during session, Repositioned  ? ? ?Home Living   ?  ?  ?  ?  ?  ?  ?  ?  ?  ?   ?  ?Prior Function    ?  ?  ?   ? ?PT Goals (current goals can now be found in the care plan section) Acute Rehab PT Goals ?Patient Stated Goal: to go to a SNF ?PT Goal Formulation: With patient ?Time For Goal Achievement: 03/26/22 ? ?  ?Frequency ? ? ? Min 2X/week ? ? ? ?  ?PT Plan    ? ? ?Co-evaluation   ?  ?  ?  ?  ? ?  ?AM-PAC PT "6 Clicks" Mobility   ?Outcome Measure ? Help needed turning from your back to your side while in a flat bed without using bedrails?: A Little ?Help needed moving from lying on your back to sitting on the side of a flat bed without using bedrails?: A Lot ?Help needed moving to and from a bed to a chair (including a wheelchair)?: A Little ?Help needed standing up from a chair using your arms (e.g., wheelchair or bedside chair)?: A Little ?Help needed to walk in hospital room?: A Little ?Help needed climbing 3-5 steps with a railing? : Total ?6 Click Score: 15 ? ?  ?End of Session Equipment Utilized During Treatment: Gait belt ?Activity Tolerance: Patient tolerated treatment well ?Patient left: in chair;with call bell/phone within reach;with chair alarm set ?Nurse Communication: Mobility status ?PT Visit Diagnosis: Unsteadiness on feet (R26.81);Other abnormalities of gait and mobility (R26.89);Muscle weakness (generalized) (M62.81);History of falling (Z91.81);Difficulty in walking,  not elsewhere classified (R26.2);Pain ?Pain - Right/Left: Right ?Pain - part of body: Leg;Hip ?  ? ? ?Time: 4235-3614 ?PT Time Calculation (min) (ACUTE ONLY): 17 min ? ?Charges:  $Gait Training: 8-22 mins          ?          ? ?Lenora Boys. PTA ?Acute Rehabilitation Services ?Office: [phone] ? ? ?Marlana Salvage Seena Face ?03/13/2022, 3:47 PM ? ?

## 2022-03-13 NOTE — TOC Progression Note (Signed)
Transition of Care (TOC) - Progression Note  ? ? ?Patient Details  ?Name: Hayley Padilla ?MRN: [id number] ?Date of Birth: Dec 15, 1950 ? ?Transition of Care (TOC) CM/SW Contact  ?Coralee Pesa, LCSWA ?Phone Number: ?03/13/2022, 11:06 AM ? ?Clinical Narrative:    ?CSW followed up with pt to discuss SNF. She is agreeable and additional bed offers were given. Pt noted she would be okay going back to [facility] as she knew the facility had everything she needed. She asked CSW to speak with her daughter Hayley Padilla, to update her. CSW spoke with Hayley Padilla on the phone. She has concerns about pt returning to [facility] as pt fell there many times. CSW provided additional bed offers and dtr noted she would review them with the pt and let CSW know their choice. TOC will continue to follow for DC needs. ? ? ?Expected Discharge Plan: [place] ?Barriers to Discharge: Continued Medical Work up ? ?Expected Discharge Plan and Services ?Expected Discharge Plan: [place] ?  ?  ?  ?Living arrangements for the past 2 months: [place] ?                ?  ?  ?  ?  ?  ?  ?  ?  ?  ?  ? ? ?Social Determinants of Health (SDOH) Interventions ?  ? ?Readmission Risk Interventions ?   ? View : No data to display.  ?  ?  ?  ? ? ?

## 2022-03-13 NOTE — H&P (View-Only) (Signed)
Patient is a 70-year-old female who underwent L2-L4 decompression and fusion surgery by me a little over 3 years ago.  She is also remotely status post L4-5 decompression and fusion surgery.  The patient was lost to follow-up with me and had been seeing another physician when she was discovered to have a spontaneous discitis at L1-2.  She underwent medical management and IV antibiotic treatment and then subsequent oral antibiotic treatment.  She continues to have severe mechanical pain.  She has no fevers.  She has not grown any organisms on blood cultures.  Currently her sedimentation rate and C-reactive protein levels are normal.  She has continued to space abnormality at L1-2 and evidence of instability with some chronic left-sided phlegmon causing some spinal stenosis but no severe nerve root compression.  The patient has no lower extremity pain numbness or weakness.  She complains of severe mechanical back pain with standing or attempting to walk but she has no pain at rest. ? ?I think patient has an aseptic discitis type picture above a prior fusion.  I believe that the patient requires surgical stabilization for optimal outcome.  I do not believe that there is any ongoing bacterial infection.  I discussed with the patient and her daughter the possibility moving forward with an L1-2 posterior lateral arthrodesis utilizing nonsegmental pedicle screw fixation, bone morphogenic protein and local autograft as well as allograft in hopes of improving her symptoms.  We discussed the risks involved with surgery including but not limited risk anesthesia, bleeding, infection, CSF leak, nerve root injury, fusion failure, station failure, continued pain, not benefit.  Patient has been given the option as questions.  She appears understand.  We will plan for surgery on Monday. ?

## 2022-03-13 NOTE — Consult Note (Signed)
? ?Providing Compassionate, Quality Care - Together ? ? ?Reason for Consult: Osteomyelitis ?Referring Physician: Dr. Jacqulyn Bath ? ?Hayley Padilla is an 71 y.o. female.  ?HPI: Ms. Hayley Padilla is a 71 year old female with a history significant for hyperlipidemia hypertension, and lumbar stenosis. She underwent an L4-5 fusion by an outside physician many years ago. On 06/12/2019, she underwent an L2-3 and L3-4 PLIF with Dr. Jordan Likes. She did reasonably well following her fusion and was last seen in the office by Dr. Jordan Likes on 07/04/2020. She underwent a few epidural steroid injections at L5-S1 without much benefit following her surgery. Today she complains of back pain in a band along the thoracolumbar area. She has suffered multiple falls and has chronic weakness in her RLE. She was admitted from 01/14/2022-01/20/2022 for discitis/osteomyelitis. The blood cultures and her disc aspiration did not have any growth. She was seen by Infectious Disease during that admission and was discharge on PO antibiotics. She has since finished her course of antibiotics. 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 There is an epidural phlegmon within the left ventral epidural space, resulting in spinal stenosis at this level. There is also bilateral facet hypertrophy at L1-2. Her fusions at L2-3, L3-4, and L4-5 appear stable. Neurosurgery was consulted for further evaluation and recommendations. ? ?Past Medical History:  ?Diagnosis Date  ? Allergy   ? Arthritis   ? Hyperlipidemia   ? Hypertension   ? PONV (postoperative nausea and vomiting)   ? ? ?Past Surgical History:  ?Procedure Laterality Date  ? BACK SURGERY    ? lower back  ? IR LUMBAR DISC ASPIRATION W/IMG GUIDE  01/15/2022  ? MULTIPLE TOOTH EXTRACTIONS    ? ? ?Family History  ?Problem Relation Age of Onset  ? Colon cancer Neg Hx   ? Colon polyps Neg Hx   ? Esophageal cancer Neg Hx   ? Rectal cancer  Neg Hx   ? Stomach cancer Neg Hx   ? ? ?Social History:  reports that she has never smoked. Her smokeless tobacco use includes chew. She reports that she does not currently use alcohol. She reports that she does not use drugs. ? ?Allergies:  ?Allergies  ?Allergen Reactions  ? Penicillins Anaphylaxis  ?  Did it involve swelling of the face/tongue/throat, SOB, or low BP? Yes ?Did it involve sudden or severe rash/hives, skin peeling, or any reaction on the inside of your mouth or nose? No ?Did you need to seek medical attention at a hospital or doctor's office? Yes ?When did it last happen?      "Been a While"  ?If all above answers are ?NO?, may proceed with cephalosporin use. ?  ? Aspirin Other (See Comments)  ?  brusing _ patient requested to be listed  ? ? ?Medications: I have reviewed the patient's current medications. ? ?Results for orders placed or performed during the hospital encounter of 03/11/22 (from the past 48 hour(s))  ?Basic metabolic panel     Status: Abnormal  ? Collection Time: 03/11/22  6:36 PM  ?Result Value Ref Range  ? Sodium 142 135 - 145 mmol/L  ? Potassium 3.4 (L) 3.5 - 5.1 mmol/L  ? Chloride 105 98 - 111 mmol/L  ? CO2 27 22 - 32 mmol/L  ? Glucose, Bld 113 (H) 70 - 99 mg/dL  ?  Comment: Glucose reference range applies only to samples taken after fasting for at least  8 hours.  ? BUN 9 8 - 23 mg/dL  ? Creatinine, Ser 0.59 0.44 - 1.00 mg/dL  ? Calcium 9.6 8.9 - 10.3 mg/dL  ? GFR, Estimated >60 >60 mL/min  ?  Comment: (NOTE) ?Calculated using the CKD-EPI Creatinine Equation (2021) ?  ? Anion gap 10 5 - 15  ?  Comment: Performed at [facility], [address] [place], [place] [address]  ?CBC with Differential     Status: Abnormal  ? Collection Time: 03/11/22  6:36 PM  ?Result Value Ref Range  ? WBC 7.1 4.0 - 10.5 K/uL  ? RBC 5.10 3.87 - 5.11 MIL/uL  ? Hemoglobin 11.7 (L) 12.0 - 15.0 g/dL  ? HCT 38.2 36.0 - 46.0 %  ? MCV 74.9 (L) 80.0 - 100.0 fL  ? MCH 22.9 (L) 26.0 - 34.0 pg  ? MCHC 30.6  30.0 - 36.0 g/dL  ? RDW 14.6 11.5 - 15.5 %  ? Platelets 332 150 - 400 K/uL  ? nRBC 0.0 0.0 - 0.2 %  ? Neutrophils Relative % 38 %  ? Neutro Abs 2.7 1.7 - 7.7 K/uL  ? Lymphocytes Relative 49 %  ? Lymphs Abs 3.4 0.7 - 4.0 K/uL  ? Monocytes Relative 10 %  ? Monocytes Absolute 0.7 0.1 - 1.0 K/uL  ? Eosinophils Relative 3 %  ? Eosinophils Absolute 0.2 0.0 - 0.5 K/uL  ? Basophils Relative 0 %  ? Basophils Absolute 0.0 0.0 - 0.1 K/uL  ? Immature Granulocytes 0 %  ? Abs Immature Granulocytes 0.01 0.00 - 0.07 K/uL  ?  Comment: Performed at [facility], [address] [place], [place] [address]  ?Magnesium     Status: None  ? Collection Time: 03/12/22  8:48 AM  ?Result Value Ref Range  ? Magnesium 2.0 1.7 - 2.4 mg/dL  ?  Comment: Performed at [facility], [address] [place], [place] [address]  ?CBC with Differential/Platelet     Status: Abnormal  ? Collection Time: 03/12/22  8:48 AM  ?Result Value Ref Range  ? WBC 6.3 4.0 - 10.5 K/uL  ? RBC 4.45 3.87 - 5.11 MIL/uL  ? Hemoglobin 10.4 (L) 12.0 - 15.0 g/dL  ? HCT 32.5 (L) 36.0 - 46.0 %  ? MCV 73.0 (L) 80.0 - 100.0 fL  ? MCH 23.4 (L) 26.0 - 34.0 pg  ? MCHC 32.0 30.0 - 36.0 g/dL  ? RDW 14.6 11.5 - 15.5 %  ? Platelets 296 150 - 400 K/uL  ? nRBC 0.0 0.0 - 0.2 %  ? Neutrophils Relative % 48 %  ? Neutro Abs 3.0 1.7 - 7.7 K/uL  ? Lymphocytes Relative 38 %  ? Lymphs Abs 2.4 0.7 - 4.0 K/uL  ? Monocytes Relative 11 %  ? Monocytes Absolute 0.7 0.1 - 1.0 K/uL  ? Eosinophils Relative 3 %  ? Eosinophils Absolute 0.2 0.0 - 0.5 K/uL  ? Basophils Relative 0 %  ? Basophils Absolute 0.0 0.0 - 0.1 K/uL  ? Immature Granulocytes 0 %  ? Abs Immature Granulocytes 0.02 0.00 - 0.07 K/uL  ?  Comment: Performed at [facility], [address] [place], [place] [address]  ?Sedimentation rate     Status: Abnormal  ? Collection Time: 03/12/22  8:48 AM  ?Result Value Ref Range  ? Sed Rate 33 (H) 0 - 22 mm/hr  ?  Comment: Performed at [facility], [address]  [place], [place] [address]  ?  C-reactive protein     Status: None  ? Collection Time: 03/12/22  8:48 AM  ?Result Value Ref Range  ? CRP 0.6 <1.0 mg/dL  ?  Comment: Performed at [facility], [address], [place], [place] [address]  ?Comprehensive metabolic panel     Status: Abnormal  ? Collection Time: 03/12/22  8:48 AM  ?Result Value Ref Range  ? Sodium 142 135 - 145 mmol/L  ? Potassium 3.7 3.5 - 5.1 mmol/L  ? Chloride 107 98 - 111 mmol/L  ? CO2 27 22 - 32 mmol/L  ? Glucose, Bld 126 (H) 70 - 99 mg/dL  ?  Comment: Glucose reference range applies only to samples taken after fasting for at least 8 hours.  ? BUN 10 8 - 23 mg/dL  ? Creatinine, Ser 0.66 0.44 - 1.00 mg/dL  ? Calcium 9.2 8.9 - 10.3 mg/dL  ? Total Protein 6.0 (L) 6.5 - 8.1 g/dL  ? Albumin 3.3 (L) 3.5 - 5.0 g/dL  ? AST 16 15 - 41 U/L  ? ALT 14 0 - 44 U/L  ? Alkaline Phosphatase 133 (H) 38 - 126 U/L  ? Total Bilirubin 0.6 0.3 - 1.2 mg/dL  ? GFR, Estimated >60 >60 mL/min  ?  Comment: (NOTE) ?Calculated using the CKD-EPI Creatinine Equation (2021) ?  ? Anion gap 8 5 - 15  ?  Comment: Performed at [facility], [address], [place], [place] [address]  ? ? ?DG Pelvis 1-2 Views ? ?Result Date: 03/11/2022 ?CLINICAL DATA:  Fall EXAM: PELVIS - 1-2 VIEW COMPARISON:  None FINDINGS: Osseous demineralization. Narrowing of the hip joints bilaterally, mild in degree. SI joints preserved. No acute fracture, dislocation, or bone destruction. Prior laminectomy L5 with orthopedic hardware at L3-L4. 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 IMPRESSION: No acute osseous abnormalities. Mild degenerative changes of the hip joints with postsurgical changes lumbar spine. Electronically Signed   By: Ulyses Southward M.D.   On: 03/11/2022 17:22  ? ?MR Lumbar Spine W Wo Contrast ? ?Result Date: 03/12/2022 ?CLINICAL DATA:  Follow-up examination for epidural abscess, fall. EXAM: MRI LUMBAR SPINE WITHOUT AND WITH CONTRAST TECHNIQUE: Multiplanar and multiecho pulse  sequences of the lumbar spine were obtained without and with intravenous contrast. CONTRAST:  9.27mL GADAVIST GADOBUTROL 1 MMOL/ML IV SOLN COMPARISON:  CT from earlier the same day as well as previous MRI from 01/

## 2022-03-13 NOTE — Progress Notes (Signed)
?PROGRESS NOTE ? ? ? ?Hayley Padilla  XBJ:[id number] DOB: 11-20-1951 DOA: 03/11/2022 ?PCP: Fleet Contras, MD ? ? ?Brief Narrative:  ?Hayley Padilla is a 71 y.o. female with medical history significant of hypertension, hyperlipidemia, obesity (BMI 38.04), history of degenerative disc disease, posterior spinal fusion at L4-5 in 2011 and history of revision of fusion at L2-4.  Admitted last month 2/1-2/7 for back pain and progressive numbness and weakness of her right leg.  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  Status post IR disc aspiration on 2/2, culture negative.  Infectious disease was consulted, PICC line placed, and discharged to SNF on IV daptomycin and ertapenem for total duration of 6 weeks of IV antibiotics.  Switched to oral doxycycline and Levaquin by infectious disease on 3/15 and PICC removed.  Seen by ID again yesterday for continued back pain, difficulty walking, and multiple falls over the past few weeks.  Advised to continue oral antibiotics and follow-up for repeat evaluation and lab work next month.  Patient subsequently presented to the ED complaining of worsening back pain after a fall.  No fever or leukocytosis. ? ?Assessment & Plan: ?  ?Principal Problem: ?  Vertebral osteomyelitis (HCC) ?Active Problems: ?  Edema of right lower extremity ?  Hypokalemia ?  Essential hypertension ?  HLD (hyperlipidemia) ? ?Vertebral osteomyelitis (HCC) ?-Admitted last month for back pain and progressive numbness and weakness of her right leg.  MRI revealed discitis-osteomyelitis at L1-2 and epidural phlegmon/abscess.  Status post IR disc aspiration on 2/2, culture negative.  Treated with 6 weeks of IV daptomycin and ertapenem, switch to oral doxycycline and Levaquin on 3/15. ?-Patient has continued to have back pain,  difficulty walking, and multiple falls over the past few weeks. ?-MRI of lumbar spine showing persistent changes of osteomyelitis discitis at L1-2 with similar associated left ventral epidural phlegmon and probable new small posterior component.  Resultant moderate to severe spinal stenosis is stable to perhaps slightly worsened from prior.  No other evidence of new or distant infection within the lumbar spine.  No MRI evidence of fracture or other acute traumatic injury.  Stable postsurgical changes at L2-3 through L5-S1.  No new or progressive stenosis.  ID consulted.  They recommended continuing antibiotics but also recommended another neurosurgery evaluation.  I have spoken to the answering service of neurosurgery, awaiting callback from Dr. Jake Samples who is on-call. ?  ?Hypokalemia: Resolved. ?  ?Edema of right lower extremity ?Mild and appreciated during hospitalization last month as well.  She underwent Doppler which was negative for DVT. ?  ?HLD (hyperlipidemia) ?-Continue statin ?  ?Essential hypertension ?Stable. ?-Continue home medications ? ?DVT prophylaxis: heparin injection 5,000 Units Start: 03/12/22 0600 ?  Code Status: Full Code  ?Family Communication:  None present at bedside.  Plan of care discussed with patient in length and he/she verbalized understanding and agreed with it. ? ?Status is: Observation ?The patient will require care spanning > 2 midnights and should be moved to inpatient because: Waiting to be seen by neurosurgery and placement. ? ? ?Estimated body mass index is 38.04 kg/m? as calculated from the following: ?  Height as of this encounter: 5\' 2"  (1.575 m). ?  Weight as of this encounter: 94.3 kg. ? ?  ?Nutritional Assessment: ?Body mass index is 38.04 kg/m? Marland Kitchen ?Seen by dietician.  I  agree with the assessment and plan as outlined below: ?Nutrition Status: ?  ?  ?  ? ?. ?Skin Assessment: ?I have examined the patient's skin and I agree with the wound assessment as performed by the wound  care RN as outlined below: ?  ? ?Consultants:  ?ID ?Neurosurgery ?Procedures:  ?None ? ?Antimicrobials:  ?Anti-infectives (From admission, onward)  ? ? Start     Dose/Rate Route Frequency Ordered Stop  ? 03/12/22 1000  levofloxacin (LEVAQUIN) tablet 500 mg  Status:  Discontinued       ? 500 mg Oral Daily 03/12/22 0052 03/12/22 0444  ? 03/12/22 1000  doxycycline (VIBRA-TABS) tablet 100 mg       ?Note to Pharmacy: Patient taking differently: Bid x 30 days    ? 100 mg Oral 2 times daily 03/12/22 0444    ? 03/12/22 1000  levofloxacin (LEVAQUIN) tablet 500 mg       ?Note to Pharmacy: Patient taking differently: Qd x 30 days    ? 500 mg Oral Daily 03/12/22 0444    ? 03/12/22 0100  doxycycline (VIBRA-TABS) tablet 100 mg  Status:  Discontinued       ? 100 mg Oral Every 12 hours 03/12/22 0052 03/12/22 0444  ? ?  ?  ? ? ?Subjective: ?Seen and examined.  She feels better as long as she is not moving.  Pain is slightly better than yesterday as well. ? ?Objective: ?Vitals:  ? 03/12/22 1947 03/12/22 2329 03/13/22 [id number] 03/13/22 0801  ?BP: 140/80 (!) 153/69 114/63 (!) 100/50  ?Pulse: (!) 103 96 81 85  ?Resp: 16 17 17 17   ?Temp: 98.1 ?F (36.7 ?C) 98.3 ?F (36.8 ?C) 98.6 ?F (37 ?C) 98.7 ?F (37.1 ?C)  ?TempSrc: Oral Oral Oral Oral  ?SpO2: 100% 100% 97% 98%  ?Weight:      ?Height:      ? ? ?Intake/Output Summary (Last 24 hours) at 03/13/2022 1235 ?Last data filed at 03/13/2022 0900 ?Gross per 24 hour  ?Intake 290 ml  ?Output --  ?Net 290 ml  ? ?Filed Weights  ? 03/11/22 1415  ?Weight: 94.3 kg  ? ? ?Examination: ? ?General exam: Appears calm and comfortable  ?Respiratory system: Clear to auscultation. Respiratory effort normal. ?Cardiovascular system: S1 & S2 heard, RRR. No JVD, murmurs, rubs, gallops or clicks. No pedal edema. ?Gastrointestinal system: Abdomen is nondistended, soft and nontender. No organomegaly or masses felt. Normal bowel sounds heard. ?Central nervous system: Alert and oriented. No focal neurological  deficits. ?Extremities: Symmetric 5 x 5 power. ?Skin: No rashes, lesions or ulcers ?Psychiatry: Judgement and insight appear normal. Mood & affect appropriate.  ? ? ?Data Reviewed: I have personally reviewed following labs and imaging studies ? ?CBC: ?Recent Labs  ?Lab 03/10/22 ?1100 03/11/22 ?1836 03/12/22 ?[id number]  ?WBC 5.8 7.1 6.3  ?NEUTROABS  --  2.7 3.0  ?HGB 11.6* 11.7* 10.4*  ?HCT 37.4 38.2 32.5*  ?MCV 75.3* 74.9* 73.0*  ?PLT 325 332 296  ? ?Basic Metabolic Panel: ?Recent Labs  ?Lab 03/10/22 ?1100 03/11/22 ?1836 03/12/22 ?[id number]  ?NA 144 142 142  ?K 3.6 3.4* 3.7  ?CL 106 105 107  ?CO2 25 27 27   ?GLUCOSE 101* 113* 126*  ?BUN 12 9 10   ?CREATININE 0.63 0.59 0.66  ?CALCIUM 9.5 9.6 9.2  ?MG  --   --  2.0  ? ?GFR: ?Estimated Creatinine Clearance: 68 mL/min (by C-G formula based on SCr of 0.66 mg/dL). ?Liver Function Tests: ?Recent Labs  ?Lab 03/10/22 ?1100  03/12/22 ?3299  ?AST 15 16  ?ALT 11 14  ?ALKPHOS  --  133*  ?BILITOT 0.3 0.6  ?PROT 6.9 6.0*  ?ALBUMIN  --  3.3*  ? ?No results for input(s): LIPASE, AMYLASE in the last 168 hours. ?No results for input(s): AMMONIA in the last 168 hours. ?Coagulation Profile: ?No results for input(s): INR, PROTIME in the last 168 hours. ?Cardiac Enzymes: ?No results for input(s): CKTOTAL, CKMB, CKMBINDEX, TROPONINI in the last 168 hours. ?BNP (last 3 results) ?No results for input(s): PROBNP in the last 8760 hours. ?HbA1C: ?No results for input(s): HGBA1C in the last 72 hours. ?CBG: ?No results for input(s): GLUCAP in the last 168 hours. ?Lipid Profile: ?No results for input(s): CHOL, HDL, LDLCALC, TRIG, CHOLHDL, LDLDIRECT in the last 72 hours. ?Thyroid Function Tests: ?No results for input(s): TSH, T4TOTAL, FREET4, T3FREE, THYROIDAB in the last 72 hours. ?Anemia Panel: ?No results for input(s): VITAMINB12, FOLATE, FERRITIN, TIBC, IRON, RETICCTPCT in the last 72 hours. ?Sepsis Labs: ?No results for input(s): PROCALCITON, LATICACIDVEN in the last 168 hours. ? ?No results found for this  or any previous visit (from the past 240 hour(s)).  ? ?Radiology Studies: ?DG Pelvis 1-2 Views ? ?Result Date: 03/11/2022 ?CLINICAL DATA:  Fall EXAM: PELVIS - 1-2 VIEW COMPARISON:  None FINDINGS: Osseous demineralization. Narrow

## 2022-03-14 DIAGNOSIS — M4620 Osteomyelitis of vertebra, site unspecified: Secondary | ICD-10-CM | POA: Diagnosis not present

## 2022-03-14 MED ORDER — BISACODYL 10 MG RE SUPP
10.0000 mg | Freq: Once | RECTAL | Status: DC
  Filled 2022-03-14: qty 1

## 2022-03-14 MED ORDER — POLYETHYLENE GLYCOL 3350 17 G PO PACK
17.0000 g | PACK | Freq: Every day | ORAL | Status: DC
  Administered 2022-03-14 – 2022-03-18 (×3): 17 g via ORAL
  Filled 2022-03-14 (×5): qty 1

## 2022-03-14 MED ORDER — DOCUSATE SODIUM 100 MG PO CAPS
100.0000 mg | ORAL_CAPSULE | Freq: Two times a day (BID) | ORAL | Status: DC
Start: 2022-03-14 — End: 2022-03-18
  Administered 2022-03-14 – 2022-03-18 (×8): 100 mg via ORAL
  Filled 2022-03-14 (×8): qty 1

## 2022-03-14 NOTE — TOC Progression Note (Signed)
Transition of Care (TOC) - Progression Note  ? ? ?Patient Details  ?Name: Hayley Padilla ?MRN: [id number] ?Date of Birth: January 28, 1951 ? ?Transition of Care (TOC) CM/SW Contact  ?Adarryl Goldammer Renold Don, LCSWA ?Phone Number: ?03/14/2022, 12:56 PM ? ?Clinical Narrative:    ?CSW contacted pt daughter who stated she did not like any of the facilities bc they were 1 star but is considering [facility] after she visits the facility. PT daughter is not able to visit [facility] today and states the pt will make the decision on what SNF she will go to.  ? ? ?Expected Discharge Plan: [place] ?Barriers to Discharge: Continued Medical Work up ? ?Expected Discharge Plan and Services ?Expected Discharge Plan: [place] ?  ?  ?  ?Living arrangements for the past 2 months: [place] ?                ?  ?  ?  ?  ?  ?  ?  ?  ?  ?  ? ? ?Social Determinants of Health (SDOH) Interventions ?  ? ?Readmission Risk Interventions ?   ? View : No data to display.  ?  ?  ?  ? ? ?

## 2022-03-14 NOTE — Progress Notes (Signed)
?PROGRESS NOTE ? ? ? ?Hayley Padilla  GEZ:[id number] DOB: December 03, 1951 DOA: 03/11/2022 ?PCP: Fleet Contras, MD ? ? ?Brief Narrative:  ?Hayley Padilla is a 71 y.o. female with medical history significant of hypertension, hyperlipidemia, obesity (BMI 38.04), history of degenerative disc disease, posterior spinal fusion at L4-5 in 2011 and history of revision of fusion at L2-4.  Admitted last month 2/1-2/7 for back pain and progressive numbness and weakness of her right leg.  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  Status post IR disc aspiration on 2/2, culture negative.  Infectious disease was consulted, PICC line placed, and discharged to SNF on IV daptomycin and ertapenem for total duration of 6 weeks of IV antibiotics.  Switched to oral doxycycline and Levaquin by infectious disease on 3/15 and PICC removed.  Seen by ID again yesterday for continued back pain, difficulty walking, and multiple falls over the past few weeks.  Advised to continue oral antibiotics and follow-up for repeat evaluation and lab work next month.  Patient subsequently presented to the ED complaining of worsening back pain after a fall.  No fever or leukocytosis. ? ?Assessment & Plan: ?  ?Principal Problem: ?  Vertebral osteomyelitis (HCC) ?Active Problems: ?  Edema of right lower extremity ?  Hypokalemia ?  Discitis of lumbar region ?  Essential hypertension ?  HLD (hyperlipidemia) ? ?Vertebral osteomyelitis (HCC) ?-Admitted last month for back pain and progressive numbness and weakness of her right leg.  MRI revealed discitis-osteomyelitis at L1-2 and epidural phlegmon/abscess.  Status post IR disc aspiration on 2/2, culture negative.  Treated with 6 weeks of IV daptomycin and ertapenem, switch to oral doxycycline and Levaquin on 3/15. ?-Patient has continued  to have back pain, difficulty walking, and multiple falls over the past few weeks. ?-MRI of lumbar spine showing persistent changes of osteomyelitis discitis at L1-2 with similar associated left ventral epidural phlegmon and probable new small posterior component.  Resultant moderate to severe spinal stenosis is stable to perhaps slightly worsened from prior.  No other evidence of new or distant infection within the lumbar spine.  No MRI evidence of fracture or other acute traumatic injury.  Stable postsurgical changes at L2-3 through L5-S1.  ID consulted.  They recommended continuing antibiotics but also recommended another neurosurgery evaluation.  Seen by neurosurgery.  Patient is scheduled for surgical procedure on Monday.  Antibiotics per ID. ?  ?Hypokalemia: Resolved. ?  ?Edema of right lower extremity ?Mild and appreciated during hospitalization last month as well.  She underwent Doppler which was negative for DVT. ?  ?HLD (hyperlipidemia) ?-Continue statin ?  ?Essential hypertension ?Stable. ?-Continue home medications ? ?DVT prophylaxis: heparin injection 5,000 Units Start: 03/12/22 0600 ?  Code Status: Full Code  ?Family Communication:  None present at bedside.  Plan of care discussed with patient in length and he/she verbalized understanding and agreed with it. ? ?Status is: Inpatient ?Remains inpatient appropriate because: Scheduled for surgical procedure with neurosurgeon on Monday. ? ? ? ? ?Estimated body mass index is 38.04 kg/m? as calculated from the following: ?  Height as of this encounter: 5\' 2"  (1.575 m). ?  Weight as of this encounter: 94.3 kg. ? ?  ?Nutritional Assessment: ?Body mass index is 38.04 kg/m? Marland Kitchen ?Seen by dietician.  I agree with the assessment and plan as outlined below: ?Nutrition Status: ?  ?  ?  ? ?. ?  Skin Assessment: ?I have examined the patient's skin and I agree with the wound assessment as performed by the wound care RN as outlined below: ?  ? ?Consultants:   ?ID ?Neurosurgery ?Procedures:  ?None ? ?Antimicrobials:  ?Anti-infectives (From admission, onward)  ? ? Start     Dose/Rate Route Frequency Ordered Stop  ? 03/12/22 1000  levofloxacin (LEVAQUIN) tablet 500 mg  Status:  Discontinued       ? 500 mg Oral Daily 03/12/22 0052 03/12/22 0444  ? 03/12/22 1000  doxycycline (VIBRA-TABS) tablet 100 mg  Status:  Discontinued       ?Note to Pharmacy: Patient taking differently: Bid x 30 days    ? 100 mg Oral 2 times daily 03/12/22 0444 03/13/22 1956  ? 03/12/22 1000  levofloxacin (LEVAQUIN) tablet 500 mg  Status:  Discontinued       ?Note to Pharmacy: Patient taking differently: Qd x 30 days    ? 500 mg Oral Daily 03/12/22 0444 03/13/22 1956  ? 03/12/22 0100  doxycycline (VIBRA-TABS) tablet 100 mg  Status:  Discontinued       ? 100 mg Oral Every 12 hours 03/12/22 0052 03/12/22 0444  ? ?  ?  ? ? ?Subjective: ?Seen and examined.  No new complaint.  Stable back pain. ? ?Objective: ?Vitals:  ? 03/13/22 1544 03/13/22 2136 03/14/22 0420 03/14/22 0745  ?BP: 118/64 133/62 120/67 109/60  ?Pulse: 88 91 88 78  ?Resp: 18 18 18 18   ?Temp: 98.4 ?F (36.9 ?C) 98.1 ?F (36.7 ?C) 98 ?F (36.7 ?C) 97.8 ?F (36.6 ?C)  ?TempSrc: Oral Oral Oral   ?SpO2: 100% 100% 100% 100%  ?Weight:      ?Height:      ? ? ?Intake/Output Summary (Last 24 hours) at 03/14/2022 1050 ?Last data filed at 03/14/2022 1044 ?Gross per 24 hour  ?Intake 300 ml  ?Output --  ?Net 300 ml  ? ? ?Filed Weights  ? 03/11/22 1415  ?Weight: 94.3 kg  ? ? ?Examination: ? ?General exam: Appears calm and comfortable  ?Respiratory system: Clear to auscultation. Respiratory effort normal. ?Cardiovascular system: S1 & S2 heard, RRR. No JVD, murmurs, rubs, gallops or clicks. No pedal edema. ?Gastrointestinal system: Abdomen is nondistended, soft and nontender. No organomegaly or masses felt. Normal bowel sounds heard. ?Central nervous system: Alert and oriented. No focal neurological deficits. ?Extremities: Symmetric 5 x 5 power. ?Skin: No rashes,  lesions or ulcers.  ?Psychiatry: Judgement and insight appear normal. Mood & affect appropriate.  ? ?Data Reviewed: I have personally reviewed following labs and imaging studies ? ?CBC: ?Recent Labs  ?Lab 03/10/22 ?1100 03/11/22 ?1836 03/12/22 ?03/14/22  ?WBC 5.8 7.1 6.3  ?NEUTROABS  --  2.7 3.0  ?HGB 11.6* 11.7* 10.4*  ?HCT 37.4 38.2 32.5*  ?MCV 75.3* 74.9* 73.0*  ?PLT 325 332 296  ? ? ?Basic Metabolic Panel: ?Recent Labs  ?Lab 03/10/22 ?1100 03/11/22 ?1836 03/12/22 ?03/14/22  ?NA 144 142 142  ?K 3.6 3.4* 3.7  ?CL 106 105 107  ?CO2 25 27 27   ?GLUCOSE 101* 113* 126*  ?BUN 12 9 10   ?CREATININE 0.63 0.59 0.66  ?CALCIUM 9.5 9.6 9.2  ?MG  --   --  2.0  ? ? ?GFR: ?Estimated Creatinine Clearance: 68 mL/min (by C-G formula based on SCr of 0.66 mg/dL). ?Liver Function Tests: ?Recent Labs  ?Lab 03/10/22 ?1100 03/12/22 ?  ?AST 15 16  ?ALT 11 14  ?ALKPHOS  --  133*  ?BILITOT 0.3 0.6  ?PROT 6.9  6.0*  ?ALBUMIN  --  3.3*  ? ? ?No results for input(s): LIPASE, AMYLASE in the last 168 hours. ?No results for input(s): AMMONIA in the last 168 hours. ?Coagulation Profile: ?No results for input(s): INR, PROTIME in the last 168 hours. ?Cardiac Enzymes: ?No results for input(s): CKTOTAL, CKMB, CKMBINDEX, TROPONINI in the last 168 hours. ?BNP (last 3 results) ?No results for input(s): PROBNP in the last 8760 hours. ?HbA1C: ?No results for input(s): HGBA1C in the last 72 hours. ?CBG: ?No results for input(s): GLUCAP in the last 168 hours. ?Lipid Profile: ?No results for input(s): CHOL, HDL, LDLCALC, TRIG, CHOLHDL, LDLDIRECT in the last 72 hours. ?Thyroid Function Tests: ?No results for input(s): TSH, T4TOTAL, FREET4, T3FREE, THYROIDAB in the last 72 hours. ?Anemia Panel: ?No results for input(s): VITAMINB12, FOLATE, FERRITIN, TIBC, IRON, RETICCTPCT in the last 72 hours. ?Sepsis Labs: ?No results for input(s): PROCALCITON, LATICACIDVEN in the last 168 hours. ? ?No results found for this or any previous visit (from the past 240 hour(s)).   ? ?Radiology Studies: ?No results found. ? ?Scheduled Meds: ? amLODipine  10 mg Oral Daily  ? cloNIDine  0.1 mg Oral BID  ? diclofenac Sodium  2 g Topical QID  ? furosemide  40 mg Oral Daily  ? gabapentin  300 mg Oral TID  ? h

## 2022-03-14 NOTE — Progress Notes (Signed)
? ?  Providing Compassionate, Quality Care - Together ? ?NEUROSURGERY PROGRESS NOTE ? ? ?S: No issues overnight.  ? ?O: EXAM:  ?BP 109/60 (BP Location: Left Arm)   Pulse 78   Temp 97.8 ?F (36.6 ?C)   Resp 18   Ht 5\' 2"  (1.575 m)   Wt 94.3 kg   SpO2 100%   BMI 38.04 kg/m?  ? ?Awake, alert, oriented x3 ?PERRL ?Speech fluent, appropriate  ?CNs grossly intact  ?Moves all extremities equally. ? ?ASSESSMENT:  ?71 y.o. female with  ? 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 ?PLAN: ?-Planned surgical intervention on Monday with Dr. Annette Stable ?-Continue pain control ?-I answered all of her questions. ? ? ? ?Thank you for allowing me to participate in this patient's care.  Please do not hesitate to call with questions or concerns. ? ? ?Thalia Turkington, DO ?Neurosurgeon ?[facility] ?Cell: [phone] ? ?

## 2022-03-15 DIAGNOSIS — M4620 Osteomyelitis of vertebra, site unspecified: Secondary | ICD-10-CM | POA: Diagnosis not present

## 2022-03-15 NOTE — Progress Notes (Signed)
? ?  Providing Compassionate, Quality Care - Together ? ?NEUROSURGERY PROGRESS NOTE ? ? ?S: No issues overnight.  ? ?O: EXAM:  ?BP 118/70 (BP Location: Left Arm)   Pulse 77   Temp 97.7 ?F (36.5 ?C) (Oral)   Resp 18   Ht 5\' 2"  (1.575 m)   Wt 94.3 kg   SpO2 98%   BMI 38.04 kg/m?  ? ?Awake, alert, oriented x3 ?PERRL ?Speech fluent, appropriate  ?CNs grossly intact  ?Moves all extremities equally. ?  ?ASSESSMENT:  ?71 y.o. female with  ?  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  ?PLAN: ?-Planned surgical intervention on Monday with Dr. Wednesday ?-Continue pain control ?-N.p.o. at midnight ?  ?  ? ? ?Thank you for allowing me to participate in this patient's care.  Please do not hesitate to call with questions or concerns. ? ? ?Sandralee Tarkington, DO ?Neurosurgeon ?[facility] ?Cell: [phone] ? ?

## 2022-03-15 NOTE — Progress Notes (Signed)
?PROGRESS NOTE ? ? ? ?Hayley Padilla  [id number] DOB: Feb 13, 1951 DOA: 03/11/2022 ?PCP: Nolene Ebbs, MD ? ? ?Brief Narrative:  ?Hayley Padilla is a 71 y.o. female with medical history significant of hypertension, hyperlipidemia, obesity (BMI 38.04), history of degenerative disc disease, posterior spinal fusion at L4-5 in 2011 and history of revision of fusion at L2-4.  Admitted last month 2/1-2/7 for back pain and progressive numbness and weakness of her right leg.  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  Status post IR disc aspiration on 2/2, culture negative.  Infectious disease was consulted, PICC line placed, and discharged to SNF on IV daptomycin and ertapenem for total duration of 6 weeks of IV antibiotics.  Switched to oral doxycycline and Levaquin by infectious disease on 3/15 and PICC removed.  Seen by ID again yesterday for continued back pain, difficulty walking, and multiple falls over the past few weeks.  Advised to continue oral antibiotics and follow-up for repeat evaluation and lab work next month.  Patient subsequently presented to the ED complaining of worsening back pain after a fall.  No fever or leukocytosis. ? ?Assessment & Plan: ?  ?Principal Problem: ?  Vertebral osteomyelitis ([place]) ?Active Problems: ?  Edema of right lower extremity ?  Hypokalemia ?  Discitis of lumbar region ?  Essential hypertension ?  HLD (hyperlipidemia) ? ?Vertebral osteomyelitis ([place]) ?-Admitted last month for back pain and progressive numbness and weakness of her right leg.  MRI revealed discitis-osteomyelitis at L1-2 and epidural phlegmon/abscess.  Status post IR disc aspiration on 2/2, culture negative.  Treated with 6 weeks of IV daptomycin and ertapenem, switch to oral doxycycline and Levaquin on 3/15. ?-Patient has continued  to have back pain, difficulty walking, and multiple falls over the past few weeks. ?-MRI of lumbar spine showing persistent changes of osteomyelitis discitis at L1-2 with similar associated left ventral epidural phlegmon and probable new small posterior component.  Resultant moderate to severe spinal stenosis is stable to perhaps slightly worsened from prior.  No other evidence of new or distant infection within the lumbar spine.  No MRI evidence of fracture or other acute traumatic injury.  Stable postsurgical changes at L2-3 through L5-S1.  ID consulted.  They recommended continuing antibiotics but also recommended another neurosurgery evaluation.  Seen by neurosurgery.  Patient is scheduled for surgical procedure on Monday.  Antibiotics on hold per ID. ?  ?Hypokalemia: Resolved. ?  ?Edema of right lower extremity ?Mild and appreciated during hospitalization last month as well.  She underwent Doppler which was negative for DVT. ?  ?HLD (hyperlipidemia) ?-Continue statin ?  ?Essential hypertension ?Stable. ?-Continue home medications ? ?DVT prophylaxis: heparin injection 5,000 Units Start: 03/12/22 0600 ?  Code Status: Full Code  ?Family Communication:  None present at bedside.  Plan of care discussed with patient in length and he/she verbalized understanding and agreed with it. ? ?Status is: Inpatient ?Remains inpatient appropriate because: Scheduled for surgical procedure with neurosurgeon on Monday. ? ? ? ? ?Estimated body mass index is 38.04 kg/m? as calculated from the following: ?  Height as of this encounter: 5\' 2"  (1.575 m). ?  Weight as of this encounter: 94.3 kg. ? ?  ?Nutritional Assessment: ?Body mass index is 38.04 kg/m?Marland KitchenMarland Kitchen ?Seen by dietician.  I agree with the assessment and plan as outlined below: ?  Nutrition Status: ?  ?  ?  ? ?. ?Skin Assessment: ?I have examined the patient's skin and I agree with the wound assessment as performed by the wound care RN as outlined below: ?  ? ?Consultants:   ?ID ?Neurosurgery ?Procedures:  ?None ? ?Antimicrobials:  ?Anti-infectives (From admission, onward)  ? ? Start     Dose/Rate Route Frequency Ordered Stop  ? 03/12/22 1000  levofloxacin (LEVAQUIN) tablet 500 mg  Status:  Discontinued       ? 500 mg Oral Daily 03/12/22 0052 03/12/22 0444  ? 03/12/22 1000  doxycycline (VIBRA-TABS) tablet 100 mg  Status:  Discontinued       ?Note to Pharmacy: Patient taking differently: Bid x 30 days    ? 100 mg Oral 2 times daily 03/12/22 0444 03/13/22 1956  ? 03/12/22 1000  levofloxacin (LEVAQUIN) tablet 500 mg  Status:  Discontinued       ?Note to Pharmacy: Patient taking differently: Qd x 30 days    ? 500 mg Oral Daily 03/12/22 0444 03/13/22 1956  ? 03/12/22 0100  doxycycline (VIBRA-TABS) tablet 100 mg  Status:  Discontinued       ? 100 mg Oral Every 12 hours 03/12/22 0052 03/12/22 0444  ? ?  ?  ? ? ?Subjective: ? ?Patient seen and examined.  No new complaint. ? ?Objective: ?Vitals:  ? 03/14/22 1621 03/14/22 2013 03/15/22 0355 03/15/22 0739  ?BP: 117/70 137/83 (!) 114/58 118/70  ?Pulse: 91 89 78 77  ?Resp: 18 18 15 18   ?Temp: 98.2 ?F (36.8 ?C) 98.2 ?F (36.8 ?C) 98.3 ?F (36.8 ?C) 97.7 ?F (36.5 ?C)  ?TempSrc:  Oral  Oral  ?SpO2: 100% 99% 100% 98%  ?Weight:      ?Height:      ? ? ?Intake/Output Summary (Last 24 hours) at 03/15/2022 1355 ?Last data filed at 03/15/2022 0500 ?Gross per 24 hour  ?Intake 200 ml  ?Output 300 ml  ?Net -100 ml  ? ? ?Filed Weights  ? 03/11/22 1415  ?Weight: 94.3 kg  ? ? ?Examination: ? ?General exam: Appears calm and comfortable  ?Respiratory system: Clear to auscultation. Respiratory effort normal. ?Cardiovascular system: S1 & S2 heard, RRR. No JVD, murmurs, rubs, gallops or clicks. No pedal edema. ?Gastrointestinal system: Abdomen is nondistended, soft and nontender. No organomegaly or masses felt. Normal bowel sounds heard. ?Central nervous system: Alert and oriented. No focal neurological deficits. ?Extremities: Symmetric 5 x 5 power. ?Skin: No rashes, lesions  or ulcers.  ?Psychiatry: Judgement and insight appear normal. Mood & affect appropriate.  ? ? ?Data Reviewed: I have personally reviewed following labs and imaging studies ? ?CBC: ?Recent Labs  ?Lab 03/10/22 ?1100 03/11/22 ?1836 03/12/22 ?[id number]  ?WBC 5.8 7.1 6.3  ?NEUTROABS  --  2.7 3.0  ?HGB 11.6* 11.7* 10.4*  ?HCT 37.4 38.2 32.5*  ?MCV 75.3* 74.9* 73.0*  ?PLT 325 332 296  ? ? ?Basic Metabolic Panel: ?Recent Labs  ?Lab 03/10/22 ?1100 03/11/22 ?1836 03/12/22 ?[id number]  ?NA 144 142 142  ?K 3.6 3.4* 3.7  ?CL 106 105 107  ?CO2 25 27 27   ?GLUCOSE 101* 113* 126*  ?BUN 12 9 10   ?CREATININE 0.63 0.59 0.66  ?CALCIUM 9.5 9.6 9.2  ?MG  --   --  2.0  ? ? ?GFR: ?Estimated Creatinine Clearance: 68 mL/min (by C-G formula based on SCr of 0.66 mg/dL). ?Liver Function Tests: ?Recent Labs  ?Lab 03/10/22 ?1100 03/12/22 ?[id number]  ?AST 15 16  ?ALT 11 14  ?ALKPHOS  --  133*  ?BILITOT 0.3 0.6  ?PROT 6.9 6.0*  ?ALBUMIN  --  3.3*  ? ? ?No results for input(s): LIPASE, AMYLASE in the last 168 hours. ?No results for input(s): AMMONIA in the last 168 hours. ?Coagulation Profile: ?No results for input(s): INR, PROTIME in the last 168 hours. ?Cardiac Enzymes: ?No results for input(s): CKTOTAL, CKMB, CKMBINDEX, TROPONINI in the last 168 hours. ?BNP (last 3 results) ?No results for input(s): PROBNP in the last 8760 hours. ?HbA1C: ?No results for input(s): HGBA1C in the last 72 hours. ?CBG: ?No results for input(s): GLUCAP in the last 168 hours. ?Lipid Profile: ?No results for input(s): CHOL, HDL, LDLCALC, TRIG, CHOLHDL, LDLDIRECT in the last 72 hours. ?Thyroid Function Tests: ?No results for input(s): TSH, T4TOTAL, FREET4, T3FREE, THYROIDAB in the last 72 hours. ?Anemia Panel: ?No results for input(s): VITAMINB12, FOLATE, FERRITIN, TIBC, IRON, RETICCTPCT in the last 72 hours. ?Sepsis Labs: ?No results for input(s): PROCALCITON, LATICACIDVEN in the last 168 hours. ? ?No results found for this or any previous visit (from the past 240 hour(s)).  ? ?Radiology  Studies: ?No results found. ? ?Scheduled Meds: ? amLODipine  10 mg Oral Daily  ? bisacodyl  10 mg Rectal Once  ? cloNIDine  0.1 mg Oral BID  ? diclofenac Sodium  2 g Topical QID  ? docusate sodium  100 mg Or

## 2022-03-16 ENCOUNTER — Inpatient Hospital Stay (HOSPITAL_COMMUNITY): Payer: Medicare Other | Admitting: Certified Registered"

## 2022-03-16 ENCOUNTER — Encounter (HOSPITAL_COMMUNITY)
Admission: EM | Disposition: A | Discharge status: Skilled Nursing Facility | Payer: Self-pay | Source: Home / Self Care | Attending: Family Medicine

## 2022-03-16 ENCOUNTER — Encounter (HOSPITAL_COMMUNITY): Payer: Self-pay | Admitting: Family Medicine

## 2022-03-16 ENCOUNTER — Other Ambulatory Visit: Payer: Self-pay

## 2022-03-16 ENCOUNTER — Inpatient Hospital Stay (HOSPITAL_COMMUNITY): Payer: Medicare Other

## 2022-03-16 DIAGNOSIS — M48061 Spinal stenosis, lumbar region without neurogenic claudication: Secondary | ICD-10-CM

## 2022-03-16 DIAGNOSIS — M532X6 Spinal instabilities, lumbar region: Secondary | ICD-10-CM

## 2022-03-16 DIAGNOSIS — M4316 Spondylolisthesis, lumbar region: Secondary | ICD-10-CM

## 2022-03-16 HISTORY — PX: LAMINECTOMY WITH POSTERIOR LATERAL ARTHRODESIS LEVEL 1: SHX6335

## 2022-03-16 LAB — CBC WITH DIFFERENTIAL/PLATELET
Abs Immature Granulocytes: 0.01 10*3/uL (ref 0.00–0.07)
Basophils Absolute: 0 10*3/uL (ref 0.0–0.1)
Basophils Relative: 0 %
Eosinophils Absolute: 0.2 10*3/uL (ref 0.0–0.5)
Eosinophils Relative: 3 %
HCT: 32.4 % — ABNORMAL LOW (ref 36.0–46.0)
Hemoglobin: 10.4 g/dL — ABNORMAL LOW (ref 12.0–15.0)
Immature Granulocytes: 0 %
Lymphocytes Relative: 47 %
Lymphs Abs: 2.9 10*3/uL (ref 0.7–4.0)
MCH: 23.4 pg — ABNORMAL LOW (ref 26.0–34.0)
MCHC: 32.1 g/dL (ref 30.0–36.0)
MCV: 72.8 fL — ABNORMAL LOW (ref 80.0–100.0)
Monocytes Absolute: 0.6 10*3/uL (ref 0.1–1.0)
Monocytes Relative: 10 %
Neutro Abs: 2.4 10*3/uL (ref 1.7–7.7)
Neutrophils Relative %: 40 %
Platelets: 298 10*3/uL (ref 150–400)
RBC: 4.45 MIL/uL (ref 3.87–5.11)
RDW: 14.3 % (ref 11.5–15.5)
WBC: 6.2 10*3/uL (ref 4.0–10.5)
nRBC: 0 % (ref 0.0–0.2)

## 2022-03-16 LAB — BASIC METABOLIC PANEL
Anion gap: 7 (ref 5–15)
BUN: 14 mg/dL (ref 8–23)
CO2: 28 mmol/L (ref 22–32)
Calcium: 9.1 mg/dL (ref 8.9–10.3)
Chloride: 105 mmol/L (ref 98–111)
Creatinine, Ser: 0.62 mg/dL (ref 0.44–1.00)
GFR, Estimated: >60 mL/min (ref >60)
Glucose, Bld: 106 mg/dL — ABNORMAL HIGH (ref 70–99)
Potassium: 3.6 mmol/L (ref 3.5–5.1)
Sodium: 140 mmol/L (ref 135–145)

## 2022-03-16 LAB — TYPE AND SCREEN
ABO/RH(D): O POS
Antibody Screen: NEGATIVE

## 2022-03-16 LAB — SURGICAL PCR SCREEN
MRSA, PCR: NEGATIVE
Staphylococcus aureus: NEGATIVE

## 2022-03-16 SURGERY — LAMINECTOMY WITH POSTERIOR LATERAL ARTHRODESIS LEVEL 1
Anesthesia: General | Site: Back

## 2022-03-16 MED ORDER — ONDANSETRON HCL 4 MG PO TABS: 4.0000 mg | ORAL_TABLET | Freq: Four times a day (QID) | ORAL | Status: DC | PRN

## 2022-03-16 MED ORDER — ACETAMINOPHEN 10 MG/ML IV SOLN
INTRAVENOUS | Status: DC | PRN
  Administered 2022-03-16: 1000 mg via INTRAVENOUS

## 2022-03-16 MED ORDER — LIDOCAINE 2% (20 MG/ML) 5 ML SYRINGE
INTRAMUSCULAR | Status: AC
  Filled 2022-03-16: qty 5

## 2022-03-16 MED ORDER — PROPOFOL 10 MG/ML IV BOLUS
INTRAVENOUS | Status: AC
  Filled 2022-03-16: qty 20

## 2022-03-16 MED ORDER — OXYCODONE HCL 5 MG PO TABS: 5.0000 mg | ORAL_TABLET | Freq: Once | ORAL | Status: DC | PRN

## 2022-03-16 MED ORDER — CHLORHEXIDINE GLUCONATE 0.12 % MT SOLN
OROMUCOSAL | Status: AC
  Administered 2022-03-16: 15 mL via OROMUCOSAL
  Filled 2022-03-16: qty 15

## 2022-03-16 MED ORDER — ROCURONIUM BROMIDE 10 MG/ML (PF) SYRINGE
PREFILLED_SYRINGE | INTRAVENOUS | Status: DC | PRN
Start: 2022-03-16 — End: 2022-03-16
  Administered 2022-03-16: 20 mg via INTRAVENOUS
  Administered 2022-03-16: 70 mg via INTRAVENOUS

## 2022-03-16 MED ORDER — PROPOFOL 10 MG/ML IV BOLUS
INTRAVENOUS | Status: DC | PRN
  Administered 2022-03-16: 150 mg via INTRAVENOUS

## 2022-03-16 MED ORDER — FENTANYL CITRATE (PF) 250 MCG/5ML IJ SOLN
INTRAMUSCULAR | Status: AC
  Filled 2022-03-16: qty 5

## 2022-03-16 MED ORDER — HYDROCODONE-ACETAMINOPHEN 10-325 MG PO TABS
1.0000 | ORAL_TABLET | ORAL | Status: DC | PRN
  Administered 2022-03-17 – 2022-03-18 (×2): 1 via ORAL
  Filled 2022-03-16 (×2): qty 1

## 2022-03-16 MED ORDER — ACETAMINOPHEN 10 MG/ML IV SOLN: 1000.0000 mg | Freq: Once | INTRAVENOUS | Status: DC | PRN

## 2022-03-16 MED ORDER — HYDROMORPHONE HCL 1 MG/ML IJ SOLN
0.2500 mg | INTRAMUSCULAR | Status: DC | PRN
  Administered 2022-03-16 (×2): 0.25 mg via INTRAVENOUS

## 2022-03-16 MED ORDER — DEXMEDETOMIDINE (PRECEDEX) IN NS 20 MCG/5ML (4 MCG/ML) IV SYRINGE
PREFILLED_SYRINGE | INTRAVENOUS | Status: DC | PRN
  Administered 2022-03-16: 8 ug via INTRAVENOUS
  Administered 2022-03-16: 4 ug via INTRAVENOUS

## 2022-03-16 MED ORDER — LACTATED RINGERS IV SOLN: INTRAVENOUS | Status: DC

## 2022-03-16 MED ORDER — HYDROMORPHONE HCL 1 MG/ML IJ SOLN
1.0000 mg | INTRAMUSCULAR | Status: DC | PRN
  Administered 2022-03-17: 1 mg via INTRAVENOUS
  Filled 2022-03-16: qty 1

## 2022-03-16 MED ORDER — VANCOMYCIN HCL IN DEXTROSE 1-5 GM/200ML-% IV SOLN
1000.0000 mg | Freq: Once | INTRAVENOUS | Status: AC
  Administered 2022-03-16: 1000 mg via INTRAVENOUS

## 2022-03-16 MED ORDER — POLYETHYLENE GLYCOL 3350 17 G PO PACK: 17.0000 g | PACK | Freq: Every day | ORAL | Status: DC | PRN

## 2022-03-16 MED ORDER — VANCOMYCIN HCL 1000 MG IV SOLR
INTRAVENOUS | Status: DC | PRN
  Administered 2022-03-16: 1000 mg via TOPICAL

## 2022-03-16 MED ORDER — NALOXONE HCL 0.4 MG/ML IJ SOLN
INTRAMUSCULAR | Status: AC
  Filled 2022-03-16: qty 1

## 2022-03-16 MED ORDER — BISACODYL 10 MG RE SUPP: 10.0000 mg | Freq: Every day | RECTAL | Status: DC | PRN

## 2022-03-16 MED ORDER — PHENYLEPHRINE 40 MCG/ML (10ML) SYRINGE FOR IV PUSH (FOR BLOOD PRESSURE SUPPORT)
PREFILLED_SYRINGE | INTRAVENOUS | Status: DC | PRN
  Administered 2022-03-16: 80 ug via INTRAVENOUS
  Administered 2022-03-16: 120 ug via INTRAVENOUS

## 2022-03-16 MED ORDER — OXYCODONE HCL 5 MG PO TABS
10.0000 mg | ORAL_TABLET | ORAL | Status: DC | PRN
  Administered 2022-03-16 – 2022-03-17 (×2): 10 mg via ORAL
  Filled 2022-03-16 (×2): qty 2

## 2022-03-16 MED ORDER — PHENYLEPHRINE HCL-NACL 20-0.9 MG/250ML-% IV SOLN
INTRAVENOUS | Status: DC | PRN
  Administered 2022-03-16: 20 ug/min via INTRAVENOUS

## 2022-03-16 MED ORDER — HYDROMORPHONE HCL 1 MG/ML IJ SOLN
INTRAMUSCULAR | Status: AC
  Filled 2022-03-16: qty 1

## 2022-03-16 MED ORDER — OXYCODONE HCL 5 MG/5ML PO SOLN: 5.0000 mg | Freq: Once | ORAL | Status: DC | PRN

## 2022-03-16 MED ORDER — SODIUM CHLORIDE 0.9% FLUSH: 3.0000 mL | INTRAVENOUS | Status: DC | PRN

## 2022-03-16 MED ORDER — DEXAMETHASONE SODIUM PHOSPHATE 10 MG/ML IJ SOLN
INTRAMUSCULAR | Status: DC | PRN
  Administered 2022-03-16: 10 mg via INTRAVENOUS

## 2022-03-16 MED ORDER — SODIUM CHLORIDE 0.9% FLUSH
3.0000 mL | Freq: Two times a day (BID) | INTRAVENOUS | Status: DC
  Administered 2022-03-17 – 2022-03-18 (×3): 3 mL via INTRAVENOUS

## 2022-03-16 MED ORDER — 0.9 % SODIUM CHLORIDE (POUR BTL) OPTIME
TOPICAL | Status: DC | PRN
  Administered 2022-03-16: 1000 mL

## 2022-03-16 MED ORDER — ONDANSETRON HCL 4 MG/2ML IJ SOLN
INTRAMUSCULAR | Status: DC | PRN
  Administered 2022-03-16: 4 mg via INTRAVENOUS

## 2022-03-16 MED ORDER — THROMBIN 20000 UNITS EX SOLR: CUTANEOUS | Status: DC | PRN

## 2022-03-16 MED ORDER — DIPHENHYDRAMINE HCL 50 MG/ML IJ SOLN
INTRAMUSCULAR | Status: DC | PRN
  Administered 2022-03-16: 12.5 mg via INTRAVENOUS

## 2022-03-16 MED ORDER — SODIUM CHLORIDE 0.9 % IV SOLN: 250.0000 mL | INTRAVENOUS | Status: DC

## 2022-03-16 MED ORDER — ONDANSETRON HCL 4 MG/2ML IJ SOLN
4.0000 mg | Freq: Four times a day (QID) | INTRAMUSCULAR | Status: DC | PRN
  Administered 2022-03-17: 4 mg via INTRAVENOUS
  Filled 2022-03-16: qty 2

## 2022-03-16 MED ORDER — CHLORHEXIDINE GLUCONATE 0.12 % MT SOLN: 15.0000 mL | Freq: Once | OROMUCOSAL | Status: AC

## 2022-03-16 MED ORDER — FLEET ENEMA 7-19 GM/118ML RE ENEM: 1.0000 | ENEMA | Freq: Once | RECTAL | Status: DC | PRN

## 2022-03-16 MED ORDER — BUPIVACAINE HCL (PF) 0.25 % IJ SOLN
INTRAMUSCULAR | Status: AC
  Filled 2022-03-16: qty 30

## 2022-03-16 MED ORDER — ORAL CARE MOUTH RINSE: 15.0000 mL | Freq: Once | OROMUCOSAL | Status: AC

## 2022-03-16 MED ORDER — ONDANSETRON HCL 4 MG/2ML IJ SOLN: 4.0000 mg | Freq: Once | INTRAMUSCULAR | Status: DC | PRN

## 2022-03-16 MED ORDER — LIDOCAINE 2% (20 MG/ML) 5 ML SYRINGE
INTRAMUSCULAR | Status: DC | PRN
  Administered 2022-03-16: 100 mg via INTRAVENOUS

## 2022-03-16 MED ORDER — SUGAMMADEX SODIUM 200 MG/2ML IV SOLN
INTRAVENOUS | Status: DC | PRN
Start: 2022-03-16 — End: 2022-03-16
  Administered 2022-03-16: 200 mg via INTRAVENOUS

## 2022-03-16 MED ORDER — THROMBIN 20000 UNITS EX SOLR
CUTANEOUS | Status: AC
  Filled 2022-03-16: qty 20000

## 2022-03-16 MED ORDER — MIDAZOLAM HCL 2 MG/2ML IJ SOLN
INTRAMUSCULAR | Status: AC
  Filled 2022-03-16: qty 2

## 2022-03-16 MED ORDER — DIAZEPAM 5 MG PO TABS: 5.0000 mg | ORAL_TABLET | Freq: Four times a day (QID) | ORAL | Status: DC | PRN

## 2022-03-16 MED ORDER — ROCURONIUM BROMIDE 10 MG/ML (PF) SYRINGE
PREFILLED_SYRINGE | INTRAVENOUS | Status: AC
  Filled 2022-03-16: qty 10

## 2022-03-16 MED ORDER — ACETAMINOPHEN 650 MG RE SUPP: 650.0000 mg | RECTAL | Status: DC | PRN

## 2022-03-16 MED ORDER — FENTANYL CITRATE (PF) 250 MCG/5ML IJ SOLN
INTRAMUSCULAR | Status: DC | PRN
  Administered 2022-03-16: 50 ug via INTRAVENOUS
  Administered 2022-03-16: 25 ug via INTRAVENOUS
  Administered 2022-03-16: 75 ug via INTRAVENOUS
  Administered 2022-03-16: 50 ug via INTRAVENOUS

## 2022-03-16 MED ORDER — ACETAMINOPHEN 325 MG PO TABS
650.0000 mg | ORAL_TABLET | ORAL | Status: DC | PRN
  Administered 2022-03-16 – 2022-03-17 (×2): 650 mg via ORAL
  Filled 2022-03-16 (×2): qty 2

## 2022-03-16 MED ORDER — ACETAMINOPHEN 10 MG/ML IV SOLN
INTRAVENOUS | Status: AC
  Filled 2022-03-16: qty 100

## 2022-03-16 MED ORDER — PHENOL 1.4 % MT LIQD: 1.0000 | OROMUCOSAL | Status: DC | PRN

## 2022-03-16 MED ORDER — MENTHOL 3 MG MT LOZG: 1.0000 | LOZENGE | OROMUCOSAL | Status: DC | PRN

## 2022-03-16 MED ORDER — VANCOMYCIN HCL 500 MG/100ML IV SOLN
500.0000 mg | Freq: Two times a day (BID) | INTRAVENOUS | Status: DC
  Administered 2022-03-17 – 2022-03-18 (×3): 500 mg via INTRAVENOUS
  Filled 2022-03-16 (×4): qty 100

## 2022-03-16 MED ORDER — VANCOMYCIN HCL IN DEXTROSE 1-5 GM/200ML-% IV SOLN
INTRAVENOUS | Status: AC
  Filled 2022-03-16: qty 200

## 2022-03-16 MED ORDER — VANCOMYCIN HCL 1000 MG IV SOLR
INTRAVENOUS | Status: AC
  Filled 2022-03-16: qty 20

## 2022-03-16 MED ORDER — BUPIVACAINE HCL (PF) 0.25 % IJ SOLN
INTRAMUSCULAR | Status: DC | PRN
  Administered 2022-03-16: 20 mL

## 2022-03-16 SURGICAL SUPPLY — 59 items
BAG COUNTER SPONGE SURGICOUNT (BAG) ×3 IMPLANT
BAG DECANTER FOR FLEXI CONT (MISCELLANEOUS) ×1 IMPLANT
BENZOIN TINCTURE PRP APPL 2/3 (GAUZE/BANDAGES/DRESSINGS) ×2 IMPLANT
BLADE CLIPPER SURG (BLADE) IMPLANT
BUR CUTTER 7.0 ROUND (BURR) ×2 IMPLANT
BUR MATCHSTICK NEURO 3.0 LAGG (BURR) ×2 IMPLANT
CANISTER SUCT 3000ML PPV (MISCELLANEOUS) ×2 IMPLANT
CARTRIDGE OIL MAESTRO DRILL (MISCELLANEOUS) ×1 IMPLANT
CNTNR URN SCR LID CUP LEK RST (MISCELLANEOUS) ×1 IMPLANT
CONT SPEC 4OZ STRL OR WHT (MISCELLANEOUS) ×1
COVER BACK TABLE 60X90IN (DRAPES) ×2 IMPLANT
DECANTER SPIKE VIAL GLASS SM (MISCELLANEOUS) ×2 IMPLANT
DERMABOND ADHESIVE PROPEN (GAUZE/BANDAGES/DRESSINGS) ×1
DERMABOND ADVANCED (GAUZE/BANDAGES/DRESSINGS) ×1
DERMABOND ADVANCED .7 DNX12 (GAUZE/BANDAGES/DRESSINGS) ×1 IMPLANT
DERMABOND ADVANCED .7 DNX6 (GAUZE/BANDAGES/DRESSINGS) IMPLANT
DIFFUSER DRILL AIR PNEUMATIC (MISCELLANEOUS) ×2 IMPLANT
DRAPE C-ARM 42X72 X-RAY (DRAPES) ×4 IMPLANT
DRAPE HALF SHEET 40X57 (DRAPES) IMPLANT
DRAPE LAPAROTOMY 100X72X124 (DRAPES) ×2 IMPLANT
DRAPE SURG 17X23 STRL (DRAPES) ×8 IMPLANT
DRSG OPSITE POSTOP 4X8 (GAUZE/BANDAGES/DRESSINGS) ×1 IMPLANT
DURAPREP 26ML APPLICATOR (WOUND CARE) ×2 IMPLANT
ELECT REM PT RETURN 9FT ADLT (ELECTROSURGICAL) ×2
ELECTRODE REM PT RTRN 9FT ADLT (ELECTROSURGICAL) ×1 IMPLANT
EVACUATOR 1/8 PVC DRAIN (DRAIN) ×2 IMPLANT
GAUZE 4X4 16PLY ~~LOC~~+RFID DBL (SPONGE) ×1 IMPLANT
GAUZE SPONGE 4X4 12PLY STRL (GAUZE/BANDAGES/DRESSINGS) ×2 IMPLANT
GLOVE EXAM NITRILE XL STR (GLOVE) IMPLANT
GLOVE SURG LTX SZ9 (GLOVE) ×4 IMPLANT
GOWN STRL REUS W/ TWL LRG LVL3 (GOWN DISPOSABLE) IMPLANT
GOWN STRL REUS W/ TWL XL LVL3 (GOWN DISPOSABLE) ×2 IMPLANT
GOWN STRL REUS W/TWL 2XL LVL3 (GOWN DISPOSABLE) IMPLANT
GOWN STRL REUS W/TWL LRG LVL3 (GOWN DISPOSABLE)
GOWN STRL REUS W/TWL XL LVL3 (GOWN DISPOSABLE) ×2
GRAFT BN 10X1XDBM MAGNIFUSE (Bone Implant) IMPLANT
GRAFT BONE MAGNIFUSE 1X10CM (Bone Implant) ×2 IMPLANT
KIT BASIN OR (CUSTOM PROCEDURE TRAY) ×2 IMPLANT
KIT INFUSE X SMALL 1.4CC (Orthopedic Implant) ×1 IMPLANT
KIT TURNOVER KIT B (KITS) ×2 IMPLANT
NEEDLE HYPO 22GX1.5 SAFETY (NEEDLE) ×2 IMPLANT
NS IRRIG 1000ML POUR BTL (IV SOLUTION) ×2 IMPLANT
OIL CARTRIDGE MAESTRO DRILL (MISCELLANEOUS) ×2
PACK LAMINECTOMY NEURO (CUSTOM PROCEDURE TRAY) ×2 IMPLANT
ROD SOLERA 110MM (Rod) ×2 IMPLANT
SCREW MAS 6.5X45 (Screw) ×2 IMPLANT
SCREW SET SOLERA (Screw) ×8 IMPLANT
SCREW SET SOLERA TI (Screw) IMPLANT
SPONGE SURGIFOAM ABS GEL 100 (HEMOSTASIS) ×2 IMPLANT
SPONGE T-LAP 4X18 ~~LOC~~+RFID (SPONGE) ×2 IMPLANT
STRIP CLOSURE SKIN 1/2X4 (GAUZE/BANDAGES/DRESSINGS) ×3 IMPLANT
SUT VIC AB 0 CT1 18XCR BRD8 (SUTURE) ×2 IMPLANT
SUT VIC AB 0 CT1 8-18 (SUTURE) ×2
SUT VIC AB 2-0 CT1 18 (SUTURE) ×2 IMPLANT
SUT VIC AB 3-0 SH 8-18 (SUTURE) ×3 IMPLANT
TOWEL GREEN STERILE (TOWEL DISPOSABLE) ×2 IMPLANT
TOWEL GREEN STERILE FF (TOWEL DISPOSABLE) ×2 IMPLANT
TRAY FOLEY MTR SLVR 16FR STAT (SET/KITS/TRAYS/PACK) ×2 IMPLANT
WATER STERILE IRR 1000ML POUR (IV SOLUTION) ×2 IMPLANT

## 2022-03-16 NOTE — Progress Notes (Signed)
?PROGRESS NOTE ? ? ? ?Hayley Padilla  TGG:[id number] DOB: May 05, 1951 DOA: 03/11/2022 ?PCP: Fleet Contras, MD ? ? ?Brief Narrative:  ?Hayley Padilla is a 71 y.o. female with medical history significant of hypertension, hyperlipidemia, obesity (BMI 38.04), history of degenerative disc disease, posterior spinal fusion at L4-5 in 2011 and history of revision of fusion at L2-4.  Admitted last month 2/1-2/7 for back pain and progressive numbness and weakness of her right leg.  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  Status post IR disc aspiration on 2/2, culture negative.  Infectious disease was consulted, PICC line placed, and discharged to SNF on IV daptomycin and ertapenem for total duration of 6 weeks of IV antibiotics.  Switched to oral doxycycline and Levaquin by infectious disease on 3/15 and PICC removed.  Seen by ID again yesterday for continued back pain, difficulty walking, and multiple falls over the past few weeks.  Advised to continue oral antibiotics and follow-up for repeat evaluation and lab work next month.  Patient subsequently presented to the ED complaining of worsening back pain after a fall.  No fever or leukocytosis. ? ?Assessment & Plan: ?  ?Principal Problem: ?  Vertebral osteomyelitis ([facility]) ?Active Problems: ?  Edema of right lower extremity ?  Hypokalemia ?  Discitis of lumbar region ?  Essential hypertension ?  HLD (hyperlipidemia) ? ?Vertebral osteomyelitis ([facility]) ?-Admitted last month for back pain and progressive numbness and weakness of her right leg.  MRI revealed discitis-osteomyelitis at L1-2 and epidural phlegmon/abscess.  Status post IR disc aspiration on 2/2, culture negative.  Treated with 6 weeks of IV daptomycin and ertapenem, switch to oral doxycycline and Levaquin on 3/15. ?-Patient has continued  to have back pain, difficulty walking, and multiple falls over the past few weeks. ?-MRI of lumbar spine showing persistent changes of osteomyelitis discitis at L1-2 with similar associated left ventral epidural phlegmon and probable new small posterior component.  Resultant moderate to severe spinal stenosis is stable to perhaps slightly worsened from prior.  No other evidence of new or distant infection within the lumbar spine.  No MRI evidence of fracture or other acute traumatic injury.  Stable postsurgical changes at L2-3 through L5-S1.  ID consulted.  They recommended continuing antibiotics but also recommended another neurosurgery evaluation.  Seen by neurosurgery.  Patient is scheduled for surgical procedure today.  Antibiotics on hold per ID. ?  ?Hypokalemia: Resolved. ?  ?Edema of right lower extremity ?Mild and appreciated during hospitalization last month as well.  She underwent Doppler which was negative for DVT. ?  ?HLD (hyperlipidemia) ?-Continue statin ?  ?Essential hypertension ?Stable. ?-Continue home medications ? ?DVT prophylaxis:  ?  Code Status: Full Code  ?Family Communication:  None present at bedside.  Plan of care discussed with patient in length and he/she verbalized understanding and agreed with it. ? ?Status is: Inpatient ?Remains inpatient appropriate because: Scheduled for surgical procedure with neurosurgeon on Monday. ? ? ? ? ?Estimated body mass index is 38.04 kg/m? as calculated from the following: ?  Height as of this encounter: 5\' 2"  (1.575 m). ?  Weight as of this encounter: 94.3 kg. ? ?  ?Nutritional Assessment: ?Body mass index is 38.04 kg/m? Marland Kitchen ?Seen by dietician.  I agree with the assessment and plan as outlined below: ?Nutrition Status: ?  ?  ?  ? ?. ?  Skin Assessment: ?I have examined the patient's skin and I agree with the wound assessment as performed by the wound care RN as outlined below: ?  ? ?Consultants:  ?ID ?Neurosurgery ?Procedures:  ?None ? ?Antimicrobials:   ?Anti-infectives (From admission, onward)  ? ? Start     Dose/Rate Route Frequency Ordered Stop  ? 03/12/22 1000  levofloxacin (LEVAQUIN) tablet 500 mg  Status:  Discontinued       ? 500 mg Oral Daily 03/12/22 0052 03/12/22 0444  ? 03/12/22 1000  doxycycline (VIBRA-TABS) tablet 100 mg  Status:  Discontinued       ?Note to Pharmacy: Patient taking differently: Bid x 30 days    ? 100 mg Oral 2 times daily 03/12/22 0444 03/13/22 1956  ? 03/12/22 1000  levofloxacin (LEVAQUIN) tablet 500 mg  Status:  Discontinued       ?Note to Pharmacy: Patient taking differently: Qd x 30 days    ? 500 mg Oral Daily 03/12/22 0444 03/13/22 1956  ? 03/12/22 0100  doxycycline (VIBRA-TABS) tablet 100 mg  Status:  Discontinued       ? 100 mg Oral Every 12 hours 03/12/22 0052 03/12/22 0444  ? ?  ?  ? ? ?Subjective: ? ?Seen and examined this morning.  No complaints. ? ?Objective: ?Vitals:  ? 03/15/22 2028 03/16/22 0330 03/16/22 0825 03/16/22 1321  ?BP: (!) 144/72 110/69 117/70 136/75  ?Pulse: 95 85 83   ?Resp: 17 15 16 16   ?Temp: 98.2 ?F (36.8 ?C) 98.6 ?F (37 ?C) 98.3 ?F (36.8 ?C) 97.8 ?F (36.6 ?C)  ?TempSrc: Oral Oral Oral Oral  ?SpO2: 100% 98% 98% 99%  ?Weight:      ?Height:      ? ? ?Intake/Output Summary (Last 24 hours) at 03/16/2022 1440 ?Last data filed at 03/16/2022 0800 ?Gross per 24 hour  ?Intake 440 ml  ?Output --  ?Net 440 ml  ? ? ?Filed Weights  ? 03/11/22 1415  ?Weight: 94.3 kg  ? ? ?Examination: ? ?General exam: Appears calm and comfortable  ?Respiratory system: Clear to auscultation. Respiratory effort normal. ?Cardiovascular system: S1 & S2 heard, RRR. No JVD, murmurs, rubs, gallops or clicks. No pedal edema. ?Gastrointestinal system: Abdomen is nondistended, soft and nontender. No organomegaly or masses felt. Normal bowel sounds heard. ?Central nervous system: Alert and oriented. No focal neurological deficits. ?Extremities: Symmetric 5 x 5 power. ?Skin: No rashes, lesions or ulcers.  ?Psychiatry: Judgement and insight appear  normal. Mood & affect appropriate.  ? ? ?Data Reviewed: I have personally reviewed following labs and imaging studies ? ?CBC: ?Recent Labs  ?Lab 03/10/22 ?1100 03/11/22 ?1836 03/12/22 ?[id number] 03/16/22 ?[id number]  ?WBC 5.8 7.1 6.3 6.2  ?NEUTROABS  --  2.7 3.0 2.4  ?HGB 11.6* 11.7* 10.4* 10.4*  ?HCT 37.4 38.2 32.5* 32.4*  ?MCV 75.3* 74.9* 73.0* 72.8*  ?PLT 325 332 296 298  ? ? ?Basic Metabolic Panel: ?Recent Labs  ?Lab 03/10/22 ?1100 03/11/22 ?1836 03/12/22 ?[id number] 03/16/22 ?[id number]  ?NA 144 142 142 140  ?K 3.6 3.4* 3.7 3.6  ?CL 106 105 107 105  ?CO2 25 27 27 28   ?GLUCOSE 101* 113* 126* 106*  ?BUN 12 9 10 14   ?CREATININE 0.63 0.59 0.66 0.62  ?CALCIUM 9.5 9.6 9.2 9.1  ?MG  --   --  2.0  --   ? ? ?GFR: ?Estimated Creatinine Clearance: 68 mL/min (by C-G formula based on SCr of 0.62 mg/dL). ?Liver Function Tests: ?Recent Labs  ?Lab 03/10/22 ?1100 03/12/22 ?[id number]  ?  AST 15 16  ?ALT 11 14  ?ALKPHOS  --  133*  ?BILITOT 0.3 0.6  ?PROT 6.9 6.0*  ?ALBUMIN  --  3.3*  ? ? ?No results for input(s): LIPASE, AMYLASE in the last 168 hours. ?No results for input(s): AMMONIA in the last 168 hours. ?Coagulation Profile: ?No results for input(s): INR, PROTIME in the last 168 hours. ?Cardiac Enzymes: ?No results for input(s): CKTOTAL, CKMB, CKMBINDEX, TROPONINI in the last 168 hours. ?BNP (last 3 results) ?No results for input(s): PROBNP in the last 8760 hours. ?HbA1C: ?No results for input(s): HGBA1C in the last 72 hours. ?CBG: ?No results for input(s): GLUCAP in the last 168 hours. ?Lipid Profile: ?No results for input(s): CHOL, HDL, LDLCALC, TRIG, CHOLHDL, LDLDIRECT in the last 72 hours. ?Thyroid Function Tests: ?No results for input(s): TSH, T4TOTAL, FREET4, T3FREE, THYROIDAB in the last 72 hours. ?Anemia Panel: ?No results for input(s): VITAMINB12, FOLATE, FERRITIN, TIBC, IRON, RETICCTPCT in the last 72 hours. ?Sepsis Labs: ?No results for input(s): PROCALCITON, LATICACIDVEN in the last 168 hours. ? ?Recent Results (from the past 240 hour(s))   ?Surgical pcr screen     Status: None  ? Collection Time: 03/16/22  5:04 AM  ? Specimen: Nasal Mucosa; Nasal Swab  ?Result Value Ref Range Status  ? MRSA, PCR NEGATIVE NEGATIVE Final  ? Staphylococcus aureus NEG

## 2022-03-16 NOTE — Progress Notes (Signed)
Mobility Specialist Progress Note: ? ? 03/16/22 1331  ?Mobility  ?Activity Off unit  ? ?Will follow-up as time allows.  ? ?Hagan Vanauken ?Mobility Specialist ?Primary Phone [phone] ? ?

## 2022-03-16 NOTE — Brief Op Note (Signed)
03/11/2022 - 03/16/2022 ? ?5:28 PM ? ?PATIENT:  Hayley Padilla  71 y.o. female ? ?PRE-OPERATIVE DIAGNOSIS:  Stenosis ? ?POST-OPERATIVE DIAGNOSIS:  Stenosis ? ?PROCEDURE:  Procedure(s): ?Lumbar One-Two Posterior Lateral Fusion with pedicle screws (N/A) ? ?SURGEON:  Surgeon(s) and Role: ?   Julio Sicks, MD - Primary ? ?PHYSICIAN ASSISTANT:  ? ?ASSISTANTS: Bergman,NP  ? ?ANESTHESIA:   general ? ?EBL:  150 mL  ? ?BLOOD ADMINISTERED:none ? ?DRAINS: (med) Hemovact drain(s) in the deep wound space with  Suction Open  ? ?LOCAL MEDICATIONS USED:  MARCAINE    ? ?SPECIMEN:  No Specimen ? ?DISPOSITION OF SPECIMEN:  N/A ? ?COUNTS:  YES ? ?TOURNIQUET:  * No tourniquets in log * ? ?DICTATION: .Dragon Dictation ? ?PLAN OF CARE: Admit to inpatient  ? ?PATIENT DISPOSITION:  PACU - hemodynamically stable. ?  ?Delay start of Pharmacological VTE agent (>24hrs) due to surgical blood loss or risk of bleeding: yes ? ?

## 2022-03-16 NOTE — Anesthesia Preprocedure Evaluation (Signed)
Anesthesia Evaluation  ?Patient identified by MRN, date of birth, ID band ?Patient awake ? ? ? ?Reviewed: ?Allergy & Precautions, NPO status , Patient's Chart, lab work & pertinent test results ? ?History of Anesthesia Complications ?(+) PONV and history of anesthetic complications ? ?Airway ?Mallampati: II ? ?TM Distance: >3 FB ?Neck ROM: Full ? ? ? Dental ?no notable dental hx. ? ?  ?Pulmonary ?neg pulmonary ROS,  ?  ?Pulmonary exam normal ?breath sounds clear to auscultation ? ? ? ? ? ? Cardiovascular ?hypertension, Pt. on medications ?Normal cardiovascular exam ?Rhythm:Regular Rate:Normal ? ? ?  ?Neuro/Psych ?negative neurological ROS ? negative psych ROS  ? GI/Hepatic ?negative GI ROS, Neg liver ROS,   ?Endo/Other  ?negative endocrine ROS ? Renal/GU ?negative Renal ROS  ?negative genitourinary ?  ?Musculoskeletal ?negative musculoskeletal ROS ?(+)  ? Abdominal ?  ?Peds ?negative pediatric ROS ?(+)  Hematology ? ?(+) Blood dyscrasia, anemia ,   ?Anesthesia Other Findings ? ? Reproductive/Obstetrics ?negative OB ROS ? ?  ? ? ? ? ? ? ? ? ? ? ? ? ? ?  ?  ? ? ? ? ? ? ? ? ?Anesthesia Physical ?Anesthesia Plan ? ?ASA: 2 ? ?Anesthesia Plan: General  ? ?Post-op Pain Management: Dilaudid IV  ? ?Induction: Intravenous ? ?PONV Risk Score and Plan: 4 or greater and Ondansetron, Dexamethasone, Droperidol and Treatment may vary due to age or medical condition ? ?Airway Management Planned: Oral ETT ? ?Additional Equipment:  ? ?Intra-op Plan:  ? ?Post-operative Plan: Extubation in OR ? ?Informed Consent: I have reviewed the patients History and Physical, chart, labs and discussed the procedure including the risks, benefits and alternatives for the proposed anesthesia with the patient or authorized representative who has indicated his/her understanding and acceptance.  ? ? ? ?Dental advisory given ? ?Plan Discussed with: CRNA and Surgeon ? ?Anesthesia Plan Comments:   ? ? ? ? ? ? ?Anesthesia  Quick Evaluation ? ?

## 2022-03-16 NOTE — Progress Notes (Signed)
?   ? ? ? ? ?[facility] ? ?Date of Admission:  03/11/2022    ? ? ?Total days of antibiotics: 2.5 ?       Doxycycline (3/29-3/31) ?       Levofloxacin (3/30-3/31) ?       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 She was then transitioned to oral Doxycycline and Levofloxacin on 02/25/2022 after follow up visit with Dr. Orvan Falconer.  ? ?Re-admitted after developing worsening back pain with MRI evidence of persistent osteomyelitis-discitis at the L1-2. Home oral Doxycycline and Levofloxacin initially started on admission however discontinued on 3/31. ? ?PLAN: ?OR today with Neurosurgery with plan to obtain surgical cultures  ?Anti-microbial therapy pending culture data  ? ?Principal Problem: ?  Vertebral osteomyelitis (HCC) ?Active Problems: ?  Discitis of lumbar region ?  Essential hypertension ?  HLD (hyperlipidemia) ?  Edema of right lower extremity ?  Hypokalemia ? ? amLODipine  10 mg Oral Daily  ? bisacodyl  10 mg Rectal Once  ? cloNIDine  0.1 mg Oral BID  ? diclofenac Sodium  2 g Topical QID  ? docusate sodium  100 mg Oral BID  ? furosemide  40 mg Oral Daily  ? gabapentin  300 mg Oral TID  ? losartan  100 mg Oral Daily  ? polyethylene glycol  17 g Oral Daily  ? pravastatin  40 mg Oral q1800  ? ?SUBJECTIVE: ?Hayley Padilla states she continues to have significant back pain today. She is aware of plans to go to the OR. No questions or concerns today.  ? ?Review of Systems: ?Negative except as noted above.  ? ?Allergies  ?Allergen Reactions  ? Penicillins Anaphylaxis  ?  Did it involve swelling of the face/tongue/throat, SOB, or low BP? Yes ?Did it involve sudden or severe rash/hives, skin peeling, or any reaction on the inside of your mouth or nose? No ?Did you need to seek medical  attention at a hospital or doctor's office? Yes ?When did it last happen?      "Been a While"  ?If all above answers are ?NO?, may proceed with cephalosporin use. ?  ? Aspirin Other (See Comments)  ?  brusing _ patient requested to be listed  ? ?OBJECTIVE: ?Vitals:  ? 03/15/22 1536 03/15/22 2028 03/16/22 0330 03/16/22 0825  ?BP: 137/66 (!) 144/72 110/69 117/70  ?Pulse: 85 95 85 83  ?Resp: 18 17 15 16   ?Temp: 97.7 ?F (36.5 ?C) 98.2 ?F (36.8 ?C) 98.6 ?F (37 ?C) 98.3 ?F (36.8 ?C)  ?TempSrc:  Oral Oral Oral  ?SpO2: 99% 100% 98% 98%  ?Weight:      ?Height:      ? ?Body mass index is 38.04 kg/m?. ? ?Physical Exam ?Vitals and nursing note reviewed.  ?Constitutional:   ?   General: She is not in acute distress. ?   Appearance: She is normal weight.  ?HENT:  ?   Head: Normocephalic and atraumatic.  ?Eyes:  ?   Conjunctiva/sclera: Conjunctivae normal.  ?   Pupils: Pupils are equal, round, and reactive to light.  ?Cardiovascular:  ?   Rate and Rhythm: Normal rate and regular rhythm.  ?Pulmonary:  ?   Effort: Pulmonary effort is normal. No respiratory distress.  ?Abdominal:  ?   General: Bowel sounds  are normal.  ?   Palpations: Abdomen is soft.  ?Skin: ?   General: Skin is warm and dry.  ?Neurological:  ?   Mental Status: She is alert and oriented to person, place, and time.  ?Psychiatric:     ?   Mood and Affect: Mood normal.     ?   Behavior: Behavior normal.     ?   Thought Content: Thought content normal.     ?   Judgment: Judgment normal.  ? ?Lab Results ?Lab Results  ?Component Value Date  ? WBC 6.2 03/16/2022  ? HGB 10.4 (L) 03/16/2022  ? HCT 32.4 (L) 03/16/2022  ? MCV 72.8 (L) 03/16/2022  ? PLT 298 03/16/2022  ?  ?Lab Results  ?Component Value Date  ? CREATININE 0.62 03/16/2022  ? BUN 14 03/16/2022  ? NA 140 03/16/2022  ? K 3.6 03/16/2022  ? CL 105 03/16/2022  ? CO2 28 03/16/2022  ?  ?Lab Results  ?Component Value Date  ? ALT 14 03/12/2022  ? AST 16 03/12/2022  ? ALKPHOS 133 (H) 03/12/2022  ? BILITOT 0.6 03/12/2022  ?   ?Microbiology: ?Recent Results (from the past 240 hour(s))  ?Surgical pcr screen     Status: None  ? Collection Time: 03/16/22  5:04 AM  ? Specimen: Nasal Mucosa; Nasal Swab  ?Result Value Ref Range Status  ? MRSA, PCR NEGATIVE NEGATIVE Final  ? Staphylococcus aureus NEGATIVE NEGATIVE Final  ?  Comment: (NOTE) ?The Xpert SA Assay (FDA approved for NASAL specimens in patients 21 ?years of age and older), is one component of a comprehensive ?surveillance program. It is not intended to diagnose infection nor to ?guide or monitor treatment. ?Performed at [facility], [address], [place], [place] ?[address] ?  ? ?Dr. Verdene Lennert ?Internal Medicine PGY-3  ?03/16/2022, 12:13 PM ? ?

## 2022-03-16 NOTE — TOC Progression Note (Signed)
Transition of Care (TOC) - Progression Note  ? ? ?Patient Details  ?Name: Hayley Padilla ?MRN: [id number] ?Date of Birth: 10/17/1951 ? ?Transition of Care (TOC) CM/SW Contact  ?Coralee Pesa, LCSWA ?Phone Number: ?03/16/2022, 10:08 AM ? ?Clinical Narrative:    ?CSW met with pt at bedside to follow up on conversation on bed choice for SNF. Pt noted she is still leaning toward [facility], but is waiting for daughter to [place] before making a decision. Pt has a procedure this afternoon and is not medically ready at this time. CSW will follow up tomorrow for final bed choice. ? ? ?Expected Discharge Plan: [place] ?Barriers to Discharge: Continued Medical Work up ? ?Expected Discharge Plan and Services ?Expected Discharge Plan: [place] ?  ?  ?  ?Living arrangements for the past 2 months: [place] ?                ?  ?  ?  ?  ?  ?  ?  ?  ?  ?  ? ? ?Social Determinants of Health (SDOH) Interventions ?  ? ?Readmission Risk Interventions ?   ? View : No data to display.  ?  ?  ?  ? ? ?

## 2022-03-16 NOTE — Progress Notes (Signed)
Pharmacy Antibiotic Note ? ?Hayley Padilla is a 71 y.o. female admitted on 03/11/2022 s/p spinal procedure.  Pharmacy has been consulted for vancomycin dosing for surgical prophylaxis. ?-1000mg  IV vancomycin given ~ 3pm ?-hemovac drain in place ?-SCr= 0.62 ? ?Plan: ?-Vancomycin 500mg  IV q12h (estimated AUC= 481 using SCr= 0.8) ?-Will follow renal function,  and plans for drain removal ? ? ?Height: 5\' 2"  (157.5 cm) ?Weight: 94.3 kg (208 lb) ?IBW/kg (Calculated) : 50.1 ? ?Temp (24hrs), Avg:98 ?F (36.7 ?C), Min:97.3 ?F (36.3 ?C), Max:98.6 ?F (37 ?C) ? ?Recent Labs  ?Lab 03/10/22 ?1100 03/11/22 ?1836 03/12/22 ?03/12/22 03/16/22 ?03/14/22  ?WBC 5.8 7.1 6.3 6.2  ?CREATININE 0.63 0.59 0.66 0.62  ?  ?Estimated Creatinine Clearance: 68 mL/min (by C-G formula based on SCr of 0.62 mg/dL).   ? ?Allergies  ?Allergen Reactions  ? Penicillins Anaphylaxis  ?  Did it involve swelling of the face/tongue/throat, SOB, or low BP? Yes ?Did it involve sudden or severe rash/hives, skin peeling, or any reaction on the inside of your mouth or nose? No ?Did you need to seek medical attention at a hospital or doctor's office? Yes ?When did it last happen?      "Been a While"  ?If all above answers are ?NO?, may proceed with cephalosporin use. ?  ? Aspirin Other (See Comments)  ?  brusing _ patient requested to be listed  ? ? ?Thank you for allowing pharmacy to be a part of this patient?s care. ? ?4010, PharmD ?Clinical Pharmacist ?**Pharmacist phone directory can now be found on [email or web address] ([email or web address]).  Listed under [facility]. ? ? ?

## 2022-03-16 NOTE — Care Management Important Message (Signed)
Important Message ? ?Patient Details  ?Name: Hayley Padilla ?MRN: [id number] ?Date of Birth: 1951/05/01 ? ? ?Medicare Important Message Given:  Yes ? ? ? ? ?Levada Dy  Calhoun Reichardt-Martin ?03/16/2022, 3:40 PM ?

## 2022-03-16 NOTE — Op Note (Signed)
Date of procedure: 03/16/2022 ? ?Date of dictation: Same ? ?Service: Neurosurgery ? 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 ?Postoperative diagnosis: Same ? ?Procedure Name: L1-L2 posterior lateral arthrodesis utilizing segmental instrumentation L1-L4 with local autograft, morselized allograft, and bone neurogenic protein ? ?Surgeon:Juanisha Bautch A.Tifanny Dollens, M.D. ? ?Asst. Surgeon: Reinaldo Meeker, NP ? ?Anesthesia: General ? ?Indication: 71 year old female remotely status post L2-3 for interbody fusion and very remotely status post L4-5 fusion presents with a long course worrisome for L1-L2 osteomyelitis discitis.  She has been adequately treated medically.  She continues to have severe back pain.  She has evidence of instability with retrolisthesis of L1 on L2.  Remainder of her spine appears intact.  Patient presents now for fusion at L1-L2. ? ?Operative note: After induction of anesthesia, patient position prone onto Wilson frame and properly padded.  Lumbar region prepped and draped sterilely.  Incision made overlying L1-L4.  Dissection performed bilaterally.  Previously placed pedicle screws Tatian at L2, L3 and L4 were dissected free.  The rod construct was disassembled.  The rod was removed.  The fusion at L2-3 and L3-4 was inspected and found to be solid.  The L1-2 space was explored.  There is no evidence of any infection.  Pedicle entry sites at L1 were obtained bilaterally using surface landmarks and intraoperative fluoroscopy.  Superficial bone overlying the pedicle was then removed using high-speed drill.  Pedicle was then probed using a pedicle all each pedicle tract was then probed and found to be solidly within the bone.  6.5 mm x 45 mm Solera screws by [employer] were placed bilaterally at L1.  Titanium rod was then contoured and placed over the screw heads at L1, L2, L3 and L4.  Locking caps placed over the screws and locking caps then engaged reducing the  patient's deformity at L1-L2.  Transverse processes and residual lamina were decorticated.  Morselized autograft, bone morphogenic protein soaked sponges and allograft packets were placed over the decorticated bone.  Vancomycin powder was placed in the deep wound space.  Wounds and closed in layers with Vicryl sutures.  Steri-Strips and sterile dressing were applied.  No apparent complications.  Patient tolerated the procedure well and she returns to the recovery room postop. ? ?

## 2022-03-16 NOTE — Transfer of Care (Signed)
Immediate Anesthesia Transfer of Care Note ? ?Patient: Hayley Padilla ? ?Procedure(s) Performed: Lumbar One-Two Posterior Lateral Fusion with pedicle screws (Back) ? ?Patient Location: PACU ? ?Anesthesia Type:General ? ?Level of Consciousness: drowsy ? ?Airway & Oxygen Therapy: Patient Spontanous Breathing and Patient connected to face mask oxygen ? ?Post-op Assessment: Report given to RN, Post -op Vital signs reviewed and stable and Patient moving all extremities X 4 ? ?Post vital signs: Reviewed and stable ? ?Last Vitals:  ?Vitals Value Taken Time  ?BP 102/74 03/16/22 1746  ?Temp    ?Pulse 77 03/16/22 1751  ?Resp 15 03/16/22 1751  ?SpO2 100 % 03/16/22 1751  ?Vitals shown include unvalidated device data. ? ?Last Pain:  ?Vitals:  ? 03/16/22 1321  ?TempSrc: Oral  ?PainSc: 0-No pain  ?   ? ?Patients Stated Pain Goal: 0 (03/16/22 1321) ? ?Complications: No notable events documented. ?

## 2022-03-16 NOTE — Progress Notes (Signed)
PT Cancellation Note ? ?Patient Details ?Name: NASIYAH LAVERDIERE ?MRN: [id number] ?DOB: [birth year] ? ? ?Cancelled Treatment:    Reason Eval/Treat Not Completed: Patient at procedure or test/unavailable, pt off unit. Will re-attempt tomorrow to continue with PT POC. ? ? ? ?Marlana Salvage Malaina Mortellaro ?03/16/2022, 1:51 PM ? ? ?

## 2022-03-16 NOTE — Interval H&P Note (Signed)
History and Physical Interval Note: ? ?03/16/2022 ?2:56 PM ? ?Hayley Padilla  has presented today for surgery, with the diagnosis of Stenosis.  The various methods of treatment have been discussed with the patient and family. After consideration of risks, benefits and other options for treatment, the patient has consented to  Procedure(s): ?L1-2 Posterior Lateral Fusion w/pedicle screws (N/A) as a surgical intervention.  The patient's history has been reviewed, patient examined, no change in status, stable for surgery.  I have reviewed the patient's chart and labs.  Questions were answered to the patient's satisfaction.   ? ? ?Sherilyn Cooter A Dorman [place]wood ? ? ?

## 2022-03-16 NOTE — Progress Notes (Signed)
Orthopedic Tech Progress Note ?Patient Details:  ?SUE FERNICOLA ?1951-05-06 ?[id number] ? ?PACU RN called requesting a LSO BACK BRACE  ? ?Ortho Devices ?Type of Ortho Device: Lumbar corsett ?Ortho Device/Splint Location: BACK ?Ortho Device/Splint Interventions: Ordered ?  ?Post Interventions ?Patient Tolerated: Well ?Instructions Provided: Care of device ? ?Donald Pore ?03/16/2022, 6:30 PM ? ?

## 2022-03-16 NOTE — Anesthesia Procedure Notes (Signed)
Procedure Name: Intubation ?Date/Time: 03/16/2022 3:15 PM ?Performed by: Reece Agar, CRNA ?Pre-anesthesia Checklist: Patient identified, Emergency Drugs available, Suction available and Patient being monitored ?Patient Re-evaluated:Patient Re-evaluated prior to induction ?Oxygen Delivery Method: Circle System Utilized ?Preoxygenation: Pre-oxygenation with 100% oxygen ?Induction Type: IV induction ?Ventilation: Mask ventilation without difficulty ?Laryngoscope Size: Mac and 3 ?Grade View: Grade I ?Tube type: Oral ?Tube size: 7.0 mm ?Number of attempts: 1 ?Airway Equipment and Method: Stylet ?Placement Confirmation: ETT inserted through vocal cords under direct vision, positive ETCO2 and breath sounds checked- equal and bilateral ?Secured at: 22 cm ?Tube secured with: Tape ?Dental Injury: Teeth and Oropharynx as per pre-operative assessment  ? ? ? ? ?

## 2022-03-17 DIAGNOSIS — I10 Essential (primary) hypertension: Secondary | ICD-10-CM

## 2022-03-17 DIAGNOSIS — E876 Hypokalemia: Secondary | ICD-10-CM

## 2022-03-17 DIAGNOSIS — R600 Localized edema: Secondary | ICD-10-CM

## 2022-03-17 DIAGNOSIS — E785 Hyperlipidemia, unspecified: Secondary | ICD-10-CM

## 2022-03-17 DIAGNOSIS — M4646 Discitis, unspecified, lumbar region: Secondary | ICD-10-CM

## 2022-03-17 NOTE — Progress Notes (Signed)
Physical Therapy Treatment ?Patient Details ?Name: Hayley Padilla ?MRN: [id number] ?DOB: 09-29-1951 ?Today's Date: 03/17/2022 ? ? ?History of Present Illness Pt is a 71 y.o. female admitted 03/11/22 after fall at home with back pain. 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 S/p L1-L2 posterior lateral arthrodesis 4/3. PMH includes HTN, HLD, DDD, L4-5 fusion (2011), recent admission 01/2022 with similar issue of L1-2 osteomyelitis. ?  ?PT Comments  ? ? Pt now s/p lumbar sx 4/3; new LSO adjusted and donned. Today's session focused on sit<>stand trials with RW, pt requiring modA to stand; mobility progression limited by c/o significant pain. Pt remains limited by generalized weakness, decreased activity tolerance, and impaired balance strategies/postural reactions. Continue to recommend SNF-level therapies to maximize functional mobility and independence prior to return home. ?   ?Recommendations for follow up therapy are one component of a multi-disciplinary discharge planning process, led by the attending physician.  Recommendations may be updated based on patient status, additional functional criteria and insurance authorization. ? ?Follow Up Recommendations ? Skilled nursing-short term rehab (<3 hours/day) ?  ?  ?Assistance Recommended at Discharge Frequent or constant Supervision/Assistance  ?Patient can return home with the following Assistance with cooking/housework;Assist for transportation;Help with stairs or ramp for entrance;A lot of help with walking and/or transfers;A lot of help with bathing/dressing/bathroom ?  ?Equipment Recommendations ? None recommended by PT  ?  ?Recommendations for Other Services   ? ? ?  ?Precautions / Restrictions Precautions ?Precautions: Fall;Back;Other (comment) ?Precaution Comments: hemovac lumbar incision ?Required Braces or Orthoses: Spinal Brace ?Spinal Brace: Lumbar  corset;Applied in sitting position ?Restrictions ?Weight Bearing Restrictions: No  ?  ? ?Mobility ? Bed Mobility ?  ?  ?  ?  ?  ?  ?  ?General bed mobility comments: Received sitting in recliner ?  ? ?Transfers ?Overall transfer level: Needs assistance ?Equipment used: Rolling walker (2 wheels) ?Transfers: Sit to/from Stand ?Sit to Stand: Min assist, Mod assist ?  ?  ?  ?  ?  ?General transfer comment: pt performing 3x sit<>stand from recliner to RW, good ability to offload buttocks, requiring min-modA to maintain upright with transition of UE support from recliner armrests to RW; pt standing ~2-3 sec with LE instability (suspect more pain related) before requiring return to sit ?  ? ?Ambulation/Gait ?  ?  ?  ?  ?  ?  ?  ?General Gait Details:  (unable due to pain) ? ? ?Stairs ?  ?  ?  ?  ?  ? ? ?Wheelchair Mobility ?  ? ?Modified Rankin (Stroke Patients Only) ?  ? ? ?  ?Balance Overall balance assessment: Needs assistance ?Sitting-balance support: No upper extremity supported, Feet supported ?Sitting balance-Leahy Scale: Fair ?  ?  ?  ?  ?  ?  ?  ?  ?  ?  ?  ?  ?  ?  ?  ?  ?  ? ?  ?Cognition Arousal/Alertness: Awake/alert ?Behavior During Therapy: [facility] for tasks assessed/performed ?  ?Area of Impairment: Safety/judgement, Awareness, Memory, Problem solving ?  ?  ?  ?  ?  ?  ?  ?  ?  ?  ?Memory: Decreased short-term memory ?  ?Safety/Judgement: Decreased awareness of deficits ?Awareness: Emergent ?Problem Solving: Requires verbal cues ?General Comments: poor recall of back precautions; suspect decreased insight (pt unable to maintain standing despite 3x attempts, pt reports unable due to pain, then  asks PT, "you're leaving, aren't we supposed to walk?") ?  ?  ? ?  ?Exercises   ? ?  ?General Comments General comments (skin integrity, edema, etc.): pt's new LSO adjusted to her size, donned for standing trials. SW in during session to discuss SNF options for likely d/c tomorrow ?  ?  ? ?Pertinent Vitals/Pain Pain  Assessment ?Pain Assessment: Faces ?Faces Pain Scale: Hurts even more ?Pain Location: back, RLE ?Pain Descriptors / Indicators: Discomfort, Grimacing, Guarding ?Pain Intervention(s): Limited activity within patient's tolerance, Monitored during session, Repositioned  ? ? ?Home Living   ?  ?  ?  ?  ?  ?  ?  ?  ?  ?   ?  ?Prior Function    ?  ?  ?   ? ?PT Goals (current goals can now be found in the care plan section) Progress towards PT goals: Progressing toward goals (slowly, now post-op) ? ?  ?Frequency ? ? ? Min 3X/week ? ? ? ?  ?PT Plan Current plan remains appropriate  ? ? ?Co-evaluation   ?  ?  ?  ?  ? ?  ?AM-PAC PT "6 Clicks" Mobility   ?Outcome Measure ? Help needed turning from your back to your side while in a flat bed without using bedrails?: A Little ?Help needed moving from lying on your back to sitting on the side of a flat bed without using bedrails?: A Little ?Help needed moving to and from a bed to a chair (including a wheelchair)?: A Lot ?Help needed standing up from a chair using your arms (e.g., wheelchair or bedside chair)?: A Lot ?Help needed to walk in hospital room?: Total ?Help needed climbing 3-5 steps with a railing? : Total ?6 Click Score: 12 ? ?  ?End of Session Equipment Utilized During Treatment: Back brace ?Activity Tolerance: Patient limited by pain ?Patient left: in chair;with call bell/phone within reach;with chair alarm set ?Nurse Communication: Mobility status ?PT Visit Diagnosis: Unsteadiness on feet (R26.81);Other abnormalities of gait and mobility (R26.89);Muscle weakness (generalized) (M62.81);History of falling (Z91.81);Difficulty in walking, not elsewhere classified (R26.2);Pain ?Pain - Right/Left: Right ?Pain - part of body: Leg;Hip ?  ? ? ?Time: [phone] ?PT Time Calculation (min) (ACUTE ONLY): 18 min ? ?Charges:  $Therapeutic Activity: 8-22 mins          ?          ? ?Ina Homes, PT, DPT ?Acute Rehabilitation Services  ?Pager [phone] ?Office  [phone] ? ?Malachy Chamber ?03/17/2022, 10:04 AM ? ?

## 2022-03-17 NOTE — Progress Notes (Signed)
? ?  Providing Compassionate, Quality Care - Together ? ? ?Subjective: ?Patient reports pain when standing and walking. She vomited this morning, but reports her nausea has resolved. ? ?Objective: ?Vital signs in last 24 hours: ?Temp:  [97.3 ?F (36.3 ?C)-100 ?F (37.8 ?C)] 100 ?F (37.8 ?C) (04/04 0754) ?Pulse Rate:  [74-110] 105 (04/04 0516) ?Resp:  [10-19] 18 (04/04 0754) ?BP: (96-164)/(52-83) 145/78 (04/04 0754) ?SpO2:  [93 %-100 %] 100 % (04/04 0754) ? ?Intake/Output from previous day: ?04/03 0701 - 04/04 0700 ?In: 1100 [I.V.:800; IV Piggyback:300] ?Out: [id number] [Urine:800; Drains:15; Blood:150] ?Intake/Output this shift: ?Total I/O ?In: 220 [P.O.:220] ?Out: -  ? ?She is alert and oriented to person, place, and time.  ?Cranial Nerves: Cranial nerves 2-12 are intact.   ?Patient with chronic weakness of her RLE. Decreased sensation. R dorsiflexion 2/5. R plantar flexion 3/5. R hip flexion 3/5. ?Wound is covered with Honeycomb dressing. Dressing is clean, dry, and intact. ?  ?Strength and sensation intact BUE and LLE.  ? ?Lab Results: ?Recent Labs  ?  03/16/22 ?[id number]  ?WBC 6.2  ?HGB 10.4*  ?HCT 32.4*  ?PLT 298  ? ?BMET ?Recent Labs  ?  03/16/22 ?[id number]  ?NA 140  ?K 3.6  ?CL 105  ?CO2 28  ?GLUCOSE 106*  ?BUN 14  ?CREATININE 0.62  ?CALCIUM 9.1  ? ? ?Studies/Results: ?DG Lumbar Spine 2-3 Views ? ?Result Date: 03/16/2022 ?CLINICAL DATA:  Lumbar posterior fusion. EXAM: LUMBAR SPINE - 2-3 VIEW COMPARISON:  Lumbar spine x-ray 11/29/2019. MRI lumbar spine 03/11/2022. FINDINGS: Intraoperative lumbar spine. 2 low resolution intraoperative spot views of the lumbar spine were obtained. L2-L4 posterior fusion hardware is partially visualized. Total fluoroscopy time: 10 seconds Total radiation dose: 5.1 cm micro Gy IMPRESSION: Intraoperative lumbar posterior fusion. Electronically Signed   By: Ronney Asters M.D.   On: 03/16/2022 17:10  ? ?DG C-Arm 1-60 Min-No Report ? ?Result Date: 03/16/2022 ?Fluoroscopy was utilized by the requesting  physician.  No radiographic interpretation.   ? ?Assessment/Plan: ?Patient with significant degenerative changes at L1-2 related to an aseptic osteomyelitis-discitis. Patient underwent L1-2 PLIF with extension of her posterior lateral hardware from L1 to L4 by Dr. Annette Stable on 03/16/2022. ? ? LOS: 4 days  ? ?-PT recommending SNF at discharge ?-Continue efforts at mobilization ? ? ?Viona Gilmore, DNP, AGNP-C ?Nurse Practitioner ? ?[facility] ?[address], [place], [place] [address] ?PRN:[id number]    FTJ:[id number] ? ?03/17/2022, 11:21 AM ? ? ? ? ?

## 2022-03-17 NOTE — TOC Progression Note (Addendum)
Transition of Care (TOC) - Progression Note  ? ? ?Patient Details  ?Name: Hayley Padilla ?MRN: [id number] ?Date of Birth: 06-05-1951 ? ?Transition of Care (TOC) CM/SW Contact  ?Coralee Pesa, LCSWA ?Phone Number: ?03/17/2022, 11:34 AM ? ?Clinical Narrative:    ?2:30 Auth started and [facility] can accept pt tomorrow. ? ?12:00  ?CSW spoke with both facilities. Both copays will be the same. [facility] and [facility] has private rooms, both available tomorrow. Pt notes she would like to stay with [facility], as she is familiar and would like to have a private room. Pt updated her dtr, and dtr reached out to [facility]. She is upset that she ill not go to this other facility as dtr has concerns about [facility]. CSW provided empathetic listening but noted it is ultimately pt's choice and dtr noted understanding. She asked if CSW could see if [facility] private beds, but states she is going to step back from the situation. CSW left a VM for [place]. CSW updated [facility] and will start auth. TOC will continue to follow for DC needs. ? ? ?CSW met with pt at bedside to get bed choice. Pt is still noting she is leaning towards [facility], but would like CSW to call her daughter. CSW spoke with [place] and noted that pt had 2 more bed offers. Dtr is interested in [place] and after discussing with her mother they would like to know if there is a difference in co pays and bed availability before making a final decision. CSW reached out to both facilities and is waiting on a call back to answer families questions. TOC will continue to follow for bed choice and insurance authorization. ? ? ?Expected Discharge Plan: [place] ?Barriers to Discharge: Continued Medical Work up ? ?Expected Discharge Plan and Services ?Expected Discharge Plan: [facility] ?  ?  ?  ?Living arrangements for the past 2 months: [place] ?                ?  ?  ?  ?  ?  ?  ?  ?  ?  ?  ? ? ?Social Determinants of Health (SDOH) Interventions ?   ? ?Readmission Risk Interventions ?   ? View : No data to display.  ?  ?  ?  ? ? ?

## 2022-03-17 NOTE — Plan of Care (Signed)
?  Problem: Education: ?Goal: Knowledge of General Education information will improve ?Description: Including pain rating scale, medication(s)/side effects and non-pharmacologic comfort measures ?Outcome: Progressing ?  ?Problem: Health Behavior/Discharge Planning: ?Goal: Ability to manage health-related needs will improve ?Outcome: Not Progressing ?  ?Problem: Clinical Measurements: ?Goal: Respiratory complications will improve ?Outcome: Progressing ?  ?Problem: Nutrition: ?Goal: Adequate nutrition will be maintained ?Outcome: Progressing ?  ?Problem: Coping: ?Goal: Level of anxiety will decrease ?Outcome: Progressing ?  ?

## 2022-03-17 NOTE — Progress Notes (Signed)
?Progress Note ? ?Patient: Hayley Padilla BHA:[id number] DOB: 1951-12-08  ?DOA: 03/11/2022  DOS: 03/17/2022  ?  ?Brief hospital course: ?Hayley Padilla is a 71 y.o. female with medical history significant of hypertension, hyperlipidemia, obesity (BMI 38.04), history of degenerative disc disease, posterior spinal fusion at L4-5 in 2011 and history of revision of fusion at L2-4.  Admitted last month 2/1-2/7 for back pain and progressive numbness and weakness of her right leg.  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  Status post IR disc aspiration on 2/2, culture negative.  Infectious disease was consulted, PICC line placed, and discharged to SNF on IV daptomycin and ertapenem for total duration of 6 weeks of IV antibiotics.  Switched to oral doxycycline and Levaquin by infectious disease on 3/15 and PICC removed.  Seen by ID again yesterday for continued back pain, difficulty walking, and multiple falls over the past few weeks.  Advised to continue oral antibiotics and follow-up for repeat evaluation and lab work next month.  Patient subsequently presented to the ED complaining of worsening back pain after a fall.  No fever or leukocytosis. ? ?Assessment and Plan: ?Vertebral osteomyelitis (HCC) ?-Admitted last month for back pain and progressive numbness and weakness of her right leg.  MRI revealed discitis-osteomyelitis at L1-2 and epidural phlegmon/abscess.  Status post IR disc aspiration on 2/2, culture negative.  Treated with 6 weeks of IV daptomycin and ertapenem, switch to oral doxycycline and Levaquin on 3/15. ?-Patient has continued to have back pain, difficulty walking, and multiple falls over the past few weeks. ?-MRI of lumbar spine showing persistent changes of osteomyelitis discitis at L1-2 with similar associated left  ventral epidural phlegmon and probable new small posterior component.  Resultant moderate to severe spinal stenosis is stable to perhaps slightly worsened from prior.  No other evidence of new or distant infection within the lumbar spine.  No MRI evidence of fracture or other acute traumatic injury.  Stable postsurgical changes at L2-3 through L5-S1.   ?- ID consulted, after L1-2 fusion 4/3 there is reportedly low concern for infection and extending duration of antibiotics is not recommended at this time. ID follow up with Dr. Orvan Falconer has been arranged in the next week.  ?- s/p L1-2 fusion 4/3 by Dr. Jordan Likes. Continue PT/OT.  Will need SNF which is being arranged per CSW. ?  ?Hypokalemia: Resolved. ?  ?Edema of right lower extremity: Mild and appreciated during hospitalization last month as well.  She underwent Doppler which was negative for DVT. ?  ?HLD: ?-Continue statin ?  ?Essential hypertension: Stable ?-Continue home medications ? ?Obesity: Estimated body mass index is 38.04 kg/m? as calculated from the following: ?  Height as of this encounter: 5\' 2"  (1.575 m). ?  Weight as of this encounter: 94.3 kg. ? ?Subjective: Pain is controlled, but mod-severe with ambulation. No bleeding or fevers. Feels generally well/improved. ? ?Objective: ?Vitals:  ? 03/17/22 0004 03/17/22 05/17/22 03/17/22 05/17/22 03/17/22 0754  ?BP: 123/74 (!) (P) 164/83 (!) 164/83 (!) 145/78  ?Pulse: (!) 104 (!) (P) 110 (!) 105   ?Resp: 17 (P) 17 18 18   ?Temp: 98.6 ?F (37 ?C) (P) 100 ?F (37.8 ?C) 100 ?F (37.8 ?C) 100 ?F (37.8 ?C)  ?TempSrc: Oral (P) Oral Oral Oral  ?SpO2: 97% (P) 100% 97% 100%  ?Weight:      ?  Height:      ? ?Gen: Pleasant 71 y.o. female in no distress ?Pulm: Nonlabored breathing room air. Clear ?CV: Regular rate and rhythm. No murmur, rub, or gallop. No JVD, R > L LE mild dependent edema. ?GI: Abdomen soft, non-tender, non-distended, with normoactive bowel sounds.  ?Ext: Warm, no deformities ?Skin: No rashes, lesions or ulcers on  visualized skin. Surgical wound with well-apposed edges, no exudate thru honeycomb. ?Neuro: Alert and oriented. Decreased sensation, strength RLE, no acute focal neurological deficits. ?Psych: Judgement and insight appear fair. Mood euthymic & affect congruent. Behavior is appropriate.   ? ?Data Personally reviewed: ?MR L spine 3/30 Persistent changes of osteomyelitis discitis at L1-2 with similar ?associated left ventral epidural phlegmon, and probable new small ?posterior component as above. 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 ? ?Family Communication: None at bedside ? ?Disposition: ?Status is: Inpatient ?Remains inpatient appropriate because: Unsafe DC ?Planned Discharge Destination: Skilled nursing facility ? ? ? ? ? ?Tyrone Nine, MD ?03/17/2022 2:57 PM ?Page by [email or web address]  ?

## 2022-03-17 NOTE — Progress Notes (Signed)
Occupational Therapy Treatment ?Patient Details ?Name: Hayley Padilla ?MRN: [id number] ?DOB: 12/23/1950 ?Today's Date: 03/17/2022 ? ? ?History of present illness Pt is a 71 y.o. female admitted 03/11/22 after fall at home with back pain. 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 S/p L1-L2 posterior lateral arthrodesis 4/3. PMH includes HTN, HLD, DDD, L4-5 fusion (2011), recent admission 01/2022 with similar issue of L1-2 osteomyelitis. ?  ?OT comments ? Patient sitting in recliner upon entry, agreeable to OT session. S/P back surgery 4/3.  Pt requires mod assist for transfers, SPT to [facility].  Slow and guarded, with cueing for hand placement, posture and safety.  Pt with poor recall of back precautions, slow processing and decreased awareness. Total assist for LB ADLs. Will follow acutely. Plan for SNF tomorrow.   ? ?Recommendations for follow up therapy are one component of a multi-disciplinary discharge planning process, led by the attending physician.  Recommendations may be updated based on patient status, additional functional criteria and insurance authorization. ?   ?Follow Up Recommendations ? Skilled nursing-short term rehab (<3 hours/day)  ?  ?Assistance Recommended at Discharge Frequent or constant Supervision/Assistance  ?Patient can return home with the following ? A little help with walking and/or transfers;A little help with bathing/dressing/bathroom;Assistance with cooking/housework;Assist for transportation;Help with stairs or ramp for entrance;Direct supervision/assist for medications management;Direct supervision/assist for financial management ?  ?Equipment Recommendations ? Other (comment) (defer)  ?  ?Recommendations for Other Services   ? ?  ?Precautions / Restrictions Precautions ?Precautions: Fall;Back;Other (comment) ?Precaution Booklet Issued: No ?Precaution Comments: hemovac lumbar  incision ?Required Braces or Orthoses: Spinal Brace ?Spinal Brace: Lumbar corset;Applied in sitting position ?Restrictions ?Weight Bearing Restrictions: No  ? ? ?  ? ?Mobility Bed Mobility ?  ?  ?  ?  ?  ?  ?  ?General bed mobility comments: Received sitting in recliner ?  ? ?Transfers ?  ?  ?  ?  ?  ?  ?  ?  ?  ?  ?  ?  ?Balance Overall balance assessment: Needs assistance ?Sitting-balance support: No upper extremity supported, Feet supported ?Sitting balance-Leahy Scale: Fair ?  ?  ?Standing balance support: Bilateral upper extremity supported, During functional activity ?Standing balance-Leahy Scale: Poor ?Standing balance comment: relies on BUE and external support ?  ?  ?  ?  ?  ?  ?  ?  ?  ?  ?  ?   ? ?ADL either performed or assessed with clinical judgement  ? ?ADL Overall ADL's : Needs assistance/impaired ?  ?  ?Grooming: Set up;Sitting ?  ?  ?  ?  ?  ?  ?  ?Lower Body Dressing: Total assistance;Sit to/from stand ?  ?Toilet Transfer: Moderate assistance;Stand-pivot;Rolling walker (2 wheels);BSC/3in1 ?  ?Toileting- Clothing Manipulation and Hygiene: Moderate assistance;Sit to/from stand ?  ?  ?  ?Functional mobility during ADLs: Moderate assistance;Rolling walker (2 wheels);Cueing for safety;Cueing for sequencing ?General ADL Comments: pivot to/from [facility], slow and requires cueing for posture/technique throughout. ?  ? ?Extremity/Trunk Assessment   ?  ?  ?  ?  ?  ? ?Vision   ?  ?  ?Perception   ?  ?Praxis   ?  ? ?Cognition Arousal/Alertness: Awake/alert ?Behavior During Therapy: [facility] for tasks assessed/performed ?Overall Cognitive Status: Impaired/Different from baseline ?Area of Impairment: Safety/judgement, Problem solving, Memory ?  ?  ?  ?  ?  ?  ?  ?  ?  ?  ?  Memory: Decreased short-term memory ?  ?Safety/Judgement: Decreased awareness of safety, Decreased awareness of deficits ?Awareness: Emergent ?Problem Solving: Slow processing, Requires verbal cues ?General Comments: poor recall of back preacutions,  cueing for problem solving and awareness ?  ?  ?   ?Exercises   ? ?  ?Shoulder Instructions   ? ? ?  ?General Comments LSO adjusted, pt had removed anterior plate and therapist educated placement.  ? ? ?Pertinent Vitals/ Pain       Pain Assessment ?Pain Assessment: Faces ?Faces Pain Scale: Hurts even more ?Pain Location: back, RLE ?Pain Descriptors / Indicators: Discomfort, Grimacing, Guarding ?Pain Intervention(s): Limited activity within patient's tolerance, Monitored during session, Repositioned ? ?Home Living   ?  ?  ?  ?  ?  ?  ?  ?  ?  ?  ?  ?  ?  ?  ?  ?  ?  ?  ? ?  ?Prior Functioning/Environment    ?  ?  ?  ?   ? ?Frequency ? Min 2X/week  ? ? ? ? ?  ?Progress Toward Goals ? ?OT Goals(current goals can now be found in the care plan section) ? Progress towards OT goals: Progressing toward goals ? ?Acute Rehab OT Goals ?Patient Stated Goal: get to rehab ?OT Goal Formulation: With patient ?Time For Goal Achievement: 03/26/22 ?Potential to Achieve Goals: Good  ?Plan Discharge plan remains appropriate;Frequency remains appropriate   ? ?Co-evaluation ? ? ?   ?  ?  ?  ?  ? ?  ?AM-PAC OT "6 Clicks" Daily Activity     ?Outcome Measure ? ? Help from another person eating meals?: None ?Help from another person taking care of personal grooming?: A Little ?Help from another person toileting, which includes using toliet, bedpan, or urinal?: A Lot ?Help from another person bathing (including washing, rinsing, drying)?: A Lot ?Help from another person to put on and taking off regular upper body clothing?: A Little ?Help from another person to put on and taking off regular lower body clothing?: Total ?6 Click Score: 15 ? ?  ?End of Session Equipment Utilized During Treatment: Gait belt;Rolling walker (2 wheels);Back brace ? ?OT Visit Diagnosis: Other abnormalities of gait and mobility (R26.89);Muscle weakness (generalized) (M62.81);Pain ?Pain - Right/Left: Right ?Pain - part of body:  (low back) ?  ?Activity Tolerance Patient  tolerated treatment well ?  ?Patient Left in chair;with call bell/phone within reach;with chair alarm set ?  ?Nurse Communication Mobility status ?  ? ?   ? ?Time: 6759-1638 ?OT Time Calculation (min): 20 min ? ?Charges: OT General Charges ?$OT Visit: 1 Visit ?OT Treatments ?$Self Care/Home Management : 8-22 mins ? ?Barry Brunner, OT ?Acute Rehabilitation Services ?Pager [phone] ?Office [phone] ? ? ?Chancy Milroy ?03/17/2022, 1:57 PM ?

## 2022-03-17 NOTE — Progress Notes (Signed)
?   ? ? ? ? ?[facility] ? ?Date of Admission:  03/11/2022    ? ? ?Total days of antibiotics: 2.5  ?       Doxycycline (3/29-3/31) ?       Levofloxacin (3/30-3/31) ?       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 She was then transitioned to oral Doxycycline and Levofloxacin on 02/25/2022 after follow up visit with Dr. Orvan Falconer. Re-admitted after developing worsening back pain with MRI evidence of persistent osteomyelitis-discitis at the L1-2. Home oral Doxycycline and Levofloxacin initially started on admission however discontinued on 3/31. ? ?Yesterday afternoon, patient underwent L1-2 arthrodesis with segmental instrumentation of L1-4. Per neurosurgery's note, intra-operative evaluation of the L1-2 disc space did not demonstrate any infection. Due to this, culture data was not obtained.  ? ?Given patient has completed nearly 8 week course of antibiotics (6 weeks IV, 2 weeks PO) and no intra-operative evidence of infection, will discontinue antibiotics for now and monitor clinically.  ? ?PLAN: ?Discontinue home Doxycycline and Levofloxacin ?Outpatient follow up with ID will be arranged  ? ?Principal Problem: ?  Vertebral osteomyelitis (HCC) ?Active Problems: ?  Discitis of lumbar region ?  Essential hypertension ?  HLD (hyperlipidemia) ?  Edema of right lower extremity ?  Hypokalemia ? ? amLODipine  10 mg Oral Daily  ? bisacodyl  10 mg Rectal Once  ? cloNIDine  0.1 mg Oral BID  ? diclofenac Sodium  2 g Topical QID  ? docusate sodium  100 mg Oral BID  ? furosemide  40 mg Oral Daily  ? gabapentin  300 mg Oral TID  ? losartan  100 mg Oral Daily  ? polyethylene glycol  17 g Oral Daily  ? pravastatin  40 mg Oral q1800  ? sodium chloride flush  3 mL Intravenous Q12H   ? ?SUBJECTIVE: ?Hayley Padilla states she tolerated surgery yesterday well without any complications. She continues to endorse back pain but is looking forward to going to SNF. She denies any other complaints at this time.  ? ?No acute overnight events. This AM, elevated temperature of 100.0 noted but did not develop overt fever. Vital signs otherwise stable. No labs available.  ? ?Review of Systems: ?Negative except as noted above.  ? ?Allergies  ?Allergen Reactions  ? Penicillins Anaphylaxis  ?  Did it involve swelling of the face/tongue/throat, SOB, or low BP? Yes ?Did it involve sudden or severe rash/hives, skin peeling, or any reaction on the inside of your mouth or nose? No ?Did you need to seek medical attention at a hospital or doctor's office? Yes ?When did it last happen?      "Been a While"  ?If all above answers are ?NO?, may proceed with cephalosporin use. ?  ? Aspirin Other (See Comments)  ?  brusing _ patient requested to be listed  ? ?OBJECTIVE: ?Vitals:  ? 03/17/22 0004 03/17/22 0506 03/17/22 0516 03/17/22 0754  ?BP: 123/74 (!) (P) 164/83 (!) 164/83 (!) 145/78  ?Pulse: (!) 104 (!) (P) 110 (!) 105   ?Resp: 17 (P) 17 18 18   ?Temp: 98.6 ?F (37 ?C) (P) 100 ?F (37.8 ?C) 100 ?F (37.8 ?C) 100 ?F (37.8 ?C)  ?TempSrc: Oral (P) Oral Oral Oral  ?SpO2: 97% (P) 100% 97% 100%  ?  Weight:      ?Height:      ? ?Body mass index is 38.04 kg/m?. ? ?Physical Exam ?Vitals and nursing note reviewed.  ?Constitutional:   ?   General: She is not in acute distress. ?   Appearance: She is normal weight.  ?HENT:  ?   Head: Normocephalic and atraumatic.  ?Eyes:  ?   Conjunctiva/sclera: Conjunctivae normal.  ?   Pupils: Pupils are equal, round, and reactive to light.  ?Cardiovascular:  ?   Rate and Rhythm: Normal rate and regular rhythm.  ?Pulmonary:  ?   Effort: Pulmonary effort is normal. No respiratory distress.  ?Abdominal:  ?   General: Bowel sounds are normal.  ?   Palpations: Abdomen is soft.  ?Musculoskeletal:  ?   Comments:  Wound vac in place with serosanguinous drainage. Surgical dressing in place overlying incision.   ?Skin: ?   General: Skin is warm and dry.  ?Neurological:  ?   Mental Status: She is alert and oriented to person, place, and time.  ?Psychiatric:     ?   Mood and Affect: Mood normal.     ?   Behavior: Behavior normal.     ?   Thought Content: Thought content normal.     ?   Judgment: Judgment normal.  ? ?Lab Results ?Lab Results  ?Component Value Date  ? WBC 6.2 03/16/2022  ? HGB 10.4 (L) 03/16/2022  ? HCT 32.4 (L) 03/16/2022  ? MCV 72.8 (L) 03/16/2022  ? PLT 298 03/16/2022  ?  ?Lab Results  ?Component Value Date  ? CREATININE 0.62 03/16/2022  ? BUN 14 03/16/2022  ? NA 140 03/16/2022  ? K 3.6 03/16/2022  ? CL 105 03/16/2022  ? CO2 28 03/16/2022  ?  ?Lab Results  ?Component Value Date  ? ALT 14 03/12/2022  ? AST 16 03/12/2022  ? ALKPHOS 133 (H) 03/12/2022  ? BILITOT 0.6 03/12/2022  ?  ?Microbiology: ?Recent Results (from the past 240 hour(s))  ?Surgical pcr screen     Status: None  ? Collection Time: 03/16/22  5:04 AM  ? Specimen: Nasal Mucosa; Nasal Swab  ?Result Value Ref Range Status  ? MRSA, PCR NEGATIVE NEGATIVE Final  ? Staphylococcus aureus NEGATIVE NEGATIVE Final  ?  Comment: (NOTE) ?The Xpert SA Assay (FDA approved for NASAL specimens in patients 42 ?years of age and older), is one component of a comprehensive ?surveillance program. It is not intended to diagnose infection nor to ?guide or monitor treatment. ?Performed at [facility], [address] [place], [place] ?[address] ?  ? ?Dr. Verdene Lennert ?Internal Medicine PGY-3  ?03/17/2022, 11:20 AM ? ?

## 2022-03-17 NOTE — Anesthesia Postprocedure Evaluation (Signed)
Anesthesia Post Note ? ?Patient: Hayley Padilla ? ?Procedure(s) Performed: Lumbar One-Two Posterior Lateral Fusion with pedicle screws (Back) ? ?  ? ?Patient location during evaluation: PACU ?Anesthesia Type: General ?Level of consciousness: awake ?Pain management: pain level controlled ?Vital Signs Assessment: post-procedure vital signs reviewed and stable ?Respiratory status: spontaneous breathing, nonlabored ventilation, respiratory function stable and patient connected to nasal cannula oxygen ?Cardiovascular status: blood pressure returned to baseline and stable ?Postop Assessment: no apparent nausea or vomiting ?Anesthetic complications: no ? ? ?No notable events documented. ? ?Last Vitals:  ?Vitals:  ? 03/17/22 1555 03/17/22 2025  ?BP: 123/78 (!) 141/99  ?Pulse: (!) 102 100  ?Resp: 17 17  ?Temp: 36.7 ?C 37.4 ?C  ?SpO2: 100% 99%  ?  ?Last Pain:  ?Vitals:  ? 03/17/22 2025  ?TempSrc: Oral  ?PainSc:   ? ? ?  ?  ?  ?  ?  ?  ? ?Filbert Craze P Ayren Zumbro ? ? ? ? ?

## 2022-03-18 ENCOUNTER — Encounter (HOSPITAL_COMMUNITY): Payer: Self-pay | Admitting: Neurosurgery

## 2022-03-18 MED ORDER — OXYCODONE-ACETAMINOPHEN 7.5-325 MG PO TABS: 1.0000 | ORAL_TABLET | Freq: Four times a day (QID) | ORAL | 0 refills | Status: DC | PRN

## 2022-03-18 NOTE — Progress Notes (Signed)
Postop day 2.  Patient still with quite a bit of incisional pain.  No radiating pain numbness or weakness.  She is slowly mobilizing with therapy. ? ?She is afebrile.  Her vital signs are stable.  Her drain output is moderate.  Urine output is good.  She is awake and alert.  She is oriented and appropriate.  Her motor and sensory function of her lower extremities are intact.  Her wound is clean and dry. ? ?Overall progressing about as would be expected.  She certainly can have quite a bit of incisional pain initially after the surgery.  I am hopeful that over the next couple days as her incisional pain receives we will see how much benefit she gets from the fusion itself.  Continue efforts at rehabilitation.  Agree with likely return to skilled nursing for further convalescence. ?

## 2022-03-18 NOTE — Social Work (Signed)
Authorization approved for [facility] ?[id number] ?[id number] ?SNF ?03/17/2022-03/19/2022 ?Approved ?

## 2022-03-18 NOTE — Discharge Summary (Signed)
Physician Discharge Summary  ?Hayley Flockllen L Fujimoto ZOX:[id number] DOB: 08-25-1951 DOA: 03/11/2022 ? ?PCP: Fleet ContrasAvbuere, Edwin, MD ? ?Admit date: 03/11/2022 ?Discharge date: 03/18/2022 ? ?Admitted From: Home ?Disposition:  SNF ? ?Discharge Condition:Stable ?CODE STATUS:FULL ?Diet recommendation: Heart Healthy  ? ?Brief/Interim Summary: ?Hayley Padilla is a 71 y.o. female with medical history significant of hypertension, hyperlipidemia, obesity (BMI 38.04), history of degenerative disc disease, posterior spinal fusion at L4-5 in 2011 and history of revision of fusion at L2-4 who presented with back pain.  She was admitted on 2/1-2/7 for back pain and progressive numbness and weakness of her right leg.  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  Status post IR disc aspiration on 2/2, culture negative.  Infectious disease was consulted, PICC line placed, and discharged to SNF on IV daptomycin and ertapenem for total duration of 6 weeks of IV antibiotics.  Switched to oral doxycycline and Levaquin by infectious disease on 3/15 and PICC removed. Patient subsequently presented to the ED complaining of worsening back pain after a fall.  No fever or leukocytosis reported .  She was seen by neurosurgery here.  She was found to have significant degenerative changes at L1-L2 related to a septic osteomyelitis-discitis and underwent L1-2 PLIF with extension of her posterior lateral hardware from L1 to L4 by Dr. Jordan LikesPool on 03/16/2022.  ID recommended no need of antibiotics.  She was seen by PT/OT and recommended skilled nursing facility.  She is medically stable for discharge.  She will follow-up with neurosurgery and ID as an outpatient ? ?Following problems were addressed during her hospitalization: ? ?Back pain/history of discitis-osteomyelitis L1-L2: Plan/ management  as above.  Continue pain management, supportive care at a skilled nursing facility.  Also on gabapentin ? ?Hypertension: Currently blood pressure stable.  Continue current medications. ? ?Hyperlipidemia: Continue statin ? ?Obesity: BMI of 38 ? ? ?Discharge Diagnoses:  ?Principal Problem: ?  Vertebral osteomyelitis (HCC) ?Active Problems: ?  Edema of right lower extremity ?  Hypokalemia ?  Discitis of lumbar region ?  Essential hypertension ?  HLD (hyperlipidemia) ? ? ? ?Discharge Instructions ? ?Discharge Instructions   ? ? Diet - low sodium heart healthy   Complete by: As directed ?  ? Discharge instructions   Complete by: As directed ?  ? 1)Please take prescribed medications as instructed ?2)Please follow-up with neurosurgery as an outpatient in 1-2 weeks ?3)Follow up with ID as an outpatient.  You have  an appointment on 4/11  ? Increase activity slowly   Complete by: As directed ?  ? No wound care   Complete by: As directed ?  ? ?  ? ?Allergies as of 03/18/2022   ? ?   Reactions  ? Penicillins Anaphylaxis  ? Did it involve swelling of the face/tongue/throat, SOB, or low BP? Yes ?Did it involve sudden or severe rash/hives, skin peeling, or any reaction on the inside of your mouth or nose? No ?Did you need to seek medical attention at a hospital or doctor's office? Yes ?When did it last happen?      "Been a While"  ?If all above answers are ?NO?, may proceed with cephalosporin use.  ? Aspirin Other (See Comments)  ? brusing _ patient requested to be listed  ? ?  ? ?  ?Medication List  ?  ? ?STOP taking  these medications   ? ?doxycycline 100 MG tablet ?Commonly known as: VIBRA-TABS ?  ?levofloxacin 500 MG tablet ?Commonly known as: LEVAQUIN ?  ? ?  ? ?TAKE these medications   ? ?amLODipine 10 MG tablet ?Commonly known as: NORVASC ?Take 10 mg by mouth daily. ?  ?cloNIDine 0.1 MG tablet ?Commonly known as: CATAPRES ?Take 0.1 mg by mouth 2 (two) times daily. ?  ?cyclobenzaprine 10 MG tablet ?Commonly known as:  FLEXERIL ?Take 10 mg by mouth 3 (three) times daily as needed for muscle spasms. ?  ?furosemide 40 MG tablet ?Commonly known as: LASIX ?Take 40 mg by mouth daily. ?  ?gabapentin 300 MG capsule ?Commonly known as: NEURONTIN ?Take 300 mg by mouth 3 (three) times daily. ?  ?losartan 100 MG tablet ?Commonly known as: COZAAR ?Take 100 mg by mouth daily. ?  ?lovastatin 40 MG tablet ?Commonly known as: MEVACOR ?Take 1 tablet (40 mg total) by mouth every evening. Hold while on Daptomycin - can resume once course completed. ?What changed: additional instructions ?  ?oxyCODONE-acetaminophen 7.5-325 MG tablet ?Commonly known as: PERCOCET ?Take 1 tablet by mouth every 6 (six) hours as needed for severe pain. ?  ?polyethylene glycol 17 g packet ?Commonly known as: MIRALAX / GLYCOLAX ?Take 17 g by mouth daily. ?What changed:  ?when to take this ?reasons to take this ?  ?potassium chloride 10 MEQ tablet ?Commonly known as: KLOR-CON M ?Take 10 mEq by mouth daily. ?  ?senna-docusate 8.6-50 MG tablet ?Commonly known as: Senokot-S ?Take 1 tablet by mouth at bedtime. ?  ?Vitamin D (Ergocalciferol) 1.25 MG (50000 UNIT) Caps capsule ?Commonly known as: DRISDOL ?Take 50,000 Units by mouth every Monday. ?  ? ?  ? ?  ?  ? ? ?  ?Durable Medical Equipment  ?(From admission, onward)  ?  ? ? ?  ? ?  Start     Ordered  ? 03/16/22 1841  DME Walker rolling  Once       ?Question:  Patient needs a walker to treat with the following condition  Answer:  Spondylolisthesis  ? 03/16/22 1840  ? 03/16/22 1841  DME 3 n 1  Once       ? 03/16/22 1840  ? ?  ?  ? ?  ? ? Follow-up Information   ? ? Julio Sicks, MD. Schedule an appointment as soon as possible for a visit in 2 week(s).   ?Specialty: Neurosurgery ?Contact information: ?[address] ?[address] ?[place] [address] ?[phone] ? ? ?  ?  ? ?  ?  ? ?  ? ?Allergies  ?Allergen Reactions  ? Penicillins Anaphylaxis  ?  Did it involve swelling of the face/tongue/throat, SOB, or low BP? Yes ?Did  it involve sudden or severe rash/hives, skin peeling, or any reaction on the inside of your mouth or nose? No ?Did you need to seek medical attention at a hospital or doctor's office? Yes ?When did it last happen?      "Been a While"  ?If all above answers are ?NO?, may proceed with cephalosporin use. ?  ? Aspirin Other (See Comments)  ?  brusing _ patient requested to be listed  ? ? ?Consultations: ?Neurosurgery, ID ? ? ?Procedures/Studies: ?DG Lumbar Spine 2-3 Views ? ?Result Date: 03/16/2022 ?CLINICAL DATA:  Lumbar posterior fusion. EXAM: LUMBAR SPINE - 2-3 VIEW COMPARISON:  Lumbar spine x-ray 11/29/2019. MRI lumbar spine 03/11/2022. FINDINGS: Intraoperative lumbar spine. 2 low resolution intraoperative spot views of the lumbar  spine were obtained. L2-L4 posterior fusion hardware is partially visualized. Total fluoroscopy time: 10 seconds Total radiation dose: 5.1 cm micro Gy IMPRESSION: Intraoperative lumbar posterior fusion. Electronically Signed   By: Darliss Cheney M.D.   On: 03/16/2022 17:10  ? ?DG Pelvis 1-2 Views ? ?Result Date: 03/11/2022 ?CLINICAL DATA:  Fall EXAM: PELVIS - 1-2 VIEW COMPARISON:  None FINDINGS: Osseous demineralization. Narrowing of the hip joints bilaterally, mild in degree. SI joints preserved. No acute fracture, dislocation, or bone destruction. Prior laminectomy L5 with orthopedic hardware at L3-L4. 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 IMPRESSION: No acute osseous abnormalities. Mild degenerative changes of the hip joints with postsurgical changes lumbar spine. Electronically Signed   By: Ulyses Southward M.D.   On: 03/11/2022 17:22  ? ?CT Lumbar Spine Wo Contrast ? ?Result Date: 03/11/2022 ?CLINICAL DATA:  Larey Seat at physical therapy today. Back pain. History of prior lumbar fusion and history of discitis and osteomyelitis at L1-2. EXAM: CT LUMBAR SPINE WITHOUT CONTRAST TECHNIQUE: Multidetector CT imaging of the lumbar spine was performed without intravenous contrast  administration. Multiplanar CT image reconstructions were also generated. RADIATION DOSE REDUCTION: This exam was performed according to the departmental dose-optimization program which includes automated exposur

## 2022-03-18 NOTE — Progress Notes (Signed)
Patient discharged to SNF in stable condition.  

## 2022-03-18 NOTE — Progress Notes (Signed)
Physical Therapy Treatment ?Patient Details ?Name: Hayley Padilla ?MRN: [id number] ?DOB: Feb 27, 1951 ?Today's Date: 03/18/2022 ? ? ?History of Present Illness Pt is a 71 y.o. female admitted 03/11/22 after fall at home with back pain. 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 S/p L1-L2 posterior lateral arthrodesis 4/3. PMH includes HTN, HLD, DDD, L4-5 fusion (2011), recent admission 01/2022 with similar issue of L1-2 osteomyelitis. ?  ?PT Comments  ? ? Pt slowly progressing with mobility, requiring heavy modA for bed mobility and step pivot transfers with RW; pt attributes this to significant pain. Pt demonstrates poor awareness and decreased attention, requires frequent cues for back precautions. Pt remains limited by pain, generalized weakness, decreased activity tolerance, poor balance strategies/postural reactions and impaired cognition. Continue to recommend SNF-level therapies to maximize functional mobility and independence prior to return home. ?   ?Recommendations for follow up therapy are one component of a multi-disciplinary discharge planning process, led by the attending physician.  Recommendations may be updated based on patient status, additional functional criteria and insurance authorization. ? ?Follow Up Recommendations ? Skilled nursing-short term rehab (<3 hours/day) ?  ?  ?Assistance Recommended at Discharge Frequent or constant Supervision/Assistance  ?Patient can return home with the following Assistance with cooking/housework;Assist for transportation;Help with stairs or ramp for entrance;A lot of help with walking and/or transfers;A lot of help with bathing/dressing/bathroom ?  ?Equipment Recommendations ? Wheelchair (measurements PT);Wheelchair cushion (measurements PT)  ?  ?Recommendations for Other Services   ? ? ?  ?Precautions / Restrictions Precautions ?Precautions: Fall;Back;Other  (comment) ?Precaution Comments: hemovac lumbar incision ?Required Braces or Orthoses: Spinal Brace ?Spinal Brace: Lumbar corset;Applied in sitting position ?Restrictions ?Weight Bearing Restrictions: No  ?  ? ?Mobility ? Bed Mobility ?Overal bed mobility: Needs Assistance ?Bed Mobility: Supine to Sit ?  ?  ?Supine to sit: Mod assist, HOB elevated ?  ?  ?General bed mobility comments: ModA for trunk elevation and scooting L hip to EOB ?  ? ?Transfers ?Overall transfer level: Needs assistance ?Equipment used: Rolling walker (2 wheels) ?Transfers: Sit to/from Stand, Bed to chair/wheelchair/BSC ?Sit to Stand: Mod assist ?  ?Step pivot transfers: Mod assist ?  ?  ?  ?General transfer comment: Performed sit<>stand and step pivot from bed>BSC>recliner; heavy modA for trunk elevation and stability, cues for sequencing and assist for RW management; pt reports limited by pain, also with urine incontinence upon initial standing ?  ? ?Ambulation/Gait ?  ?  ?  ?  ?  ?  ?  ?General Gait Details: pt declines secondary to pain and fatigue after multiple transfers ? ? ?Stairs ?  ?  ?  ?  ?  ? ? ?Wheelchair Mobility ?  ? ?Modified Rankin (Stroke Patients Only) ?  ? ? ?  ?Balance Overall balance assessment: Needs assistance ?Sitting-balance support: No upper extremity supported, Feet supported ?Sitting balance-Leahy Scale: Poor ?Sitting balance - Comments: reliant on single UE support this session ?Postural control: Right lateral lean ?Standing balance support: Bilateral upper extremity supported, During functional activity, Reliant on assistive device for balance ?Standing balance-Leahy Scale: Poor ?Standing balance comment: relies on BUE and external support ?  ?  ?  ?  ?  ?  ?  ?  ?  ?  ?  ?  ? ?  ?Cognition Arousal/Alertness: Awake/alert ?Behavior During Therapy: [facility] for tasks assessed/performed ?Overall Cognitive Status: No family/caregiver present to determine baseline cognitive functioning ?Area  of Impairment: Attention,  Memory, Safety/judgement, Awareness, Problem solving ?  ?  ?  ?  ?  ?  ?  ?  ?  ?Current Attention Level: Selective ?Memory: Decreased recall of precautions, Decreased short-term memory ?  ?Safety/Judgement: Decreased awareness of safety, Decreased awareness of deficits ?Awareness: Emergent ?Problem Solving: Requires verbal cues ?  ?  ?  ? ?  ?Exercises   ? ?  ?General Comments   ?  ?  ? ?Pertinent Vitals/Pain Pain Assessment ?Pain Assessment: Faces ?Faces Pain Scale: Hurts even more ?Pain Location: back, RLE ?Pain Descriptors / Indicators: Discomfort, Grimacing, Guarding ?Pain Intervention(s): Monitored during session, Premedicated before session, Repositioned  ? ? ?Home Living   ?  ?  ?  ?  ?  ?  ?  ?  ?  ?   ?  ?Prior Function    ?  ?  ?   ? ?PT Goals (current goals can now be found in the care plan section) Progress towards PT goals: Progressing toward goals (slowly, limited by pain, self-limiting(?)) ? ?  ?Frequency ? ? ? Min 3X/week ? ? ? ?  ?PT Plan Current plan remains appropriate  ? ? ?Co-evaluation   ?  ?  ?  ?  ? ?  ?AM-PAC PT "6 Clicks" Mobility   ?Outcome Measure ? Help needed turning from your back to your side while in a flat bed without using bedrails?: A Lot ?Help needed moving from lying on your back to sitting on the side of a flat bed without using bedrails?: A Lot ?Help needed moving to and from a bed to a chair (including a wheelchair)?: A Lot ?Help needed standing up from a chair using your arms (e.g., wheelchair or bedside chair)?: A Lot ?Help needed to walk in hospital room?: Total ?Help needed climbing 3-5 steps with a railing? : Total ?6 Click Score: 10 ? ?  ?End of Session Equipment Utilized During Treatment: Gait belt ?Activity Tolerance: Patient limited by pain ?Patient left: in chair;with call bell/phone within reach;with chair alarm set;with nursing/sitter in room ?Nurse Communication: Mobility status ?PT Visit Diagnosis: Unsteadiness on feet (R26.81);Other abnormalities of gait and  mobility (R26.89);Muscle weakness (generalized) (M62.81);History of falling (Z91.81);Difficulty in walking, not elsewhere classified (R26.2);Pain ?  ? ? ?Time: 1050-1108 ?PT Time Calculation (min) (ACUTE ONLY): 18 min ? ?Charges:  $Therapeutic Activity: 8-22 mins          ?          ? ?Mabeline Caras, PT, DPT ?Acute Rehabilitation Services  ?Pager [phone] ?Office [phone] ? ?Derry Lory ?03/18/2022, 12:34 PM ? ?

## 2022-03-18 NOTE — TOC Transition Note (Signed)
Transition of Care (TOC) - CM/SW Discharge Note ? ? ?Patient Details  ?Name: Hayley Padilla ?MRN: [id number] ?Date of Birth: 1951-09-09 ? ?Transition of Care (TOC) CM/SW Contact:  ?Emeterio Reeve, LCSW ?Phone Number: ?03/18/2022, 10:35 AM ? ? ?Clinical Narrative:    ?  ?Per MD patient ready for DC to [facility]. RN, patient, patient's family, and facility notified of DC. Discharge Summary and FL2 sent to facility. DC packet on chart. Insurance Josem Kaufmann has been received. Ambulance transport requested for patient.  ?  ?RN to call report to [phone].  ? ?CSW will sign off for now as social work intervention is no longer needed. Please consult [place] again if new needs arise. ? ? ? ?Final next level of care: [place] ?Barriers to Discharge: Barriers Resolved ? ? ?Patient Goals and CMS Choice ?Patient states their goals for this hospitalization and ongoing recovery are:: to walk upsatirs to bedroom ?CMS Medicare.gov Compare Post Acute Care list provided to:: Patient ?Choice offered to / list presented to : Patient ? ?Discharge Placement ?  ?           ?Patient chooses bed at: [facility] ?Patient to be transferred to facility by: [facility] ?Name of family member notified: Pt is alert and oriented x4 ?Patient and family notified of of transfer: 03/18/22 ? ?Discharge Plan and Services ?  ?  ?           ?  ?  ?  ?  ?  ?  ?  ?  ?  ?  ? ?Social Determinants of Health (SDOH) Interventions ?  ? ? ?Readmission Risk Interventions ?   ? View : No data to display.  ?  ?  ?  ? ? ?Emeterio Reeve, LCSW ?Clinical Social Worker ? ? ? ?

## 2022-03-24 ENCOUNTER — Ambulatory Visit: Payer: 59 | Admitting: Internal Medicine

## 2022-08-04 IMAGING — RF DG LUMBAR SPINE 2-3V
1 series · 2 of 2 positions shown · non-contrast
Comparison: Lumbar spine x-ray 11/29/2019. MRI lumbar spine
03/11/2022.

CLINICAL DATA: Lumbar posterior fusion.

EXAM:
LUMBAR SPINE - 2-3 VIEW

[Series 1: run · 2 of 2 slices shown]
[im 1/2]
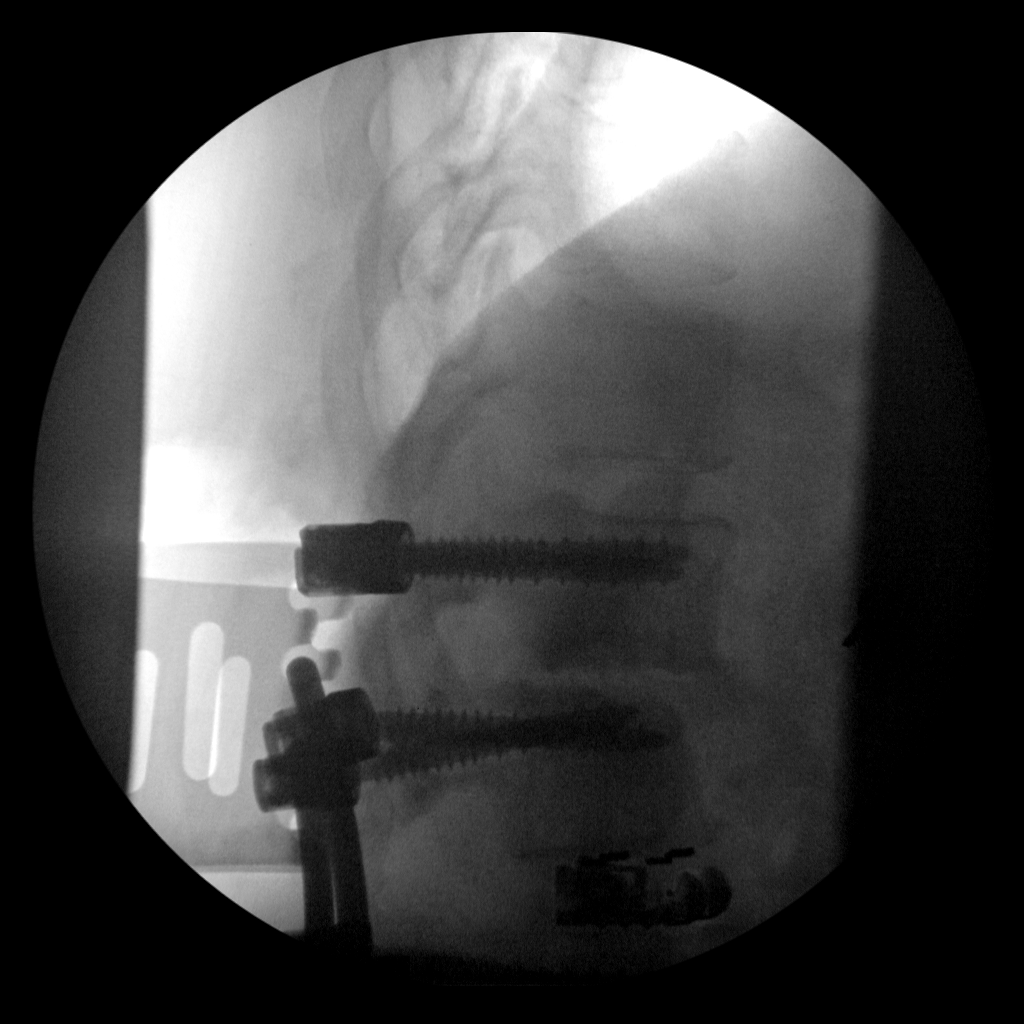
[im 2/2]
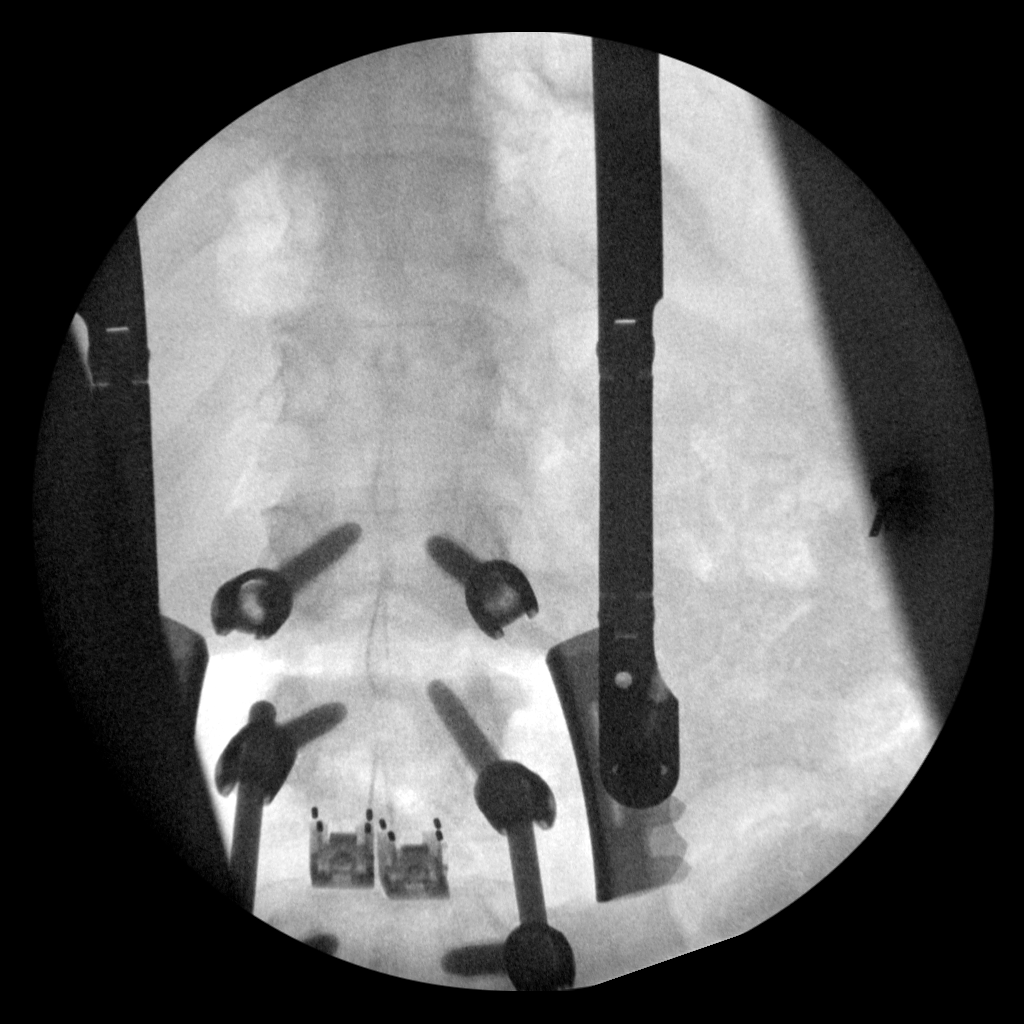

[2 of 2 positions shown; findings below may reference images not displayed]

FINDINGS: Intraoperative lumbar spine.

2 low resolution intraoperative spot views of the lumbar spine were
obtained. L2-L4 posterior fusion hardware is partially visualized.

Total fluoroscopy time: 10 seconds

Total radiation dose: 5.1 cm micro Gy
IMPRESSION: Intraoperative lumbar posterior fusion.

## 2022-09-07 ENCOUNTER — Other Ambulatory Visit: Payer: Self-pay | Admitting: Internal Medicine

## 2022-09-07 DIAGNOSIS — Z1231 Encounter for screening mammogram for malignant neoplasm of breast: Secondary | ICD-10-CM

## 2022-09-10 NOTE — Telephone Encounter (Signed)
Opened in error

## 2022-10-07 ENCOUNTER — Ambulatory Visit
Admission: RE | Admit: 2022-10-07 | Discharge: 2022-10-07 | Disposition: A | Discharge status: Home / Self Care | Payer: 59 | Source: Ambulatory Visit | Attending: Internal Medicine | Admitting: Internal Medicine

## 2022-10-07 DIAGNOSIS — Z1231 Encounter for screening mammogram for malignant neoplasm of breast: Secondary | ICD-10-CM

## 2023-05-11 ENCOUNTER — Encounter: Payer: Self-pay | Admitting: Physical Medicine & Rehabilitation

## 2023-06-22 ENCOUNTER — Encounter: Payer: Self-pay | Admitting: Physical Medicine & Rehabilitation

## 2023-06-22 ENCOUNTER — Encounter
Payer: No Typology Code available for payment source | Attending: Physical Medicine & Rehabilitation | Admitting: Physical Medicine & Rehabilitation

## 2023-06-22 VITALS — BP 120/75 | HR 85 | Ht 62.0 in | Wt 219.0 lb

## 2023-06-22 DIAGNOSIS — M25551 Pain in right hip: Secondary | ICD-10-CM | POA: Insufficient documentation

## 2023-06-22 DIAGNOSIS — M25562 Pain in left knee: Secondary | ICD-10-CM | POA: Insufficient documentation

## 2023-06-22 DIAGNOSIS — G8929 Other chronic pain: Secondary | ICD-10-CM | POA: Insufficient documentation

## 2023-06-22 DIAGNOSIS — M25561 Pain in right knee: Secondary | ICD-10-CM | POA: Insufficient documentation

## 2023-06-22 DIAGNOSIS — M961 Postlaminectomy syndrome, not elsewhere classified: Secondary | ICD-10-CM | POA: Diagnosis not present

## 2023-06-22 DIAGNOSIS — M25552 Pain in left hip: Secondary | ICD-10-CM | POA: Insufficient documentation

## 2023-06-22 NOTE — Patient Instructions (Signed)
If urine screen Ok would start Butrans patch

## 2023-06-22 NOTE — Progress Notes (Signed)
Subjective:    Patient ID: Hayley Padilla, female    DOB: 1951-08-02, 72 y.o.   MRN: [id number] 72 y.o. female with medical history significant of hypertension, hyperlipidemia, obesity (BMI 38.04), history of degenerative disc disease, posterior spinal fusion at L4-5 in 2011 and history of revision of fusion at L2-4 who presented with back pain.  She was admitted on 2/1-2/7 for back pain and progressive numbness and weakness of her right leg.  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  Status post IR disc aspiration on 2/2, culture negative.  Infectious disease was consulted, PICC line placed, and discharged to SNF on IV daptomycin and ertapenem for total duration of 6 weeks of IV antibiotics.  Switched to oral doxycycline and Levaquin by infectious disease on 3/15 and PICC removed. Patient subsequently presented to the ED complaining of worsening back pain after a fall.  No fever or leukocytosis reported .  She was seen by neurosurgery here.  She was found to have significant degenerative changes at L1-L2 related to a septic osteomyelitis-discitis and underwent L1-2 PLIF with extension of her posterior lateral hardware from L1 to L4 by Dr. Jordan Likes on 03/16/2022.  ID recommended no need of antibiotics.  She was seen by PT/OT and recommended skilled nursing facility.  She is medically stable for discharge.  She will follow-up with neurosurgery and ID as an outpatient   HPI CC:  Low back > knee pain   Hx multiple lumbar fusion surgeries, most recently in 2023.  Also had osteomyelitis L1-2 requiring IV abx ~1 yr ago Knee pain R>L , is s/p Left knee replacement  Pt used to take oxycodone 7.5 mg ~2 tabs per day Pt reports she last took this ~104mo ago (used to go to [employer])  She changed [employer] with a  walker , last seen by Dr Jordan Likes 3-4 months ago   Pre op pt had spine injections which the patient states were not very effective. Patient cannot climb steps she drives.  She ambulates with a rollator walker.  Her pain is sharp stabbing dull worsened with walking and standing.  She needs some assistance with household duties and shopping.  She has problem with constipation.  She does not exercise.  She uses heat for her pain Pain Inventory Average Pain 10 Pain Right Now 10 My pain is sharp, dull, and stabbing  In the last 24 hours, has pain interfered with the following? General activity 0 Relation with others 0 Enjoyment of life 9 What TIME of day is your pain at its worst? varies Sleep (in general) Fair  Pain is worse with: walking and standing Pain improves with: medication Relief from Meds: 7  walk with assistance use a cane use a walker how many minutes can you walk? 6 ability to climb steps?  yes do you drive?  yes use a wheelchair transfers alone  disabled: date disabled 2011 I need assistance with the following:  household duties and shopping  bladder control problems numbness tingling  New pt  New pt    Family History  Problem Relation Age of Onset   Colon cancer Neg Hx    Colon polyps Neg Hx    Esophageal cancer Neg Hx    Rectal cancer Neg Hx    Stomach cancer Neg Hx  Social History   Socioeconomic History   Marital status: Widowed    Spouse name: Not on file   Number of children: Not on file   Years of education: Not on file   Highest education level: Not on file  Occupational History   Not on file  Tobacco Use   Smoking status: Never   Smokeless tobacco: Current    Types: Chew   Tobacco comments:    last used 03/17/2020 @ 2200  Vaping Use   Vaping Use: Never used  Substance and Sexual Activity   Alcohol use: Not Currently   Drug use: Never   Sexual activity: Not on file  Other Topics Concern   Not on file  Social History Narrative    Not on file   Social Determinants of Health   Financial Resource Strain: Not on file  Food Insecurity: Not on file  Transportation Needs: Not on file  Physical Activity: Not on file  Stress: Not on file  Social Connections: Not on file   Past Surgical History:  Procedure Laterality Date   BACK SURGERY     lower back   IR LUMBAR DISC ASPIRATION W/IMG GUIDE  01/15/2022   LAMINECTOMY WITH POSTERIOR LATERAL ARTHRODESIS LEVEL 1 N/A 03/16/2022   Procedure: Lumbar One-Two Posterior Lateral Fusion with pedicle screws;  Surgeon: Julio Sicks, MD;  Location: [facility] OR;  Service: Neurosurgery;  Laterality: N/A;   MULTIPLE TOOTH EXTRACTIONS     Past Medical History:  Diagnosis Date   Allergy    Arthritis    Hyperlipidemia    Hypertension    PONV (postoperative nausea and vomiting)    BP 120/75   Pulse 85   Ht 5\' 2"  (1.575 m)   Wt 219 lb (99.3 kg)   SpO2 98%   BMI 40.06 kg/m   Opioid Risk Score:   Fall Risk Score:  `1  Depression screen [facility] 2/9     02/25/2022    9:04 AM 02/17/2022    2:15 PM 01/13/2022    2:35 PM  Depression screen PHQ 2/9  Decreased Interest 0 0 0  Down, Depressed, Hopeless 0 0 0  PHQ - 2 Score 0 0 0     Review of Systems  Constitutional:  Positive for unexpected weight change.  Gastrointestinal:  Positive for constipation.  Musculoskeletal:  Positive for gait problem.       Bilateral knee pain Right foot pain Left ankle pain  Neurological:  Positive for numbness.  All other systems reviewed and are negative.      Objective:   Physical Exam Vitals and nursing note reviewed.  Constitutional:      Appearance: She is obese.  HENT:     Head: Normocephalic and atraumatic.  Eyes:     Extraocular Movements: Extraocular movements intact.     Conjunctiva/sclera: Conjunctivae normal.     Pupils: Pupils are equal, round, and reactive to light.  Musculoskeletal:     Right lower leg: No edema.     Left lower leg: No edema.     Comments: Patient with mild  tenderness along the lumbar paraspinals. The patient has reduced internal/external rotation of bilateral hips accompanied by pain.  Patient has varus deformity at the right knee. She is status post left total knee replacement with some mild residual varus deformity.   Skin:    General: Skin is warm and dry.  Neurological:     Mental Status: She is alert and oriented to person, place, and time.  Comments: Negative straight leg raising bilateral 4/5 strength bilateral hip flexor knee extensor ankle dorsiflexor and plantar flexor Sensation reported is equal bilateral lower extremities Tone is normal bilateral upper and lower limbs Speech without dysarthria or aphasia Oriented x 3  Psychiatric:        Mood and Affect: Mood normal.        Behavior: Behavior normal.           Assessment & Plan:   1.  Lumbar postlaminectomy syndrome status post L2-L4 fusion.  She has decreased range of motion bilateral hips as well she does have mild degenerative changes seen on x-rays from approximately 1 year ago.  Will repeat. In addition she has bilateral knee pain and has had left knee replacement in the past she has a severe deformity right knee.  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 We discussed that repeat x-rays of the knees and hips will be performed. I would like to see her back in 1 month to follow-up on this. We will do urine drug screen she does have a history of responding favorably to low-dose narcotic analgesics.  Given her history of constipation would favor Butrans patch which started at 7.5 or 5 mcg dose and titrate upward as needed.  She is in favor of this.  She may require orthopedic referral for right knee OA although she may not be comfortable in undergoing surgery at this time.

## 2023-06-23 ENCOUNTER — Encounter: Payer: Self-pay | Admitting: Family

## 2023-06-23 ENCOUNTER — Ambulatory Visit (INDEPENDENT_AMBULATORY_CARE_PROVIDER_SITE_OTHER): Payer: 59 | Admitting: Family

## 2023-06-23 VITALS — BP 114/71 | HR 76 | Temp 98.5°F | Ht 62.5 in | Wt 220.4 lb

## 2023-06-23 DIAGNOSIS — Z0000 Encounter for general adult medical examination without abnormal findings: Secondary | ICD-10-CM

## 2023-06-23 DIAGNOSIS — Z7689 Persons encountering health services in other specified circumstances: Secondary | ICD-10-CM | POA: Diagnosis not present

## 2023-06-23 DIAGNOSIS — Z13228 Encounter for screening for other metabolic disorders: Secondary | ICD-10-CM

## 2023-06-23 DIAGNOSIS — Z131 Encounter for screening for diabetes mellitus: Secondary | ICD-10-CM

## 2023-06-23 DIAGNOSIS — Z13220 Encounter for screening for lipoid disorders: Secondary | ICD-10-CM

## 2023-06-23 DIAGNOSIS — Z130 Encounter for screening for diseases of the blood and blood-forming organs and certain disorders involving the immune mechanism: Secondary | ICD-10-CM

## 2023-06-23 DIAGNOSIS — Z1329 Encounter for screening for other suspected endocrine disorder: Secondary | ICD-10-CM

## 2023-06-23 DIAGNOSIS — Z0100 Encounter for examination of eyes and vision without abnormal findings: Secondary | ICD-10-CM

## 2023-06-23 DIAGNOSIS — Z1159 Encounter for screening for other viral diseases: Secondary | ICD-10-CM

## 2023-06-23 NOTE — Patient Instructions (Signed)
Thank you for choosing Primary Care at [facility] for your medical home!    Hayley Padilla was seen by Rema Fendt, NP today.   Trellis Moment Formisano's primary care provider is Ricky Stabs, NP.   For the best care possible,  you should try to see Ricky Stabs, NP whenever you come to office.   We look forward to seeing you again soon!  If you have any questions about your visit today,  please call us at [phone]  Or feel free to reach your provider via MyChart.   Keeping you healthy   Get these tests Blood pressure- Have your blood pressure checked once a year by your healthcare provider.  Normal blood pressure is 120/80. Weight- Have your body mass index (BMI) calculated to screen for obesity.  BMI is a measure of body fat based on height and weight. You can also calculate your own BMI at https://www.west-esparza.com/. Cholesterol- Have your cholesterol checked regularly starting at age 45, sooner may be necessary if you have diabetes, high blood pressure, if a family member developed heart diseases at an early age or if you smoke.  Chlamydia, HIV, and other sexual transmitted disease- Get screened each year until the age of 17 then within three months of each new sexual partner. Diabetes- Have your blood sugar checked regularly if you have high blood pressure, high cholesterol, a family history of diabetes or if you are overweight.   Get these vaccines Flu shot- Every fall. Tetanus shot- Every 10 years. Menactra- Single dose; prevents meningitis.   Take these steps Don't smoke- If you do smoke, ask your healthcare provider about quitting. For tips on how to quit, go to www.smokefree.gov or call 1-800-QUIT-NOW. Be physically active- Exercise 5 days a week for at least 30 minutes.  If you are not already physically active start slow and gradually work up to 30 minutes of moderate physical activity.  Examples of moderate activity include walking briskly, mowing the yard, dancing, swimming  bicycling, etc. Eat a healthy diet- Eat a variety of healthy foods such as fruits, vegetables, low fat milk, low fat cheese, yogurt, lean meats, poultry, fish, beans, tofu, etc.  For more information on healthy eating, go to www.thenutritionsource.org Drink alcohol in moderation- Limit alcohol intake two drinks or less a day.  Never drink and drive. Dentist- Brush and floss teeth twice daily; visit your dentis twice a year. Depression-Your emotional health is as important as your physical health.  If you're feeling down, losing interest in things you normally enjoy please talk with your healthcare provider. Gun Safety- If you keep a gun in your home, keep it unloaded and with the safety lock on.  Bullets should be stored separately. Helmet use- Always wear a helmet when riding a motorcycle, bicycle, rollerblading or skateboarding. Safe sex- If you may be exposed to a sexually transmitted infection, use a condom Seat belts- Seat bels can save your life; always wear one. Smoke/Carbon Monoxide detectors- These detectors need to be installed on the appropriate level of your home.  Replace batteries at least once a year. Skin Cancer- When out in the sun, cover up and use sunscreen SPF 15 or higher. Violence- If anyone is threatening or hurting you, please tell your healthcare provider.

## 2023-06-23 NOTE — Progress Notes (Signed)
Subjective:    Hayley Padilla - 72 y.o. female MRN [id number]  Date of birth: 1951/07/10  HPI  Hayley Padilla is to establish care and annual physical exam.  Current issues and/or concerns: - Needs eye doctor referral for routine exam.  - Established with Orthopedics.  - Established with Physical Therapy.  - No further issues/concerns for discussion today.   ROS per HPI    Health Maintenance:  Health Maintenance Due  Topic Date Due   Medicare Annual Wellness (AWV)  Never done   COVID-19 Vaccine (1) Never done   Hepatitis C Screening  Never done   DTaP/Tdap/Td (1 - Tdap) Never done   Zoster Vaccines- Shingrix (1 of 2) Never done   Pneumonia Vaccine 12+ Years old (2 of 2 - PCV) 01/14/2022     Past Medical History: Patient Active Problem List   Diagnosis Date Noted   Vertebral osteomyelitis (HCC) 03/12/2022   Hypokalemia 03/12/2022   Edema of right lower extremity 01/15/2022   Essential hypertension 01/14/2022   Chronic pain 01/14/2022   HLD (hyperlipidemia) 01/14/2022   Discitis 01/14/2022   Obesity (BMI 30-39.9) 01/13/2022   S/P lumbar spinal fusion 01/13/2022   Discitis of lumbar region 01/13/2022   Kyphosis 01/13/2022   Degenerative spondylolisthesis 06/12/2019    Social History   reports that she has never smoked. Her smokeless tobacco use includes chew. She reports that she does not currently use alcohol. She reports that she does not use drugs.   Family History  family history is not on file.   Medications: reviewed and updated   Objective:   Physical Exam BP 114/71   Pulse 76   Temp 98.5 F (36.9 C) (Oral)   Ht 5' 2.5" (1.588 m)   Wt 220 lb 6.4 oz (100 kg)   SpO2 93%   BMI 39.67 kg/m   Physical Exam HENT:     Head: Normocephalic and atraumatic.     Right Ear: Tympanic membrane, ear canal and external ear normal.     Left Ear: Tympanic membrane, ear canal and external ear normal.     Nose: Nose normal.     Mouth/Throat:     Mouth: Mucous  membranes are moist.     Pharynx: Oropharynx is clear.  Eyes:     Extraocular Movements: Extraocular movements intact.     Conjunctiva/sclera: Conjunctivae normal.     Pupils: Pupils are equal, round, and reactive to light.  Cardiovascular:     Rate and Rhythm: Normal rate and regular rhythm.     Pulses: Normal pulses.     Heart sounds: Normal heart sounds.  Pulmonary:     Effort: Pulmonary effort is normal.     Breath sounds: Normal breath sounds.  Chest:     Comments: Patient declined. Abdominal:     General: Bowel sounds are normal.     Palpations: Abdomen is soft.  Genitourinary:    Comments: Patient declined.  Musculoskeletal:        General: Normal range of motion.     Right shoulder: Normal.     Left shoulder: Normal.     Right upper arm: Normal.     Left upper arm: Normal.     Right elbow: Normal.     Left elbow: Normal.     Right forearm: Normal.     Left forearm: Normal.     Right wrist: Normal.     Left wrist: Normal.     Right hand: Normal.  Left hand: Normal.     Cervical back: Normal, normal range of motion and neck supple.     Thoracic back: Normal.     Lumbar back: Normal.     Right hip: Normal.     Left hip: Normal.     Right upper leg: Normal.     Left upper leg: Normal.     Right knee: Normal.     Left knee: Normal.     Right lower leg: Normal.     Left lower leg: Normal.     Right ankle: Normal.     Left ankle: Normal.     Right foot: Normal.     Left foot: Normal.  Skin:    General: Skin is warm and dry.     Capillary Refill: Capillary refill takes less than 2 seconds.  Neurological:     General: No focal deficit present.     Mental Status: She is alert and oriented to person, place, and time.  Psychiatric:        Mood and Affect: Mood normal.        Behavior: Behavior normal.       Assessment & Plan:  1. Encounter to establish care 2. Annual physical exam - Counseled on 150 minutes of exercise per week as tolerated, healthy  eating (including decreased daily intake of saturated fats, cholesterol, added sugars, sodium), STI prevention, and routine healthcare maintenance.  3. Screening for metabolic disorder - Routine screening.  - CMP14+EGFR  4. Screening for deficiency anemia - Routine screening.  - CBC  5. Diabetes mellitus screening - Routine screening.  - Hemoglobin A1c  6. Screening for cholesterol level - Routine screening.  - Lipid panel  7. Thyroid disorder screen - Routine screening.  - TSH  8. Need for hepatitis C screening test - Routine screening.  - Hepatitis C Antibody  9. Routine eye exam - Referral to Ophthalmology for further evaluation/management.  - Ambulatory referral to Ophthalmology    Patient was given clear instructions to go to Emergency Department or return to medical center if symptoms don't improve, worsen, or new problems develop.The patient verbalized understanding.  I discussed the assessment and treatment plan with the patient. The patient was provided an opportunity to ask questions and all were answered. The patient agreed with the plan and demonstrated an understanding of the instructions.   The patient was advised to call back or seek an in-person evaluation if the symptoms worsen or if the condition fails to improve as anticipated.    Ricky Stabs, NP 06/23/2023, 1:37 PM Primary Care at [facility]

## 2023-06-23 NOTE — Progress Notes (Signed)
Pt is here for annual check up.

## 2023-06-24 ENCOUNTER — Other Ambulatory Visit: Payer: Self-pay | Admitting: Family

## 2023-06-24 DIAGNOSIS — Z130 Encounter for screening for diseases of the blood and blood-forming organs and certain disorders involving the immune mechanism: Secondary | ICD-10-CM

## 2023-06-24 DIAGNOSIS — E119 Type 2 diabetes mellitus without complications: Secondary | ICD-10-CM | POA: Insufficient documentation

## 2023-06-24 DIAGNOSIS — E1165 Type 2 diabetes mellitus with hyperglycemia: Secondary | ICD-10-CM

## 2023-06-24 LAB — CBC
Hematocrit: 28.8 % — ABNORMAL LOW (ref 34.0–46.6)
Hemoglobin: 8.5 g/dL — ABNORMAL LOW (ref 11.1–15.9)
MCH: 22.5 pg — ABNORMAL LOW (ref 26.6–33.0)
MCHC: 29.5 g/dL — ABNORMAL LOW (ref 31.5–35.7)
MCV: 76 fL — ABNORMAL LOW (ref 79–97)
Platelets: 355 10*3/uL (ref 150–450)
RBC: 3.78 x10E6/uL (ref 3.77–5.28)
RDW: 15.7 % — ABNORMAL HIGH (ref 11.7–15.4)
WBC: 5 10*3/uL (ref 3.4–10.8)

## 2023-06-24 LAB — CMP14+EGFR
ALT: 10 IU/L (ref 0–32)
AST: 15 IU/L (ref 0–40)
Albumin: 4.1 g/dL (ref 3.8–4.8)
Alkaline Phosphatase: 134 IU/L — ABNORMAL HIGH (ref 44–121)
BUN/Creatinine Ratio: 17 (ref 12–28)
BUN: 12 mg/dL (ref 8–27)
Bilirubin Total: 0.3 mg/dL (ref 0.0–1.2)
CO2: 24 mmol/L (ref 20–29)
Calcium: 9.1 mg/dL (ref 8.7–10.3)
Chloride: 104 mmol/L (ref 96–106)
Creatinine, Ser: 0.71 mg/dL (ref 0.57–1.00)
Globulin, Total: 2.3 g/dL (ref 1.5–4.5)
Glucose: 127 mg/dL — ABNORMAL HIGH (ref 70–99)
Potassium: 4.2 mmol/L (ref 3.5–5.2)
Sodium: 141 mmol/L (ref 134–144)
Total Protein: 6.4 g/dL (ref 6.0–8.5)
eGFR: 90 mL/min/{1.73_m2} (ref >59)

## 2023-06-24 LAB — LIPID PANEL
Chol/HDL Ratio: 3.6 ratio (ref 0.0–4.4)
Cholesterol, Total: 158 mg/dL (ref 100–199)
HDL: 44 mg/dL (ref >39)
LDL Chol Calc (NIH): 91 mg/dL (ref 0–99)
Triglycerides: 129 mg/dL (ref 0–149)
VLDL Cholesterol Cal: 23 mg/dL (ref 5–40)

## 2023-06-24 LAB — HEMOGLOBIN A1C
Est. average glucose Bld gHb Est-mCnc: 140 mg/dL
Hgb A1c MFr Bld: 6.5 % — ABNORMAL HIGH (ref 4.8–5.6)

## 2023-06-24 LAB — HEPATITIS C ANTIBODY: Hep C Virus Ab: NONREACTIVE

## 2023-06-24 LAB — TOXASSURE SELECT,+ANTIDEPR,UR

## 2023-06-24 LAB — TSH: TSH: 2.08 u[IU]/mL (ref 0.450–4.500)

## 2023-06-24 MED ORDER — METFORMIN HCL ER 500 MG PO TB24: 500.0000 mg | ORAL_TABLET | Freq: Every day | ORAL | 1 refills | Status: DC

## 2023-06-24 MED ORDER — IRON (FERROUS SULFATE) 325 (65 FE) MG PO TABS: 325.0000 mg | ORAL_TABLET | Freq: Every day | ORAL | 0 refills | Status: DC

## 2023-06-25 ENCOUNTER — Other Ambulatory Visit: Payer: Self-pay

## 2023-06-25 ENCOUNTER — Ambulatory Visit: Payer: Self-pay | Admitting: *Deleted

## 2023-06-25 DIAGNOSIS — E1165 Type 2 diabetes mellitus with hyperglycemia: Secondary | ICD-10-CM

## 2023-06-25 MED ORDER — METFORMIN HCL ER 500 MG PO TB24: 500.0000 mg | ORAL_TABLET | Freq: Every day | ORAL | 1 refills | Status: DC

## 2023-06-25 NOTE — Telephone Encounter (Signed)
Reason for Disposition  [1] Follow-up call to recent contact AND [2] information only call, no triage required  Answer Assessment - Initial Assessment Questions 1. REASON FOR CALL or QUESTION: "What is your reason for calling today?" or "How can I best help you?" or "What question do you have that I can help answer?"     Pt given lab results per notes of Ricky Stabs, NP. Pt verbalized understanding.   Follow up appt scheduled for 07/27/2023 at 1:40 with Ricky Stabs, NP per request in results note.  Protocols used: Information Only Call - No Triage-A-AH

## 2023-06-25 NOTE — Telephone Encounter (Signed)
  Chief Complaint: Pt called in and was given the lab result message from Ricky Stabs, NP dated 06/24/2023 at 2:23 PM. Symptoms: N/A Frequency: N/A Pertinent Negatives: Patient denies N/A Disposition: [] ED /[] Urgent Care (no appt availability in office) / [] Appointment(In office/virtual)/ []  [place] [facility]/ [x] Home Care/ [] Refused Recommended Disposition /[] [place] [facility]/ []  Follow-up with PCP Additional Notes: 4 week follow up appt scheduled.

## 2023-07-01 ENCOUNTER — Ambulatory Visit: Payer: Self-pay

## 2023-07-01 ENCOUNTER — Telehealth: Payer: Self-pay | Admitting: *Deleted

## 2023-07-01 NOTE — Telephone Encounter (Signed)
Chief Complaint: ankle swelling Symptoms: pain at night right leg above ankle 10/10, no pain at present time during the day Frequency: onset after visit on 06/23/23 Pertinent Negatives: Patient denies other symptoms Disposition: [] ED /[] Urgent Care (no appt availability in office) / [x] Appointment(In office/virtual)/ []  [facility]/ [] Home Care/ [] Refused Recommended Disposition /[] [place] [facility]/ []  Follow-up with PCP Additional Notes: Advised first available 07/13/22, advised to go to the UC. She says she will take the first available on 07/14/23 and if the swelling gets worse, she will go to the ED. Advised will place on the waiting list with both providers.    Summary: Swelling & Pain in Ankles & Legs   Pt is calling to report swelling in both ankles and pain in right leg 10/10.     Reason for Disposition  MILD or MODERATE ankle swelling (e.g., can't move joint normally, can't do usual activities) (Exceptions: Itchy, localized swelling; swelling is chronic.)  Answer Assessment - Initial Assessment Questions 1. LOCATION: "Which ankle is swollen?" "Where is the swelling?"     Both ankles and right foot 2. ONSET: "When did the swelling start?"     2 days after having physical 3. SWELLING: "How bad is the swelling?" Or, "How large is it?" (e.g., mild, moderate, severe; size of localized swelling)    - NONE: No joint swelling.   - LOCALIZED: Localized; small area of puffy or swollen skin (e.g., insect bite, skin irritation).   - MILD: Joint looks or feels mildly swollen or puffy.   - MODERATE: Swollen; interferes with normal activities (e.g., work or school); decreased range of movement; may be limping.   - SEVERE: Very swollen; can't move swollen joint at all; limping a lot or unable to walk.     Mild-moderate 4. PAIN: "Is there any pain?" If Yes, ask: "How bad is it?" (Scale 1-10; or mild, moderate, severe)   - NONE (0): no pain.   - MILD (1-3): doesn't interfere with  normal activities.    - MODERATE (4-7): interferes with normal activities (e.g., work or school) or awakens from sleep, limping.    - SEVERE (8-10): excruciating pain, unable to do any normal activities, unable to walk.      No pain right now, but at night 10/10 5. CAUSE: "What do you think caused the ankle swelling?"     No idea 6. OTHER SYMPTOMS: "Do you have any other symptoms?" (e.g., fever, chest pain, difficulty breathing, calf pain)     Right leg pain on the side above the ankle to the calf  Protocols used: Ankle Swelling-A-AH

## 2023-07-01 NOTE — Telephone Encounter (Signed)
-----   Message from Erick Colace sent at 07/01/2023  1:50 PM EDT ----- Please call to see if she plans on following up with [facility]. If not, sHe will need to see Riley Lam , pt will need to taper down on the Oxy by 1 tablet per week , ie Oxy IR 7.5 TID x 7 d BID x 7d and every day x 7d , once she gets to qday can start Butrans 7.27mcg /hr change weekly ----- Message ----- From: Doreene Eland, RN Sent: 07/01/2023  11:47 AM EDT To: Erick Colace, MD  According to the PMP she filled #[id number] oxy 7.5/325 on 7/9-same day as her visit. Looks like provider was [place] physician but says the date written was in January   Filled 06/22/2023 (Date written 01/06/2023)  Oxycodone-Acetaminophn 7.5-325 #120.00  Bettey Costa ----- Message ----- From: Erick Colace, MD Sent: 06/29/2023   2:46 PM EDT To: Doreene Eland, RN  Pt told me and I documented no Oxycodone for 2 months but high levels seen on UDS , therefore she would be non narcotic treatment only ----- Message ----- From: Interface, [facility] Lab Results In Sent: 06/24/2023   4:36 PM EDT To: Erick Colace, MD

## 2023-07-01 NOTE — Telephone Encounter (Signed)
Left VM for her to return call to the office.

## 2023-07-05 ENCOUNTER — Telehealth: Payer: Self-pay | Admitting: Registered Nurse

## 2023-07-05 NOTE — Therapy (Unsigned)
OUTPATIENT PHYSICAL THERAPY THORACOLUMBAR EVALUATION   Patient Name: Hayley Padilla MRN: [id number] DOB:1951/04/25, 72 y.o., female Today's Date: 07/06/2023  END OF SESSION:  PT End of Session - 07/06/23 1239     Visit Number 1    Number of Visits 8    Date for PT Re-Evaluation 08/31/23    Authorization Type [employer] , [facility]    PT Start Time 1145    PT Stop Time 1230    PT Time Calculation (min) 45 min    Activity Tolerance Patient tolerated treatment well;Patient limited by pain    Behavior During Therapy WFL for tasks assessed/performed             Past Medical History:  Diagnosis Date   Allergy    Arthritis    Hyperlipidemia    Hypertension    PONV (postoperative nausea and vomiting)    Past Surgical History:  Procedure Laterality Date   BACK SURGERY     lower back   IR LUMBAR DISC ASPIRATION W/IMG GUIDE  01/15/2022   LAMINECTOMY WITH POSTERIOR LATERAL ARTHRODESIS LEVEL 1 N/A 03/16/2022   Procedure: Lumbar One-Two Posterior Lateral Fusion with pedicle screws;  Surgeon: Julio Sicks, MD;  Location: [facility] OR;  Service: Neurosurgery;  Laterality: N/A;   MULTIPLE TOOTH EXTRACTIONS     Patient Active Problem List   Diagnosis Date Noted   Diabetes mellitus, type 2 (HCC) 06/24/2023   Vertebral osteomyelitis (HCC) 03/12/2022   Hypokalemia 03/12/2022   Edema of right lower extremity 01/15/2022   Essential hypertension 01/14/2022   Chronic pain 01/14/2022   HLD (hyperlipidemia) 01/14/2022   Discitis 01/14/2022   Obesity (BMI 30-39.9) 01/13/2022   S/P lumbar spinal fusion 01/13/2022   Discitis of lumbar region 01/13/2022   Kyphosis 01/13/2022   Degenerative spondylolisthesis 06/12/2019    PCP: Ricky Stabs NP   REFERRING PROVIDER: Erick Colace, MD  REFERRING DIAG:  M25.551,M25.552,G89.29 (ICD-10-CM) - Chronic hip pain, bilateral M25.561,M25.562,G89.29 (ICD-10-CM) - Chronic pain of both knees M96.1 (ICD-10-CM) - Lumbar post-laminectomy syndrome  Rationale  for Evaluation and Treatment: Rehabilitation  THERAPY DIAG:  Other low back pain  Chronic pain of right knee  Muscle weakness (generalized)  Foot drop, right  ONSET DATE: chronic  SUBJECTIVE:                                                                                                                                                                                           SUBJECTIVE STATEMENT: Patient with chronic low back pain with radiation to her Rt LE . She had a lumbar fusion and has just begun seeing [facility] She is not interested in further injections but will begin a pain patch and is also have a XR on her hip today.  Her pain is mostly when standing, is fairly comfortable in sitting.  Her Rt LE is swollen, numb in her lower leg, laterally.  She has difficulty walking, standing.  She can only wear certain shoes due to swelling.  She is unsure if she had foot drop before her surgery.  She does not wear an AFO for this.  She has had physical therapy before about a year ago.   Endorses stiffness and difficulty rising from a chair.. She stands for < 5 min with pain controlled,  after that pain increases to 9/10.  She is limited in most aspects of functional mobility.   PERTINENT HISTORY:  MD note:  1.  Lumbar postlaminectomy syndrome status post L2-L4 fusion.  She has decreased range of motion bilateral hips as well she does have mild degenerative changes seen on x-rays from approximately 1 year ago.  Will repeat. In addition she has bilateral knee pain and has had left knee replacement in the past she has a severe deformity right knee.  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 We discussed that repeat x-rays of the knees and hips will be performed. I would like to see her back in 1 month to follow-up on this. We will do urine drug screen she does have a history of responding favorably to low-dose narcotic analgesics.  Given her history of constipation would favor  Butrans patch which started at 7.5 or 5 mcg dose and titrate upward as needed.  She is in favor of this.  She may require orthopedic referral for right knee OA although she may not be comfortable in undergoing surgery at this time.    Lumbar fusion 03/2022 L2-L4 per Dr. Wynn Banker with complication/infection (back surgery x 3 total)  L TKR 2008 Diabetes (new diagnosis)  HTN  PAIN:  Are you having pain? Yes: NPRS scale: 9/10 Pain location: bilateral low back pain to Rt hip and leg  Pain description: sharp, numb in LE  Aggravating factors: activity  Relieving factors: Sitting down , heating pad a little  PRECAUTIONS: None  RED FLAGS: None   WEIGHT BEARING RESTRICTIONS: No  FALLS:  Has patient fallen in last 6 months? No  LIVING ENVIRONMENT: Lives with: lives with their son Lives in: House/apartment Stairs: Yes: Internal: 13 steps; on right going up Has following equipment at home: Dan Humphreys - 4 wheeled, Wheelchair (manual), Shower bench, and bed side commode  OCCUPATION: Retired , Corporate treasurer.   PLOF: Independent with basic ADLs, Independent with household mobility with device, Independent with community mobility with device, Requires assistive device for independence, and Needs assistance with homemaking  PATIENT GOALS: Patient would like to be able to move with less pain   NEXT MD VISIT:   OBJECTIVE:   DIAGNOSTIC FINDINGS:  X-rays ordered.  Hip and knee  PATIENT SURVEYS:  LEFS 25/80  SCREENING FOR RED FLAGS: Bowel or bladder incontinence: No Spinal tumors: No Cauda equina syndrome: No Compression fracture: No Abdominal aneurysm: No  COGNITION: Overall cognitive status: Within functional limits for tasks assessed     SENSATION: Right lower extremity can feel cold and deep pressure but decreased sensation compared to L   POSTURE: rounded shoulders, forward head, increased thoracic kyphosis, flexed trunk , and weight shift left  PALPATION: None tender to palpation in  right knee and lumbar spine  LUMBAR ROM:   AROM  eval  Flexion NT  Extension To neutral   Right lateral flexion NT   Left lateral flexion NT   Right rotation 50%   Left rotation 50%   (Blank rows = not tested)  LOWER EXTREMITY ROM:     Active  Right eval Left eval  Hip flexion [facility]  Hip extension Unable Unable  Hip abduction NT eval NT on eval  Hip adduction    Hip internal rotation [facility]   Hip external rotation tight Tight   Knee flexion 85 110  Knee extension -13 5  Ankle dorsiflexion Minimal Full  Ankle plantarflexion    Ankle inversion    Ankle eversion     (Blank rows = not tested)  LOWER EXTREMITY MMT:    MMT Right eval Left eval  Hip flexion 4 - 4  Hip extension    Hip abduction    Hip adduction    Hip internal rotation    Hip external rotation    Knee flexion 2 5  Knee extension 4 - 4+  Ankle dorsiflexion 1 5  Ankle plantarflexion    Ankle inversion    Ankle eversion     (Blank rows = not tested)  LUMBAR SPECIAL TESTS:  NT   FUNCTIONAL TESTS:  5 times sit to stand: 18. 4 sec with hands on thighs to walker   GAIT: Distance walked: 80 Assistive device utilized: Walker - 4 wheeled Level of assistance: Modified independence Comments: slides Rt foot along floor/foot drop. Leans L to assist in Rt LE swing, trunk flexion and cervical rotation, scapular elevation   TODAY'S TREATMENT:                                                                                                                              DATE: 07/06/23    PATIENT EDUCATION:  Education details: PT, HEP, posture  Person educated: Patient Education method: Explanation Education comprehension: verbalized understanding and needs further education  HOME EXERCISE PROGRAM: Access Code: [email or web address] URL: [email or web address] Date: 07/06/2023 Prepared by: Karie Mainland  Exercises - Seated Pelvic Tilts  - 1 x daily - 7 x weekly - 2 sets - 10 reps - 5 hold - Seated  Long Arc Quad  - 1 x daily - 7 x weekly - 2 sets - 10 reps - 5 hold - Sit to Stand with Counter Support  - 1 x daily - 7 x weekly - 2 sets - 10 reps - 5 hold - Seated Scapular Retraction  - 1 x daily - 7 x weekly - 2 sets - 10 reps - 5 hold  ASSESSMENT:  CLINICAL IMPRESSION: Patient is a 72 y.o. female who was seen today for physical therapy evaluation and treatment for Low back, hip and Rt knee pain.  She would benefit from an orthopedic referral for her right knee but she is not sure if is the right time.  She has difficulty standing for any length of  time due to pain in her back radiating to her right lower extremity.  We will see her once a week for 8 weeks if she continues to make efforts toward progress.    OBJECTIVE IMPAIRMENTS: Abnormal gait, decreased activity tolerance, decreased balance, decreased coordination, decreased endurance, decreased knowledge of condition, decreased mobility, difficulty walking, decreased ROM, decreased strength, increased edema, increased fascial restrictions, impaired flexibility, impaired sensation, impaired UE functional use, postural dysfunction, obesity, and pain.   ACTIVITY LIMITATIONS: carrying, lifting, sitting, standing, squatting, stairs, transfers, bed mobility, and locomotion level  PARTICIPATION LIMITATIONS: meal prep, cleaning, interpersonal relationship, driving, shopping, and community activity  PERSONAL FACTORS: Time since onset of injury/illness/exacerbation and 3+ comorbidities: Left total knee replacement, chronic pain ,right leg weakness  are also affecting patient's functional outcome.   REHAB POTENTIAL: Good  CLINICAL DECISION MAKING: Evolving/moderate complexity  EVALUATION COMPLEXITY: Moderate   GOALS: Goals reviewed with patient? Yes  SHORT TERM GOALS: Target date: 08/03/2023   Pt will be I with HEP for core, posture and mobility in general  Baseline: Given basic seated exercises on evaluation Goal status: INITIAL  2.   Pt will begin to spend more time being upright and more active throughout the day Baseline: Patient is quite sedentary Goal status: INITIAL  3.  Pt will be screened for balance and fall risk, goal set if needed Baseline: Will do next visit Goal status: INITIAL   LONG TERM GOALS: Target date: 08/31/2023    Pt will be able to improve LEFS to 35/80- to demo improved functional mobility  Baseline: 25/80 Goal status: INITIAL  2.  Patient will be able to stand for 15 minutes using her walker for home tasks with no more than moderate pain in her lower back and right leg Baseline: Unable to make it beyond 5 minutes most of the time due to pain Goal status: INITIAL  3.  Patient will be able to improve 5 times sit to stand to 15 seconds or less without using upper extremities Baseline: 18 sec with hands  Goal status: INITIAL  4.  Patient will be able to improve posture with ambulation showing improved extension and more symmetrical gait pattern from the waiting area to the gym with minimal cues Baseline: Patient flexed forward, and unable to advance right leg without leaning R  Goal status: INITIAL  5.  Patient will increase Right hamstring strength to 3+/5 Baseline: 2/5  Goal status: INITIAL   PLAN:  PT FREQUENCY: 1x/week  PT DURATION: 8 weeks  PLANNED INTERVENTIONS: Therapeutic exercises, Therapeutic activity, Neuromuscular re-education, Balance training, Gait training, Patient/Family education, Self Care, Stair training, DME instructions, Cryotherapy, Moist heat, Manual therapy, and Re-evaluation.  PLAN FOR NEXT SESSION: Home program, standing as tolerated General lower extremity and core strength. 2 min walk test, balance    Alexavier Tsutsui, PT 07/06/2023, 4:56 PM

## 2023-07-05 NOTE — Telephone Encounter (Signed)
Call placed to Ms. Hayley Padilla,  She reports she is taking her Oxycodone twice a day, she will continue with this for 7 days. Then she will decrease Her oxycodone to daily for 7 days and call office, she verbalizes understanding.

## 2023-07-06 ENCOUNTER — Ambulatory Visit: Payer: No Typology Code available for payment source | Attending: Family | Admitting: Physical Therapy

## 2023-07-06 ENCOUNTER — Other Ambulatory Visit: Payer: Self-pay | Admitting: Physical Medicine & Rehabilitation

## 2023-07-06 ENCOUNTER — Ambulatory Visit
Admission: RE | Admit: 2023-07-06 | Discharge: 2023-07-06 | Disposition: A | Discharge status: Home / Self Care | Payer: No Typology Code available for payment source | Source: Ambulatory Visit | Attending: Physical Medicine & Rehabilitation | Admitting: Physical Medicine & Rehabilitation

## 2023-07-06 ENCOUNTER — Encounter: Payer: Self-pay | Admitting: Physical Therapy

## 2023-07-06 DIAGNOSIS — M5459 Other low back pain: Secondary | ICD-10-CM | POA: Insufficient documentation

## 2023-07-06 DIAGNOSIS — M21371 Foot drop, right foot: Secondary | ICD-10-CM | POA: Diagnosis present

## 2023-07-06 DIAGNOSIS — M6281 Muscle weakness (generalized): Secondary | ICD-10-CM | POA: Insufficient documentation

## 2023-07-06 DIAGNOSIS — M25561 Pain in right knee: Secondary | ICD-10-CM | POA: Insufficient documentation

## 2023-07-06 DIAGNOSIS — M961 Postlaminectomy syndrome, not elsewhere classified: Secondary | ICD-10-CM

## 2023-07-06 DIAGNOSIS — M25562 Pain in left knee: Secondary | ICD-10-CM | POA: Insufficient documentation

## 2023-07-06 DIAGNOSIS — G8929 Other chronic pain: Secondary | ICD-10-CM | POA: Diagnosis present

## 2023-07-06 DIAGNOSIS — M25552 Pain in left hip: Secondary | ICD-10-CM | POA: Insufficient documentation

## 2023-07-06 DIAGNOSIS — M25551 Pain in right hip: Secondary | ICD-10-CM | POA: Diagnosis not present

## 2023-07-06 NOTE — Telephone Encounter (Signed)
Patient said she is doing better with taking bio freeze. Patient has up coming appointment but if she need to go to [facility] or ED before appt she would o.

## 2023-07-06 NOTE — Telephone Encounter (Signed)
Report to the Emergency Department/Urgent Care for immediate medical evaluation. Follow-up with Primary Care at discharge.

## 2023-07-14 ENCOUNTER — Encounter: Payer: Self-pay | Admitting: Family

## 2023-07-14 ENCOUNTER — Ambulatory Visit (INDEPENDENT_AMBULATORY_CARE_PROVIDER_SITE_OTHER): Payer: No Typology Code available for payment source | Admitting: Family

## 2023-07-14 VITALS — BP 126/73 | HR 78 | Temp 98.7°F | Ht 62.0 in | Wt 216.2 lb

## 2023-07-14 DIAGNOSIS — M25472 Effusion, left ankle: Secondary | ICD-10-CM | POA: Diagnosis not present

## 2023-07-14 DIAGNOSIS — G8929 Other chronic pain: Secondary | ICD-10-CM

## 2023-07-14 DIAGNOSIS — M25471 Effusion, right ankle: Secondary | ICD-10-CM | POA: Diagnosis not present

## 2023-07-14 DIAGNOSIS — E1165 Type 2 diabetes mellitus with hyperglycemia: Secondary | ICD-10-CM

## 2023-07-14 DIAGNOSIS — K5900 Constipation, unspecified: Secondary | ICD-10-CM

## 2023-07-14 DIAGNOSIS — M25561 Pain in right knee: Secondary | ICD-10-CM

## 2023-07-14 DIAGNOSIS — R600 Localized edema: Secondary | ICD-10-CM | POA: Diagnosis not present

## 2023-07-14 DIAGNOSIS — Z7984 Long term (current) use of oral hypoglycemic drugs: Secondary | ICD-10-CM

## 2023-07-14 DIAGNOSIS — I10 Essential (primary) hypertension: Secondary | ICD-10-CM | POA: Diagnosis not present

## 2023-07-14 MED ORDER — LOSARTAN POTASSIUM 100 MG PO TABS: 100.0000 mg | ORAL_TABLET | Freq: Every day | ORAL | 0 refills | Status: DC

## 2023-07-14 MED ORDER — DICLOFENAC SODIUM 75 MG PO TBEC
75.0000 mg | DELAYED_RELEASE_TABLET | Freq: Two times a day (BID) | ORAL | 2 refills | Status: DC
Start: 2023-07-14 — End: 2023-09-24

## 2023-07-14 MED ORDER — POLYETHYLENE GLYCOL 3350 17 G PO PACK
17.0000 g | PACK | Freq: Every day | ORAL | 1 refills | Status: DC | PRN
Start: 2023-07-14 — End: 2023-09-24

## 2023-07-14 MED ORDER — AMLODIPINE BESYLATE 10 MG PO TABS: 10.0000 mg | ORAL_TABLET | Freq: Every day | ORAL | 0 refills | Status: DC

## 2023-07-14 NOTE — Progress Notes (Signed)
Patient ID: Hayley Padilla, female    DOB: 09/21/1951  MRN: [id number]  CC: Chronic Conditions Management   Subjective: Hayley Padilla is a 72 y.o. female who presents for chronic conditions management.   Her concerns today include:  - Doing well on Amlodipine and Losartan, no issues/concerns. She does not complain of red flag symptoms such as but not limited to chest pain, shortness of breath, worst headache of life, nausea/vomiting.  - Bilateral feet and ankle swelling recently began. Sometimes radiates to right leg. She denies associated red flag symptoms. - Needs Diclofenac refills for chronic right knee pain. Established with Orthopedics for management of chronic conditions.  - Constipation. She denies associated red flag symptoms.  - Doing well on Metformin, no issues/concerns. She denies red flag symptoms associated with diabetes. She would like a referral to nutritionist for diet advisement. States she plans to return on next month for diabetes follow-up.  - Doing well on iron supplement. States she plans to return on next month for anemia follow-up.    Patient Active Problem List   Diagnosis Date Noted   Diabetes mellitus, type 2 (HCC) 06/24/2023   Vertebral osteomyelitis (HCC) 03/12/2022   Hypokalemia 03/12/2022   Edema of right lower extremity 01/15/2022   Essential hypertension 01/14/2022   Chronic pain 01/14/2022   HLD (hyperlipidemia) 01/14/2022   Discitis 01/14/2022   Obesity (BMI 30-39.9) 01/13/2022   S/P lumbar spinal fusion 01/13/2022   Discitis of lumbar region 01/13/2022   Kyphosis 01/13/2022   Degenerative spondylolisthesis 06/12/2019     Current Outpatient Medications on File Prior to Visit  Medication Sig Dispense Refill   cloNIDine (CATAPRES) 0.1 MG tablet Take 0.1 mg by mouth 2 (two) times daily.     furosemide (LASIX) 40 MG tablet Take 40 mg by mouth daily.     gabapentin (NEURONTIN) 300 MG capsule Take 300 mg by mouth 3 (three) times daily.     Iron,  Ferrous Sulfate, 325 (65 Fe) MG TABS Take 325 mg by mouth daily. 90 tablet 0   lovastatin (MEVACOR) 40 MG tablet Take 1 tablet (40 mg total) by mouth every evening. Hold while on Daptomycin - can resume once course completed. (Patient taking differently: Take 40 mg by mouth every evening.) 30 tablet 0   metFORMIN (GLUCOPHAGE-XR) 500 MG 24 hr tablet Take 1 tablet (500 mg total) by mouth daily with breakfast. 30 tablet 1   oxycodone-acetaminophen (LYNOX) 7.5-300 MG tablet Take 1 tablet by mouth every 4 (four) hours as needed for pain. As need most time 2x a day     potassium chloride (K-DUR) 10 MEQ tablet Take 10 mEq by mouth daily.     Vitamin D, Ergocalciferol, (DRISDOL) 1.25 MG (50000 UNIT) CAPS capsule Take 50,000 Units by mouth every Monday.     acetaminophen (TYLENOL) 650 MG CR tablet Take 650 mg by mouth every 8 (eight) hours as needed for pain. (Patient not taking: Reported on 07/14/2023)     senna-docusate (SENOKOT-S) 8.6-50 MG tablet Take 1 tablet by mouth at bedtime. (Patient not taking: Reported on 07/14/2023)     No current facility-administered medications on file prior to visit.    Allergies  Allergen Reactions   Penicillins Anaphylaxis    Did it involve swelling of the face/tongue/throat, SOB, or low BP? Yes Did it involve sudden or severe rash/hives, skin peeling, or any reaction on the inside of your mouth or nose? No Did you need to seek medical attention at a hospital  or doctor's office? Yes When did it last happen?      "Been a While"  If all above answers are "NO", may proceed with cephalosporin use.    Aspirin Other (See Comments)    brusing _ patient requested to be listed    Social History   Socioeconomic History   Marital status: Widowed    Spouse name: Not on file   Number of children: Not on file   Years of education: Not on file   Highest education level: Not on file  Occupational History   Not on file  Tobacco Use   Smoking status: Never   Smokeless  tobacco: Current    Types: Chew   Tobacco comments:    last used 03/17/2020 @ 2200  Vaping Use   Vaping status: Never Used  Substance and Sexual Activity   Alcohol use: Not Currently   Drug use: Never   Sexual activity: Not on file  Other Topics Concern   Not on file  Social History Narrative   Not on file   Social Determinants of Health   Financial Resource Strain: Not on file  Food Insecurity: Not on file  Transportation Needs: Not on file  Physical Activity: Not on file  Stress: Not on file  Social Connections: Not on file  Intimate Partner Violence: Not on file    Family History  Problem Relation Age of Onset   Colon cancer Neg Hx    Colon polyps Neg Hx    Esophageal cancer Neg Hx    Rectal cancer Neg Hx    Stomach cancer Neg Hx     Past Surgical History:  Procedure Laterality Date   BACK SURGERY     lower back   IR LUMBAR DISC ASPIRATION W/IMG GUIDE  01/15/2022   LAMINECTOMY WITH POSTERIOR LATERAL ARTHRODESIS LEVEL 1 N/A 03/16/2022   Procedure: Lumbar One-Two Posterior Lateral Fusion with pedicle screws;  Surgeon: Julio Sicks, MD;  Location: [facility] OR;  Service: Neurosurgery;  Laterality: N/A;   MULTIPLE TOOTH EXTRACTIONS      ROS: Review of Systems Negative except as stated above  PHYSICAL EXAM: BP 126/73   Pulse 78   Temp 98.7 F (37.1 C) (Oral)   Ht 5\' 2"  (1.575 m)   Wt 216 lb 3.2 oz (98.1 kg)   SpO2 97%   BMI 39.54 kg/m   Physical Exam HENT:     Head: Normocephalic and atraumatic.     Nose: Nose normal.     Mouth/Throat:     Mouth: Mucous membranes are moist.     Pharynx: Oropharynx is clear.  Eyes:     Extraocular Movements: Extraocular movements intact.     Conjunctiva/sclera: Conjunctivae normal.     Pupils: Pupils are equal, round, and reactive to light.  Cardiovascular:     Rate and Rhythm: Normal rate and regular rhythm.     Pulses: Normal pulses.     Heart sounds: Normal heart sounds.  Pulmonary:     Effort: Pulmonary effort is normal.      Breath sounds: Normal breath sounds.  Musculoskeletal:        General: Normal range of motion.     Right shoulder: Normal.     Left shoulder: Normal.     Right upper arm: Normal.     Left upper arm: Normal.     Right elbow: Normal.     Left elbow: Normal.     Right forearm: Normal.     Left forearm:  Normal.     Right wrist: Normal.     Left wrist: Normal.     Right hand: Normal.     Left hand: Normal.     Cervical back: Normal, normal range of motion and neck supple.     Thoracic back: Normal.     Lumbar back: Normal.     Right hip: Normal.     Left hip: Normal.     Right upper leg: Normal.     Left upper leg: Normal.     Right knee: Normal.     Left knee: Normal.     Right lower leg: Normal.     Left lower leg: Normal.     Right ankle: Swelling present.     Left ankle: Swelling present.     Right foot: Swelling present.     Left foot: Swelling present.     Comments: 1+ edema bilateral feet/ankles with no additional presentation.  Skin:    General: Skin is warm and dry.  Neurological:     General: No focal deficit present.     Mental Status: She is alert and oriented to person, place, and time.  Psychiatric:        Mood and Affect: Mood normal.        Behavior: Behavior normal.    ASSESSMENT AND PLAN: 1. Primary hypertension - Continue Amlodipine and Losartan as prescribed.  - Counseled on blood pressure goal of less than 140/90, low-sodium, DASH diet, medication compliance, 150 minutes of moderate intensity exercise per week as tolerated. Discussed medication compliance, adverse effects. - Follow-up with primary provider in 3 months or sooner if needed.  - amLODipine (NORVASC) 10 MG tablet; Take 1 tablet (10 mg total) by mouth daily.  Dispense: 90 tablet; Refill: 0 - losartan (COZAAR) 100 MG tablet; Take 1 tablet (100 mg total) by mouth daily.  Dispense: 90 tablet; Refill: 0  2. Edema of both feet 3. Ankle edema, bilateral - Routine screening.  - Referral to  Podiatry for further evaluation/management. During the interim follow-up with primary provider as scheduled until established with referral.  - Ambulatory referral to Podiatry - VAS [place] LOWER EXTREMITY VENOUS (DVT); Future  4. Chronic pain of right knee - Continue Diclofenac as prescribed. Counseled on medication adherence/adverse effects.  - Keep all scheduled appointments with Orthopedics.  - Follow-up with primary provider as scheduled.  - diclofenac (VOLTAREN) 75 MG EC tablet; Take 1 tablet (75 mg total) by mouth 2 (two) times daily.  Dispense: 30 tablet; Refill: 2  5. Constipation, unspecified constipation type - Polyethylene Glycol as prescribed. Counseled on medication adherence/adverse effects.  - Follow-up with primary provider in 4 weeks or sooner if needed.  - polyethylene glycol (MIRALAX / GLYCOLAX) 17 g packet; Take 17 g by mouth daily as needed.  Dispense: 30 each; Refill: 1  6. Type 2 diabetes mellitus with hyperglycemia, without long-term current use of insulin (HCC) - Continue present management.  - Referral to Medical Nutrition Therapy for further evaluation/management.  - Follow-up with primary provider as scheduled.  - Amb ref to Medical Nutrition Therapy-MNT   Patient was given the opportunity to ask questions.  Patient verbalized understanding of the plan and was able to repeat key elements of the plan. Patient was given clear instructions to go to Emergency Department or return to medical center if symptoms don't improve, worsen, or new problems develop.The patient verbalized understanding.   Orders Placed This Encounter  Procedures   Ambulatory referral to Podiatry  Amb ref to Medical Nutrition Therapy-MNT   VAS [place] LOWER EXTREMITY VENOUS (DVT)     Requested Prescriptions   Signed Prescriptions Disp Refills   amLODipine (NORVASC) 10 MG tablet 90 tablet 0    Sig: Take 1 tablet (10 mg total) by mouth daily.   losartan (COZAAR) 100 MG tablet 90 tablet 0     Sig: Take 1 tablet (100 mg total) by mouth daily.   diclofenac (VOLTAREN) 75 MG EC tablet 30 tablet 2    Sig: Take 1 tablet (75 mg total) by mouth 2 (two) times daily.   polyethylene glycol (MIRALAX / GLYCOLAX) 17 g packet 30 each 1    Sig: Take 17 g by mouth daily as needed.    Return in about 3 months (around 10/14/2023) for Follow-Up or next available chronic conditons mgmt .  Rema Fendt, NP

## 2023-07-14 NOTE — Progress Notes (Signed)
Pt has swelling and pain both feet and legs but mainly in right leg.    Pt needs laxitive due to pain medication causing constipation.   Diclofenac, Amlodopine, losartan.

## 2023-07-15 ENCOUNTER — Ambulatory Visit: Payer: No Typology Code available for payment source | Attending: Family

## 2023-07-15 DIAGNOSIS — M21371 Foot drop, right foot: Secondary | ICD-10-CM | POA: Diagnosis present

## 2023-07-15 DIAGNOSIS — M25561 Pain in right knee: Secondary | ICD-10-CM | POA: Insufficient documentation

## 2023-07-15 DIAGNOSIS — G8929 Other chronic pain: Secondary | ICD-10-CM | POA: Insufficient documentation

## 2023-07-15 DIAGNOSIS — M5459 Other low back pain: Secondary | ICD-10-CM | POA: Insufficient documentation

## 2023-07-15 DIAGNOSIS — M6281 Muscle weakness (generalized): Secondary | ICD-10-CM | POA: Diagnosis present

## 2023-07-15 NOTE — Therapy (Signed)
OUTPATIENT PHYSICAL THERAPY THORACOLUMBAR EVALUATION   Patient Name: Hayley Padilla MRN: [id number] DOB:07/09/1951, 72 y.o., female Today's Date: 07/15/2023  END OF SESSION:  PT End of Session - 07/15/23 1416     Visit Number 2    Number of Visits 8    Date for PT Re-Evaluation 08/31/23    Authorization Type [employer] , [facility]    PT Start Time [phone]    PT Stop Time 1459    PT Time Calculation (min) 42 min    Activity Tolerance Patient tolerated treatment well;Patient limited by pain    Behavior During Therapy WFL for tasks assessed/performed              Past Medical History:  Diagnosis Date   Allergy    Arthritis    Hyperlipidemia    Hypertension    PONV (postoperative nausea and vomiting)    Past Surgical History:  Procedure Laterality Date   BACK SURGERY     lower back   IR LUMBAR DISC ASPIRATION W/IMG GUIDE  01/15/2022   LAMINECTOMY WITH POSTERIOR LATERAL ARTHRODESIS LEVEL 1 N/A 03/16/2022   Procedure: Lumbar One-Two Posterior Lateral Fusion with pedicle screws;  Surgeon: Julio Sicks, MD;  Location: [facility] OR;  Service: Neurosurgery;  Laterality: N/A;   MULTIPLE TOOTH EXTRACTIONS     Patient Active Problem List   Diagnosis Date Noted   Diabetes mellitus, type 2 (HCC) 06/24/2023   Vertebral osteomyelitis (HCC) 03/12/2022   Hypokalemia 03/12/2022   Edema of right lower extremity 01/15/2022   Essential hypertension 01/14/2022   Chronic pain 01/14/2022   HLD (hyperlipidemia) 01/14/2022   Discitis 01/14/2022   Obesity (BMI 30-39.9) 01/13/2022   S/P lumbar spinal fusion 01/13/2022   Discitis of lumbar region 01/13/2022   Kyphosis 01/13/2022   Degenerative spondylolisthesis 06/12/2019    PCP: Ricky Stabs NP   REFERRING PROVIDER: Erick Colace, MD  REFERRING DIAG:  M25.551,M25.552,G89.29 (ICD-10-CM) - Chronic hip pain, bilateral M25.561,M25.562,G89.29 (ICD-10-CM) - Chronic pain of both knees M96.1 (ICD-10-CM) - Lumbar post-laminectomy  syndrome  Rationale for Evaluation and Treatment: Rehabilitation  THERAPY DIAG:  Other low back pain  Chronic pain of right knee  Muscle weakness (generalized)  Foot drop, right  ONSET DATE: chronic  SUBJECTIVE:                                                                                                                                                                                           SUBJECTIVE STATEMENT: Pt reports no change in her pain. Pt notes she has not completed her HEP consistently.  EVAL: is also have a XR on her hip  today.  Her pain is mostly when standing, is fairly comfortable in sitting.  Her Rt LE is swollen, numb in her lower leg, laterally.  She has difficulty walking, standing.  She can only wear certain shoes due to swelling.  She is unsure if she had foot drop before her surgery.  She does not wear an AFO for this.  She has had physical therapy before about a year ago.   Endorses stiffness and difficulty rising from a chair.. She stands for < 5 min with pain controlled,  after that pain increases to 9/10.  She is limited in most aspects of functional mobility.   PERTINENT HISTORY:  MD note:  1.  Lumbar postlaminectomy syndrome status post L2-L4 fusion.  She has decreased range of motion bilateral hips as well she does have mild degenerative changes seen on x-rays from approximately 1 year ago.  Will repeat. In addition she has bilateral knee pain and has had left knee replacement in the past she has a severe deformity right knee.  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 We discussed that repeat x-rays of the knees and hips will be performed. I would like to see her back in 1 month to follow-up on this. We will do urine drug screen she does have a history of responding favorably to low-dose narcotic analgesics.  Given her history of constipation would favor Butrans patch which started at 7.5 or 5 mcg dose and titrate upward as needed.  She is in favor of this.   She may require orthopedic referral for right knee OA although she may not be comfortable in undergoing surgery at this time.    Lumbar fusion 03/2022 L2-L4 per Dr. Wynn Banker with complication/infection (back surgery x 3 total)  L TKR 2008 Diabetes (new diagnosis)  HTN  PAIN:  Are you having pain? Yes: NPRS scale: 9/10 Pain location: bilateral low back pain to Rt hip and leg  Pain description: sharp, numb in LE  Aggravating factors: activity  Relieving factors: Sitting down , heating pad a little  PRECAUTIONS: None  RED FLAGS: None   WEIGHT BEARING RESTRICTIONS: No  FALLS:  Has patient fallen in last 6 months? No  LIVING ENVIRONMENT: Lives with: lives with their son Lives in: House/apartment Stairs: Yes: Internal: 13 steps; on right going up Has following equipment at home: Dan Humphreys - 4 wheeled, Wheelchair (manual), Shower bench, and bed side commode  OCCUPATION: Retired , Corporate treasurer.   PLOF: Independent with basic ADLs, Independent with household mobility with device, Independent with community mobility with device, Requires assistive device for independence, and Needs assistance with homemaking  PATIENT GOALS: Patient would like to be able to move with less pain   NEXT MD VISIT:   OBJECTIVE:   DIAGNOSTIC FINDINGS:  X-rays ordered.  Hip and knee  PATIENT SURVEYS:  LEFS 25/80  SCREENING FOR RED FLAGS: Bowel or bladder incontinence: No Spinal tumors: No Cauda equina syndrome: No Compression fracture: No Abdominal aneurysm: No  COGNITION: Overall cognitive status: Within functional limits for tasks assessed     SENSATION: Right lower extremity can feel cold and deep pressure but decreased sensation compared to L   POSTURE: rounded shoulders, forward head, increased thoracic kyphosis, flexed trunk , and weight shift left  PALPATION: None tender to palpation in right knee and lumbar spine  LUMBAR ROM:   AROM eval  Flexion NT  Extension To neutral   Right  lateral flexion NT   Left lateral flexion NT   Right  rotation 50%   Left rotation 50%   (Blank rows = not tested)  LOWER EXTREMITY ROM:     Active  Right eval Left eval  Hip flexion [facility]  Hip extension Unable Unable  Hip abduction NT eval NT on eval  Hip adduction    Hip internal rotation [facility]   Hip external rotation tight Tight   Knee flexion 85 110  Knee extension -13 5  Ankle dorsiflexion Minimal Full  Ankle plantarflexion    Ankle inversion    Ankle eversion     (Blank rows = not tested)  LOWER EXTREMITY MMT:    MMT Right eval Left eval  Hip flexion 4 - 4  Hip extension    Hip abduction    Hip adduction    Hip internal rotation    Hip external rotation    Knee flexion 2 5  Knee extension 4 - 4+  Ankle dorsiflexion 1 5  Ankle plantarflexion    Ankle inversion    Ankle eversion     (Blank rows = not tested)  LUMBAR SPECIAL TESTS:  NT   FUNCTIONAL TESTS:  5 times sit to stand: 18. 4 sec with hands on thighs to walker   GAIT: Distance walked: 80 Assistive device utilized: Walker - 4 wheeled Level of assistance: Modified independence Comments: slides Rt foot along floor/foot drop. Leans L to assist in Rt LE swing, trunk flexion and cervical rotation, scapular elevation   TODAY'S TREATMENT:  [facility] Adult PT Treatment:                                                DATE: 07/15/23 Therapeutic Exercise: Seated Pelvic Tilts c ball press x10 5" Seated Long Arc Quad  2x10 5" Sit to Stand to RW 2x5  Seated Scapular Retraction  2x10 RTB Hip abd 2x10 RTB Seated trunk flexion forward c rollator assist Updated HEP                                                                                                                       DATE: 07/06/23    PATIENT EDUCATION:  Education details: PT, HEP, posture  Person educated: Patient Education method: Explanation Education comprehension: verbalized understanding and needs further education  HOME EXERCISE  PROGRAM: Access Code: [email or web address] URL: [email or web address] Date: 07/15/2023 Prepared by: Joellyn Rued  Exercises - Seated Pelvic Tilts  - 1 x daily - 7 x weekly - 2 sets - 10 reps - 5 hold - Seated Long Arc Quad  - 1 x daily - 7 x weekly - 2 sets - 10 reps - 5 hold - Sit to Stand with Counter Support  - 1 x daily - 7 x weekly - 2 sets - 10 reps - 5 hold - Seated Scapular Retraction  - 1 x daily - 7 x weekly - 2  sets - 10 reps - 5 hold - Standing Shoulder Horizontal Abduction with Resistance  - 1 x daily - 7 x weekly - 2 sets - 10 reps - 3 hold - Seated Hip Abduction  - 1 x daily - 7 x weekly - 2 sets - 10 reps - 3 hold - Standing Cervical Rotation AROM  - 1 x daily - 7 x weekly - 2 sets - 5 reps - 10 hold  ASSESSMENT:  CLINICAL IMPRESSION:  PT was completed for lumbopelvic flexibility and strengthening, posterior chain strengthening and cervical ROM. Verbal and tactile cueing was provided for proper completion. HEP was updated. Pt tolerated prescribed therex without adverse effects. Pt was encouraged to complete at least half of her HEP daily. Pt will continue to benefit from skilled PT to address impairments for improved function.    EVAL: Patient is a 72 y.o. female who was seen today for physical therapy evaluation and treatment for Low back, hip and Rt knee pain.  She would benefit from an orthopedic referral for her right knee but she is not sure if is the right time.  She has difficulty standing for any length of time due to pain in her back radiating to her right lower extremity.  We will see her once a week for 8 weeks if she continues to make efforts toward progress.    OBJECTIVE IMPAIRMENTS: Abnormal gait, decreased activity tolerance, decreased balance, decreased coordination, decreased endurance, decreased knowledge of condition, decreased mobility, difficulty walking, decreased ROM, decreased strength, increased edema, increased fascial restrictions, impaired  flexibility, impaired sensation, impaired UE functional use, postural dysfunction, obesity, and pain.   ACTIVITY LIMITATIONS: carrying, lifting, sitting, standing, squatting, stairs, transfers, bed mobility, and locomotion level  PARTICIPATION LIMITATIONS: meal prep, cleaning, interpersonal relationship, driving, shopping, and community activity  PERSONAL FACTORS: Time since onset of injury/illness/exacerbation and 3+ comorbidities: Left total knee replacement, chronic pain ,right leg weakness  are also affecting patient's functional outcome.   REHAB POTENTIAL: Good  CLINICAL DECISION MAKING: Evolving/moderate complexity  EVALUATION COMPLEXITY: Moderate   GOALS: Goals reviewed with patient? Yes  SHORT TERM GOALS: Target date: 08/03/2023   Pt will be I with HEP for core, posture and mobility in general  Baseline: Given basic seated exercises on evaluation Goal status: INITIAL  2.  Pt will begin to spend more time being upright and more active throughout the day Baseline: Patient is quite sedentary Goal status: INITIAL  3.  Pt will be screened for balance and fall risk, goal set if needed Baseline: Will do next visit Goal status: INITIAL   LONG TERM GOALS: Target date: 08/31/2023    Pt will be able to improve LEFS to 35/80- to demo improved functional mobility  Baseline: 25/80 Goal status: INITIAL  2.  Patient will be able to stand for 15 minutes using her walker for home tasks with no more than moderate pain in her lower back and right leg Baseline: Unable to make it beyond 5 minutes most of the time due to pain Goal status: INITIAL  3.  Patient will be able to improve 5 times sit to stand to 15 seconds or less without using upper extremities Baseline: 18 sec with hands  Goal status: INITIAL  4.  Patient will be able to improve posture with ambulation showing improved extension and more symmetrical gait pattern from the waiting area to the gym with minimal  cues Baseline: Patient flexed forward, and unable to advance right leg without leaning R  Goal  status: INITIAL  5.  Patient will increase Right hamstring strength to 3+/5 Baseline: 2/5  Goal status: INITIAL   PLAN:  PT FREQUENCY: 1x/week  PT DURATION: 8 weeks  PLANNED INTERVENTIONS: Therapeutic exercises, Therapeutic activity, Neuromuscular re-education, Balance training, Gait training, Patient/Family education, Self Care, Stair training, DME instructions, Cryotherapy, Moist heat, Manual therapy, and Re-evaluation.  PLAN FOR NEXT SESSION: Home program, standing as tolerated General lower extremity and core strength. 2 min walk test, balance   Joellyn Rued MS, PT 07/15/23 3:09 PM

## 2023-07-20 NOTE — Therapy (Signed)
OUTPATIENT PHYSICAL THERAPY THORACOLUMBAR EVALUATION   Patient Name: Hayley Padilla MRN: [id number] DOB:04-10-1951, 72 y.o., female Today's Date: 07/21/2023  END OF SESSION:  PT End of Session - 07/21/23 1455     Visit Number 3    Number of Visits 8    Date for PT Re-Evaluation 08/31/23    Authorization Type [employer] , [employer]    PT Start Time 1330    PT Stop Time 1420    PT Time Calculation (min) 50 min    Activity Tolerance Patient tolerated treatment well;Patient limited by pain    Behavior During Therapy WFL for tasks assessed/performed               Past Medical History:  Diagnosis Date   Allergy    Arthritis    Hyperlipidemia    Hypertension    PONV (postoperative nausea and vomiting)    Past Surgical History:  Procedure Laterality Date   BACK SURGERY     lower back   IR LUMBAR DISC ASPIRATION W/IMG GUIDE  01/15/2022   LAMINECTOMY WITH POSTERIOR LATERAL ARTHRODESIS LEVEL 1 N/A 03/16/2022   Procedure: Lumbar One-Two Posterior Lateral Fusion with pedicle screws;  Surgeon: Julio Sicks, MD;  Location: [facility] OR;  Service: Neurosurgery;  Laterality: N/A;   MULTIPLE TOOTH EXTRACTIONS     Patient Active Problem List   Diagnosis Date Noted   Diabetes mellitus, type 2 (HCC) 06/24/2023   Vertebral osteomyelitis (HCC) 03/12/2022   Hypokalemia 03/12/2022   Edema of right lower extremity 01/15/2022   Essential hypertension 01/14/2022   Chronic pain 01/14/2022   HLD (hyperlipidemia) 01/14/2022   Discitis 01/14/2022   Obesity (BMI 30-39.9) 01/13/2022   S/P lumbar spinal fusion 01/13/2022   Discitis of lumbar region 01/13/2022   Kyphosis 01/13/2022   Degenerative spondylolisthesis 06/12/2019    PCP: Ricky Stabs NP   REFERRING PROVIDER: Erick Colace, MD  REFERRING DIAG:  M25.551,M25.552,G89.29 (ICD-10-CM) - Chronic hip pain, bilateral M25.561,M25.562,G89.29 (ICD-10-CM) - Chronic pain of both knees M96.1 (ICD-10-CM) - Lumbar post-laminectomy  syndrome  Rationale for Evaluation and Treatment: Rehabilitation  THERAPY DIAG:  Other low back pain  Muscle weakness (generalized)  ONSET DATE: chronic  SUBJECTIVE:                                                                                                                                                                                           SUBJECTIVE STATEMENT: Pt reports her her upper gluteal pain is worse today with the rainy weather coming in. Pt reports completing her HEP daily which has been helpful for the upper shoulders and back.  EVAL: is  also have a XR on her hip today.  Her pain is mostly when standing, is fairly comfortable in sitting.  Her Rt LE is swollen, numb in her lower leg, laterally.  She has difficulty walking, standing.  She can only wear certain shoes due to swelling.  She is unsure if she had foot drop before her surgery.  She does not wear an AFO for this.  She has had physical therapy before about a year ago.   Endorses stiffness and difficulty rising from a chair.. She stands for < 5 min with pain controlled,  after that pain increases to 9/10.  She is limited in most aspects of functional mobility.   PERTINENT HISTORY:  MD note:  1.  Lumbar postlaminectomy syndrome status post L2-L4 fusion.  She has decreased range of motion bilateral hips as well she does have mild degenerative changes seen on x-rays from approximately 1 year ago.  Will repeat. In addition she has bilateral knee pain and has had left knee replacement in the past she has a severe deformity right knee.  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 We discussed that repeat x-rays of the knees and hips will be performed. I would like to see her back in 1 month to follow-up on this. We will do urine drug screen she does have a history of responding favorably to low-dose narcotic analgesics.  Given her history of constipation would favor Butrans patch which started at 7.5 or 5 mcg dose and titrate  upward as needed.  She is in favor of this.  She may require orthopedic referral for right knee OA although she may not be comfortable in undergoing surgery at this time.    Lumbar fusion 03/2022 L2-L4 per Dr. Wynn Banker with complication/infection (back surgery x 3 total)  L TKR 2008 Diabetes (new diagnosis)  HTN  PAIN:  Are you having pain? Yes: NPRS scale: 10/10 Pain location: bilateral low back pain to Rt hip and leg  Pain description: sharp, numb in LE  Aggravating factors: activity  Relieving factors: Sitting down , heating pad a little  PRECAUTIONS: None  RED FLAGS: None   WEIGHT BEARING RESTRICTIONS: No  FALLS:  Has patient fallen in last 6 months? No  LIVING ENVIRONMENT: Lives with: lives with their son Lives in: House/apartment Stairs: Yes: Internal: 13 steps; on right going up Has following equipment at home: Dan Humphreys - 4 wheeled, Wheelchair (manual), Shower bench, and bed side commode  OCCUPATION: Retired , Corporate treasurer.   PLOF: Independent with basic ADLs, Independent with household mobility with device, Independent with community mobility with device, Requires assistive device for independence, and Needs assistance with homemaking  PATIENT GOALS: Patient would like to be able to move with less pain   NEXT MD VISIT:   OBJECTIVE:   DIAGNOSTIC FINDINGS:  X-rays ordered.  Hip and knee  PATIENT SURVEYS:  LEFS 25/80  SCREENING FOR RED FLAGS: Bowel or bladder incontinence: No Spinal tumors: No Cauda equina syndrome: No Compression fracture: No Abdominal aneurysm: No  COGNITION: Overall cognitive status: Within functional limits for tasks assessed     SENSATION: Right lower extremity can feel cold and deep pressure but decreased sensation compared to L   POSTURE: rounded shoulders, forward head, increased thoracic kyphosis, flexed trunk , and weight shift left  PALPATION: None tender to palpation in right knee and lumbar spine  LUMBAR ROM:   AROM eval   Flexion NT  Extension To neutral   Right lateral flexion NT  Left lateral flexion NT   Right rotation 50%   Left rotation 50%   (Blank rows = not tested)  LOWER EXTREMITY ROM:     Active  Right eval Left eval  Hip flexion [facility]  Hip extension Unable Unable  Hip abduction NT eval NT on eval  Hip adduction    Hip internal rotation [facility]   Hip external rotation tight Tight   Knee flexion 85 110  Knee extension -13 5  Ankle dorsiflexion Minimal Full  Ankle plantarflexion    Ankle inversion    Ankle eversion     (Blank rows = not tested)  LOWER EXTREMITY MMT:    MMT Right eval Left eval  Hip flexion 4 - 4  Hip extension    Hip abduction    Hip adduction    Hip internal rotation    Hip external rotation    Knee flexion 2 5  Knee extension 4 - 4+  Ankle dorsiflexion 1 5  Ankle plantarflexion    Ankle inversion    Ankle eversion     (Blank rows = not tested)  LUMBAR SPECIAL TESTS:  NT   FUNCTIONAL TESTS:  5 times sit to stand: 18. 4 sec with hands on thighs to walker   GAIT: Distance walked: 80 Assistive device utilized: Walker - 4 wheeled Level of assistance: Modified independence Comments: slides Rt foot along floor/foot drop. Leans L to assist in Rt LE swing, trunk flexion and cervical rotation, scapular elevation   TODAY'S TREATMENT:  [facility] Adult PT Treatment:                                                DATE: 07/21/23 Therapeutic Exercise: Shoulder hor abd start pattern x5 RTB Shoulder ER x15 RTB Seated shoulder row x15 RTB Seated hamstring stretch  Seated QL stretch Seated cervical rotation Seated trunk flexion forward c rollator assist Seated Pelvic Tilts c ball press x10 5" Seated Long Arc Quad  2x10 5" Sit to Stand to RW 2x5  Hip abd 2x10 RTB Modalities: MH to the low back and upper gluteal region x15 mins  [facility] Adult PT Treatment:                                                DATE: 07/15/23 Therapeutic Exercise: Seated Pelvic Tilts c  ball press x10 5" Seated Long Arc Quad  2x10 5" Sit to Stand to RW 2x5  Seated Scapular Retraction  2x10 RTB Hip abd 2x10 RTB Seated trunk flexion forward c rollator assist Updated HEP  DATE: 07/06/23    PATIENT EDUCATION:  Education details: PT, HEP, posture  Person educated: Patient Education method: Explanation Education comprehension: verbalized understanding and needs further education  HOME EXERCISE PROGRAM: Access Code: [email or web address] URL: [email or web address] Date: 07/21/2023 Prepared by: Joellyn Rued  Exercises - Seated Pelvic Tilts  - 1 x daily - 7 x weekly - 2 sets - 10 reps - 5 hold - Seated Long Arc Quad  - 1 x daily - 7 x weekly - 2 sets - 10 reps - 5 hold - Sit to Stand with Counter Support  - 1 x daily - 7 x weekly - 2 sets - 10 reps - 5 hold - Seated Scapular Retraction  - 1 x daily - 7 x weekly - 2 sets - 10 reps - 5 hold - Standing Shoulder Horizontal Abduction with Resistance  - 1 x daily - 7 x weekly - 2 sets - 10 reps - 3 hold - Seated Hip Abduction  - 1 x daily - 7 x weekly - 2 sets - 10 reps - 3 hold - Standing Cervical Rotation AROM  - 1 x daily - 7 x weekly - 2 sets - 5 reps - 10 hold - Seated Flexion Stretch with Swiss Ball  - 1 x daily - 7 x weekly - 1 sets - 10 reps - 5 hold  ASSESSMENT:  CLINICAL IMPRESSION:  PT was completed for lumbopelvic flexibility and strengthening, posterior chain strengthening and cervical ROM. Verbal and tactile cueing was provided for proper completion. HEP was updated. Pt tolerated prescribed therex without adverse effects. Pt reports improved consistency with the completion of her HEP. Progress will be slow secondary to pt's decreased physical condition. Moist heat was provided at the end of the PT session to assist with pain relief. Pt will continue to benefit from skilled PT to address impairments  for improved function.    EVAL: Patient is a 72 y.o. female who was seen today for physical therapy evaluation and treatment for Low back, hip and Rt knee pain.  She would benefit from an orthopedic referral for her right knee but she is not sure if is the right time.  She has difficulty standing for any length of time due to pain in her back radiating to her right lower extremity.  We will see her once a week for 8 weeks if she continues to make efforts toward progress.    OBJECTIVE IMPAIRMENTS: Abnormal gait, decreased activity tolerance, decreased balance, decreased coordination, decreased endurance, decreased knowledge of condition, decreased mobility, difficulty walking, decreased ROM, decreased strength, increased edema, increased fascial restrictions, impaired flexibility, impaired sensation, impaired UE functional use, postural dysfunction, obesity, and pain.   ACTIVITY LIMITATIONS: carrying, lifting, sitting, standing, squatting, stairs, transfers, bed mobility, and locomotion level  PARTICIPATION LIMITATIONS: meal prep, cleaning, interpersonal relationship, driving, shopping, and community activity  PERSONAL FACTORS: Time since onset of injury/illness/exacerbation and 3+ comorbidities: Left total knee replacement, chronic pain ,right leg weakness  are also affecting patient's functional outcome.   REHAB POTENTIAL: Good  CLINICAL DECISION MAKING: Evolving/moderate complexity  EVALUATION COMPLEXITY: Moderate   GOALS: Goals reviewed with patient? Yes  SHORT TERM GOALS: Target date: 08/03/2023   Pt will be I with HEP for core, posture and mobility in general  Baseline: Given basic seated exercises on evaluation Goal status: Ongoing  2.  Pt will begin to spend more time being upright and more active throughout the day Baseline: Patient is quite sedentary Goal status: Ongoing  3.  Pt will be screened for balance and fall risk, goal set if needed Baseline: Will do next visit Goal  status: INITIAL   LONG TERM GOALS: Target date: 08/31/2023    Pt will be able to improve LEFS to 35/80- to demo improved functional mobility  Baseline: 25/80 Goal status: INITIAL  2.  Patient will be able to stand for 15 minutes using her walker for home tasks with no more than moderate pain in her lower back and right leg Baseline: Unable to make it beyond 5 minutes most of the time due to pain Goal status: INITIAL  3.  Patient will be able to improve 5 times sit to stand to 15 seconds or less without using upper extremities Baseline: 18 sec with hands  Goal status: INITIAL  4.  Patient will be able to improve posture with ambulation showing improved extension and more symmetrical gait pattern from the waiting area to the gym with minimal cues Baseline: Patient flexed forward, and unable to advance right leg without leaning R  Goal status: INITIAL  5.  Patient will increase Right hamstring strength to 3+/5 Baseline: 2/5  Goal status: INITIAL   PLAN:  PT FREQUENCY: 1x/week  PT DURATION: 8 weeks  PLANNED INTERVENTIONS: Therapeutic exercises, Therapeutic activity, Neuromuscular re-education, Balance training, Gait training, Patient/Family education, Self Care, Stair training, DME instructions, Cryotherapy, Moist heat, Manual therapy, and Re-evaluation.  PLAN FOR NEXT SESSION: Home program, standing as tolerated General lower extremity and core strength. 2 min walk test, balance   [place] Ahtziri Jeffries MS, PT 07/21/23 3:00 PM

## 2023-07-21 ENCOUNTER — Ambulatory Visit (HOSPITAL_COMMUNITY)
Admission: RE | Admit: 2023-07-21 | Discharge: 2023-07-21 | Disposition: A | Discharge status: Home / Self Care | Payer: No Typology Code available for payment source | Source: Ambulatory Visit | Attending: Family | Admitting: Family

## 2023-07-21 ENCOUNTER — Ambulatory Visit: Payer: No Typology Code available for payment source

## 2023-07-21 DIAGNOSIS — M6281 Muscle weakness (generalized): Secondary | ICD-10-CM

## 2023-07-21 DIAGNOSIS — R600 Localized edema: Secondary | ICD-10-CM | POA: Diagnosis not present

## 2023-07-21 DIAGNOSIS — M25472 Effusion, left ankle: Secondary | ICD-10-CM | POA: Insufficient documentation

## 2023-07-21 DIAGNOSIS — M5459 Other low back pain: Secondary | ICD-10-CM | POA: Diagnosis not present

## 2023-07-21 DIAGNOSIS — M25471 Effusion, right ankle: Secondary | ICD-10-CM | POA: Insufficient documentation

## 2023-07-22 ENCOUNTER — Encounter
Payer: No Typology Code available for payment source | Attending: Physical Medicine & Rehabilitation | Admitting: Physical Medicine & Rehabilitation

## 2023-07-22 ENCOUNTER — Encounter: Payer: Self-pay | Admitting: Physical Medicine & Rehabilitation

## 2023-07-22 VITALS — BP 130/75 | HR 87 | Ht 62.0 in | Wt 215.0 lb

## 2023-07-22 DIAGNOSIS — M4310 Spondylolisthesis, site unspecified: Secondary | ICD-10-CM | POA: Insufficient documentation

## 2023-07-22 MED ORDER — BUPRENORPHINE 10 MCG/HR TD PTWK: 1.0000 | MEDICATED_PATCH | TRANSDERMAL | 1 refills | Status: DC

## 2023-07-22 NOTE — Patient Instructions (Signed)
Please call in 1 wk if new patch is not helping

## 2023-07-22 NOTE — Progress Notes (Signed)
Subjective:    Patient ID: Hayley Padilla, female    DOB: Dec 03, 1951, 72 y.o.   MRN: [id number]  HPI 72 year old female with history of lumbar fusion who developed osteomyelitis approximately 1 year ago.  She was treated with IV antibiotics had lumbar fusion L1 to due to vertebral collapse related to her osteomyelitis.  She is now free of infection.  She has continued low back pain.  She has no significant pain going down her lower extremities.  She does have bilateral knee pain as well due to OA. She was originally seen at [facility] pain center for chronic pain management had been on oxycodone 10 mg 4 times daily.  Because of insurance issues she was referred to this office. She has been reduced on her oxycodone dose to 10 mg twice daily.  She does not feel like this is as effective as the 4 times daily dosing but is able to perform her physical therapy.  We discussed the goal of going on a buprenorphine patch in terms of favorable side effect profile including decreased fall risk as well as decrease risk of constipation. MRI LUMBAR SPINE WITHOUT AND WITH CONTRAST   TECHNIQUE: Multiplanar and multiecho pulse sequences of the lumbar spine were obtained without and with intravenous contrast.   CONTRAST:  9.69mL GADAVIST GADOBUTROL 1 MMOL/ML IV SOLN   COMPARISON:  CT from earlier the same day as well as previous MRI from 01/10/2022.   FINDINGS: Segmentation: Standard. Lowest well-formed disc space labeled the L5-S1 level.   Alignment: Straightening with mild reversal of the normal lumbar lordosis. 3 mm anterolisthesis of L4 on L5.   Vertebrae: Susceptibility artifact related to prior PLIF at L2 through L4. Persistent findings of osteomyelitis discitis at L1-2 including disc space height loss, endplate irregularity, and fluid signal intensity within the L1-2 interspace. Associated discal enhancement. Overall, these changes have mildly progressed from prior. Persistent area of heterogeneous  signal involving the left ventral epidural space measuring 1.4 x 0.9 x 2.3 cm, suspicious for phlegmon if not frank abscess. This is similar to previous. However, probable small posterior component involving the dorsal epidural space now seen measuring approximately 8 x 6 x 13 mm (series 8, image 12). Resultant moderate to severe spinal stenosis is stable to perhaps slightly worsened from prior.   No other evidence for acute infection elsewhere within the lumbar spine. Vertebral body height maintained without acute or interval fracture. Bone marrow signal intensity otherwise normal. No worrisome osseous lesions.   Conus medullaris and cauda equina: Conus extends to the L1 level. Conus and cauda equina appear normal.   Paraspinal and other soft tissues: Postoperative changes present throughout the posterior paraspinous soft tissues. Benign-appearing postoperative collection at the laminectomy site of L3-4 measures 2.7 x 1.4 x 3.2 cm, likely a benign postoperative seroma similar to previous. 4.7 cm simple left renal cyst again noted, benign in appearance, no follow-up imaging recommended.   Disc levels:   L1-2: Persistent changes of osteomyelitis discitis with disc space height loss, endplate irregularity, and enhancement. Epidural phlegmon within the left ventral epidural space measures 1.4 x 0.9 x 2.3 cm. Probable small posterior collection measures up to 13 mm. Superimposed bilateral facet hypertrophy. Resultant moderate to severe spinal stenosis, stable to perhaps slightly worsened from prior. Narrowing of the left lateral recess by the ventral epidural phlegmon, potentially affecting the descending left L2 nerve root. Mild bilateral L1 foraminal stenosis.   L2-3:  Prior PLIF.  No residual canal or foraminal stenosis.  L3-4:  Prior PLIF.  No residual canal or foraminal stenosis.   L4-5: Trace anterolisthesis. Disc bulge with disc desiccation. Prior posterior decompression.  No residual spinal stenosis. Foramina appear patent.   L5-S1: Disc desiccation with mild disc bulge. Moderate facet hypertrophy. Prior posterior decompression. Residual mild lateral recess stenosis. Mild to moderate bilateral L5 foraminal narrowing. Appearance is stable.   IMPRESSION: 1. Persistent changes of osteomyelitis discitis at L1-2 with similar associated left ventral epidural phlegmon, and probable new small posterior component as above. Resultant moderate to severe spinal stenosis is stable to perhaps slightly worsened from prior. 2. No other evidence for new or distant infection within the lumbar spine. 3. No MRI evidence for fracture or other acute traumatic injury status post recent fall. 4. Stable postsurgical changes at L2-3 through L5-S1. No new or progressive stenosis.     Electronically Signed   By: Rise Mu M.D.   On: 03/12/2022 02:20 Pain Inventory Average Pain 9 Pain Right Now 10 My pain is constant and sharp  In the last 24 hours, has pain interfered with the following? General activity 4 Relation with others 0 Enjoyment of life 4 What TIME of day is your pain at its worst? varies Sleep (in general) Fair  Pain is worse with: walking and standing Pain improves with: medication Relief from Meds:  Fair  Family History  Problem Relation Age of Onset   Colon cancer Neg Hx    Colon polyps Neg Hx    Esophageal cancer Neg Hx    Rectal cancer Neg Hx    Stomach cancer Neg Hx    Social History   Socioeconomic History   Marital status: Widowed    Spouse name: Not on file   Number of children: Not on file   Years of education: Not on file   Highest education level: Not on file  Occupational History   Not on file  Tobacco Use   Smoking status: Never   Smokeless tobacco: Current    Types: Chew   Tobacco comments:    last used 03/17/2020 @ 2200  Vaping Use   Vaping status: Never Used  Substance and Sexual Activity   Alcohol use: Not  Currently   Drug use: Never   Sexual activity: Not on file  Other Topics Concern   Not on file  Social History Narrative   Not on file   Social Determinants of Health   Financial Resource Strain: Not on file  Food Insecurity: Not on file  Transportation Needs: Not on file  Physical Activity: Not on file  Stress: Not on file  Social Connections: Not on file   Past Surgical History:  Procedure Laterality Date   BACK SURGERY     lower back   IR LUMBAR DISC ASPIRATION W/IMG GUIDE  01/15/2022   LAMINECTOMY WITH POSTERIOR LATERAL ARTHRODESIS LEVEL 1 N/A 03/16/2022   Procedure: Lumbar One-Two Posterior Lateral Fusion with pedicle screws;  Surgeon: Julio Sicks, MD;  Location: [facility] OR;  Service: Neurosurgery;  Laterality: N/A;   MULTIPLE TOOTH EXTRACTIONS     Past Surgical History:  Procedure Laterality Date   BACK SURGERY     lower back   IR LUMBAR DISC ASPIRATION W/IMG GUIDE  01/15/2022   LAMINECTOMY WITH POSTERIOR LATERAL ARTHRODESIS LEVEL 1 N/A 03/16/2022   Procedure: Lumbar One-Two Posterior Lateral Fusion with pedicle screws;  Surgeon: Julio Sicks, MD;  Location: [facility] OR;  Service: Neurosurgery;  Laterality: N/A;   MULTIPLE TOOTH EXTRACTIONS  Past Medical History:  Diagnosis Date   Allergy    Arthritis    Hyperlipidemia    Hypertension    PONV (postoperative nausea and vomiting)    There were no vitals taken for this visit.  Opioid Risk Score:   Fall Risk Score:  `1  Depression screen PHQ 2/9     07/14/2023    1:51 PM 02/25/2022    9:04 AM 02/17/2022    2:15 PM 01/13/2022    2:35 PM  Depression screen PHQ 2/9  Decreased Interest 3 0 0 0  Down, Depressed, Hopeless 3 0 0 0  PHQ - 2 Score 6 0 0 0  Altered sleeping 0     Tired, decreased energy 3     Change in appetite 2     Feeling bad or failure about yourself  0     Trouble concentrating 0     Moving slowly or fidgety/restless 0     Suicidal thoughts 0     PHQ-9 Score 11     Difficult doing work/chores Somewhat  difficult       Review of Systems  Musculoskeletal:  Positive for back pain and gait problem.  All other systems reviewed and are negative.      Objective:   Physical Exam General No acute distress Mood and affect are propria Extremities without edema Valgus deformities at the knees bilaterally. Lumbar range of motion is reduced to about 50% flexion extension and 25% lateral bending. She has a forward flexed posture ambulates with a walker no evidence of toe drag or instability Lower extremity strength is 4/5 in hip flexor and extensor and close flexor Hip range of motion reduced bilaterally internal rotation greater than external.  No pain with knee range of motion.  Positive thigh thrust in the low back area bilaterally.  Negative Pearlean Brownie on the left side positive Pearlean Brownie on the right side in the SI joint area.        Assessment & Plan:  Chronic low back pain history of lumbar fusion as well as history of discitis.  Her pain is mostly in the lower lumbar area.  2 positive provocative maneuvers for SI joint pain on the right side and 1 on the left side.  No clear-cut radicular symptoms. Continue transitioning to Butrans patch will use 10 mcg rather than 5 given her history of chronic narcotic analgesic use.  We discussed instructions on how to use this medication. We will see her back in 1 month may need some further dosing adjustment to 15 or 20 mcg patch.  Butrans

## 2023-07-27 ENCOUNTER — Telehealth: Payer: Self-pay | Admitting: *Deleted

## 2023-07-27 ENCOUNTER — Ambulatory Visit: Payer: 59 | Admitting: Family

## 2023-07-27 NOTE — Therapy (Signed)
OUTPATIENT PHYSICAL THERAPY THORACOLUMBAR TREATMENT NOTE   Patient Name: Hayley Padilla MRN: [id number] DOB:1951/01/08, 72 y.o., female Today's Date: 07/28/2023  END OF SESSION:  PT End of Session - 07/28/23 1423     Visit Number 4    Number of Visits 8    Date for PT Re-Evaluation 08/31/23    Authorization Type [employer] , [employer]    PT Start Time 1420    PT Stop Time 1500    PT Time Calculation (min) 40 min    Activity Tolerance Patient tolerated treatment well;Patient limited by pain    Behavior During Therapy WFL for tasks assessed/performed                Past Medical History:  Diagnosis Date   Allergy    Arthritis    Hyperlipidemia    Hypertension    PONV (postoperative nausea and vomiting)    Past Surgical History:  Procedure Laterality Date   BACK SURGERY     lower back   IR LUMBAR DISC ASPIRATION W/IMG GUIDE  01/15/2022   LAMINECTOMY WITH POSTERIOR LATERAL ARTHRODESIS LEVEL 1 N/A 03/16/2022   Procedure: Lumbar One-Two Posterior Lateral Fusion with pedicle screws;  Surgeon: Julio Sicks, MD;  Location: [facility];  Service: Neurosurgery;  Laterality: N/A;   MULTIPLE TOOTH EXTRACTIONS     Patient Active Problem List   Diagnosis Date Noted   Diabetes mellitus, type 2 (HCC) 06/24/2023   Vertebral osteomyelitis (HCC) 03/12/2022   Hypokalemia 03/12/2022   Edema of right lower extremity 01/15/2022   Essential hypertension 01/14/2022   Chronic pain 01/14/2022   HLD (hyperlipidemia) 01/14/2022   Discitis 01/14/2022   Obesity (BMI 30-39.9) 01/13/2022   S/P lumbar spinal fusion 01/13/2022   Discitis of lumbar region 01/13/2022   Kyphosis 01/13/2022   Degenerative spondylolisthesis 06/12/2019    PCP: Ricky Stabs NP   REFERRING PROVIDER: Erick Colace, MD  REFERRING DIAG:  M25.551,M25.552,G89.29 (ICD-10-CM) - Chronic hip pain, bilateral M25.561,M25.562,G89.29 (ICD-10-CM) - Chronic pain of both knees M96.1 (ICD-10-CM) - Lumbar post-laminectomy  syndrome  Rationale for Evaluation and Treatment: Rehabilitation  THERAPY DIAG:  Other low back pain  Muscle weakness (generalized)  Chronic pain of right knee  Foot drop, right  ONSET DATE: chronic  SUBJECTIVE:                                                                                                                                                                                           SUBJECTIVE STATEMENT: Pt reports the moist heat was helpful the last visit and she has started using it more at home  EVAL: is also  have a XR on her hip today.  Her pain is mostly when standing, is fairly comfortable in sitting.  Her Rt LE is swollen, numb in her lower leg, laterally.  She has difficulty walking, standing.  She can only wear certain shoes due to swelling.  She is unsure if she had foot drop before her surgery.  She does not wear an AFO for this.  She has had physical therapy before about a year ago.   Endorses stiffness and difficulty rising from a chair.. She stands for < 5 min with pain controlled,  after that pain increases to 9/10.  She is limited in most aspects of functional mobility.    PAIN:  Are you having pain? Yes: NPRS scale: 5/10c extra strength tylenol Pain location: bilateral low back pain to Rt hip and leg  Pain description: sharp, numb in LE  Aggravating factors: activity  Relieving factors: Sitting down , heating pad a little  PERTINENT HISTORY:  MD note:  1.  Lumbar postlaminectomy syndrome status post L2-L4 fusion.  She has decreased range of motion bilateral hips as well she does have mild degenerative changes seen on x-rays from approximately 1 year ago.  Will repeat. In addition she has bilateral knee pain and has had left knee replacement in the past she has a severe deformity right knee.  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 We discussed that repeat x-rays of the knees and hips will be performed. I would like to see her back in 1 month to follow-up  on this. We will do urine drug screen she does have a history of responding favorably to low-dose narcotic analgesics.  Given her history of constipation would favor Butrans patch which started at 7.5 or 5 mcg dose and titrate upward as needed.  She is in favor of this.  She may require orthopedic referral for right knee OA although she may not be comfortable in undergoing surgery at this time.    Lumbar fusion 03/2022 L2-L4 per Dr. Wynn Banker with complication/infection (back surgery x 3 total)  L TKR 2008 Diabetes (new diagnosis)  HTN  PRECAUTIONS: None  RED FLAGS: None   WEIGHT BEARING RESTRICTIONS: No  FALLS:  Has patient fallen in last 6 months? No  LIVING ENVIRONMENT: Lives with: lives with their son Lives in: House/apartment Stairs: Yes: Internal: 13 steps; on right going up Has following equipment at home: Dan Humphreys - 4 wheeled, Wheelchair (manual), Shower bench, and bed side commode  OCCUPATION: Retired , Corporate treasurer.   PLOF: Independent with basic ADLs, Independent with household mobility with device, Independent with community mobility with device, Requires assistive device for independence, and Needs assistance with homemaking  PATIENT GOALS: Patient would like to be able to move with less pain   NEXT MD VISIT:   Objective information taken on eval unless otherwise indicated:   DIAGNOSTIC FINDINGS:  X-rays ordered.  Hip and knee  PATIENT SURVEYS:  LEFS 25/80  SCREENING FOR RED FLAGS: Bowel or bladder incontinence: No Spinal tumors: No Cauda equina syndrome: No Compression fracture: No Abdominal aneurysm: No  COGNITION: Overall cognitive status: Within functional limits for tasks assessed     SENSATION: Right lower extremity can feel cold and deep pressure but decreased sensation compared to L   POSTURE: rounded shoulders, forward head, increased thoracic kyphosis, flexed trunk , and weight shift left  PALPATION: None tender to palpation in right knee and  lumbar spine  LUMBAR ROM:   AROM eval  Flexion NT  Extension  To neutral   Right lateral flexion NT   Left lateral flexion NT   Right rotation 50%   Left rotation 50%   (Blank rows = not tested)  LOWER EXTREMITY ROM:     Active  Right eval Left eval  Hip flexion [facility]  Hip extension Unable Unable  Hip abduction NT eval NT on eval  Hip adduction    Hip internal rotation [facility]   Hip external rotation tight Tight   Knee flexion 85 110  Knee extension -13 5  Ankle dorsiflexion Minimal Full  Ankle plantarflexion    Ankle inversion    Ankle eversion     (Blank rows = not tested)  LOWER EXTREMITY MMT:    MMT Right eval Left eval  Hip flexion 4 - 4  Hip extension    Hip abduction    Hip adduction    Hip internal rotation    Hip external rotation    Knee flexion 2 5  Knee extension 4 - 4+  Ankle dorsiflexion 1 5  Ankle plantarflexion    Ankle inversion    Ankle eversion     (Blank rows = not tested)  LUMBAR SPECIAL TESTS:  NT   FUNCTIONAL TESTS:  5 times sit to stand: 18. 4 sec with hands on thighs to walker   GAIT: Distance walked: 80 Assistive device utilized: Walker - 4 wheeled Level of assistance: Modified independence Comments: slides Rt foot along floor/foot drop. Leans L to assist in Rt LE swing, trunk flexion and cervical rotation, scapular elevation   TODAY'S TREATMENT:  [facility] Adult PT Treatment:                                                DATE: 07/28/23 Therapeutic Exercise: Shoulder hor abd star pattern x5 RTB Shoulder ER x15 RTB Seated shoulder row x15 RTB Seated hamstring stretch  Seated QL stretch Seated cervical rotation Seated trunk flexion forward c rollator assist Seated Pelvic Tilts c ball press x10 5" Seated Long Arc Quad  2x10 5" Sit to Stand to RW 2x5  Hip abd 2x10 RTB  [facility] Adult PT Treatment:                                                DATE: 07/21/23 Therapeutic Exercise: Shoulder hor abd start pattern x5  RTB Shoulder ER x15 RTB Seated shoulder row x15 RTB Seated hamstring stretch  Seated QL stretch Seated cervical rotation Seated trunk flexion forward c rollator assist Seated Pelvic Tilts c ball press x10 5" Seated Long Arc Quad  2x10 5" Sit to Stand to RW 2x5  Hip abd 2x10 RTB Modalities: MH to the low back and upper gluteal region x15 mins  [facility] Adult PT Treatment:                                                DATE: 07/15/23 Therapeutic Exercise: Seated Pelvic Tilts c ball press x10 5" Seated Long Arc Quad  2x10 5" Sit to Stand to RW 2x5  Seated Scapular Retraction  2x10 RTB Hip abd  2x10 RTB Seated trunk flexion forward c rollator assist Updated HEP                                                                                                                       DATE: 07/06/23    PATIENT EDUCATION:  Education details: PT, HEP, posture  Person educated: Patient Education method: Explanation Education comprehension: verbalized understanding and needs further education  HOME EXERCISE PROGRAM: Access Code: [email or web address] URL: [email or web address] Date: 07/21/2023 Prepared by: Joellyn Rued  Exercises - Seated Pelvic Tilts  - 1 x daily - 7 x weekly - 2 sets - 10 reps - 5 hold - Seated Long Arc Quad  - 1 x daily - 7 x weekly - 2 sets - 10 reps - 5 hold - Sit to Stand with Counter Support  - 1 x daily - 7 x weekly - 2 sets - 10 reps - 5 hold - Seated Scapular Retraction  - 1 x daily - 7 x weekly - 2 sets - 10 reps - 5 hold - Standing Shoulder Horizontal Abduction with Resistance  - 1 x daily - 7 x weekly - 2 sets - 10 reps - 3 hold - Seated Hip Abduction  - 1 x daily - 7 x weekly - 2 sets - 10 reps - 3 hold - Standing Cervical Rotation AROM  - 1 x daily - 7 x weekly - 2 sets - 5 reps - 10 hold - Seated Flexion Stretch with Swiss Ball  - 1 x daily - 7 x weekly - 1 sets - 10 reps - 5 hold  ASSESSMENT:  CLINICAL IMPRESSION:  PT was completed for lumbopelvic  flexibility and strengthening, posterior chain strengthening and cervical ROM. Pt demonstrates proper completion of her therex and reports doing so daily. Pt tolerated prescribed therex without adverse effects. Pt is making good progress with her STGs. Pt will continue to benefit from skilled PT to address impairments for improved function. Initiate assessing LTGs the next PT session.    EVAL: Patient is a 72 y.o. female who was seen today for physical therapy evaluation and treatment for Low back, hip and Rt knee pain.  She would benefit from an orthopedic referral for her right knee but she is not sure if is the right time.  She has difficulty standing for any length of time due to pain in her back radiating to her right lower extremity.  We will see her once a week for 8 weeks if she continues to make efforts toward progress.    OBJECTIVE IMPAIRMENTS: Abnormal gait, decreased activity tolerance, decreased balance, decreased coordination, decreased endurance, decreased knowledge of condition, decreased mobility, difficulty walking, decreased ROM, decreased strength, increased edema, increased fascial restrictions, impaired flexibility, impaired sensation, impaired UE functional use, postural dysfunction, obesity, and pain.   ACTIVITY LIMITATIONS: carrying, lifting, sitting, standing, squatting, stairs, transfers, bed mobility, and locomotion level  PARTICIPATION LIMITATIONS: meal prep, cleaning, interpersonal relationship, driving, shopping, and community activity  PERSONAL FACTORS: Time  since onset of injury/illness/exacerbation and 3+ comorbidities: Left total knee replacement, chronic pain ,right leg weakness  are also affecting patient's functional outcome.   REHAB POTENTIAL: Good  CLINICAL DECISION MAKING: Evolving/moderate complexity  EVALUATION COMPLEXITY: Moderate   GOALS: Goals reviewed with patient? Yes  SHORT TERM GOALS: Target date: 08/03/2023   Pt will be I with HEP for core,  posture and mobility in general  Baseline: Given basic seated exercises on evaluation Goal status: MET- Pt does some everyday  2.  Pt will begin to spend more time being upright and more active throughout the day Baseline: Patient is quite sedentary Goal status: Ongoing  3.  Pt will be screened for balance and fall risk, goal set if needed Baseline: Will do next visit Goal status: No assessed, pt uses her rollator at all times   LONG TERM GOALS: Target date: 08/31/2023    Pt will be able to improve LEFS to 35/80- to demo improved functional mobility  Baseline: 25/80 Goal status: INITIAL  2.  Patient will be able to stand for 15 minutes using her walker for home tasks with no more than moderate pain in her lower back and right leg Baseline: Unable to make it beyond 5 minutes most of the time due to pain Goal status: INITIAL  3.  Patient will be able to improve 5 times sit to stand to 15 seconds or less without using upper extremities Baseline: 18 sec with hands  Goal status: INITIAL  4.  Patient will be able to improve posture with ambulation showing improved extension and more symmetrical gait pattern from the waiting area to the gym with minimal cues Baseline: Patient flexed forward, and unable to advance right leg without leaning R  Goal status: INITIAL  5.  Patient will increase Right hamstring strength to 3+/5 Baseline: 2/5  Goal status: INITIAL   PLAN:  PT FREQUENCY: 1x/week  PT DURATION: 8 weeks  PLANNED INTERVENTIONS: Therapeutic exercises, Therapeutic activity, Neuromuscular re-education, Balance training, Gait training, Patient/Family education, Self Care, Stair training, DME instructions, Cryotherapy, Moist heat, Manual therapy, and Re-evaluation.  PLAN FOR NEXT SESSION: Home program, standing as tolerated General lower extremity and core strength. 2 min walk test, balance   Joellyn Rued MS, PT 07/28/23 3:01 PM

## 2023-07-27 NOTE — Telephone Encounter (Signed)
Hayley Padilla ([name]) PA Case ID #: [id number] Need Help? Call us at [phone] Status sent iconSent to Plan today Drug Buprenorphine 10MCG/HR weekly patches

## 2023-07-28 ENCOUNTER — Ambulatory Visit: Payer: No Typology Code available for payment source

## 2023-07-28 DIAGNOSIS — M5459 Other low back pain: Secondary | ICD-10-CM

## 2023-07-28 DIAGNOSIS — G8929 Other chronic pain: Secondary | ICD-10-CM

## 2023-07-28 DIAGNOSIS — M6281 Muscle weakness (generalized): Secondary | ICD-10-CM

## 2023-07-28 DIAGNOSIS — M21371 Foot drop, right foot: Secondary | ICD-10-CM

## 2023-07-28 NOTE — Telephone Encounter (Signed)
Buprenorphine Patch approved 04/29/23--07/27/2024.

## 2023-07-29 ENCOUNTER — Other Ambulatory Visit: Payer: Self-pay | Admitting: Family

## 2023-07-29 NOTE — Telephone Encounter (Signed)
Medication Refill - Medication: lovastatin (MEVACOR) 40 MG tablet, cloNIDine (CATAPRES) 0.1 MG tablet and potassium chloride (K-DUR) 10 MEQ tablet  Has the patient contacted their pharmacy? No.  Preferred Pharmacy (with phone number or street name): [id number] [place], [place] - [address] AT [address]  Phone: [phone] Fax: [phone]  Has the patient been seen for an appointment in the last year OR does the patient have an upcoming appointment? Yes.    Agent: Please be advised that RX refills may take up to 3 business days. We ask that you follow-up with your pharmacy.

## 2023-07-29 NOTE — Telephone Encounter (Signed)
Hayley Padilla ([name]) - [id number] Buprenorphine 10MCG/HR weekly patches Status: PA Response - ApprovedCreated: August 13th, 2024Sent: August 13th, 2024

## 2023-07-30 NOTE — Telephone Encounter (Signed)
Requested medications are due for refill today.  unsure  Requested medications are on the active medications list.  yes  Last refill. varied  Future visit scheduled.   yes  Notes to clinic.  All 3 medications are signed by other providers, and all 63 are over 74 year old. Please review for refill.    Requested Prescriptions  Pending Prescriptions Disp Refills   cloNIDine (CATAPRES) 0.1 MG tablet 60 tablet     Sig: Take 1 tablet (0.1 mg total) by mouth 2 (two) times daily.     Cardiovascular:  Alpha-2 Agonists Passed - 07/29/2023 11:29 AM      Passed - Last BP in normal range    BP Readings from Last 1 Encounters:  07/22/23 130/75         Passed - Last Heart Rate in normal range    Pulse Readings from Last 1 Encounters:  07/22/23 87         Passed - Valid encounter within last 6 months    Recent Outpatient Visits           2 weeks ago Primary hypertension   [place] [facility], [place], [place]   1 month ago Encounter to establish care   [place] [facility], [place] Fick, NP       Future Appointments             In 4 days McDonald, Delana Meyer [place] [facility] [place] [place]   In 2 weeks Rema Fendt, NP [facility]   In 2 weeks Delford Field Charlcie Cradle, MD [facility]   In 2 months Rema Fendt, NP [facility] at [facility]             lovastatin (MEVACOR) 40 MG tablet      Sig: Take 1 tablet (40 mg total) by mouth every evening. Hold while on Daptomycin - can resume once course completed.     Cardiovascular:  Antilipid - Statins 2 Failed - 07/29/2023 11:29 AM      Failed - Lipid Panel in normal range within the last 12 months    Cholesterol, Total  Date Value Ref Range Status  06/23/2023 158 100 - 199 mg/dL Final   LDL Cholesterol (Calc)  Date Value Ref Range Status  03/10/2022 91 mg/dL  (calc) Final    Comment:    Reference range: <100 . Desirable range <100 mg/dL for primary prevention;   <70 mg/dL for patients with CHD or diabetic patients  with > or = 2 CHD risk factors. Marland Kitchen LDL-C is now calculated using the Martin-Hopkins  calculation, which is a validated novel method providing  better accuracy than the Friedewald equation in the  estimation of LDL-C.  Horald Pollen et al. Lenox Ahr. 1610;960(45): 2061-2068  (http://education.QuestDiagnostics.com/faq/FAQ164)    LDL Chol Calc (NIH)  Date Value Ref Range Status  06/23/2023 91 0 - 99 mg/dL Final   HDL  Date Value Ref Range Status  06/23/2023 44 >39 mg/dL Final   Triglycerides  Date Value Ref Range Status  06/23/2023 129 0 - 149 mg/dL Final         Passed - Cr in normal range and within 360 days    Creat  Date Value Ref Range Status  03/10/2022 0.63 0.60 - 1.00 mg/dL Final   Creatinine, Ser  Date Value  Ref Range Status  06/23/2023 0.71 0.57 - 1.00 mg/dL Final         Passed - Patient is not pregnant      Passed - Valid encounter within last 12 months    Recent Outpatient Visits           2 weeks ago Primary hypertension   [place] [facility], [place], [place]   1 month ago Encounter to establish care   [place] [facility], [place], [place]       Future Appointments             In 4 days McDonald, Delana Meyer [place] [facility] [place], [place]   In 2 weeks Rema Fendt, NP [facility] at [facility]   In 2 weeks Delford Field Charlcie Cradle, MD [facility]   In 2 months Rema Fendt, NP [facility] at [facility]             potassium chloride (KLOR-CON M) 10 MEQ tablet      Sig: Take 1 tablet (10 mEq total) by mouth daily.     Endocrinology:  Minerals - Potassium Supplementation Passed - 07/29/2023 11:29 AM      Passed - K in normal range and within  360 days    Potassium  Date Value Ref Range Status  06/23/2023 4.2 3.5 - 5.2 mmol/L Final         Passed - Cr in normal range and within 360 days    Creat  Date Value Ref Range Status  03/10/2022 0.63 0.60 - 1.00 mg/dL Final   Creatinine, Ser  Date Value Ref Range Status  06/23/2023 0.71 0.57 - 1.00 mg/dL Final         Passed - Valid encounter within last 12 months    Recent Outpatient Visits           2 weeks ago Primary hypertension   [place] [facility], [place], [place]   1 month ago Encounter to establish care   [facility], [place], [place]       Future Appointments             In 4 days McDonald, Rachelle Hora, DPM  [facility] [place], [place]   In 2 weeks Rema Fendt, NP [facility] at [facility]   In 2 weeks Delford Field Charlcie Cradle, MD [facility]   In 2 months Rema Fendt, NP [facility] at [facility]

## 2023-08-03 ENCOUNTER — Ambulatory Visit (INDEPENDENT_AMBULATORY_CARE_PROVIDER_SITE_OTHER): Payer: No Typology Code available for payment source | Admitting: Podiatry

## 2023-08-03 ENCOUNTER — Ambulatory Visit (INDEPENDENT_AMBULATORY_CARE_PROVIDER_SITE_OTHER): Payer: No Typology Code available for payment source

## 2023-08-03 DIAGNOSIS — R600 Localized edema: Secondary | ICD-10-CM | POA: Diagnosis not present

## 2023-08-03 DIAGNOSIS — M19072 Primary osteoarthritis, left ankle and foot: Secondary | ICD-10-CM | POA: Diagnosis not present

## 2023-08-03 DIAGNOSIS — M19071 Primary osteoarthritis, right ankle and foot: Secondary | ICD-10-CM

## 2023-08-03 NOTE — Therapy (Unsigned)
OUTPATIENT PHYSICAL THERAPY THORACOLUMBAR TREATMENT NOTE   Patient Name: Hayley Padilla MRN: [id number] DOB:[birth year], 72 y.o., female Today's Date: 08/04/2023  END OF SESSION:  PT End of Session - 08/04/23 1419     Visit Number 5    Number of Visits 8    Date for PT Re-Evaluation 08/31/23    Authorization Type [employer] , [employer]    PT Start Time 1418    PT Stop Time 1500    PT Time Calculation (min) 42 min    Activity Tolerance Patient tolerated treatment well;Patient limited by pain    Behavior During Therapy WFL for tasks assessed/performed                 Past Medical History:  Diagnosis Date   Allergy    Arthritis    Hyperlipidemia    Hypertension    PONV (postoperative nausea and vomiting)    Past Surgical History:  Procedure Laterality Date   BACK SURGERY     lower back   IR LUMBAR DISC ASPIRATION W/IMG GUIDE  01/15/2022   LAMINECTOMY WITH POSTERIOR LATERAL ARTHRODESIS LEVEL 1 N/A 03/16/2022   Procedure: Lumbar One-Two Posterior Lateral Fusion with pedicle screws;  Surgeon: Julio Sicks, MD;  Location: [facility] OR;  Service: Neurosurgery;  Laterality: N/A;   MULTIPLE TOOTH EXTRACTIONS     Patient Active Problem List   Diagnosis Date Noted   Diabetes mellitus, type 2 (HCC) 06/24/2023   Vertebral osteomyelitis (HCC) 03/12/2022   Hypokalemia 03/12/2022   Edema of right lower extremity 01/15/2022   Essential hypertension 01/14/2022   Chronic pain 01/14/2022   HLD (hyperlipidemia) 01/14/2022   Discitis 01/14/2022   Obesity (BMI 30-39.9) 01/13/2022   S/P lumbar spinal fusion 01/13/2022   Discitis of lumbar region 01/13/2022   Kyphosis 01/13/2022   Degenerative spondylolisthesis 06/12/2019    PCP: Ricky Stabs NP   REFERRING PROVIDER: Erick Colace, MD  REFERRING DIAG:  M25.551,M25.552,G89.29 (ICD-10-CM) - Chronic hip pain, bilateral M25.561,M25.562,G89.29 (ICD-10-CM) - Chronic pain of both knees M96.1 (ICD-10-CM) - Lumbar post-laminectomy  syndrome  Rationale for Evaluation and Treatment: Rehabilitation  THERAPY DIAG:  Other low back pain  Muscle weakness (generalized)  Chronic pain of right knee  Foot drop, right  ONSET DATE: chronic  SUBJECTIVE:                                                                                                                                                                                           SUBJECTIVE STATEMENT: I think this is my last day.  I have arthritis in my feet.  I can do this at home.  I wanted to work my legs.  Pt reports financial difficulty.   PAIN:  Are you having pain? Yes: NPRS scale: 5/10c extra strength tylenol Pain location: bilateral low back pain to Rt hip and leg  Pain description: sharp, numb in LE  Aggravating factors: activity  Relieving factors: Sitting down , heating pad a little  PERTINENT HISTORY:  MD note:  1.  Lumbar postlaminectomy syndrome status post L2-L4 fusion.  She has decreased range of motion bilateral hips as well she does have mild degenerative changes seen on x-rays from approximately 1 year ago.  Will repeat. In addition she has bilateral knee pain and has had left knee replacement in the past she has a severe deformity right knee.  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 We discussed that repeat x-rays of the knees and hips will be performed. I would like to see her back in 1 month to follow-up on this. We will do urine drug screen she does have a history of responding favorably to low-dose narcotic analgesics.  Given her history of constipation would favor Butrans patch which started at 7.5 or 5 mcg dose and titrate upward as needed.  She is in favor of this.  She may require orthopedic referral for right knee OA although she may not be comfortable in undergoing surgery at this time.    Lumbar fusion 03/2022 L2-L4 per Dr. Wynn Banker with complication/infection (back surgery x 3 total)  L TKR 2008 Diabetes (new diagnosis)   HTN  PRECAUTIONS: None  RED FLAGS: None   WEIGHT BEARING RESTRICTIONS: No  FALLS:  Has patient fallen in last 6 months? No  LIVING ENVIRONMENT: Lives with: lives with their son Lives in: House/apartment Stairs: Yes: Internal: 13 steps; on right going up Has following equipment at home: Dan Humphreys - 4 wheeled, Wheelchair (manual), Shower bench, and bed side commode  OCCUPATION: Retired , Corporate treasurer.   PLOF: Independent with basic ADLs, Independent with household mobility with device, Independent with community mobility with device, Requires assistive device for independence, and Needs assistance with homemaking  PATIENT GOALS: Patient would like to be able to move with less pain   NEXT MD VISIT:   Objective information taken on eval unless otherwise indicated:   DIAGNOSTIC FINDINGS:  X-rays ordered.  Hip and knee  PATIENT SURVEYS:  LEFS 25/80  SCREENING FOR RED FLAGS: Bowel or bladder incontinence: No Spinal tumors: No Cauda equina syndrome: No Compression fracture: No Abdominal aneurysm: No  COGNITION: Overall cognitive status: Within functional limits for tasks assessed     SENSATION: Right lower extremity can feel cold and deep pressure but decreased sensation compared to L   POSTURE: rounded shoulders, forward head, increased thoracic kyphosis, flexed trunk , and weight shift left  PALPATION: None tender to palpation in right knee and lumbar spine  LUMBAR ROM:   AROM eval  Flexion NT  Extension To neutral   Right lateral flexion NT   Left lateral flexion NT   Right rotation 50%   Left rotation 50%   (Blank rows = not tested)  LOWER EXTREMITY ROM:     Active  Right eval Left eval  Hip flexion [facility]  Hip extension Unable Unable  Hip abduction NT eval NT on eval  Hip adduction    Hip internal rotation [facility]   Hip external rotation tight Tight   Knee flexion 85 110  Knee extension -13 5  Ankle dorsiflexion Minimal Full  Ankle plantarflexion     Ankle inversion  Ankle eversion     (Blank rows = not tested)  LOWER EXTREMITY MMT:    MMT Right eval Left eval  Hip flexion 4 - 4  Hip extension    Hip abduction    Hip adduction    Hip internal rotation    Hip external rotation    Knee flexion 2 5  Knee extension 4 - 4+  Ankle dorsiflexion 1 5  Ankle plantarflexion    Ankle inversion    Ankle eversion     (Blank rows = not tested)  LUMBAR SPECIAL TESTS:  NT   FUNCTIONAL TESTS:  5 times sit to stand: 18. 4 sec with hands on thighs to walker   08/04/23: 25 sec  GAIT: Distance walked: 80 Assistive device utilized: Walker - 4 wheeled Level of assistance: Modified independence Comments: slides Rt foot along floor/foot drop. Leans L to assist in Rt LE swing, trunk flexion and cervical rotation, scapular elevation   TODAY'S TREATMENT:   [facility] Adult PT Treatment:                                                DATE: 08/03/23 Therapeutic Exercise: NuStep UE and LE for 6 min L 5  Standing, posture without UE assist , pain severe 9/10 Mini squats at countertop x 10 , requested to sit   Supine exercises: quad set, SLR each LE  Trunk rotation x 8 Supine ab bracing  x 10 max cues  Horizontal abduction red band x 10  Supine short arcs x 20 each LE   Self Care: POC, benefits of PT for her multi -joint pain Positioning for exercises   [facility] Adult PT Treatment:                                                DATE: 07/28/23 Therapeutic Exercise: Shoulder hor abd star pattern x5 RTB Shoulder ER x15 RTB Seated shoulder row x15 RTB Seated hamstring stretch  Seated QL stretch Seated cervical rotation Seated trunk flexion forward c rollator assist Seated Pelvic Tilts c ball press x10 5" Seated Long Arc Quad  2x10 5" Sit to Stand to RW 2x5  Hip abd 2x10 RTB  [facility] Adult PT Treatment:                                                DATE: 07/21/23 Therapeutic Exercise: Shoulder hor abd start pattern x5 RTB Shoulder ER x15  RTB Seated shoulder row x15 RTB Seated hamstring stretch  Seated QL stretch Seated cervical rotation Seated trunk flexion forward c rollator assist Seated Pelvic Tilts c ball press x10 5" Seated Long Arc Quad  2x10 5" Sit to Stand to RW 2x5  Hip abd 2x10 RTB Modalities: MH to the low back and upper gluteal region x15 mins  [facility] Adult PT Treatment:  DATE: 07/15/23 Therapeutic Exercise: Seated Pelvic Tilts c ball press x10 5" Seated Long Arc Quad  2x10 5" Sit to Stand to RW 2x5  Seated Scapular Retraction  2x10 RTB Hip abd 2x10 RTB Seated trunk flexion forward c rollator assist Updated HEP                                                                                                                       DATE: 07/06/23    PATIENT EDUCATION:  Education details: PT, HEP, posture  Person educated: Patient Education method: Explanation Education comprehension: verbalized understanding and needs further education  HOME EXERCISE PROGRAM: Access Code: [email or web address] URL: [email or web address] Date: 07/21/2023 Prepared by: Joellyn Rued  Exercises - Seated Pelvic Tilts  - 1 x daily - 7 x weekly - 2 sets - 10 reps - 5 hold - Seated Long Arc Quad  - 1 x daily - 7 x weekly - 2 sets - 10 reps - 5 hold - Sit to Stand with Counter Support  - 1 x daily - 7 x weekly - 2 sets - 10 reps - 5 hold - Seated Scapular Retraction  - 1 x daily - 7 x weekly - 2 sets - 10 reps - 5 hold - Standing Shoulder Horizontal Abduction with Resistance  - 1 x daily - 7 x weekly - 2 sets - 10 reps - 3 hold - Seated Hip Abduction  - 1 x daily - 7 x weekly - 2 sets - 10 reps - 3 hold - Standing Cervical Rotation AROM  - 1 x daily - 7 x weekly - 2 sets - 5 reps - 10 hold - Seated Flexion Stretch with Swiss Ball  - 1 x daily - 7 x weekly - 1 sets - 10 reps - 5 hold  ASSESSMENT:  CLINICAL IMPRESSION:   Patient continues to be limited in all aspects of mobility  due to chronic pain in back, hips and feet.  She gives multiple reasons today why she would like today to be her last day in PT.  She was encouraged to continue as her issues will take longer to address than a few weeks.  She has significant weakness in Rt LE and has difficulty tolerating supine or standing.  She has purchased a seated stepper machine, has her HEP as well.  I informed her she could come back if finances change    OBJECTIVE IMPAIRMENTS: Abnormal gait, decreased activity tolerance, decreased balance, decreased coordination, decreased endurance, decreased knowledge of condition, decreased mobility, difficulty walking, decreased ROM, decreased strength, increased edema, increased fascial restrictions, impaired flexibility, impaired sensation, impaired UE functional use, postural dysfunction, obesity, and pain.   ACTIVITY LIMITATIONS: carrying, lifting, sitting, standing, squatting, stairs, transfers, bed mobility, and locomotion level  PARTICIPATION LIMITATIONS: meal prep, cleaning, interpersonal relationship, driving, shopping, and community activity  PERSONAL FACTORS: Time since onset of injury/illness/exacerbation and 3+ comorbidities: Left total knee replacement, chronic pain ,right leg weakness  are also affecting patient's functional outcome.  REHAB POTENTIAL: Good  CLINICAL DECISION MAKING: Evolving/moderate complexity  EVALUATION COMPLEXITY: Moderate   GOALS: Goals reviewed with patient? Yes  SHORT TERM GOALS: Target date: 08/03/2023   Pt will be I with HEP for core, posture and mobility in general  Baseline: Given basic seated exercises on evaluation Goal status: MET- Pt does some everyday  2.  Pt will begin to spend more time being upright and more active throughout the day Baseline: Patient is quite sedentary Goal status: PARTIALLY MET   3.  Pt will be screened for balance and fall risk, goal set if needed Baseline: Will do next visit Goal status: No assessed,  pt uses her rollator at all times   LONG TERM GOALS: Target date: 08/31/2023    Pt will be able to improve LEFS to 35/80- to demo improved functional mobility  Baseline: 25/80 Goal status: NT- no improvement per patient   2.  Patient will be able to stand for 15 minutes using her walker for home tasks with no more than moderate pain in her lower back and right leg Baseline: Unable to make it beyond 5 minutes most of the time due to pain Goal status: not met   3.  Patient will be able to improve 5 times sit to stand to 15 seconds or less without using upper extremities Baseline: 18 sec with hands  Goal status: INITIAL  4.  Patient will be able to improve posture with ambulation showing improved extension and more symmetrical gait pattern from the waiting area to the gym with minimal cues Baseline: Patient flexed forward, and unable to advance right leg without leaning R  Goal status:not met   5.  Patient will increase Right hamstring strength to 3+/5 Baseline: Rt hamstring 2+/5  Goal status: not met    PLAN:  PT FREQUENCY: 1x/week  PT DURATION: 8 weeks  PLANNED INTERVENTIONS: Therapeutic exercises, Therapeutic activity, Neuromuscular re-education, Balance training, Gait training, Patient/Family education, Self Care, Stair training, DME instructions, Cryotherapy, Moist heat, Manual therapy, and Re-evaluation.  PLAN FOR NEXT SESSION: DC, NA   Karie Mainland, PT 08/04/23 2:57 PM Phone: [phone] Fax: [phone]

## 2023-08-03 NOTE — Progress Notes (Signed)
  Subjective:  Patient ID: Hayley Padilla, female    DOB: 1951-11-09,  MRN: [id number]  Chief Complaint  Patient presents with   Joint Swelling    C/o bilateral ankle swelling and tightness x 1 month. Achy pain at bedtime. Patient is currently taking Lasix 40mg . She occasionally wears compression socks.     72 y.o. female presents with the above complaint. History confirmed with patient.   Objective:  Physical Exam: warm, good capillary refill, no trophic changes or ulcerative lesions, normal DP and PT pulses, normal sensory exam, and no varicosities noted, she has mild to moderate nonpitting edema around the bilateral ankle and sinus tarsi, some tenderness in the sinus tarsi to palpation.  Good smooth range of motion without pain..   Radiographs: Multiple views x-ray of both feet: Left worse than right, she has moderate subtalar joint arthritis and ankle arthritis Assessment:   1. Arthritis of both ankles      Plan:  Patient was evaluated and treated and all questions answered.  We reviewed her radiographs together we discussed the presence of the swelling, suspect likely her subtalar arthritis and sinus tarsi syndrome type symptoms are contributing to this.  We discussed supportive shoes, use of topical anti-inflammatories, she says she has had some relief with Biofreeze.  There is no evidence of DVT or severe venous insufficiency.  We did discuss using OTC compression stockings to prevent the edema, elevating the legs at the end of the day to reduce it once it is swollen.  Finally we also discussed corticosteroid injection into the sinus tarsi and subtalar joint to alleviate this as well.  She will let me know if she would like to proceed with this.  Return if symptoms worsen or fail to improve.

## 2023-08-04 ENCOUNTER — Encounter: Payer: Self-pay | Admitting: Physical Therapy

## 2023-08-04 ENCOUNTER — Ambulatory Visit: Payer: No Typology Code available for payment source | Admitting: Physical Therapy

## 2023-08-04 DIAGNOSIS — M5459 Other low back pain: Secondary | ICD-10-CM

## 2023-08-04 DIAGNOSIS — M6281 Muscle weakness (generalized): Secondary | ICD-10-CM

## 2023-08-04 DIAGNOSIS — M21371 Foot drop, right foot: Secondary | ICD-10-CM

## 2023-08-04 DIAGNOSIS — G8929 Other chronic pain: Secondary | ICD-10-CM

## 2023-08-13 ENCOUNTER — Encounter: Payer: Self-pay | Admitting: Family

## 2023-08-13 ENCOUNTER — Other Ambulatory Visit (HOSPITAL_COMMUNITY)
Admission: RE | Admit: 2023-08-13 | Discharge: 2023-08-13 | Disposition: A | Discharge status: Home / Self Care | Payer: No Typology Code available for payment source | Source: Ambulatory Visit | Attending: Family | Admitting: Family

## 2023-08-13 ENCOUNTER — Ambulatory Visit (INDEPENDENT_AMBULATORY_CARE_PROVIDER_SITE_OTHER): Payer: 59 | Admitting: Family

## 2023-08-13 VITALS — BP 125/75 | HR 78 | Temp 98.0°F | Ht 62.0 in | Wt 211.0 lb

## 2023-08-13 DIAGNOSIS — N898 Other specified noninflammatory disorders of vagina: Secondary | ICD-10-CM | POA: Insufficient documentation

## 2023-08-13 DIAGNOSIS — E1165 Type 2 diabetes mellitus with hyperglycemia: Secondary | ICD-10-CM | POA: Diagnosis not present

## 2023-08-13 DIAGNOSIS — Z7984 Long term (current) use of oral hypoglycemic drugs: Secondary | ICD-10-CM

## 2023-08-13 DIAGNOSIS — D509 Iron deficiency anemia, unspecified: Secondary | ICD-10-CM

## 2023-08-13 LAB — POCT URINALYSIS DIP (CLINITEK)
Bilirubin, UA: NEGATIVE
Blood, UA: NEGATIVE
Glucose, UA: NEGATIVE mg/dL
Ketones, POC UA: NEGATIVE mg/dL
Leukocytes, UA: NEGATIVE
Nitrite, UA: NEGATIVE
POC PROTEIN,UA: NEGATIVE
Spec Grav, UA: 1.015
Urobilinogen, UA: 0.2 U/dL
pH, UA: 6

## 2023-08-13 MED ORDER — METFORMIN HCL ER 500 MG PO TB24
500.0000 mg | ORAL_TABLET | Freq: Every day | ORAL | 0 refills | Status: DC
Start: 2023-08-13 — End: 2023-09-24

## 2023-08-13 NOTE — Progress Notes (Signed)
Patient ID: Hayley Padilla, female    DOB: 1951/08/16  MRN: [id number]  CC: Chronic Conditions Follow-Up  Subjective: Hayley Padilla is a 72 y.o. female who presents for chronic conditions follow-up.   Her concerns today include:  - Doing well on Metformin XR, no issues/concerns. She denies red flag symptoms associated with diabetes. - Doing well on Ferrous Sulfate, no issues/concerns.  - Reports since previous office visit established with Podiatry and Ophthalmology.  - Vaginal odor. She denies additional symptoms.   Patient Active Problem List   Diagnosis Date Noted   Diabetes mellitus, type 2 (HCC) 06/24/2023   Vertebral osteomyelitis (HCC) 03/12/2022   Hypokalemia 03/12/2022   Edema of right lower extremity 01/15/2022   Essential hypertension 01/14/2022   Chronic pain 01/14/2022   HLD (hyperlipidemia) 01/14/2022   Discitis 01/14/2022   Obesity (BMI 30-39.9) 01/13/2022   S/P lumbar spinal fusion 01/13/2022   Discitis of lumbar region 01/13/2022   Kyphosis 01/13/2022   Degenerative spondylolisthesis 06/12/2019     Current Outpatient Medications on File Prior to Visit  Medication Sig Dispense Refill   acetaminophen (TYLENOL) 650 MG CR tablet Take 650 mg by mouth every 8 (eight) hours as needed for pain.     amLODipine (NORVASC) 10 MG tablet Take 1 tablet (10 mg total) by mouth daily. 90 tablet 0   buprenorphine (BUTRANS) 10 MCG/HR PTWK Place 1 patch onto the skin once a week. 4 patch 1   cloNIDine (CATAPRES) 0.1 MG tablet Take 0.1 mg by mouth 2 (two) times daily.     diclofenac (VOLTAREN) 75 MG EC tablet Take 1 tablet (75 mg total) by mouth 2 (two) times daily. 30 tablet 2   furosemide (LASIX) 40 MG tablet Take 40 mg by mouth daily.     gabapentin (NEURONTIN) 300 MG capsule Take 300 mg by mouth 3 (three) times daily.     Iron, Ferrous Sulfate, 325 (65 Fe) MG TABS Take 325 mg by mouth daily. 90 tablet 0   losartan (COZAAR) 100 MG tablet Take 1 tablet (100 mg total) by mouth  daily. 90 tablet 0   lovastatin (MEVACOR) 40 MG tablet Take 1 tablet (40 mg total) by mouth every evening. Hold while on Daptomycin - can resume once course completed. (Patient taking differently: Take 40 mg by mouth every evening.) 30 tablet 0   polyethylene glycol (MIRALAX / GLYCOLAX) 17 g packet Take 17 g by mouth daily as needed. 30 each 1   potassium chloride (K-DUR) 10 MEQ tablet Take 10 mEq by mouth daily.     Vitamin D, Ergocalciferol, (DRISDOL) 1.25 MG (50000 UNIT) CAPS capsule Take 50,000 Units by mouth every Monday.     senna-docusate (SENOKOT-S) 8.6-50 MG tablet Take 1 tablet by mouth at bedtime. (Patient not taking: Reported on 08/13/2023)     No current facility-administered medications on file prior to visit.    Allergies  Allergen Reactions   Penicillins Anaphylaxis    Did it involve swelling of the face/tongue/throat, SOB, or low BP? Yes Did it involve sudden or severe rash/hives, skin peeling, or any reaction on the inside of your mouth or nose? No Did you need to seek medical attention at a hospital or doctor's office? Yes When did it last happen?      "Been a While"  If all above answers are "NO", may proceed with cephalosporin use.    Aspirin Other (See Comments)    brusing _ patient requested to be listed  Social History   Socioeconomic History   Marital status: Widowed    Spouse name: Not on file   Number of children: Not on file   Years of education: Not on file   Highest education level: Not on file  Occupational History   Not on file  Tobacco Use   Smoking status: Never   Smokeless tobacco: Current    Types: Chew   Tobacco comments:    last used 03/17/2020 @ 2200  Vaping Use   Vaping status: Never Used  Substance and Sexual Activity   Alcohol use: Not Currently   Drug use: Never   Sexual activity: Not on file  Other Topics Concern   Not on file  Social History Narrative   Not on file   Social Determinants of Health   Financial Resource  Strain: Not on file  Food Insecurity: Not on file  Transportation Needs: Not on file  Physical Activity: Not on file  Stress: Not on file  Social Connections: Not on file  Intimate Partner Violence: Not on file    Family History  Problem Relation Age of Onset   Colon cancer Neg Hx    Colon polyps Neg Hx    Esophageal cancer Neg Hx    Rectal cancer Neg Hx    Stomach cancer Neg Hx     Past Surgical History:  Procedure Laterality Date   BACK SURGERY     lower back   IR LUMBAR DISC ASPIRATION W/IMG GUIDE  01/15/2022   LAMINECTOMY WITH POSTERIOR LATERAL ARTHRODESIS LEVEL 1 N/A 03/16/2022   Procedure: Lumbar One-Two Posterior Lateral Fusion with pedicle screws;  Surgeon: Julio Sicks, MD;  Location: [facility] OR;  Service: Neurosurgery;  Laterality: N/A;   MULTIPLE TOOTH EXTRACTIONS      ROS: Review of Systems Negative except as stated above  PHYSICAL EXAM: BP 125/75   Pulse 78   Temp 98 F (36.7 C) (Oral)   Ht 5\' 2"  (1.575 m)   Wt 211 lb (95.7 kg)   SpO2 97%   BMI 38.59 kg/m   Physical Exam HENT:     Head: Normocephalic and atraumatic.     Nose: Nose normal.     Mouth/Throat:     Mouth: Mucous membranes are moist.     Pharynx: Oropharynx is clear.  Eyes:     Extraocular Movements: Extraocular movements intact.     Conjunctiva/sclera: Conjunctivae normal.     Pupils: Pupils are equal, round, and reactive to light.  Cardiovascular:     Rate and Rhythm: Normal rate and regular rhythm.     Pulses: Normal pulses.     Heart sounds: Normal heart sounds.  Pulmonary:     Effort: Pulmonary effort is normal.     Breath sounds: Normal breath sounds.  Musculoskeletal:        General: Normal range of motion.     Cervical back: Normal range of motion and neck supple.  Neurological:     General: No focal deficit present.     Mental Status: She is alert and oriented to person, place, and time.  Psychiatric:        Mood and Affect: Mood normal.        Behavior: Behavior normal.      ASSESSMENT AND PLAN: 1. Type 2 diabetes mellitus with hyperglycemia, without long-term current use of insulin (HCC) - Hemoglobin A1c 6.5% on 06/23/2023 - Continue Metformin XR as prescribed. - Routine screening.  - Discussed the importance of healthy eating  habits, low-carbohydrate diet, low-sugar diet, regular aerobic exercise (at least 150 minutes a week as tolerated) and medication compliance to achieve or maintain control of diabetes. - Follow-up with primary provider in 6 weeks or sooner if needed. - metFORMIN (GLUCOPHAGE-XR) 500 MG 24 hr tablet; Take 1 tablet (500 mg total) by mouth daily with breakfast.  Dispense: 90 tablet; Refill: 0 - Microalbumin / creatinine urine ratio  2. Iron deficiency anemia, unspecified iron deficiency anemia type - Continue Ferrous Sulfate as prescribed. No refills needed as of present.  - Routine screening.  - Follow-up with primary provider as scheduled.  - CBC  3. Vaginal odor - Routine screening.  - POCT URINALYSIS DIP (CLINITEK); Future - Cervicovaginal ancillary only    Patient was given the opportunity to ask questions.  Patient verbalized understanding of the plan and was able to repeat key elements of the plan. Patient was given clear instructions to go to Emergency Department or return to medical center if symptoms don't improve, worsen, or new problems develop.The patient verbalized understanding.   Orders Placed This Encounter  Procedures   CBC   Microalbumin / creatinine urine ratio   POCT URINALYSIS DIP (CLINITEK)     Requested Prescriptions   Signed Prescriptions Disp Refills   metFORMIN (GLUCOPHAGE-XR) 500 MG 24 hr tablet 90 tablet 0    Sig: Take 1 tablet (500 mg total) by mouth daily with breakfast.    Return in about 6 weeks (around 09/24/2023) for Follow-Up or next available chronic conditions .  Rema Fendt, NP

## 2023-08-14 LAB — CBC
Hematocrit: 32.4 % — ABNORMAL LOW (ref 34.0–46.6)
Hemoglobin: 9.9 g/dL — ABNORMAL LOW (ref 11.1–15.9)
MCH: 22.8 pg — ABNORMAL LOW (ref 26.6–33.0)
MCHC: 30.6 g/dL — ABNORMAL LOW (ref 31.5–35.7)
MCV: 75 fL — ABNORMAL LOW (ref 79–97)
Platelets: 324 10*3/uL (ref 150–450)
RBC: 4.34 x10E6/uL (ref 3.77–5.28)
RDW: 15.7 % — ABNORMAL HIGH (ref 11.7–15.4)
WBC: 4.9 10*3/uL (ref 3.4–10.8)

## 2023-08-15 LAB — MICROALBUMIN / CREATININE URINE RATIO
Creatinine, Urine: 85 mg/dL
Microalb/Creat Ratio: 9 mg/g{creat} (ref 0–29)
Microalbumin, Urine: 7.9 ug/mL

## 2023-08-17 ENCOUNTER — Other Ambulatory Visit: Payer: Self-pay | Admitting: Family

## 2023-08-17 DIAGNOSIS — D649 Anemia, unspecified: Secondary | ICD-10-CM

## 2023-08-17 MED ORDER — IRON (FERROUS SULFATE) 325 (65 FE) MG PO TABS
325.0000 mg | ORAL_TABLET | Freq: Every day | ORAL | 0 refills | Status: DC
Start: 2023-08-17 — End: 2023-09-24

## 2023-08-19 ENCOUNTER — Ambulatory Visit: Payer: 59 | Admitting: Critical Care Medicine

## 2023-08-19 ENCOUNTER — Other Ambulatory Visit: Payer: Self-pay | Admitting: Family

## 2023-08-19 DIAGNOSIS — N898 Other specified noninflammatory disorders of vagina: Secondary | ICD-10-CM

## 2023-08-19 LAB — CERVICOVAGINAL ANCILLARY ONLY
Comment: NEGATIVE
Comment: NEGATIVE
Comment: NEGATIVE
Comment: NEGATIVE
Comment: NEGATIVE
Comment: NORMAL

## 2023-08-20 ENCOUNTER — Encounter: Payer: No Typology Code available for payment source | Attending: Registered Nurse | Admitting: Registered Nurse

## 2023-08-20 ENCOUNTER — Encounter: Payer: Self-pay | Admitting: Registered Nurse

## 2023-08-20 ENCOUNTER — Telehealth: Payer: Self-pay

## 2023-08-20 VITALS — BP 133/73 | HR 78 | Ht 62.0 in | Wt 211.4 lb

## 2023-08-20 DIAGNOSIS — M25552 Pain in left hip: Secondary | ICD-10-CM | POA: Diagnosis not present

## 2023-08-20 DIAGNOSIS — G894 Chronic pain syndrome: Secondary | ICD-10-CM | POA: Insufficient documentation

## 2023-08-20 DIAGNOSIS — G8929 Other chronic pain: Secondary | ICD-10-CM | POA: Diagnosis present

## 2023-08-20 DIAGNOSIS — M25562 Pain in left knee: Secondary | ICD-10-CM | POA: Diagnosis not present

## 2023-08-20 DIAGNOSIS — Z5181 Encounter for therapeutic drug level monitoring: Secondary | ICD-10-CM | POA: Insufficient documentation

## 2023-08-20 DIAGNOSIS — Z79891 Long term (current) use of opiate analgesic: Secondary | ICD-10-CM | POA: Diagnosis not present

## 2023-08-20 DIAGNOSIS — M961 Postlaminectomy syndrome, not elsewhere classified: Secondary | ICD-10-CM | POA: Diagnosis not present

## 2023-08-20 DIAGNOSIS — M25551 Pain in right hip: Secondary | ICD-10-CM | POA: Diagnosis not present

## 2023-08-20 DIAGNOSIS — M25561 Pain in right knee: Secondary | ICD-10-CM | POA: Insufficient documentation

## 2023-08-20 MED ORDER — BUPRENORPHINE 15 MCG/HR TD PTWK: 1.0000 | MEDICATED_PATCH | TRANSDERMAL | 2 refills | Status: DC

## 2023-08-20 MED ORDER — BUPRENORPHINE 15 MCG/HR TD PTWK: 1.0000 | MEDICATED_PATCH | TRANSDERMAL | 1 refills | Status: DC

## 2023-08-20 NOTE — Telephone Encounter (Signed)
Called patient pharmacy to advise butran 15 mcg patch needs to be filled

## 2023-08-20 NOTE — Progress Notes (Signed)
Subjective:    Patient ID: Hayley Padilla, female    DOB: August 19, 1951, 72 y.o.   MRN: [id number]  HPI: Hayley Padilla is a 72 y.o. female who returns for follow up appointment for chronic pain and medication refill. She states her  pain is located in lower back, Buttock Pain  and bilateral hip pain R>L. She  rates  her pain 10. [name] states she misunderstood the instruction for Butrans patch and was applying the patch every 5 days, when she called the pharmacy, they let her know it was every 7 days.  She  also states she wasn't receiving any relief of pain with her current medication regimen. She also reports her pain has increased in intensity with activity such as walking and standing. Dr Wynn Banker note was reviewed and Hayley Padilla was increased, instructions given to [name], she is aware if this occurs again a written warning letter will be given and this can lead to being discharge from our office, she verbalizes understanding.   Her current exercise regime is walking  with walker and performing stretching exercises.   UDS ordered today.    Pain Inventory Average Pain 8 Pain Right Now 10 My pain is dull  In the last 24 hours, has pain interfered with the following? General activity 5 Relation with others 5 Enjoyment of life 0 What TIME of day is your pain at its worst? daytime Sleep (in general) Good  Pain is worse with: walking, standing, and some activites Pain improves with: rest Relief from Meds:  hard to say  Family History  Problem Relation Age of Onset   Colon cancer Neg Hx    Colon polyps Neg Hx    Esophageal cancer Neg Hx    Rectal cancer Neg Hx    Stomach cancer Neg Hx    Social History   Socioeconomic History   Marital status: Widowed    Spouse name: Not on file   Number of children: Not on file   Years of education: Not on file   Highest education level: Not on file  Occupational History   Not on file  Tobacco Use   Smoking status: Never   Smokeless  tobacco: Current    Types: Chew   Tobacco comments:    last used 03/17/2020 @ 2200  Vaping Use   Vaping status: Never Used  Substance and Sexual Activity   Alcohol use: Not Currently   Drug use: Never   Sexual activity: Not on file  Other Topics Concern   Not on file  Social History Narrative   Not on file   Social Determinants of Health   Financial Resource Strain: Not on file  Food Insecurity: Not on file  Transportation Needs: Not on file  Physical Activity: Not on file  Stress: Not on file  Social Connections: Not on file   Past Surgical History:  Procedure Laterality Date   BACK SURGERY     lower back   IR LUMBAR DISC ASPIRATION W/IMG GUIDE  01/15/2022   LAMINECTOMY WITH POSTERIOR LATERAL ARTHRODESIS LEVEL 1 N/A 03/16/2022   Procedure: Lumbar One-Two Posterior Lateral Fusion with pedicle screws;  Surgeon: Julio Sicks, MD;  Location: [facility] OR;  Service: Neurosurgery;  Laterality: N/A;   MULTIPLE TOOTH EXTRACTIONS     Past Surgical History:  Procedure Laterality Date   BACK SURGERY     lower back   IR LUMBAR DISC ASPIRATION W/IMG GUIDE  01/15/2022   LAMINECTOMY WITH POSTERIOR LATERAL ARTHRODESIS  LEVEL 1 N/A 03/16/2022   Procedure: Lumbar One-Two Posterior Lateral Fusion with pedicle screws;  Surgeon: Julio Sicks, MD;  Location: [facility] OR;  Service: Neurosurgery;  Laterality: N/A;   MULTIPLE TOOTH EXTRACTIONS     Past Medical History:  Diagnosis Date   Allergy    Arthritis    Hyperlipidemia    Hypertension    PONV (postoperative nausea and vomiting)    BP 133/73   Pulse 78   Ht 5\' 2"  (1.575 m)   Wt 211 lb 6.4 oz (95.9 kg)   SpO2 99%   BMI 38.67 kg/m   Opioid Risk Score:   Fall Risk Score:  `1  Depression screen PHQ 2/9     08/20/2023    2:51 PM 07/22/2023    2:14 PM 07/14/2023    1:51 PM 02/25/2022    9:04 AM 02/17/2022    2:15 PM 01/13/2022    2:35 PM  Depression screen PHQ 2/9  Decreased Interest 0 0 3 0 0 0  Down, Depressed, Hopeless 0 0 3 0 0 0  PHQ - 2 Score 0 0  6 0 0 0  Altered sleeping   0     Tired, decreased energy   3     Change in appetite   2     Feeling bad or failure about yourself    0     Trouble concentrating   0     Moving slowly or fidgety/restless   0     Suicidal thoughts   0     PHQ-9 Score   11     Difficult doing work/chores   Somewhat difficult         Review of Systems  Musculoskeletal:  Positive for back pain and gait problem.       Right knee pain   All other systems reviewed and are negative.     Objective:   Physical Exam Vitals and nursing note reviewed.  Constitutional:      Appearance: Normal appearance. She is obese.  Cardiovascular:     Rate and Rhythm: Normal rate and regular rhythm.     Pulses: Normal pulses.     Heart sounds: Normal heart sounds.  Pulmonary:     Effort: Pulmonary effort is normal.     Breath sounds: Normal breath sounds.  Musculoskeletal:     Cervical back: Normal range of motion and neck supple.     Comments: Normal Muscle Bulk and Muscle Testing Reveals:  Upper Extremities: Full ROM and Muscle Strength 5/5 Sacral Pain   Lower Extremities: Full ROM and Muscle Strength 5/5 Arises from Chair slowly using walker for support Antalgic  Gait     Skin:    General: Skin is warm and dry.  Neurological:     Mental Status: She is alert and oriented to person, place, and time.  Psychiatric:        Mood and Affect: Mood normal.        Behavior: Behavior normal.          Assessment & Plan:  Lumbar Post Laminectomy Syndrome: Ms. [name] underwent on 03/17/2023 : Dr Jordan Likes : Lumbar One-Two Posterior Lateral Fusion with pedicle screws  Continue HEP as Tolerated. Continue to Monitor.  2. Chronic Bilateral Hip Pain: Continue HEP as Tolerated. Continue to Monitor.  3. Chronic Pain of Bilateral Knees: Continue HEP as Tolerated. Continue to Monitor.  4. Chronic Pain Syndrome: Increased: Butrans 15 mcg  one patch weekly #4. We will  continue the opioid monitoring program, this consists of  regular clinic visits, examinations, urine drug screen, pill counts as well as use of [place] Controlled Substance Reporting system. A 12 month History has been reviewed on the [place] Controlled Substance Reporting System on 09/09/2023.  F/U in 1 month

## 2023-08-24 ENCOUNTER — Other Ambulatory Visit: Payer: No Typology Code available for payment source

## 2023-08-24 ENCOUNTER — Other Ambulatory Visit: Payer: Self-pay

## 2023-08-24 ENCOUNTER — Other Ambulatory Visit (HOSPITAL_COMMUNITY)
Admission: RE | Admit: 2023-08-24 | Discharge: 2023-08-24 | Disposition: A | Discharge status: Home / Self Care | Payer: No Typology Code available for payment source | Source: Ambulatory Visit | Attending: Family | Admitting: Family

## 2023-08-24 DIAGNOSIS — N898 Other specified noninflammatory disorders of vagina: Secondary | ICD-10-CM

## 2023-08-24 NOTE — Progress Notes (Signed)
Patient came in for Aptima swab redo

## 2023-08-25 ENCOUNTER — Other Ambulatory Visit: Payer: Self-pay | Admitting: Family

## 2023-08-25 DIAGNOSIS — B3731 Acute candidiasis of vulva and vagina: Secondary | ICD-10-CM

## 2023-08-25 DIAGNOSIS — B9689 Other specified bacterial agents as the cause of diseases classified elsewhere: Secondary | ICD-10-CM

## 2023-08-25 LAB — CERVICOVAGINAL ANCILLARY ONLY
Bacterial Vaginitis (gardnerella): POSITIVE — AB
Candida Glabrata: POSITIVE — AB
Candida Vaginitis: POSITIVE — AB
Chlamydia: NEGATIVE
Comment: NEGATIVE
Comment: NEGATIVE
Comment: NEGATIVE
Comment: NEGATIVE
Comment: NEGATIVE
Comment: NORMAL
Neisseria Gonorrhea: NEGATIVE
Trichomonas: NEGATIVE

## 2023-08-25 MED ORDER — FLUCONAZOLE 150 MG PO TABS
150.0000 mg | ORAL_TABLET | ORAL | 0 refills | Status: AC
Start: 2023-08-25 — End: 2023-09-01

## 2023-08-25 MED ORDER — METRONIDAZOLE 500 MG PO TABS
500.0000 mg | ORAL_TABLET | Freq: Two times a day (BID) | ORAL | 0 refills | Status: AC
Start: 2023-08-25 — End: 2023-09-01

## 2023-08-26 LAB — TOXASSURE SELECT,+ANTIDEPR,UR

## 2023-08-30 ENCOUNTER — Ambulatory Visit: Payer: No Typology Code available for payment source | Admitting: Podiatry

## 2023-08-30 ENCOUNTER — Other Ambulatory Visit: Payer: Self-pay | Admitting: Family

## 2023-08-30 NOTE — Telephone Encounter (Signed)
Medication Refill - Medication: gabapentin (NEURONTIN) 300 MG capsule   Has the patient contacted their pharmacy? Yes.   Pt told to contact pcp  Preferred Pharmacy (with phone number or street name):  [id number] [place] Otto, [place] - [address] AT [facility] Phone: [phone]  Fax: [phone]     Has the patient been seen for an appointment in the last year OR does the patient have an upcoming appointment? Yes.    Agent: Please be advised that RX refills may take up to 3 business days. We ask that you follow-up with your pharmacy.

## 2023-08-31 NOTE — Telephone Encounter (Signed)
Requested medication (s) are due for refill today: yes  Requested medication (s) are on the active medication list: yes  Last refill:  08/13/22  Future visit scheduled: yes  Notes to clinic:  Unable to refill per protocol, last refill by historical provider.      Requested Prescriptions  Pending Prescriptions Disp Refills   gabapentin (NEURONTIN) 300 MG capsule      Sig: Take 1 capsule (300 mg total) by mouth 3 (three) times daily.     Neurology: Anticonvulsants - gabapentin Passed - 08/30/2023 11:21 AM      Passed - Cr in normal range and within 360 days    Creat  Date Value Ref Range Status  03/10/2022 0.63 0.60 - 1.00 mg/dL Final   Creatinine, Ser  Date Value Ref Range Status  06/23/2023 0.71 0.57 - 1.00 mg/dL Final         Passed - Completed PHQ-2 or PHQ-9 in the last 360 days      Passed - Valid encounter within last 12 months    Recent Outpatient Visits           2 weeks ago Type 2 diabetes mellitus with hyperglycemia, without long-term current use of insulin (HCC)   [place] [facility], [place]   1 month ago Primary hypertension   [place] [facility], [place], [place]   2 months ago Encounter to establish care   [facility] at [facility], [place] Fick, NP       Future Appointments             In 3 weeks Rema Fendt, NP [facility] at [facility]   In 1 month Rema Fendt, NP [facility] at [facility]

## 2023-09-02 MED ORDER — GABAPENTIN 300 MG PO CAPS: 300.0000 mg | ORAL_CAPSULE | Freq: Three times a day (TID) | ORAL | 0 refills | Status: DC

## 2023-09-06 DIAGNOSIS — H3561 Retinal hemorrhage, right eye: Secondary | ICD-10-CM | POA: Diagnosis not present

## 2023-09-06 DIAGNOSIS — H43391 Other vitreous opacities, right eye: Secondary | ICD-10-CM | POA: Diagnosis not present

## 2023-09-06 DIAGNOSIS — H25813 Combined forms of age-related cataract, bilateral: Secondary | ICD-10-CM | POA: Diagnosis not present

## 2023-09-15 ENCOUNTER — Encounter: Payer: No Typology Code available for payment source | Attending: Registered Nurse | Admitting: Registered Nurse

## 2023-09-15 DIAGNOSIS — M25552 Pain in left hip: Secondary | ICD-10-CM | POA: Insufficient documentation

## 2023-09-15 DIAGNOSIS — G894 Chronic pain syndrome: Secondary | ICD-10-CM | POA: Insufficient documentation

## 2023-09-15 DIAGNOSIS — G8929 Other chronic pain: Secondary | ICD-10-CM | POA: Insufficient documentation

## 2023-09-15 DIAGNOSIS — M961 Postlaminectomy syndrome, not elsewhere classified: Secondary | ICD-10-CM | POA: Insufficient documentation

## 2023-09-15 DIAGNOSIS — M25562 Pain in left knee: Secondary | ICD-10-CM | POA: Insufficient documentation

## 2023-09-15 DIAGNOSIS — Z79891 Long term (current) use of opiate analgesic: Secondary | ICD-10-CM | POA: Insufficient documentation

## 2023-09-15 DIAGNOSIS — M25551 Pain in right hip: Secondary | ICD-10-CM | POA: Insufficient documentation

## 2023-09-15 DIAGNOSIS — Z5181 Encounter for therapeutic drug level monitoring: Secondary | ICD-10-CM | POA: Insufficient documentation

## 2023-09-15 DIAGNOSIS — M25561 Pain in right knee: Secondary | ICD-10-CM | POA: Insufficient documentation

## 2023-09-15 NOTE — Progress Notes (Deleted)
Subjective:    Patient ID: Hayley Padilla, female    DOB: 1951-08-13, 72 y.o.   MRN: [id number]  HPI   Pain Inventory Average Pain {NUMBERS; 0-10:5044} Pain Right Now {NUMBERS; 0-10:5044} My pain is {PAIN DESCRIPTION:21022940}  In the last 24 hours, has pain interfered with the following? General activity {NUMBERS; 0-10:5044} Relation with others {NUMBERS; 0-10:5044} Enjoyment of life {NUMBERS; 0-10:5044} What TIME of day is your pain at its worst? {time of day:24191} Sleep (in general) {BHH GOOD/FAIR/POOR:22877}  Pain is worse with: {ACTIVITIES:21022942} Pain improves with: {PAIN IMPROVES KGMW:10272536} Relief from Meds: {NUMBERS; 0-10:5044}  Family History  Problem Relation Age of Onset   Colon cancer Neg Hx    Colon polyps Neg Hx    Esophageal cancer Neg Hx    Rectal cancer Neg Hx    Stomach cancer Neg Hx    Social History   Socioeconomic History   Marital status: Widowed    Spouse name: Not on file   Number of children: Not on file   Years of education: Not on file   Highest education level: Not on file  Occupational History   Not on file  Tobacco Use   Smoking status: Never   Smokeless tobacco: Current    Types: Chew   Tobacco comments:    last used 03/17/2020 @ 2200  Vaping Use   Vaping status: Never Used  Substance and Sexual Activity   Alcohol use: Not Currently   Drug use: Never   Sexual activity: Not on file  Other Topics Concern   Not on file  Social History Narrative   Not on file   Social Determinants of Health   Financial Resource Strain: Not on file  Food Insecurity: Not on file  Transportation Needs: Not on file  Physical Activity: Not on file  Stress: Not on file  Social Connections: Not on file   Past Surgical History:  Procedure Laterality Date   BACK SURGERY     lower back   IR LUMBAR DISC ASPIRATION W/IMG GUIDE  01/15/2022   LAMINECTOMY WITH POSTERIOR LATERAL ARTHRODESIS LEVEL 1 N/A 03/16/2022   Procedure: Lumbar One-Two  Posterior Lateral Fusion with pedicle screws;  Surgeon: Julio Sicks, MD;  Location: [facility] OR;  Service: Neurosurgery;  Laterality: N/A;   MULTIPLE TOOTH EXTRACTIONS     Past Surgical History:  Procedure Laterality Date   BACK SURGERY     lower back   IR LUMBAR DISC ASPIRATION W/IMG GUIDE  01/15/2022   LAMINECTOMY WITH POSTERIOR LATERAL ARTHRODESIS LEVEL 1 N/A 03/16/2022   Procedure: Lumbar One-Two Posterior Lateral Fusion with pedicle screws;  Surgeon: Julio Sicks, MD;  Location: [facility] OR;  Service: Neurosurgery;  Laterality: N/A;   MULTIPLE TOOTH EXTRACTIONS     Past Medical History:  Diagnosis Date   Allergy    Arthritis    Hyperlipidemia    Hypertension    PONV (postoperative nausea and vomiting)    There were no vitals taken for this visit.  Opioid Risk Score:   Fall Risk Score:  `1  Depression screen PHQ 2/9     08/20/2023    2:51 PM 07/22/2023    2:14 PM 07/14/2023    1:51 PM 02/25/2022    9:04 AM 02/17/2022    2:15 PM 01/13/2022    2:35 PM  Depression screen PHQ 2/9  Decreased Interest 0 0 3 0 0 0  Down, Depressed, Hopeless 0 0 3 0 0 0  PHQ - 2 Score 0 0 6  0 0 0  Altered sleeping   0     Tired, decreased energy   3     Change in appetite   2     Feeling bad or failure about yourself    0     Trouble concentrating   0     Moving slowly or fidgety/restless   0     Suicidal thoughts   0     PHQ-9 Score   11     Difficult doing work/chores   Somewhat difficult       Review of Systems     Objective:   Physical Exam        Assessment & Plan:

## 2023-09-20 ENCOUNTER — Other Ambulatory Visit: Payer: Self-pay | Admitting: Family

## 2023-09-20 DIAGNOSIS — G8929 Other chronic pain: Secondary | ICD-10-CM

## 2023-09-20 DIAGNOSIS — D649 Anemia, unspecified: Secondary | ICD-10-CM

## 2023-09-20 NOTE — Telephone Encounter (Signed)
Medication Refill - Medication:  Cholecalciferol (VITAMIN D) 125 MCG (5000 UT) CAPS  diclofenac (VOLTAREN) 75 MG EC tablet  Iron, Ferrous Sulfate, 325 (65 Fe) MG TABS   Has the patient contacted their pharmacy? No. (Agent: If no, request that the patient contact the pharmacy for the refill. If patient does not wish to contact the pharmacy document the reason why and proceed with request.) (Agent: If yes, when and what did the pharmacy advise?)  Preferred Pharmacy (with phone number or street name):  [facility] #[id number] [place] Otto, [place] - [address] AT [facility] Phone: [phone]  Fax: [phone]     Has the patient been seen for an appointment in the last year OR does the patient have an upcoming appointment? Yes.    Agent: Please be advised that RX refills may take up to 3 business days. We ask that you follow-up with your pharmacy.

## 2023-09-21 ENCOUNTER — Other Ambulatory Visit: Payer: Self-pay | Admitting: Family

## 2023-09-21 DIAGNOSIS — Z1231 Encounter for screening mammogram for malignant neoplasm of breast: Secondary | ICD-10-CM

## 2023-09-21 NOTE — Telephone Encounter (Signed)
Requested medication (s) are due for refill today: Yes  Requested medication (s) are on the active medication list: Yes  Last refill:    Future visit scheduled: Yes  Notes to clinic:  Vit D - historical provider  Diclofenac- manual review  Ferrous sulfate    Requested Prescriptions  Pending Prescriptions Disp Refills   Cholecalciferol (VITAMIN D) 125 MCG (5000 UT) CAPS 30 capsule      Endocrinology:  Vitamins - Vitamin D Supplementation 2 Failed - 09/20/2023 11:36 AM      Failed - Manual Review: Route requests for 50,000 IU strength to the provider      Failed - Vitamin D in normal range and within 360 days    Vit D, 25-Hydroxy  Date Value Ref Range Status  03/10/2022 77 30 - 100 ng/mL Final    Comment:    Vitamin D Status         25-OH Vitamin D: . Deficiency:                    <20 ng/mL Insufficiency:             20 - 29 ng/mL Optimal:                 > or = 30 ng/mL . For 25-OH Vitamin D testing on patients on  D2-supplementation and patients for whom quantitation  of D2 and D3 fractions is required, the [employer]ureD(TM) 25-OH VIT D, (D2,D3), LC/MS/MS is recommended: order  code 32440 (patients >82yrs). See Note 1 . Note 1 . For additional information, please refer to  http://education.Quest[employer].com/faq/FAQ199  (This link is being provided for informational/ educational purposes only.)          Passed - Ca in normal range and within 360 days    Calcium  Date Value Ref Range Status  06/23/2023 9.1 8.7 - 10.3 mg/dL Final         Passed - Valid encounter within last 12 months    Recent Outpatient Visits           1 month ago Type 2 diabetes mellitus with hyperglycemia, without long-term current use of insulin (HCC)   [place] [facility], [place], [place]   2 months ago Primary hypertension   [place] [facility], [place], [place]   3 months ago Encounter to establish care   [facility] at  [facility], [place], [place]       Future Appointments             In 3 days Rema Fendt, NP [facility] at [facility]   In 3 weeks Rema Fendt, NP [facility] at [facility]             diclofenac (VOLTAREN) 75 MG EC tablet 30 tablet 2    Sig: Take 1 tablet (75 mg total) by mouth 2 (two) times daily.     Analgesics:  NSAIDS Failed - 09/20/2023 11:36 AM      Failed - Manual Review: Labs are only required if the patient has taken medication for more than 8 weeks.      Failed - HGB in normal range and within 360 days    Hemoglobin  Date Value Ref Range Status  08/13/2023 9.9 (L) 11.1 - 15.9 g/dL Final         Failed - HCT in normal  range and within 360 days    Hematocrit  Date Value Ref Range Status  08/13/2023 32.4 (L) 34.0 - 46.6 % Final         Passed - Cr in normal range and within 360 days    Creat  Date Value Ref Range Status  03/10/2022 0.63 0.60 - 1.00 mg/dL Final   Creatinine, Ser  Date Value Ref Range Status  06/23/2023 0.71 0.57 - 1.00 mg/dL Final         Passed - PLT in normal range and within 360 days    Platelets  Date Value Ref Range Status  08/13/2023 324 150 - 450 x10E3/uL Final         Passed - eGFR is 30 or above and within 360 days    GFR, Est African American  Date Value Ref Range Status  01/21/2021 102 > OR = 60 mL/min/1.8m2 Final   GFR, Est Non African American  Date Value Ref Range Status  01/21/2021 88 > OR = 60 mL/min/1.81m2 Final   GFR, Estimated  Date Value Ref Range Status  03/16/2022 >60 >60 mL/min Final    Comment:    (NOTE) Calculated using the CKD-EPI Creatinine Equation (2021)    eGFR  Date Value Ref Range Status  06/23/2023 90 >59 mL/min/1.73 Final         Passed - Patient is not pregnant      Passed - Valid encounter within last 12 months    Recent Outpatient Visits           1 month ago Type 2 diabetes mellitus with hyperglycemia, without long-term current  use of insulin (HCC)   [place] [facility], [place], [place]   2 months ago Primary hypertension   [place] [facility] at [facility], [place], [place]   3 months ago Encounter to establish care   [facility] at [facility], [place] Fick, NP       Future Appointments             In 3 days Rema Fendt, NP [facility] at [facility]   In 3 weeks Rema Fendt, NP [facility] at [facility]             Iron, Ferrous Sulfate, 325 (65 Fe) MG TABS 90 tablet 0    Sig: Take 325 mg by mouth daily.     Endocrinology:  Minerals - Iron Supplementation Failed - 09/20/2023 11:36 AM      Failed - HGB in normal range and within 360 days    Hemoglobin  Date Value Ref Range Status  08/13/2023 9.9 (L) 11.1 - 15.9 g/dL Final         Failed - HCT in normal range and within 360 days    Hematocrit  Date Value Ref Range Status  08/13/2023 32.4 (L) 34.0 - 46.6 % Final         Failed - Fe (serum) in normal range and within 360 days    Iron  Date Value Ref Range Status  01/15/2022 47 28 - 170 ug/dL Final   Saturation Ratios  Date Value Ref Range Status  01/15/2022 14 10.4 - 31.8 % Final         Failed - Ferritin in normal range and within 360 days    Ferritin  Date Value Ref Range Status  01/15/2022 19 11 - 307 ng/mL Final  Comment:    Performed at [facility], [address], [place], [place] [address]         Passed - RBC in normal range and within 360 days    RBC  Date Value Ref Range Status  08/13/2023 4.34 3.77 - 5.28 x10E6/uL Final  03/16/2022 4.45 3.87 - 5.11 MIL/uL Final         Passed - Valid encounter within last 12 months    Recent Outpatient Visits           1 month ago Type 2 diabetes mellitus with hyperglycemia, without long-term current use of insulin (HCC)   [place] [facility] at [facility], [place]   2 months ago Primary hypertension   [facility], [place], [place]   3 months ago Encounter to establish care   [facility] at [facility], [place] Fick, NP       Future Appointments             In 3 days Rema Fendt, NP [facility] at [facility]   In 3 weeks Rema Fendt, NP [facility] Care at [facility]

## 2023-09-24 ENCOUNTER — Ambulatory Visit (INDEPENDENT_AMBULATORY_CARE_PROVIDER_SITE_OTHER): Payer: No Typology Code available for payment source | Admitting: Family

## 2023-09-24 ENCOUNTER — Encounter: Payer: Self-pay | Admitting: Family

## 2023-09-24 VITALS — BP 123/81 | HR 72 | Temp 98.3°F | Ht 62.0 in | Wt 209.2 lb

## 2023-09-24 DIAGNOSIS — G8929 Other chronic pain: Secondary | ICD-10-CM

## 2023-09-24 DIAGNOSIS — D649 Anemia, unspecified: Secondary | ICD-10-CM | POA: Diagnosis not present

## 2023-09-24 DIAGNOSIS — Z7984 Long term (current) use of oral hypoglycemic drugs: Secondary | ICD-10-CM | POA: Diagnosis not present

## 2023-09-24 DIAGNOSIS — M25561 Pain in right knee: Secondary | ICD-10-CM | POA: Diagnosis not present

## 2023-09-24 DIAGNOSIS — K5900 Constipation, unspecified: Secondary | ICD-10-CM | POA: Diagnosis not present

## 2023-09-24 DIAGNOSIS — E1165 Type 2 diabetes mellitus with hyperglycemia: Secondary | ICD-10-CM

## 2023-09-24 DIAGNOSIS — I10 Essential (primary) hypertension: Secondary | ICD-10-CM | POA: Diagnosis not present

## 2023-09-24 LAB — POCT GLYCOSYLATED HEMOGLOBIN (HGB A1C): HbA1c, POC (controlled diabetic range): 6.1 % (ref 0.0–7.0)

## 2023-09-24 MED ORDER — IRON (FERROUS SULFATE) 325 (65 FE) MG PO TABS: 325.0000 mg | ORAL_TABLET | Freq: Every day | ORAL | 0 refills | Status: DC

## 2023-09-24 MED ORDER — POLYETHYLENE GLYCOL 3350 17 G PO PACK: 17.0000 g | PACK | Freq: Every day | ORAL | 0 refills | Status: AC | PRN

## 2023-09-24 MED ORDER — IRON (FERROUS SULFATE) 325 (65 FE) MG PO TABS
325.0000 mg | ORAL_TABLET | Freq: Every day | ORAL | 0 refills | Status: DC
Start: 2023-09-24 — End: 2024-09-21

## 2023-09-24 MED ORDER — LOSARTAN POTASSIUM 100 MG PO TABS: 100.0000 mg | ORAL_TABLET | Freq: Every day | ORAL | 0 refills | Status: DC

## 2023-09-24 MED ORDER — AMLODIPINE BESYLATE 10 MG PO TABS
10.0000 mg | ORAL_TABLET | Freq: Every day | ORAL | 0 refills | Status: DC
Start: 2023-09-24 — End: 2023-12-17

## 2023-09-24 MED ORDER — METFORMIN HCL ER 500 MG PO TB24: 500.0000 mg | ORAL_TABLET | Freq: Every day | ORAL | 0 refills | Status: DC

## 2023-09-24 MED ORDER — DICLOFENAC SODIUM 75 MG PO TBEC: 75.0000 mg | DELAYED_RELEASE_TABLET | Freq: Two times a day (BID) | ORAL | 0 refills | Status: AC

## 2023-09-24 NOTE — Progress Notes (Signed)
Patient needs diclofenac but allergy allert popped up to aspirin, she states she's been taking this medication with no issues.  Patient states no other concernas to discuss.   Declined Flu vaccine.

## 2023-09-24 NOTE — Progress Notes (Signed)
Patient ID: Hayley Padilla, female    DOB: 1951/08/21  MRN: [id number]  CC: Chronic Conditions Follow-Up  Subjective: Hayley Padilla is a 72 y.o. female who presents for chronic conditions follow-up.  Her concerns today include:  - Doing well on Amlodipine and Losartan, no issues/concerns. She does not complain of red flag symptoms such as but not limited to chest pain, shortness of breath, worst headache of life, nausea/vomiting.  - Doing well on Metformin XR, no issues/concerns. She denies red flag symptoms associated with diabetes.  - Doing well on Ferrous Sulfate. Sometimes causing constipation otherwise no issues/concerns.  - Doing well on Miralax, no issues/concerns.   Patient Active Problem List   Diagnosis Date Noted   Diabetes mellitus, type 2 (HCC) 06/24/2023   Vertebral osteomyelitis (HCC) 03/12/2022   Hypokalemia 03/12/2022   Edema of right lower extremity 01/15/2022   Essential hypertension 01/14/2022   Chronic pain 01/14/2022   HLD (hyperlipidemia) 01/14/2022   Discitis 01/14/2022   Obesity (BMI 30-39.9) 01/13/2022   S/P lumbar spinal fusion 01/13/2022   Discitis of lumbar region 01/13/2022   Kyphosis 01/13/2022   Degenerative spondylolisthesis 06/12/2019     Current Outpatient Medications on File Prior to Visit  Medication Sig Dispense Refill   acetaminophen (TYLENOL) 650 MG CR tablet Take 650 mg by mouth every 8 (eight) hours as needed for pain.     buprenorphine (BUTRANS) 15 MCG/HR Place 1 patch onto the skin once a week. 4 patch 1   Cholecalciferol (VITAMIN D) 125 MCG (5000 UT) CAPS      cloNIDine (CATAPRES) 0.1 MG tablet Take 0.1 mg by mouth 2 (two) times daily.     furosemide (LASIX) 40 MG tablet Take 40 mg by mouth daily.     gabapentin (NEURONTIN) 300 MG capsule Take 1 capsule (300 mg total) by mouth 3 (three) times daily. 90 capsule 0   lovastatin (MEVACOR) 40 MG tablet Take 1 tablet (40 mg total) by mouth every evening. Hold while on Daptomycin - can  resume once course completed. (Patient taking differently: Take 40 mg by mouth every evening.) 30 tablet 0   oxyCODONE-acetaminophen (PERCOCET) 7.5-325 MG tablet Take 1 tablet by mouth every 6 (six) hours as needed.     potassium chloride (KLOR-CON) 10 MEQ tablet Take 10 mEq by mouth daily.     senna-docusate (SENOKOT-S) 8.6-50 MG tablet Take 1 tablet by mouth at bedtime.     Vitamin D, Ergocalciferol, (DRISDOL) 1.25 MG (50000 UNIT) CAPS capsule Take 50,000 Units by mouth every Monday.     No current facility-administered medications on file prior to visit.    Allergies  Allergen Reactions   Penicillins Anaphylaxis    Did it involve swelling of the face/tongue/throat, SOB, or low BP? Yes Did it involve sudden or severe rash/hives, skin peeling, or any reaction on the inside of your mouth or nose? No Did you need to seek medical attention at a hospital or doctor's office? Yes When did it last happen?      "Been a While"  If all above answers are "NO", may proceed with cephalosporin use.    Aspirin Other (See Comments)    brusing _ patient requested to be listed    Social History   Socioeconomic History   Marital status: Widowed    Spouse name: Not on file   Number of children: Not on file   Years of education: Not on file   Highest education level: Not on file  Occupational  History   Not on file  Tobacco Use   Smoking status: Never   Smokeless tobacco: Current    Types: Chew   Tobacco comments:    last used 03/17/2020 @ 2200  Vaping Use   Vaping status: Never Used  Substance and Sexual Activity   Alcohol use: Not Currently   Drug use: Never   Sexual activity: Not on file  Other Topics Concern   Not on file  Social History Narrative   Not on file   Social Determinants of Health   Financial Resource Strain: Not on file  Food Insecurity: Not on file  Transportation Needs: Not on file  Physical Activity: Not on file  Stress: Not on file  Social Connections: Not on  file  Intimate Partner Violence: Not on file    Family History  Problem Relation Age of Onset   Colon cancer Neg Hx    Colon polyps Neg Hx    Esophageal cancer Neg Hx    Rectal cancer Neg Hx    Stomach cancer Neg Hx     Past Surgical History:  Procedure Laterality Date   BACK SURGERY     lower back   IR LUMBAR DISC ASPIRATION W/IMG GUIDE  01/15/2022   LAMINECTOMY WITH POSTERIOR LATERAL ARTHRODESIS LEVEL 1 N/A 03/16/2022   Procedure: Lumbar One-Two Posterior Lateral Fusion with pedicle screws;  Surgeon: Julio Sicks, MD;  Location: [facility];  Service: Neurosurgery;  Laterality: N/A;   MULTIPLE TOOTH EXTRACTIONS      ROS: Review of Systems Negative except as stated above  PHYSICAL EXAM: BP 123/81   Pulse 72   Temp 98.3 F (36.8 C) (Oral)   Ht 5\' 2"  (1.575 m)   Wt 209 lb 3.2 oz (94.9 kg)   SpO2 98%   BMI 38.26 kg/m   Physical Exam HENT:     Head: Normocephalic and atraumatic.     Nose: Nose normal.     Mouth/Throat:     Mouth: Mucous membranes are moist.     Pharynx: Oropharynx is clear.  Eyes:     Extraocular Movements: Extraocular movements intact.     Conjunctiva/sclera: Conjunctivae normal.     Pupils: Pupils are equal, round, and reactive to light.  Cardiovascular:     Rate and Rhythm: Normal rate and regular rhythm.     Pulses: Normal pulses.     Heart sounds: Normal heart sounds.  Pulmonary:     Effort: Pulmonary effort is normal.     Breath sounds: Normal breath sounds.  Musculoskeletal:        General: Normal range of motion.     Cervical back: Normal range of motion and neck supple.  Neurological:     General: No focal deficit present.     Mental Status: She is alert and oriented to person, place, and time.  Psychiatric:        Mood and Affect: Mood normal.        Behavior: Behavior normal.    Results for orders placed or performed in visit on 09/24/23  POCT glycosylated hemoglobin (Hb A1C)  Result Value Ref Range   Hemoglobin A1C     HbA1c POC  (<> result, manual entry)     HbA1c, POC (prediabetic range)     HbA1c, POC (controlled diabetic range) 6.1 0.0 - 7.0 %    ASSESSMENT AND PLAN: 1. Primary hypertension - Continue Amlodipine and Losartan as prescribed.  - Counseled on blood pressure goal of less than 140/90,  low-sodium, DASH diet, medication compliance, 150 minutes of moderate intensity exercise per week as tolerated. Discussed medication compliance, adverse effects. - Follow-up with primary provider in 3 months or sooner if needed. - amLODipine (NORVASC) 10 MG tablet; Take 1 tablet (10 mg total) by mouth daily.  Dispense: 90 tablet; Refill: 0 - losartan (COZAAR) 100 MG tablet; Take 1 tablet (100 mg total) by mouth daily.  Dispense: 90 tablet; Refill: 0  2. Type 2 diabetes mellitus with hyperglycemia, without long-term current use of insulin (HCC) - Hemoglobin A1c at goal at 6.1%, goal 8%.  - Continue Metformin XR as prescribed. Counseled on medication adherence/adverse effects. - Discussed the importance of healthy eating habits, low-carbohydrate diet, low-sugar diet, regular aerobic exercise (at least 150 minutes a week as tolerated) and medication compliance to achieve or maintain control of diabetes. - Follow-up with primary provider in 3 months or sooner if needed. - POCT glycosylated hemoglobin (Hb A1C) - metFORMIN (GLUCOPHAGE-XR) 500 MG 24 hr tablet; Take 1 tablet (500 mg total) by mouth daily with breakfast.  Dispense: 90 tablet; Refill: 0  3. Anemia, unspecified type - Continue Ferrous Sulfate as prescribed. Counseled on medication adherence/adverse effects.  - Follow-up with primary provider in 3 months or sooner if needed.  - Iron, Ferrous Sulfate, 325 (65 Fe) MG TABS; Take 325 mg by mouth daily.  Dispense: 90 tablet; Refill: 0  4. Constipation, unspecified constipation type - Continue Polyethylene Glycol as prescribed. Counseled on medication adherence/adverse effects.  - Follow-up with primary provider as  scheduled.  - polyethylene glycol (MIRALAX / GLYCOLAX) 17 g packet; Take 17 g by mouth daily as needed.  Dispense: 90 each; Refill: 0  5. Chronic pain of right knee - Continue Diclofenac as prescribed. Counseled on medication adherence/adverse effects.  - Follow-up with primary provider in 3 months or sooner if needed.  - diclofenac (VOLTAREN) 75 MG EC tablet; Take 1 tablet (75 mg total) by mouth 2 (two) times daily.  Dispense: 180 tablet; Refill: 0   Patient was given the opportunity to ask questions.  Patient verbalized understanding of the plan and was able to repeat key elements of the plan. Patient was given clear instructions to go to Emergency Department or return to medical center if symptoms don't improve, worsen, or new problems develop.The patient verbalized understanding.   Orders Placed This Encounter  Procedures   POCT glycosylated hemoglobin (Hb A1C)     Requested Prescriptions   Signed Prescriptions Disp Refills   metFORMIN (GLUCOPHAGE-XR) 500 MG 24 hr tablet 90 tablet 0    Sig: Take 1 tablet (500 mg total) by mouth daily with breakfast.   Iron, Ferrous Sulfate, 325 (65 Fe) MG TABS 90 tablet 0    Sig: Take 325 mg by mouth daily.   amLODipine (NORVASC) 10 MG tablet 90 tablet 0    Sig: Take 1 tablet (10 mg total) by mouth daily.   losartan (COZAAR) 100 MG tablet 90 tablet 0    Sig: Take 1 tablet (100 mg total) by mouth daily.   diclofenac (VOLTAREN) 75 MG EC tablet 180 tablet 0    Sig: Take 1 tablet (75 mg total) by mouth 2 (two) times daily.   polyethylene glycol (MIRALAX / GLYCOLAX) 17 g packet 90 each 0    Sig: Take 17 g by mouth daily as needed.    Return in about 3 months (around 12/25/2023) for Follow-Up or next available chronic conditions.  Rema Fendt, NP

## 2023-09-28 NOTE — Telephone Encounter (Signed)
Clonidine, Lovastatin, and Potassium Chloride appear as historical medications. Please schedule appointment.

## 2023-10-13 ENCOUNTER — Ambulatory Visit
Admission: RE | Admit: 2023-10-13 | Discharge: 2023-10-13 | Disposition: A | Discharge status: Home / Self Care | Payer: No Typology Code available for payment source | Source: Ambulatory Visit | Attending: Family | Admitting: Family

## 2023-10-13 DIAGNOSIS — Z1231 Encounter for screening mammogram for malignant neoplasm of breast: Secondary | ICD-10-CM

## 2023-10-14 ENCOUNTER — Other Ambulatory Visit: Payer: Self-pay | Admitting: Family

## 2023-10-14 MED ORDER — GABAPENTIN 300 MG PO CAPS: 300.0000 mg | ORAL_CAPSULE | Freq: Three times a day (TID) | ORAL | 0 refills | Status: DC

## 2023-10-14 NOTE — Telephone Encounter (Signed)
Medication Refill - Medication: gabapentin (NEURONTIN) 300 MG capsule   Pt wants a 90 day supply so she does not have to call every month  Has the patient contacted their pharmacy? Yes.   (Agent: If no, request that the patient contact the pharmacy for the refill. If patient does not wish to contact the pharmacy document the reason why and proceed with request.) (Agent: If yes, when and what did the pharmacy advise?)  Preferred Pharmacy (with phone number or street name): [id number] [place], [place] - [address] [place] [address] Phone: [phone]  Fax: [phone]  Has the patient been seen for an appointment in the last year OR does the patient have an upcoming appointment? Yes.    Agent: Please be advised that RX refills may take up to 3 business days. We ask that you follow-up with your pharmacy.

## 2023-10-14 NOTE — Telephone Encounter (Signed)
Requested Prescriptions  Pending Prescriptions Disp Refills   gabapentin (NEURONTIN) 300 MG capsule 270 capsule 0    Sig: Take 1 capsule (300 mg total) by mouth 3 (three) times daily.     Neurology: Anticonvulsants - gabapentin Passed - 10/14/2023 10:44 AM      Passed - Cr in normal range and within 360 days    Creat  Date Value Ref Range Status  03/10/2022 0.63 0.60 - 1.00 mg/dL Final   Creatinine, Ser  Date Value Ref Range Status  06/23/2023 0.71 0.57 - 1.00 mg/dL Final         Passed - Completed PHQ-2 or PHQ-9 in the last 360 days      Passed - Valid encounter within last 12 months    Recent Outpatient Visits           2 weeks ago Primary hypertension   [facility], Amy J, NP   2 months ago Type 2 diabetes mellitus with hyperglycemia, without long-term current use of insulin [facility])   [facility], [place], NP   3 months ago Primary hypertension   [facility], Amy J, NP   3 months ago Encounter to establish care   [facility], Salomon Fick, NP       Future Appointments             In 2 months Rema Fendt, NP [facility]

## 2023-10-15 ENCOUNTER — Ambulatory Visit: Payer: No Typology Code available for payment source | Admitting: Family

## 2023-10-16 ENCOUNTER — Other Ambulatory Visit: Payer: Self-pay | Admitting: Registered Nurse

## 2023-10-18 ENCOUNTER — Encounter: Payer: No Typology Code available for payment source | Attending: Family | Admitting: Dietician

## 2023-10-18 ENCOUNTER — Encounter: Payer: Self-pay | Admitting: Dietician

## 2023-10-18 VITALS — Ht 62.0 in | Wt 209.0 lb

## 2023-10-18 DIAGNOSIS — Z713 Dietary counseling and surveillance: Secondary | ICD-10-CM | POA: Diagnosis not present

## 2023-10-18 DIAGNOSIS — E1165 Type 2 diabetes mellitus with hyperglycemia: Secondary | ICD-10-CM | POA: Diagnosis not present

## 2023-10-18 DIAGNOSIS — Z7984 Long term (current) use of oral hypoglycemic drugs: Secondary | ICD-10-CM | POA: Diagnosis not present

## 2023-10-18 NOTE — Progress Notes (Signed)
Medical Nutrition Therapy  Appointment Start time:  93 (patient late)  Appointment End time:  1525  Primary concerns today: education for newly diagnosed T2D  Referral diagnosis: Type 2 Diabetes (newly diagnosed) Preferred learning style: no preference indicated Learning readiness: ready, change in progress   NUTRITION ASSESSMENT   Clinical Medical Hx: Type 2 Diabetes, HTN, HLD Medications: Metformin XR, Lasix Labs: A1C 6.5% 06/23/2023 (patient states that most recent A1C was 6.1%) Notable Signs/Symptoms:   Lifestyle & Dietary Hx Patient lives with her son.  He does most of the shopping and she cooks.   Supplements: iron, vitamin D, potassium Sleep: good Stress / self-care: good Current average weekly physical activity: recent back surgery, uses a peddler and PT exercises for 50 minutes daily  24-Hr Dietary Recall First Meal: grapes Snack: none Second Meal: none Snack: none Third Meal: baked chicken (1 party wing), rice, green beans Snack: butterfinger (mini) Beverages: water, OJ, regular Pepsi, occasional sweet tea  NUTRITION DIAGNOSIS  NB-1.1 Food and nutrition-related knowledge deficit As related to balance of carbohydrate, protein, and fat.  As evidenced by diet hx and patient report.   NUTRITION INTERVENTION  Nutrition education (E-1) on the following topics:  Counseling and diabetes education initiated.  Discussed My Royetta Crochet, the 3 main macronutrients and implications on blood glucose Discussed label reading Discussed benefits of increased activity and tips for any barriers Discussed basic physiology of Diabetes Instructed on blood glucose target, BG ranges pre and post meals, and A1C goals  Handouts Provided Include  How to Thrive:  A Guide for Your Journey with Diabetes by the ADA Meal Plan Card Label reading Diabetes Resources  Learning Style & Readiness for Change Teaching method utilized: Visual & Auditory  Demonstrated degree of understanding via:  Teach Back  Barriers to learning/adherence to lifestyle change: none  Goals Established by Pt Eat more Non-starchy vegetables Minimize added sugars - rethink your drinks Choose whole grains rather than refined grains most often  Plan:  Aim for 2-3 Carb Choices per meal (30-45 grams) +/- 1 either way  Aim for 0-2 Carbs per snack if hungry  Include protein in moderation with your meals and snacks Consider reading food labels for Total Carbohydrate of foods Consider  increasing your activity level by peddling and PT exercise for >30 minutes daily as tolerated Consider taking medication as directed by MD   MONITORING & EVALUATION Dietary intake, weekly physical activity prn  Next Steps  Patient is to call for questions.

## 2023-10-18 NOTE — Telephone Encounter (Signed)
Refill request from [facility] for Butrans.  Ms/ Hayley Padilla doesn't have a scheduled appointment with our office. Call place to Ms. [name], no answer left message to schedule an appointment.

## 2023-10-18 NOTE — Patient Instructions (Addendum)
Eat more Non-Starchy Vegetables These include greens, broccoli, cauliflower, cabbage, carrots, beets, eggplant, peppers, squash and others. Minimize added sugars and refined grains Rethink what you drink.  Choose beverages without added sugar.  Look for 0 carbs on the label. See the list of whole grains below.  Find alternatives to usual sweet treats. Choose whole foods over processed. Make simple meals at home more often than eating out.  Tips to increase fiber in your diet: (All plants have fiber.  Eat a variety. There are more than are on this list.) Slowly increase the amount of fiber you eat to 25-35 grams per day.  (More is fine if you tolerate it.) Fiber from whole grains, nuts and seeds Quinoa, 1/2 cup = 5 grams Bulgur, 1/2 cup = 4.1 grams Popcorn, 3 cups = 3.6 grams Whole Wheat Spaghetti, 1/2 cup = 3.2 grams Barley, 1/2 cup = 3 grams Oatmeal, 1/2 cup = 2 grams Whole Wheat English Muffin = 3 grams Corn, 1/2 cup = 2.1 grams Brown Rice, 1/2 cup = 1.8 grams Flax seeds, 1 Tablespoon = 2.8 grams Chia seeds, 1 Tablespoon = 11 grams Almonds, 1 ounce = 3.5 grams fiber Fiber from legumes Kidney beans, 1/2 cup 7.9 grams Lentils, 1/2 cup = 7.8 grams Pinto beans, 1/2 cup = 7.7 grams Black beans, 1/2 cup = 7.6 grams Lima beans, 1/2 cup 6.4 grams Chick peas, 1/2 cup = 5.3 grams Black eyed peas, 1/2 cup = 4 grams Fiber from fruits and vegetables Pear, 6 grams Apple. 3.3 grams Raspberries or Blackberries, 3/4 cup = 6 grams Strawberries or Blueberries, 1 cup = 3.4 grams Baked sweet potato 3.8 grams fiber Baked potato with skin 4.4 grams  Peas, 1/2 cup = 4.4 grams  Spinach, 1/2 cup cooked = 3.5 grams  Avocado, 1/2 = 5 grams   Plan:  Aim for 2-3 Carb Choices per meal (30-45 grams) +/- 1 either way  Aim for 0-2 Carbs per snack if hungry  Include protein in moderation with your meals and snacks Consider reading food labels for Total Carbohydrate of foods Consider  increasing your  activity level by peddling and PT exercise for >30 minutes daily as tolerated Consider taking medication as directed by MD

## 2023-10-20 ENCOUNTER — Encounter: Payer: Self-pay | Admitting: Registered Nurse

## 2023-10-20 ENCOUNTER — Encounter: Payer: No Typology Code available for payment source | Attending: Registered Nurse | Admitting: Registered Nurse

## 2023-10-20 VITALS — BP 109/71 | HR 78 | Ht 62.0 in | Wt 212.0 lb

## 2023-10-20 DIAGNOSIS — M1711 Unilateral primary osteoarthritis, right knee: Secondary | ICD-10-CM | POA: Diagnosis not present

## 2023-10-20 DIAGNOSIS — M4310 Spondylolisthesis, site unspecified: Secondary | ICD-10-CM | POA: Insufficient documentation

## 2023-10-20 DIAGNOSIS — M1712 Unilateral primary osteoarthritis, left knee: Secondary | ICD-10-CM | POA: Diagnosis not present

## 2023-10-20 DIAGNOSIS — Z79891 Long term (current) use of opiate analgesic: Secondary | ICD-10-CM | POA: Insufficient documentation

## 2023-10-20 DIAGNOSIS — G894 Chronic pain syndrome: Secondary | ICD-10-CM | POA: Diagnosis not present

## 2023-10-20 DIAGNOSIS — Z5181 Encounter for therapeutic drug level monitoring: Secondary | ICD-10-CM | POA: Insufficient documentation

## 2023-10-20 MED ORDER — BUPRENORPHINE 20 MCG/HR TD PTWK: 1.0000 | MEDICATED_PATCH | TRANSDERMAL | 2 refills | Status: DC

## 2023-10-20 NOTE — Progress Notes (Signed)
Subjective:    Patient ID: Hayley Padilla, female    DOB: March 07, 1951, 72 y.o.   MRN: [id number]  HPI: Hayley Padilla is a 72 y.o. female who returns for follow up appointment for chronic pain and medication refill. She states her pain is located in her buttocks with activity and right knee pain. She reports no relief with her current medication  regimen with Butrans. Dr Wynn Banker August note was reviewed and Butrans was increased to 20 MCG,  Hayley Padilla will call or send a My- Chart message in three weeks with update on medication change, she verbalizes understanding. She aso reports right knee pain, she refuses injection at this time, she will call office if she changes her mind. She  rates her pain 10. Her current exercise regime is walking with her walker short distances    Pain Inventory Average Pain 8 Pain Right Now 10 My pain is constant  In the last 24 hours, has pain interfered with the following? General activity 9 Relation with others 9 Enjoyment of life 9 What TIME of day is your pain at its worst? evening Sleep (in general) Good  Pain is worse with: walking, bending, standing, and some activites Pain improves with: medication Relief from Meds: 5  Family History  Problem Relation Age of Onset   Colon cancer Neg Hx    Colon polyps Neg Hx    Esophageal cancer Neg Hx    Rectal cancer Neg Hx    Stomach cancer Neg Hx    Social History   Socioeconomic History   Marital status: Widowed    Spouse name: Not on file   Number of children: Not on file   Years of education: Not on file   Highest education level: Not on file  Occupational History   Not on file  Tobacco Use   Smoking status: Never   Smokeless tobacco: Current    Types: Chew   Tobacco comments:    last used 03/17/2020 @ 2200  Vaping Use   Vaping status: Never Used  Substance and Sexual Activity   Alcohol use: Not Currently   Drug use: Never   Sexual activity: Not on file  Other Topics Concern   Not on  file  Social History Narrative   Not on file   Social Determinants of Health   Financial Resource Strain: Not on file  Food Insecurity: Not on file  Transportation Needs: Not on file  Physical Activity: Not on file  Stress: Not on file  Social Connections: Not on file   Past Surgical History:  Procedure Laterality Date   BACK SURGERY     lower back   IR LUMBAR DISC ASPIRATION W/IMG GUIDE  01/15/2022   LAMINECTOMY WITH POSTERIOR LATERAL ARTHRODESIS LEVEL 1 N/A 03/16/2022   Procedure: Lumbar One-Two Posterior Lateral Fusion with pedicle screws;  Surgeon: Julio Sicks, MD;  Location: [facility] OR;  Service: Neurosurgery;  Laterality: N/A;   MULTIPLE TOOTH EXTRACTIONS     Past Surgical History:  Procedure Laterality Date   BACK SURGERY     lower back   IR LUMBAR DISC ASPIRATION W/IMG GUIDE  01/15/2022   LAMINECTOMY WITH POSTERIOR LATERAL ARTHRODESIS LEVEL 1 N/A 03/16/2022   Procedure: Lumbar One-Two Posterior Lateral Fusion with pedicle screws;  Surgeon: Julio Sicks, MD;  Location: [facility];  Service: Neurosurgery;  Laterality: N/A;   MULTIPLE TOOTH EXTRACTIONS     Past Medical History:  Diagnosis Date   Allergy    Arthritis  Diabetes mellitus without complication (HCC)    Hyperlipidemia    Hypertension    PONV (postoperative nausea and vomiting)    Ht 5\' 2"  (1.575 m)   Wt 212 lb (96.2 kg)   BMI 38.78 kg/m   Opioid Risk Score:   Fall Risk Score:  `1  Depression screen PHQ 2/9     10/20/2023    1:07 PM 10/18/2023    2:58 PM 09/24/2023    1:44 PM 08/20/2023    2:51 PM 07/22/2023    2:14 PM 07/14/2023    1:51 PM 02/25/2022    9:04 AM  Depression screen PHQ 2/9  Decreased Interest 0 0 0 0 0 3 0  Down, Depressed, Hopeless 0  0 0 0 3 0  PHQ - 2 Score 0 0 0 0 0 6 0  Altered sleeping      0   Tired, decreased energy      3   Change in appetite      2   Feeling bad or failure about yourself       0   Trouble concentrating      0   Moving slowly or fidgety/restless      0   Suicidal  thoughts      0   PHQ-9 Score      11   Difficult doing work/chores      Somewhat difficult       Review of Systems  Musculoskeletal:  Positive for back pain and gait problem.  All other systems reviewed and are negative.     Objective:   Physical Exam Vitals and nursing note reviewed.  Constitutional:      Appearance: Normal appearance. She is obese.  Cardiovascular:     Rate and Rhythm: Normal rate and regular rhythm.     Pulses: Normal pulses.     Heart sounds: Normal heart sounds.  Pulmonary:     Effort: Pulmonary effort is normal.     Breath sounds: Normal breath sounds.  Musculoskeletal:     Right lower leg: Edema present.     Left lower leg: Edema present.     Comments: Normal Muscle Bulk and Muscle Testing Reveals:  Upper Extremities: Decreased ROM 90 Degrees  and Muscle Strength 5/5 Lower Extremities: Right Lower Extremity: Decreased ROM and Muscle Strength 5/5 Right Lower Extremity Flexion Produces Pain into her Right Patella Left Lower Extremity: Full ROM and Muscle Strength 5/5 Arises from Chair slowly using walker for support Antalgic  Gait     Skin:    General: Skin is warm and dry.  Neurological:     Mental Status: She is alert and oriented to person, place, and time.  Psychiatric:        Mood and Affect: Mood normal.        Behavior: Behavior normal.         Assessment & Plan:  Lumbar Post Laminectomy Syndrome: Hayley Padilla underwent on 03/17/2023 : Dr Jordan Likes : Lumbar One-Two Posterior Lateral Fusion with pedicle screws  Continue HEP as Tolerated. Continue to Monitor. 10/20/2023 2. Chronic Bilateral Hip Pain: No complaints [place]. Continue HEP as Tolerated. Continue to Monitor. 10/20/2023 3. Chronic Pain of Bilateral Knees: R>LContinue HEP as Tolerated. Continue to Monitor. 10/20/2023 4. Chronic Pain Syndrome: Increased: Butrans 20 mcg  one patch weekly #4. We will continue the opioid monitoring program, this consists of regular clinic visits,  examinations, urine drug screen, pill counts as well as use of [address]  Controlled Substance Reporting system. A 12 month History has been reviewed on the [place] Controlled Substance Reporting System on 10/20/2023.   F/U in 3 months

## 2023-10-20 NOTE — Patient Instructions (Signed)
Your Butrans dosage was increased this month, call or send a  My-Chart message in 3 weeks , to discuss  medication change.     My- Chart - [phone]

## 2023-11-04 ENCOUNTER — Telehealth: Payer: Self-pay | Admitting: Registered Nurse

## 2023-11-04 NOTE — Telephone Encounter (Signed)
Return Hayley Padilla  call,

## 2023-11-04 NOTE — Telephone Encounter (Signed)
Patches aren't helping, would like to know what else she can do.  Please call patient.

## 2023-11-04 NOTE — Telephone Encounter (Signed)
PMP was Reviewed. In the past she was prescribed Oxycodone with good relief of her pain.  Return Hayley Padilla call, she reports no relief with Butrans. She was scheduled in December with Hayley Padilla, she verbalizes understanding.

## 2023-11-15 ENCOUNTER — Other Ambulatory Visit: Payer: Self-pay | Admitting: Family

## 2023-11-15 NOTE — Telephone Encounter (Signed)
Medication Refill -  Most Recent Primary Care Visit:  Provider: Ricky Stabs J  Department: [facility]  Visit Type: OFFICE VISIT  Date: 09/24/2023  Medication: cloNIDine (CATAPRES) 0.1 MG tablet , potassium chloride (KLOR-CON) 10 MEQ tablet   Has the patient contacted their pharmacy? No   Is this the correct pharmacy for this prescription? Yes If no, delete pharmacy and type the correct one.  This is the patient's preferred pharmacy:  [facility] [address] [place] - [address] [place] [address] Phone: [phone] Fax: [phone]   Has the prescription been filled recently? No  Is the patient out of the medication? Yes the clonidine  Has the patient been seen for an appointment in the last year OR does the patient have an upcoming appointment? Yes  Can we respond through MyChart? Yes but would prefer a call or text if possible  Please assist patient further

## 2023-11-16 NOTE — Progress Notes (Signed)
Subjective:    Patient ID: Hayley Padilla, female    DOB: May 18, 1951, 72 y.o.   MRN: [id number]  HPI: Hayley Padilla is a 72 y.o. female who returns for follow up appointment for chronic pain and medication refill. She states her pain is located in her left shoulder, she denies falling, lower back radiating into her buttocks and bilateral lower extremities. Also reports bilateral knee pain and bilateral feet with tingling and burning. She rates her pain 10. Hayley Padilla reports no relief of her pain with [place], PMP was Reviewed, in the past she was prescribed oxycodone. We will resume the Oxycodone, she is aware she will need to be seen monthly, she verbalizes understanding.    Her current exercise regime is walking and performing stretching exercises.  Hayley Padilla Morphine equivalent  will be  45.00 MME.   Last UDS was Performed on 08/20/2023, it was consistent.    Pain Inventory Average Pain 10 Pain Right Now 10 My pain is dull, tingling, and aching  In the last 24 hours, has pain interfered with the following? General activity 7 Relation with others 0 Enjoyment of life 0 What TIME of day is your pain at its worst? varies Sleep (in general) Fair  Pain is worse with: walking, bending, standing, and some activites Pain improves with: therapy/exercise and medication Relief from Meds: 5  Family History  Problem Relation Age of Onset  . Colon cancer Neg Hx   . Colon polyps Neg Hx   . Esophageal cancer Neg Hx   . Rectal cancer Neg Hx   . Stomach cancer Neg Hx    Social History   Socioeconomic History  . Marital status: Widowed    Spouse name: Not on file  . Number of children: Not on file  . Years of education: Not on file  . Highest education level: Not on file  Occupational History  . Not on file  Tobacco Use  . Smoking status: Never  . Smokeless tobacco: Current    Types: Chew  . Tobacco comments:    last used 03/17/2020 @ 2200  Vaping Use  . Vaping status: Never Used   Substance and Sexual Activity  . Alcohol use: Not Currently  . Drug use: Never  . Sexual activity: Not on file  Other Topics Concern  . Not on file  Social History Narrative  . Not on file   Social Determinants of Health   Financial Resource Strain: Not on file  Food Insecurity: Not on file  Transportation Needs: Not on file  Physical Activity: Not on file  Stress: Not on file  Social Connections: Not on file   Past Surgical History:  Procedure Laterality Date  . BACK SURGERY     lower back  . IR LUMBAR DISC ASPIRATION W/IMG GUIDE  01/15/2022  . LAMINECTOMY WITH POSTERIOR LATERAL ARTHRODESIS LEVEL 1 N/A 03/16/2022   Procedure: Lumbar One-Two Posterior Lateral Fusion with pedicle screws;  Surgeon: Julio Sicks, MD;  Location: [facility];  Service: Neurosurgery;  Laterality: N/A;  . MULTIPLE TOOTH EXTRACTIONS     Past Surgical History:  Procedure Laterality Date  . BACK SURGERY     lower back  . IR LUMBAR DISC ASPIRATION W/IMG GUIDE  01/15/2022  . LAMINECTOMY WITH POSTERIOR LATERAL ARTHRODESIS LEVEL 1 N/A 03/16/2022   Procedure: Lumbar One-Two Posterior Lateral Fusion with pedicle screws;  Surgeon: Julio Sicks, MD;  Location[facility] OR;  Service: Neurosurgery;  Laterality: N/A;  . MULTIPLE TOOTH EXTRACTIONS  Past Medical History:  Diagnosis Date  . Allergy   . Arthritis   . Diabetes mellitus without complication (HCC)   . Hyperlipidemia   . Hypertension   . PONV (postoperative nausea and vomiting)    There were no vitals taken for this visit.  Opioid Risk Score:   Fall Risk Score:  `1  Depression screen PHQ 2/9     10/20/2023    1:07 PM 10/18/2023    2:58 PM 09/24/2023    1:44 PM 08/20/2023    2:51 PM 07/22/2023    2:14 PM 07/14/2023    1:51 PM 02/25/2022    9:04 AM  Depression screen PHQ 2/9  Decreased Interest 0 0 0 0 0 3 0  Down, Depressed, Hopeless 0  0 0 0 3 0  PHQ - 2 Score 0 0 0 0 0 6 0  Altered sleeping      0   Tired, decreased energy      3   Change in appetite       2   Feeling bad or failure about yourself       0   Trouble concentrating      0   Moving slowly or fidgety/restless      0   Suicidal thoughts      0   PHQ-9 Score      11   Difficult doing work/chores      Somewhat difficult     Review of Systems  Musculoskeletal:  Positive for back pain and gait problem.       Right leg and foot pain B/L buttock pain   All other systems reviewed and are negative.     Objective:   Physical Exam Constitutional:      Appearance: Normal appearance.  Cardiovascular:     Rate and Rhythm: Normal rate and regular rhythm.     Pulses: Normal pulses.     Heart sounds: Normal heart sounds.  Pulmonary:     Effort: Pulmonary effort is normal.     Breath sounds: Normal breath sounds.  Musculoskeletal:     Comments: Normal Muscle Bulk and Muscle Testing Reveals:  Upper Extremities: Right: Decreased ROM 90 Degrees  and Muscle Strength 5/5 Left Upper Extremity: Decreased ROM 45 Degrees and Muscle Strength 5/5 Lumbar Paraspinal Tenderness: L-3-L-5 Lower Extremities: Decreased ROM and Muscle Strength 5/5 Bilateral Lower Extremities Flexion Produces Pain into her Bilateral Patella's Arises from table Slowly using walker for support Antalgic Gait     Skin:    General: Skin is warm and dry.  Neurological:     Mental Status: She is alert and oriented to person, place, and time.  Psychiatric:        Mood and Affect: Mood normal.        Behavior: Behavior normal.         Assessment & Plan:  Lumbar Post Laminectomy Syndrome: Hayley Padilla underwent on 03/17/2023 : Dr Jordan Likes : Lumbar One-Two Posterior Lateral Fusion with pedicle screws  Continue HEP as Tolerated. Continue to Monitor. 11/17/2023 2. Lumbar Radiculitis: Continue Gabapentin. Continue to monitor. 11/17/2023 3. Chronic Bilateral Hip Pain: No complaints Toda[place]. Continue HEP as Tolerated. Continue to Monitor. 11/17/2023 4. Chronic Pain of Bilateral Knees: R>LContinue HEP as Tolerated. Continue to  Monitor. 11/17/2023 5. Chronic Pain Syndrome: RX: Oxycodone 7.5/325 mg one tablet 4 times a day as needed for pain #120.  We will continue the opioid monitoring program, this consists of regular clinic visits, examinations, urine  drug screen, pill counts as well as use of [place] Controlled Substance Reporting system. A 12 month History has been reviewed on the [place] Controlled Substance Reporting System on 11/17/2023. 6, Left Shoulder pain: Denies falling: RX: X-ray. Continue to monitor.    F/U in 1 month

## 2023-11-17 ENCOUNTER — Encounter: Payer: No Typology Code available for payment source | Attending: Registered Nurse | Admitting: Registered Nurse

## 2023-11-17 VITALS — BP 134/80 | HR 86 | Ht 62.0 in | Wt 209.0 lb

## 2023-11-17 DIAGNOSIS — M5416 Radiculopathy, lumbar region: Secondary | ICD-10-CM

## 2023-11-17 DIAGNOSIS — G5793 Unspecified mononeuropathy of bilateral lower limbs: Secondary | ICD-10-CM

## 2023-11-17 DIAGNOSIS — M25512 Pain in left shoulder: Secondary | ICD-10-CM

## 2023-11-17 DIAGNOSIS — G894 Chronic pain syndrome: Secondary | ICD-10-CM | POA: Diagnosis not present

## 2023-11-17 DIAGNOSIS — M170 Bilateral primary osteoarthritis of knee: Secondary | ICD-10-CM | POA: Diagnosis not present

## 2023-11-17 DIAGNOSIS — M1711 Unilateral primary osteoarthritis, right knee: Secondary | ICD-10-CM

## 2023-11-17 DIAGNOSIS — M1712 Unilateral primary osteoarthritis, left knee: Secondary | ICD-10-CM

## 2023-11-17 DIAGNOSIS — Z5181 Encounter for therapeutic drug level monitoring: Secondary | ICD-10-CM | POA: Diagnosis not present

## 2023-11-17 DIAGNOSIS — Z79891 Long term (current) use of opiate analgesic: Secondary | ICD-10-CM

## 2023-11-17 MED ORDER — OXYCODONE-ACETAMINOPHEN 7.5-325 MG PO TABS
1.0000 | ORAL_TABLET | Freq: Four times a day (QID) | ORAL | 0 refills | Status: DC | PRN
Start: 2023-11-17 — End: 2023-12-23

## 2023-11-18 ENCOUNTER — Other Ambulatory Visit: Payer: Self-pay | Admitting: Family

## 2023-11-18 NOTE — Telephone Encounter (Signed)
Requested medication (s) are due for refill today: historical medications  Requested medication (s) are on the active medication list: yes   Last refill:  08/02/23  Future visit scheduled: yes in 1 month  Notes to clinic:  historical medications. Patient out of clonidine. Do you want to order Rxs for 1 month?     Requested Prescriptions  Pending Prescriptions Disp Refills   cloNIDine (CATAPRES) 0.1 MG tablet 60 tablet     Sig: Take 1 tablet (0.1 mg total) by mouth 2 (two) times daily.     Cardiovascular:  Alpha-2 Agonists Passed - 11/15/2023  1:21 PM      Passed - Last BP in normal range    BP Readings from Last 1 Encounters:  11/17/23 134/80         Passed - Last Heart Rate in normal range    Pulse Readings from Last 1 Encounters:  11/17/23 86         Passed - Valid encounter within last 6 months    Recent Outpatient Visits           1 month ago Primary hypertension   [place] [facility], Amy J, NP   3 months ago Type 2 diabetes mellitus with hyperglycemia, without long-term current use of insulin [facility])   [place] [facility], [place], NP   4 months ago Primary hypertension   [place] [facility], [place], NP   4 months ago Encounter to establish care   [facility], [place], NP       Future Appointments             In 1 month Rema Fendt, NP [facility]             potassium chloride (KLOR-CON) 10 MEQ tablet      Sig: Take 1 tablet (10 mEq total) by mouth daily.     Endocrinology:  Minerals - Potassium Supplementation Passed - 11/15/2023  1:21 PM      Passed - K in normal range and within 360 days    Potassium  Date Value Ref Range Status  06/23/2023 4.2 3.5 - 5.2 mmol/L Final         Passed - Cr in normal range and within 360 days    Creat  Date Value Ref Range Status  03/10/2022 0.63 0.60  - 1.00 mg/dL Final   Creatinine, Ser  Date Value Ref Range Status  06/23/2023 0.71 0.57 - 1.00 mg/dL Final         Passed - Valid encounter within last 12 months    Recent Outpatient Visits           1 month ago Primary hypertension   [place] [facility], Amy J, NP   3 months ago Type 2 diabetes mellitus with hyperglycemia, without long-term current use of insulin [facility])   [place] [facility], [place], NP   4 months ago Primary hypertension   [place] [facility], Amy J, NP   4 months ago Encounter to establish care   [facility], Salomon Fick, NP       Future Appointments             In 1 month Zonia Kief, [place]  J, NP [place] [facility]

## 2023-11-18 NOTE — Telephone Encounter (Signed)
Requested medication (s) are due for refill today: Yes  Requested medication (s) are on the active medication list: Yes  Last refill:  06/02/19 (clonidine), 09/15/23 (potassium), lovastatin   Future visit scheduled: Yes  Notes to clinic:  Unable to refill per protocol, last refill by another provider. Patient is upset medications have not been refilled, had last OV in October.      Requested Prescriptions  Pending Prescriptions Disp Refills   cloNIDine (CATAPRES) 0.1 MG tablet 60 tablet     Sig: Take 1 tablet (0.1 mg total) by mouth 2 (two) times daily.     Cardiovascular:  Alpha-2 Agonists Passed - 11/18/2023  4:35 PM      Passed - Last BP in normal range    BP Readings from Last 1 Encounters:  11/17/23 134/80         Passed - Last Heart Rate in normal range    Pulse Readings from Last 1 Encounters:  11/17/23 86         Passed - Valid encounter within last 6 months    Recent Outpatient Visits           1 month ago Primary hypertension   [place] [facility], Amy J, NP   3 months ago Type 2 diabetes mellitus with hyperglycemia, without long-term current use of insulin [facility])   [place] [facility], [place], NP   4 months ago Primary hypertension   [place] [facility], [place], NP   4 months ago Encounter to establish care   [facility], [place], NP       Future Appointments             In 1 month Rema Fendt, NP [facility]             potassium chloride (KLOR-CON) 10 MEQ tablet      Sig: Take 1 tablet (10 mEq total) by mouth daily.     Endocrinology:  Minerals - Potassium Supplementation Passed - 11/18/2023  4:35 PM      Passed - K in normal range and within 360 days    Potassium  Date Value Ref Range Status  06/23/2023 4.2 3.5 - 5.2 mmol/L Final         Passed - Cr in normal range and within  360 days    Creat  Date Value Ref Range Status  03/10/2022 0.63 0.60 - 1.00 mg/dL Final   Creatinine, Ser  Date Value Ref Range Status  06/23/2023 0.71 0.57 - 1.00 mg/dL Final         Passed - Valid encounter within last 12 months    Recent Outpatient Visits           1 month ago Primary hypertension   [place] [facility], Amy J, NP   3 months ago Type 2 diabetes mellitus with hyperglycemia, without long-term current use of insulin [facility])   [place] [facility], [place], NP   4 months ago Primary hypertension   [place] [facility], Amy J, NP   4 months ago Encounter to establish care   [facility], Salomon Fick, NP       Future Appointments  In 1 month Rema Fendt, NP [facility]             lovastatin (MEVACOR) 40 MG tablet      Sig: Take 1 tablet (40 mg total) by mouth every evening. Hold while on Daptomycin - can resume once course completed.     Cardiovascular:  Antilipid - Statins 2 Failed - 11/18/2023  4:35 PM      Failed - Lipid Panel in normal range within the last 12 months    Cholesterol, Total  Date Value Ref Range Status  06/23/2023 158 100 - 199 mg/dL Final   LDL Cholesterol (Calc)  Date Value Ref Range Status  03/10/2022 91 mg/dL (calc) Final    Comment:    Reference range: <100 . Desirable range <100 mg/dL for primary prevention;   <70 mg/dL for patients with CHD or diabetic patients  with > or = 2 CHD risk factors. Marland Kitchen LDL-C is now calculated using the Martin-Hopkins  calculation, which is a validated novel method providing  better accuracy than the Friedewald equation in the  estimation of LDL-C.  Horald Pollen et al. Lenox Ahr. 6045;409(81): 2061-2068  (http://education.[employer].com/faq/FAQ164)    LDL Chol Calc (NIH)  Date Value Ref Range Status  06/23/2023 91 0 - 99  mg/dL Final   HDL  Date Value Ref Range Status  06/23/2023 44 >39 mg/dL Final   Triglycerides  Date Value Ref Range Status  06/23/2023 129 0 - 149 mg/dL Final         Passed - Cr in normal range and within 360 days    Creat  Date Value Ref Range Status  03/10/2022 0.63 0.60 - 1.00 mg/dL Final   Creatinine, Ser  Date Value Ref Range Status  06/23/2023 0.71 0.57 - 1.00 mg/dL Final         Passed - Patient is not pregnant      Passed - Valid encounter within last 12 months    Recent Outpatient Visits           1 month ago Primary hypertension   [place] [facility], Amy J, NP   3 months ago Type 2 diabetes mellitus with hyperglycemia, without long-term current use of insulin (HCC)   [place] [facility], [place], NP   4 months ago Primary hypertension   [place] [facility], Amy J, NP   4 months ago Encounter to establish care   [facility] at [facility], Salomon Fick, NP       Future Appointments             In 1 month Rema Fendt, NP [facility] at [facility]

## 2023-11-18 NOTE — Telephone Encounter (Signed)
Medication Refill -  Most Recent Primary Care Visit:  Provider: Ricky Stabs J  Department: [facility]  Visit Type: OFFICE VISIT  Date: 09/24/2023  Medication: cloNIDine (CATAPRES) 0.1 MG tablet , potassium chloride (KLOR-CON) 10 MEQ tablet ,lovastatin (MEVACOR) 40 MG tablet   Pt called to f/u on medication refills.Pt is upset, stating she went through all of her medication at her first appointment and is always having issues when it comes to getting her medication refills. Stated 90-day refills should be sent in.  Pt requesting a callback as soon as possible.    Has the patient contacted their pharmacy? Yes  (Agent: If yes, when and what did the pharmacy advise?)  Is this the correct pharmacy for this prescription? Yes If no, delete pharmacy and type the correct one.  This is the patient's preferred pharmacy:  [facility] [address], [place] - [address] [place] [address] Phone: [phone] Fax: [phone]   Has the prescription been filled recently? Yes  Is the patient out of the medication? Yes  Has the patient been seen for an appointment in the last year OR does the patient have an upcoming appointment? Yes  Can we respond through MyChart? Yes  Agent: Please be advised that Rx refills may take up to 3 business days. We ask that you follow-up with your pharmacy.

## 2023-11-18 NOTE — Telephone Encounter (Signed)
Clonidine and Potassium Chloride appear as historical medications. During last office visit (09/24/2023) patient prescribed Amlodipine and Losartan for hypertension management. Please let me know if I can further assist.

## 2023-11-19 ENCOUNTER — Other Ambulatory Visit: Payer: Self-pay | Admitting: Family

## 2023-11-19 ENCOUNTER — Telehealth: Payer: Self-pay | Admitting: Family

## 2023-11-19 DIAGNOSIS — E785 Hyperlipidemia, unspecified: Secondary | ICD-10-CM

## 2023-11-19 MED ORDER — LOVASTATIN 40 MG PO TABS: 40.0000 mg | ORAL_TABLET | Freq: Every evening | ORAL | 0 refills | Status: DC

## 2023-11-19 NOTE — Telephone Encounter (Signed)
Pt called back about her prescriptions wants to know why the pharmacy is having a hard time getting the order.  Pt states she especially needs her heart medications.

## 2023-11-19 NOTE — Telephone Encounter (Signed)
Spoke to patient and informed her of why her prescriptions were declined.

## 2023-11-19 NOTE — Telephone Encounter (Signed)
 Clonidine and Potassium Chloride appear as historical medications. During last office visit (09/24/2023) patient prescribed Amlodipine and Losartan for hypertension management. Please let me know if I can further assist.

## 2023-11-19 NOTE — Telephone Encounter (Signed)
Copied from CRM [id number]. Topic: General - Other >> Nov 19, 2023 12:52 PM Dondra Prader E wrote: Reason for CRM: Pt wants to know why her prescriptions were declined, I read the note from her PCP but she still has questions

## 2023-11-19 NOTE — Telephone Encounter (Signed)
Clonidine and Potassium Chloride appear as historical medications. During last office visit (09/24/2023) patient prescribed Amlodipine and Losartan for hypertension management. Lovastatin prescribed. Please let me know if I can further assist.

## 2023-11-19 NOTE — Telephone Encounter (Signed)
Clonidine and potassium were on the patient medication list at the day of appointment 09/24/2023.  Patient is calling inquiring about these medications.   Please advise on refill.

## 2023-11-22 ENCOUNTER — Encounter: Payer: Self-pay | Admitting: Registered Nurse

## 2023-11-22 NOTE — Telephone Encounter (Signed)
Clonidine and Potassium Chloride appear as historical medications. During last office visit (09/24/2023) patient prescribed Amlodipine and Losartan for hypertension management. Lovastatin prescribed 11/19/2023. Please let me know if I can further assist.

## 2023-11-25 ENCOUNTER — Encounter (INDEPENDENT_AMBULATORY_CARE_PROVIDER_SITE_OTHER): Payer: No Typology Code available for payment source | Admitting: Family

## 2023-11-25 NOTE — Progress Notes (Signed)
Erroneous encounter-disregard

## 2023-12-06 ENCOUNTER — Ambulatory Visit
Admission: RE | Admit: 2023-12-06 | Discharge: 2023-12-06 | Disposition: A | Discharge status: Home / Self Care | Payer: No Typology Code available for payment source | Source: Ambulatory Visit | Attending: Registered Nurse

## 2023-12-06 DIAGNOSIS — M19012 Primary osteoarthritis, left shoulder: Secondary | ICD-10-CM | POA: Diagnosis not present

## 2023-12-06 DIAGNOSIS — M25512 Pain in left shoulder: Secondary | ICD-10-CM | POA: Diagnosis not present

## 2023-12-17 ENCOUNTER — Encounter: Payer: No Typology Code available for payment source | Admitting: Registered Nurse

## 2023-12-17 ENCOUNTER — Ambulatory Visit (INDEPENDENT_AMBULATORY_CARE_PROVIDER_SITE_OTHER): Payer: No Typology Code available for payment source | Admitting: Family

## 2023-12-17 ENCOUNTER — Other Ambulatory Visit (HOSPITAL_COMMUNITY)
Admission: RE | Admit: 2023-12-17 | Discharge: 2023-12-17 | Disposition: A | Discharge status: Home / Self Care | Payer: No Typology Code available for payment source | Source: Ambulatory Visit | Attending: Family | Admitting: Family

## 2023-12-17 VITALS — BP 145/74 | HR 82 | Temp 98.2°F | Ht 62.0 in | Wt 209.2 lb

## 2023-12-17 DIAGNOSIS — I10 Essential (primary) hypertension: Secondary | ICD-10-CM

## 2023-12-17 DIAGNOSIS — E1165 Type 2 diabetes mellitus with hyperglycemia: Secondary | ICD-10-CM | POA: Diagnosis not present

## 2023-12-17 DIAGNOSIS — N898 Other specified noninflammatory disorders of vagina: Secondary | ICD-10-CM | POA: Insufficient documentation

## 2023-12-17 DIAGNOSIS — E119 Type 2 diabetes mellitus without complications: Secondary | ICD-10-CM

## 2023-12-17 DIAGNOSIS — E785 Hyperlipidemia, unspecified: Secondary | ICD-10-CM

## 2023-12-17 DIAGNOSIS — Z7984 Long term (current) use of oral hypoglycemic drugs: Secondary | ICD-10-CM | POA: Diagnosis not present

## 2023-12-17 MED ORDER — LOSARTAN POTASSIUM 100 MG PO TABS: 100.0000 mg | ORAL_TABLET | Freq: Every day | ORAL | 0 refills | Status: DC

## 2023-12-17 MED ORDER — AMLODIPINE BESYLATE 10 MG PO TABS: 10.0000 mg | ORAL_TABLET | Freq: Every day | ORAL | 0 refills | Status: DC

## 2023-12-17 MED ORDER — CLONIDINE HCL 0.1 MG PO TABS: 0.1000 mg | ORAL_TABLET | Freq: Two times a day (BID) | ORAL | 0 refills | Status: DC

## 2023-12-17 MED ORDER — METFORMIN HCL ER 500 MG PO TB24: 500.0000 mg | ORAL_TABLET | Freq: Every day | ORAL | 0 refills | Status: DC

## 2023-12-17 NOTE — Progress Notes (Signed)
 Patient ID: Hayley Padilla, female    DOB: 09/30/1951  MRN: [id number]  CC: Chronic Conditions Follow-Up  Subjective: Hayley Padilla is a 73 y.o. female who presents for chronic conditions follow-up.  Her concerns today include:  - Doing well on Amlodipine  and Losartan , no issues/concerns. States she needs refills on Clonidine . She does not complain of red flag symptoms such as but not limited to chest pain, shortness of breath, worst headache of life, nausea/vomiting.  - Doing well on Metformin  XR, no issues/concerns. Denies red flag symptoms associated with diabetes.  - States thinks she has a yeast infection. States Fluconazole  tablet caused joint tightness. - Established with Orthopedics.   Patient Active Problem List   Diagnosis Date Noted   Diabetes mellitus, type 2 (HCC) 06/24/2023   Vertebral osteomyelitis (HCC) 03/12/2022   Hypokalemia 03/12/2022   Edema of right lower extremity 01/15/2022   Essential hypertension 01/14/2022   Chronic pain 01/14/2022   HLD (hyperlipidemia) 01/14/2022   Discitis 01/14/2022   Obesity (BMI 30-39.9) 01/13/2022   S/P lumbar spinal fusion 01/13/2022   Discitis of lumbar region 01/13/2022   Kyphosis 01/13/2022   Degenerative spondylolisthesis 06/12/2019     Current Outpatient Medications on File Prior to Visit  Medication Sig Dispense Refill   acetaminophen  (TYLENOL ) 650 MG CR tablet Take 650 mg by mouth every 8 (eight) hours as needed for pain.     Cholecalciferol  (VITAMIN D ) 125 MCG (5000 UT) CAPS      diclofenac  (VOLTAREN ) 75 MG EC tablet Take 1 tablet (75 mg total) by mouth 2 (two) times daily. 180 tablet 0   furosemide  (LASIX ) 40 MG tablet Take 40 mg by mouth daily.     gabapentin  (NEURONTIN ) 300 MG capsule Take 1 capsule (300 mg total) by mouth 3 (three) times daily. 270 capsule 0   Iron , Ferrous Sulfate , 325 (65 Fe) MG TABS Take 325 mg by mouth daily. 90 tablet 0   lovastatin  (MEVACOR ) 40 MG tablet Take 1 tablet (40 mg total) by mouth  every evening. Hold while on Daptomycin  - can resume once course completed. 90 tablet 0   oxyCODONE -acetaminophen  (PERCOCET) 7.5-325 MG tablet Take 1 tablet by mouth 4 (four) times daily as needed. 120 tablet 0   polyethylene glycol (MIRALAX  / GLYCOLAX ) 17 g packet Take 17 g by mouth daily as needed. 90 each 0   senna-docusate (SENOKOT-S) 8.6-50 MG tablet Take 1 tablet by mouth at bedtime.     Vitamin D , Ergocalciferol , (DRISDOL ) 1.25 MG (50000 UNIT) CAPS capsule Take 50,000 Units by mouth every Monday.     potassium chloride  (KLOR-CON ) 10 MEQ tablet Take 10 mEq by mouth daily. (Patient not taking: Reported on 12/17/2023)     No current facility-administered medications on file prior to visit.    Allergies  Allergen Reactions   Penicillins Anaphylaxis    Did it involve swelling of the face/tongue/throat, SOB, or low BP? Yes Did it involve sudden or severe rash/hives, skin peeling, or any reaction on the inside of your mouth or nose? No Did you need to seek medical attention at a hospital or doctor's office? Yes When did it last happen?      Been a While  If all above answers are "NO", may proceed with cephalosporin use.    Aspirin Other (See Comments)    brusing _ patient requested to be listed    Social History   Socioeconomic History   Marital status: Widowed    Spouse name: Not on  file   Number of children: Not on file   Years of education: Not on file   Highest education level: Not on file  Occupational History   Not on file  Tobacco Use   Smoking status: Never   Smokeless tobacco: Current    Types: Chew   Tobacco comments:    last used 03/17/2020 @ 2200  Vaping Use   Vaping status: Never Used  Substance and Sexual Activity   Alcohol use: Not Currently   Drug use: Never   Sexual activity: Not on file  Other Topics Concern   Not on file  Social History Narrative   Not on file   Social Drivers of Health   Financial Resource Strain: Not on file  Food Insecurity:  Not on file  Transportation Needs: Not on file  Physical Activity: Not on file  Stress: Not on file  Social Connections: Not on file  Intimate Partner Violence: Not on file    Family History  Problem Relation Age of Onset   Colon cancer Neg Hx    Colon polyps Neg Hx    Esophageal cancer Neg Hx    Rectal cancer Neg Hx    Stomach cancer Neg Hx     Past Surgical History:  Procedure Laterality Date   BACK SURGERY     lower back   IR LUMBAR DISC ASPIRATION W/IMG GUIDE  01/15/2022   LAMINECTOMY WITH POSTERIOR LATERAL ARTHRODESIS LEVEL 1 N/A 03/16/2022   Procedure: Lumbar One-Two Posterior Lateral Fusion with pedicle screws;  Surgeon: Louis Shove, MD;  Location: [facility] OR;  Service: Neurosurgery;  Laterality: N/A;   MULTIPLE TOOTH EXTRACTIONS      ROS: Review of Systems Negative except as stated above  PHYSICAL EXAM: BP (!) 145/74   Pulse 82   Temp 98.2 F (36.8 C) (Oral)   Ht 5' 2 (1.575 m)   Wt 209 lb 3.2 oz (94.9 kg)   SpO2 99%   BMI 38.26 kg/m   Physical Exam HENT:     Head: Normocephalic and atraumatic.     Nose: Nose normal.     Mouth/Throat:     Mouth: Mucous membranes are moist.     Pharynx: Oropharynx is clear.  Eyes:     Extraocular Movements: Extraocular movements intact.     Conjunctiva/sclera: Conjunctivae normal.     Pupils: Pupils are equal, round, and reactive to light.  Cardiovascular:     Rate and Rhythm: Normal rate and regular rhythm.     Pulses: Normal pulses.     Heart sounds: Normal heart sounds.  Pulmonary:     Effort: Pulmonary effort is normal.     Breath sounds: Normal breath sounds.  Musculoskeletal:        General: Normal range of motion.     Cervical back: Normal range of motion and neck supple.  Neurological:     General: No focal deficit present.     Mental Status: She is alert and oriented to person, place, and time.  Psychiatric:        Mood and Affect: Mood normal.        Behavior: Behavior normal.     ASSESSMENT AND  PLAN: 1. Primary hypertension (Primary) - Continue Amlodipine , Losartan , and Clonidine  as prescribed. - Routine screening.  - Counseled on blood pressure goal of less than 140/90, low-sodium, DASH diet, medication compliance, 150 minutes of moderate intensity exercise per week as tolerated. Discussed medication compliance, adverse effects. - Follow-up with primary provider in  4 weeks or sooner if needed for blood pressure check.  - amLODipine  (NORVASC ) 10 MG tablet; Take 1 tablet (10 mg total) by mouth daily.  Dispense: 90 tablet; Refill: 0 - losartan  (COZAAR ) 100 MG tablet; Take 1 tablet (100 mg total) by mouth daily.  Dispense: 90 tablet; Refill: 0 - cloNIDine  (CATAPRES ) 0.1 MG tablet; Take 1 tablet (0.1 mg total) by mouth 2 (two) times daily.  Dispense: 180 tablet; Refill: 0 - Basic Metabolic Panel  2. Type 2 diabetes mellitus with hyperglycemia, without long-term current use of insulin (HCC) - Continue Metformin  XR as prescribed. Counseled on medication adherence/adverse effects.  - Hemoglobin A1c result pending.  - Discussed the importance of healthy eating habits, low-carbohydrate diet, low-sugar diet, regular aerobic exercise (at least 150 minutes a week as tolerated) and medication compliance to achieve or maintain control of diabetes. - Follow-up with primary provider as scheduled.  - metFORMIN  (GLUCOPHAGE -XR) 500 MG 24 hr tablet; Take 1 tablet (500 mg total) by mouth daily with breakfast.  Dispense: 90 tablet; Refill: 0 - POCT glycosylated hemoglobin (Hb A1C); Future  3. Diabetic eye exam [facility]) - Referral to Ophthalmology for evaluation/management. - Ambulatory referral to Ophthalmology  4. Encounter for diabetic foot exam [facility]) - Referral to Podiatry for evaluation/management. - Ambulatory referral to Podiatry  5. Hyperlipidemia, unspecified hyperlipidemia type - Continue Lovastatin  as prescribed. Counseled on medication adherence/adverse effects.  - Follow-up with primary  provider in 3 months or sooner if needed.   6. Vaginal irritation - Routine screening.  - Cervicovaginal ancillary only    Patient was given the opportunity to ask questions.  Patient verbalized understanding of the plan and was able to repeat key elements of the plan. Patient was given clear instructions to go to Emergency Department or return to medical center if symptoms don't improve, worsen, or new problems develop.The patient verbalized understanding.   Orders Placed This Encounter  Procedures   Basic Metabolic Panel   Ambulatory referral to Podiatry   Ambulatory referral to Ophthalmology   POCT glycosylated hemoglobin (Hb A1C)     Requested Prescriptions   Signed Prescriptions Disp Refills   amLODipine  (NORVASC ) 10 MG tablet 90 tablet 0    Sig: Take 1 tablet (10 mg total) by mouth daily.   losartan  (COZAAR ) 100 MG tablet 90 tablet 0    Sig: Take 1 tablet (100 mg total) by mouth daily.   metFORMIN  (GLUCOPHAGE -XR) 500 MG 24 hr tablet 90 tablet 0    Sig: Take 1 tablet (500 mg total) by mouth daily with breakfast.   cloNIDine  (CATAPRES ) 0.1 MG tablet 180 tablet 0    Sig: Take 1 tablet (0.1 mg total) by mouth 2 (two) times daily.    Return in about 4 weeks (around 01/14/2024) for Follow-Up or next available chronic conditions.  Greig JINNY Drones, NP

## 2023-12-17 NOTE — Progress Notes (Signed)
 Patient states she thinks she is supposed to talk to you due to being taken off medication.

## 2023-12-17 NOTE — Progress Notes (Deleted)
 Subjective:    Patient ID: Hayley Padilla, female    DOB: 1951/07/08, 73 y.o.   MRN: [id number]  HPI   Pain Inventory Average Pain {NUMBERS; 0-10:5044} Pain Right Now {NUMBERS; 0-10:5044} My pain is {PAIN DESCRIPTION:21022940}  In the last 24 hours, has pain interfered with the following? General activity {NUMBERS; 0-10:5044} Relation with others {NUMBERS; 0-10:5044} Enjoyment of life {NUMBERS; 0-10:5044} What TIME of day is your pain at its worst? {time of day:24191} Sleep (in general) {BHH GOOD/FAIR/POOR:22877}  Pain is worse with: {ACTIVITIES:21022942} Pain improves with: {PAIN IMPROVES TPUY:78977056} Relief from Meds: {NUMBERS; 0-10:5044}  Family History  Problem Relation Age of Onset   Colon cancer Neg Hx    Colon polyps Neg Hx    Esophageal cancer Neg Hx    Rectal cancer Neg Hx    Stomach cancer Neg Hx    Social History   Socioeconomic History   Marital status: Widowed    Spouse name: Not on file   Number of children: Not on file   Years of education: Not on file   Highest education level: Not on file  Occupational History   Not on file  Tobacco Use   Smoking status: Never   Smokeless tobacco: Current    Types: Chew   Tobacco comments:    last used 03/17/2020 @ 2200  Vaping Use   Vaping status: Never Used  Substance and Sexual Activity   Alcohol use: Not Currently   Drug use: Never   Sexual activity: Not on file  Other Topics Concern   Not on file  Social History Narrative   Not on file   Social Drivers of Health   Financial Resource Strain: Not on file  Food Insecurity: Not on file  Transportation Needs: Not on file  Physical Activity: Not on file  Stress: Not on file  Social Connections: Not on file   Past Surgical History:  Procedure Laterality Date   BACK SURGERY     lower back   IR LUMBAR DISC ASPIRATION W/IMG GUIDE  01/15/2022   LAMINECTOMY WITH POSTERIOR LATERAL ARTHRODESIS LEVEL 1 N/A 03/16/2022   Procedure: Lumbar One-Two Posterior  Lateral Fusion with pedicle screws;  Surgeon: Louis Shove, MD;  Location: [facility] OR;  Service: Neurosurgery;  Laterality: N/A;   MULTIPLE TOOTH EXTRACTIONS     Past Surgical History:  Procedure Laterality Date   BACK SURGERY     lower back   IR LUMBAR DISC ASPIRATION W/IMG GUIDE  01/15/2022   LAMINECTOMY WITH POSTERIOR LATERAL ARTHRODESIS LEVEL 1 N/A 03/16/2022   Procedure: Lumbar One-Two Posterior Lateral Fusion with pedicle screws;  Surgeon: Louis Shove, MD;  Location: [facility] OR;  Service: Neurosurgery;  Laterality: N/A;   MULTIPLE TOOTH EXTRACTIONS     Past Medical History:  Diagnosis Date   Allergy    Arthritis    Diabetes mellitus without complication (HCC)    Hyperlipidemia    Hypertension    PONV (postoperative nausea and vomiting)    There were no vitals taken for this visit.  Opioid Risk Score:   Fall Risk Score:  `1  Depression screen PHQ 2/9     10/20/2023    1:07 PM 10/18/2023    2:58 PM 09/24/2023    1:44 PM 08/20/2023    2:51 PM 07/22/2023    2:14 PM 07/14/2023    1:51 PM 02/25/2022    9:04 AM  Depression screen PHQ 2/9  Decreased Interest 0 0 0 0 0 3 0  Down, Depressed,  Hopeless 0  0 0 0 3 0  PHQ - 2 Score 0 0 0 0 0 6 0  Altered sleeping      0   Tired, decreased energy      3   Change in appetite      2   Feeling bad or failure about yourself       0   Trouble concentrating      0   Moving slowly or fidgety/restless      0   Suicidal thoughts      0   PHQ-9 Score      11   Difficult doing work/chores      Somewhat difficult     Review of Systems     Objective:   Physical Exam        Assessment & Plan:

## 2023-12-18 LAB — BASIC METABOLIC PANEL
BUN/Creatinine Ratio: 12 (ref 12–28)
BUN: 8 mg/dL (ref 8–27)
CO2: 25 mmol/L (ref 20–29)
Calcium: 9.1 mg/dL (ref 8.7–10.3)
Chloride: 105 mmol/L (ref 96–106)
Creatinine, Ser: 0.66 mg/dL (ref 0.57–1.00)
Glucose: 109 mg/dL — ABNORMAL HIGH (ref 70–99)
Potassium: 3.7 mmol/L (ref 3.5–5.2)
Sodium: 145 mmol/L — ABNORMAL HIGH (ref 134–144)
eGFR: 92 mL/min/{1.73_m2} (ref >59)

## 2023-12-20 ENCOUNTER — Other Ambulatory Visit: Payer: Self-pay | Admitting: Family

## 2023-12-20 ENCOUNTER — Encounter: Payer: No Typology Code available for payment source | Admitting: Registered Nurse

## 2023-12-20 ENCOUNTER — Telehealth: Payer: Self-pay | Admitting: Registered Nurse

## 2023-12-20 ENCOUNTER — Telehealth: Payer: Self-pay | Admitting: Family

## 2023-12-20 DIAGNOSIS — B3731 Acute candidiasis of vulva and vagina: Secondary | ICD-10-CM

## 2023-12-20 DIAGNOSIS — B9689 Other specified bacterial agents as the cause of diseases classified elsewhere: Secondary | ICD-10-CM

## 2023-12-20 LAB — CERVICOVAGINAL ANCILLARY ONLY
Bacterial Vaginitis (gardnerella): POSITIVE — AB
Candida Glabrata: POSITIVE — AB
Candida Vaginitis: NEGATIVE
Chlamydia: NEGATIVE
Comment: NEGATIVE
Comment: NEGATIVE
Comment: NEGATIVE
Comment: NEGATIVE
Comment: NEGATIVE
Comment: NORMAL
Neisseria Gonorrhea: NEGATIVE
Trichomonas: NEGATIVE

## 2023-12-20 MED ORDER — METRONIDAZOLE 500 MG PO TABS: 500.0000 mg | ORAL_TABLET | Freq: Two times a day (BID) | ORAL | 0 refills | Status: DC

## 2023-12-20 MED ORDER — CLOTRIMAZOLE 1 % VA CREA: 1.0000 | TOPICAL_CREAM | Freq: Every day | VAGINAL | 0 refills | Status: DC

## 2023-12-20 NOTE — Telephone Encounter (Signed)
 X-ray results Reviewed.  Hayley Padilla will think about if she would like to receive an injection with Dr Wynn Banker. She will call office with her decision, she verbalizes understanding.

## 2023-12-20 NOTE — Telephone Encounter (Signed)
 A document form has been faxed:  Prescriber Response Form , to be filled out by provider. Send document back via Fax within 5-days. Document is located in providers tray at front office.           Fax number:  [phone]

## 2023-12-22 ENCOUNTER — Encounter: Payer: No Typology Code available for payment source | Admitting: Registered Nurse

## 2023-12-22 ENCOUNTER — Other Ambulatory Visit: Payer: Self-pay

## 2023-12-22 DIAGNOSIS — I10 Essential (primary) hypertension: Secondary | ICD-10-CM

## 2023-12-22 DIAGNOSIS — B9689 Other specified bacterial agents as the cause of diseases classified elsewhere: Secondary | ICD-10-CM

## 2023-12-22 DIAGNOSIS — B3731 Acute candidiasis of vulva and vagina: Secondary | ICD-10-CM

## 2023-12-22 DIAGNOSIS — E1165 Type 2 diabetes mellitus with hyperglycemia: Secondary | ICD-10-CM

## 2023-12-22 MED ORDER — LOSARTAN POTASSIUM 100 MG PO TABS: 100.0000 mg | ORAL_TABLET | Freq: Every day | ORAL | 0 refills | Status: DC

## 2023-12-22 MED ORDER — METRONIDAZOLE 500 MG PO TABS: 500.0000 mg | ORAL_TABLET | Freq: Two times a day (BID) | ORAL | 0 refills | Status: AC

## 2023-12-22 MED ORDER — METFORMIN HCL ER 500 MG PO TB24: 500.0000 mg | ORAL_TABLET | Freq: Every day | ORAL | 0 refills | Status: DC

## 2023-12-22 MED ORDER — CLONIDINE HCL 0.1 MG PO TABS: 0.1000 mg | ORAL_TABLET | Freq: Two times a day (BID) | ORAL | 0 refills | Status: DC

## 2023-12-22 MED ORDER — AMLODIPINE BESYLATE 10 MG PO TABS: 10.0000 mg | ORAL_TABLET | Freq: Every day | ORAL | 0 refills | Status: DC

## 2023-12-22 MED ORDER — CLOTRIMAZOLE 1 % VA CREA: 1.0000 | TOPICAL_CREAM | Freq: Every day | VAGINAL | 0 refills | Status: AC

## 2023-12-22 NOTE — Progress Notes (Addendum)
 Subjective:    Patient ID: Hayley Padilla, female    DOB: May 30, 1951, 73 y.o.   MRN: [id number]  HPI: Hayley Padilla is a 73 y.o. female who returns for follow up appointment for chronic pain and medication refill. She states her pain is located in her lower back, bilateral knee pain, she also reports right foot numbness. She rates her pain 8. Her current exercise regime is walking with walker, using her peddler daily and performing stretching exercises.  Hayley Padilla Morphine  equivalent is 45.00 MME.   UDS ordered today.    Pain Inventory Average Pain 8 Pain Right Now 8 My pain is constant, dull, and aching  In the last 24 hours, has pain interfered with the following? General activity 10 Relation with others 0 Enjoyment of life 10 What TIME of day is your pain at its worst? varies Sleep (in general) Fair  Pain is worse with: walking Pain improves with: rest and medication Relief from Meds: 7  Family History  Problem Relation Age of Onset   Colon cancer Neg Hx    Colon polyps Neg Hx    Esophageal cancer Neg Hx    Rectal cancer Neg Hx    Stomach cancer Neg Hx    Social History   Socioeconomic History   Marital status: Widowed    Spouse name: Not on file   Number of children: Not on file   Years of education: Not on file   Highest education level: Not on file  Occupational History   Not on file  Tobacco Use   Smoking status: Never   Smokeless tobacco: Current    Types: Chew   Tobacco comments:    last used 03/17/2020 @ 2200  Vaping Use   Vaping status: Never Used  Substance and Sexual Activity   Alcohol use: Not Currently   Drug use: Never   Sexual activity: Not on file  Other Topics Concern   Not on file  Social History Narrative   Not on file   Social Drivers of Health   Financial Resource Strain: Not on file  Food Insecurity: Not on file  Transportation Needs: Not on file  Physical Activity: Not on file  Stress: Not on file  Social Connections: Not on  file   Past Surgical History:  Procedure Laterality Date   BACK SURGERY     lower back   IR LUMBAR DISC ASPIRATION W/IMG GUIDE  01/15/2022   LAMINECTOMY WITH POSTERIOR LATERAL ARTHRODESIS LEVEL 1 N/A 03/16/2022   Procedure: Lumbar One-Two Posterior Lateral Fusion with pedicle screws;  Surgeon: Louis Shove, MD;  Location: [facility] OR;  Service: Neurosurgery;  Laterality: N/A;   MULTIPLE TOOTH EXTRACTIONS     Past Surgical History:  Procedure Laterality Date   BACK SURGERY     lower back   IR LUMBAR DISC ASPIRATION W/IMG GUIDE  01/15/2022   LAMINECTOMY WITH POSTERIOR LATERAL ARTHRODESIS LEVEL 1 N/A 03/16/2022   Procedure: Lumbar One-Two Posterior Lateral Fusion with pedicle screws;  Surgeon: Louis Shove, MD;  Location: [facility] OR;  Service: Neurosurgery;  Laterality: N/A;   MULTIPLE TOOTH EXTRACTIONS     Past Medical History:  Diagnosis Date   Allergy    Arthritis    Diabetes mellitus without complication (HCC)    Hyperlipidemia    Hypertension    PONV (postoperative nausea and vomiting)    There were no vitals taken for this visit.  Opioid Risk Score:   Fall Risk Score:  `1  Depression  screen PHQ 2/9     10/20/2023    1:07 PM 10/18/2023    2:58 PM 09/24/2023    1:44 PM 08/20/2023    2:51 PM 07/22/2023    2:14 PM 07/14/2023    1:51 PM 02/25/2022    9:04 AM  Depression screen PHQ 2/9  Decreased Interest 0 0 0 0 0 3 0  Down, Depressed, Hopeless 0  0 0 0 3 0  PHQ - 2 Score 0 0 0 0 0 6 0  Altered sleeping      0   Tired, decreased energy      3   Change in appetite      2   Feeling bad or failure about yourself       0   Trouble concentrating      0   Moving slowly or fidgety/restless      0   Suicidal thoughts      0   PHQ-9 Score      11   Difficult doing work/chores      Somewhat difficult     Review of Systems  Musculoskeletal:  Positive for back pain and gait problem.       Pain in both feet, left shoulder, legs      Objective:   Physical Exam Vitals and nursing note reviewed.   Constitutional:      Appearance: Normal appearance.  Cardiovascular:     Rate and Rhythm: Normal rate and regular rhythm.     Pulses: Normal pulses.     Heart sounds: Normal heart sounds.  Pulmonary:     Effort: Pulmonary effort is normal.     Breath sounds: Normal breath sounds.  Musculoskeletal:     Comments: Normal Muscle Bulk and Muscle Testing Reveals:  Upper Extremities:Right:  Decreased ROM 90 Degrees  and Muscle Strength 5/5 Left Upper Extremity: Decreased ROM 45 Degrees and Muscle Strength 5/5  Lower Extremities: Right: Full ROM and Muscle Strength 5/5 Left Lower Extremity: Decreased ROM and Muscle Strength 5/5 Left Lower Extremity Flexion Produces Pain into her Left Patella Arises from chair slowly using walker for support Antalgic Gait     Skin:    General: Skin is warm and dry.  Neurological:     Mental Status: She is alert and oriented to person, place, and time.  Psychiatric:        Mood and Affect: Mood normal.        Behavior: Behavior normal.         Assessment & Plan:  Lumbar Post Laminectomy Syndrome: Hayley Padilla underwent on 03/17/2023 : Dr Louis : Lumbar One-Two Posterior Lateral Fusion with pedicle screws  Continue HEP as Tolerated. Continue to Monitor. 12/23/2023 2. Lumbar Radiculitis: Continue Gabapentin . Continue to monitor. 12/23/2023 3. Chronic Bilateral Hip Pain: No complaints [place]. Continue HEP as Tolerated. Continue to Monitor. 12/23/2023 4. Chronic Pain of Bilateral Knees: R>LContinue HEP as Tolerated. Continue to Monitor. 12/23/2023 5. Chronic Pain Syndrome: Refilled: Oxycodone  7.5/325 mg one tablet 4 times a day as needed for pain #120.  We will continue the opioid monitoring program, this consists of regular clinic visits, examinations, urine drug screen, pill counts as well as use of [place]  Controlled Substance Reporting system. A 12 month History has been reviewed on the [place]  Controlled Substance Reporting System on  12/23/2023. 6, Left Shoulder pain: No complaints today. Continue to monitor.     F/U in 1 month

## 2023-12-22 NOTE — Progress Notes (Deleted)
 Subjective:    Patient ID: Hayley Padilla, female    DOB: 1951/07/08, 73 y.o.   MRN: [id number]  HPI   Pain Inventory Average Pain {NUMBERS; 0-10:5044} Pain Right Now {NUMBERS; 0-10:5044} My pain is {PAIN DESCRIPTION:21022940}  In the last 24 hours, has pain interfered with the following? General activity {NUMBERS; 0-10:5044} Relation with others {NUMBERS; 0-10:5044} Enjoyment of life {NUMBERS; 0-10:5044} What TIME of day is your pain at its worst? {time of day:24191} Sleep (in general) {BHH GOOD/FAIR/POOR:22877}  Pain is worse with: {ACTIVITIES:21022942} Pain improves with: {PAIN IMPROVES TPUY:78977056} Relief from Meds: {NUMBERS; 0-10:5044}  Family History  Problem Relation Age of Onset   Colon cancer Neg Hx    Colon polyps Neg Hx    Esophageal cancer Neg Hx    Rectal cancer Neg Hx    Stomach cancer Neg Hx    Social History   Socioeconomic History   Marital status: Widowed    Spouse name: Not on file   Number of children: Not on file   Years of education: Not on file   Highest education level: Not on file  Occupational History   Not on file  Tobacco Use   Smoking status: Never   Smokeless tobacco: Current    Types: Chew   Tobacco comments:    last used 03/17/2020 @ 2200  Vaping Use   Vaping status: Never Used  Substance and Sexual Activity   Alcohol use: Not Currently   Drug use: Never   Sexual activity: Not on file  Other Topics Concern   Not on file  Social History Narrative   Not on file   Social Drivers of Health   Financial Resource Strain: Not on file  Food Insecurity: Not on file  Transportation Needs: Not on file  Physical Activity: Not on file  Stress: Not on file  Social Connections: Not on file   Past Surgical History:  Procedure Laterality Date   BACK SURGERY     lower back   IR LUMBAR DISC ASPIRATION W/IMG GUIDE  01/15/2022   LAMINECTOMY WITH POSTERIOR LATERAL ARTHRODESIS LEVEL 1 N/A 03/16/2022   Procedure: Lumbar One-Two Posterior  Lateral Fusion with pedicle screws;  Surgeon: Louis Shove, MD;  Location: [facility] OR;  Service: Neurosurgery;  Laterality: N/A;   MULTIPLE TOOTH EXTRACTIONS     Past Surgical History:  Procedure Laterality Date   BACK SURGERY     lower back   IR LUMBAR DISC ASPIRATION W/IMG GUIDE  01/15/2022   LAMINECTOMY WITH POSTERIOR LATERAL ARTHRODESIS LEVEL 1 N/A 03/16/2022   Procedure: Lumbar One-Two Posterior Lateral Fusion with pedicle screws;  Surgeon: Louis Shove, MD;  Location: [facility] OR;  Service: Neurosurgery;  Laterality: N/A;   MULTIPLE TOOTH EXTRACTIONS     Past Medical History:  Diagnosis Date   Allergy    Arthritis    Diabetes mellitus without complication (HCC)    Hyperlipidemia    Hypertension    PONV (postoperative nausea and vomiting)    There were no vitals taken for this visit.  Opioid Risk Score:   Fall Risk Score:  `1  Depression screen PHQ 2/9     10/20/2023    1:07 PM 10/18/2023    2:58 PM 09/24/2023    1:44 PM 08/20/2023    2:51 PM 07/22/2023    2:14 PM 07/14/2023    1:51 PM 02/25/2022    9:04 AM  Depression screen PHQ 2/9  Decreased Interest 0 0 0 0 0 3 0  Down, Depressed,  Hopeless 0  0 0 0 3 0  PHQ - 2 Score 0 0 0 0 0 6 0  Altered sleeping      0   Tired, decreased energy      3   Change in appetite      2   Feeling bad or failure about yourself       0   Trouble concentrating      0   Moving slowly or fidgety/restless      0   Suicidal thoughts      0   PHQ-9 Score      11   Difficult doing work/chores      Somewhat difficult     Review of Systems     Objective:   Physical Exam        Assessment & Plan:

## 2023-12-22 NOTE — Telephone Encounter (Signed)
 Paperwork at desk, but patient switched her preferred pharmacy.

## 2023-12-23 ENCOUNTER — Encounter: Payer: No Typology Code available for payment source | Attending: Registered Nurse | Admitting: Registered Nurse

## 2023-12-23 ENCOUNTER — Encounter: Payer: Self-pay | Admitting: Registered Nurse

## 2023-12-23 VITALS — BP 132/74 | HR 78 | Ht 62.0 in | Wt 211.0 lb

## 2023-12-23 DIAGNOSIS — Z5181 Encounter for therapeutic drug level monitoring: Secondary | ICD-10-CM

## 2023-12-23 DIAGNOSIS — Z79891 Long term (current) use of opiate analgesic: Secondary | ICD-10-CM

## 2023-12-23 DIAGNOSIS — M1711 Unilateral primary osteoarthritis, right knee: Secondary | ICD-10-CM

## 2023-12-23 DIAGNOSIS — M25551 Pain in right hip: Secondary | ICD-10-CM | POA: Diagnosis not present

## 2023-12-23 DIAGNOSIS — M5416 Radiculopathy, lumbar region: Secondary | ICD-10-CM

## 2023-12-23 DIAGNOSIS — M1712 Unilateral primary osteoarthritis, left knee: Secondary | ICD-10-CM

## 2023-12-23 DIAGNOSIS — M25552 Pain in left hip: Secondary | ICD-10-CM | POA: Diagnosis not present

## 2023-12-23 DIAGNOSIS — M25512 Pain in left shoulder: Secondary | ICD-10-CM | POA: Diagnosis not present

## 2023-12-23 DIAGNOSIS — M25561 Pain in right knee: Secondary | ICD-10-CM | POA: Insufficient documentation

## 2023-12-23 DIAGNOSIS — M961 Postlaminectomy syndrome, not elsewhere classified: Secondary | ICD-10-CM | POA: Diagnosis not present

## 2023-12-23 DIAGNOSIS — G5793 Unspecified mononeuropathy of bilateral lower limbs: Secondary | ICD-10-CM | POA: Diagnosis present

## 2023-12-23 DIAGNOSIS — G894 Chronic pain syndrome: Secondary | ICD-10-CM | POA: Diagnosis not present

## 2023-12-23 DIAGNOSIS — M25562 Pain in left knee: Secondary | ICD-10-CM | POA: Diagnosis not present

## 2023-12-23 MED ORDER — OXYCODONE-ACETAMINOPHEN 7.5-325 MG PO TABS: 1.0000 | ORAL_TABLET | Freq: Four times a day (QID) | ORAL | 0 refills | Status: DC | PRN

## 2023-12-23 NOTE — Telephone Encounter (Signed)
 Per Amy, provider response forms she does not fill out.

## 2023-12-24 ENCOUNTER — Telehealth: Payer: Self-pay | Admitting: Registered Nurse

## 2023-12-24 MED ORDER — OXYCODONE-ACETAMINOPHEN 7.5-325 MG PO TABS: 1.0000 | ORAL_TABLET | Freq: Four times a day (QID) | ORAL | 0 refills | Status: DC | PRN

## 2023-12-24 NOTE — Addendum Note (Signed)
 Addended by: Jones Bales on: 12/24/2023 12:04 PM   Modules accepted: Orders

## 2023-12-24 NOTE — Telephone Encounter (Signed)
 Patient called in and would like oxycodone sent to [facility] on [address] instead of [facility], requesting a call once sent

## 2023-12-24 NOTE — Telephone Encounter (Signed)
 PMP was Reviewed.  Oxycodone e-scribed to  [facility] pharmacy  per patient request. Jordan Hawks was sent a Fax to remove prescription from her profile.  Ms. Schlosser is aware of the above.

## 2023-12-27 ENCOUNTER — Encounter: Payer: No Typology Code available for payment source | Admitting: Family

## 2023-12-27 ENCOUNTER — Other Ambulatory Visit: Payer: Self-pay | Admitting: Family

## 2023-12-27 DIAGNOSIS — I10 Essential (primary) hypertension: Secondary | ICD-10-CM

## 2023-12-27 DIAGNOSIS — E876 Hypokalemia: Secondary | ICD-10-CM

## 2023-12-27 DIAGNOSIS — E1165 Type 2 diabetes mellitus with hyperglycemia: Secondary | ICD-10-CM

## 2023-12-27 LAB — POCT GLYCOSYLATED HEMOGLOBIN (HGB A1C): HbA1c, POC (controlled diabetic range): 5.9 % (ref 0.0–7.0)

## 2023-12-27 MED ORDER — DEXCOM G6 RECEIVER DEVI: 1.0000 | Freq: Three times a day (TID) | 2 refills | Status: AC

## 2023-12-27 MED ORDER — DEXCOM G6 SENSOR MISC: 1.0000 | Freq: Three times a day (TID) | 2 refills | Status: AC

## 2023-12-27 MED ORDER — POTASSIUM CHLORIDE ER 10 MEQ PO TBCR: 10.0000 meq | EXTENDED_RELEASE_TABLET | Freq: Every day | ORAL | 0 refills | Status: DC

## 2023-12-27 MED ORDER — DEXCOM G6 TRANSMITTER MISC: 1.0000 | Freq: Three times a day (TID) | 2 refills | Status: AC

## 2023-12-27 NOTE — Progress Notes (Addendum)
 Patient ID: Hayley Padilla, female    DOB: 09-18-1951  MRN: [id number]  CC: Chronic Conditions Follow-Up  Subjective: Hayley Padilla is a 73 y.o. female who presents for chronic conditions follow-up.   Her concerns today include:  - Doing well on Amlodipine , Losartan , and Clonidine , no issues/concerns. She does not complain of red flag symptoms such as but not limited to chest pain, shortness of breath, worst headache of life, nausea/vomiting.  - Doing well on Metformin  XR, no issues/concerns. Denies red flag symptoms associated with diabetes. Would like to try Dexcom and unsure if her health insurance covers cost. - Frequent bacterial vaginitis and candida vaginitis. Doing well on present management. Declines referral to Gynecology.  - Needs refills of Potassium Chloride , no issues/concerns.   Patient Active Problem List   Diagnosis Date Noted   Diabetes mellitus, type 2 (HCC) 06/24/2023   Vertebral osteomyelitis (HCC) 03/12/2022   Hypokalemia 03/12/2022   Edema of right lower extremity 01/15/2022   Essential hypertension 01/14/2022   Chronic pain 01/14/2022   HLD (hyperlipidemia) 01/14/2022   Discitis 01/14/2022   Obesity (BMI 30-39.9) 01/13/2022   S/P lumbar spinal fusion 01/13/2022   Discitis of lumbar region 01/13/2022   Kyphosis 01/13/2022   Degenerative spondylolisthesis 06/12/2019     Current Outpatient Medications on File Prior to Visit  Medication Sig Dispense Refill   acetaminophen  (TYLENOL ) 650 MG CR tablet Take 650 mg by mouth every 8 (eight) hours as needed for pain.     amLODipine  (NORVASC ) 10 MG tablet Take 1 tablet (10 mg total) by mouth daily. 90 tablet 0   Cholecalciferol  (VITAMIN D ) 125 MCG (5000 UT) CAPS      cloNIDine  (CATAPRES ) 0.1 MG tablet Take 1 tablet (0.1 mg total) by mouth 2 (two) times daily. 180 tablet 0   clotrimazole  (GYNE-LOTRIMIN ) 1 % vaginal cream Place 1 Applicatorful vaginally at bedtime for 7 days. 45 g 0   furosemide  (LASIX ) 40 MG tablet  Take 40 mg by mouth daily.     gabapentin  (NEURONTIN ) 300 MG capsule Take 1 capsule (300 mg total) by mouth 3 (three) times daily. 270 capsule 0   Iron , Ferrous Sulfate , 325 (65 Fe) MG TABS Take 325 mg by mouth daily. 90 tablet 0   losartan  (COZAAR ) 100 MG tablet Take 1 tablet (100 mg total) by mouth daily. 90 tablet 0   lovastatin  (MEVACOR ) 40 MG tablet Take 1 tablet (40 mg total) by mouth every evening. Hold while on Daptomycin  - can resume once course completed. 90 tablet 0   metFORMIN  (GLUCOPHAGE -XR) 500 MG 24 hr tablet Take 1 tablet (500 mg total) by mouth daily with breakfast. 90 tablet 0   metroNIDAZOLE  (FLAGYL ) 500 MG tablet Take 1 tablet (500 mg total) by mouth 2 (two) times daily for 7 days. 14 tablet 0   oxyCODONE -acetaminophen  (PERCOCET) 7.5-325 MG tablet Take 1 tablet by mouth 4 (four) times daily as needed. 120 tablet 0   polyethylene glycol (MIRALAX  / GLYCOLAX ) 17 g packet Take 17 g by mouth daily as needed. 90 each 0   Vitamin D , Ergocalciferol , (DRISDOL ) 1.25 MG (50000 UNIT) CAPS capsule Take 50,000 Units by mouth every Monday.     senna-docusate (SENOKOT-S) 8.6-50 MG tablet Take 1 tablet by mouth at bedtime.     No current facility-administered medications on file prior to visit.    Allergies  Allergen Reactions   Penicillins Anaphylaxis    Did it involve swelling of the face/tongue/throat, SOB, or low BP? Yes  Did it involve sudden or severe rash/hives, skin peeling, or any reaction on the inside of your mouth or nose? No Did you need to seek medical attention at a hospital or doctor's office? Yes When did it last happen?      Been a While  If all above answers are "NO", may proceed with cephalosporin use.    Aspirin Other (See Comments)    brusing _ patient requested to be listed    Social History   Socioeconomic History   Marital status: Widowed    Spouse name: Not on file   Number of children: Not on file   Years of education: Not on file   Highest education  level: Not on file  Occupational History   Not on file  Tobacco Use   Smoking status: Never   Smokeless tobacco: Current    Types: Chew   Tobacco comments:    last used 03/17/2020 @ 2200  Vaping Use   Vaping status: Never Used  Substance and Sexual Activity   Alcohol use: Not Currently   Drug use: Never   Sexual activity: Not on file  Other Topics Concern   Not on file  Social History Narrative   Not on file   Social Drivers of Health   Financial Resource Strain: Not on file  Food Insecurity: Not on file  Transportation Needs: Not on file  Physical Activity: Not on file  Stress: Not on file  Social Connections: Not on file  Intimate Partner Violence: Not on file    Family History  Problem Relation Age of Onset   Colon cancer Neg Hx    Colon polyps Neg Hx    Esophageal cancer Neg Hx    Rectal cancer Neg Hx    Stomach cancer Neg Hx     Past Surgical History:  Procedure Laterality Date   BACK SURGERY     lower back   IR LUMBAR DISC ASPIRATION W/IMG GUIDE  01/15/2022   LAMINECTOMY WITH POSTERIOR LATERAL ARTHRODESIS LEVEL 1 N/A 03/16/2022   Procedure: Lumbar One-Two Posterior Lateral Fusion with pedicle screws;  Surgeon: Louis Shove, MD;  Location: [facility] OR;  Service: Neurosurgery;  Laterality: N/A;   MULTIPLE TOOTH EXTRACTIONS      ROS: Review of Systems Negative except as stated above  PHYSICAL EXAM: BP 101/65   Pulse 69   Temp (!) 97.4 F (36.3 C) (Oral)   Ht 5' 2 (1.575 m)   Wt 209 lb 3.2 oz (94.9 kg)   SpO2 95%   BMI 38.26 kg/m   Physical Exam HENT:     Head: Normocephalic and atraumatic.     Nose: Nose normal.     Mouth/Throat:     Mouth: Mucous membranes are moist.     Pharynx: Oropharynx is clear.  Eyes:     Extraocular Movements: Extraocular movements intact.     Conjunctiva/sclera: Conjunctivae normal.     Pupils: Pupils are equal, round, and reactive to light.  Cardiovascular:     Rate and Rhythm: Normal rate and regular rhythm.     Pulses:  Normal pulses.     Heart sounds: Normal heart sounds.  Pulmonary:     Effort: Pulmonary effort is normal.     Breath sounds: Normal breath sounds.  Musculoskeletal:        General: Normal range of motion.     Cervical back: Normal range of motion and neck supple.  Neurological:     General: No focal deficit present.  Mental Status: She is alert and oriented to person, place, and time.  Psychiatric:        Mood and Affect: Mood normal.        Behavior: Behavior normal.     ASSESSMENT AND PLAN: Primary hypertension (Primary) - Continue Amlodipine , Losartan , and Clonidine  as prescribed. No refills needed as of present.  - Counseled on blood pressure goal of less than 140/90, low-sodium, DASH diet, medication compliance, 150 minutes of moderate intensity exercise per week as tolerated. Discussed medication compliance, adverse effects. - Follow-up with primary provider in 3 months or sooner if needed.   Type 2 diabetes mellitus with hyperglycemia, without long-term current use of insulin  - Continue Metformin  XR as prescribed. Counseled on medication adherence/adverse effects. No refills needed as of present.  - Hemoglobin A1c result pending.  - Dexcom 6 diabetic testing supplies ordered per patient request.  - Discussed the importance of healthy eating habits, low-carbohydrate diet, low-sugar diet, regular aerobic exercise (at least 150 minutes a week as tolerated) and medication compliance to achieve or maintain control of diabetes. - Follow-up with primary provider as scheduled.   Hypokalemia  - Continue Potassium-Chloride as prescribed. Counseled on medication adherence/adverse effects.  - Routine screening.  - Follow-up with primary provider as scheduled.   Patient was given the opportunity to ask questions.  Patient verbalized understanding of the plan and was able to repeat key elements of the plan. Patient was given clear instructions to go to Emergency Department or return  to medical center if symptoms don't improve, worsen, or new problems develop.The patient verbalized understanding.  Return in about 3 months (around 03/26/2024) for Follow-Up or next available chronic conditions.  Greig JINNY Drones, NP

## 2023-12-27 NOTE — Progress Notes (Addendum)
 Patient states this is a follow up from January 3rd not a F/U   Patient states metronidazole gives her no energy.  Asking for you to take over calcium medication so she doesn't have to run around from place to place to get meds.

## 2023-12-28 ENCOUNTER — Other Ambulatory Visit: Payer: Self-pay | Admitting: Family

## 2023-12-28 DIAGNOSIS — E876 Hypokalemia: Secondary | ICD-10-CM | POA: Diagnosis not present

## 2023-12-29 ENCOUNTER — Telehealth: Payer: Self-pay

## 2023-12-29 LAB — SPECIMEN STATUS REPORT

## 2023-12-29 LAB — POTASSIUM: Potassium: 3.8 mmol/L (ref 3.5–5.2)

## 2023-12-29 NOTE — Telephone Encounter (Signed)
 Also,  Dexcom G6 Transmitter  Thank you

## 2023-12-29 NOTE — Telephone Encounter (Signed)
 Last one for this patient.  Dexcom G6 Receiver

## 2023-12-29 NOTE — Telephone Encounter (Signed)
 Good afternoon Loetta Ringer, can you help me with this prior auth please.  Dexcom G6 Sensor (3 pack)  Thank you

## 2023-12-30 ENCOUNTER — Other Ambulatory Visit: Payer: Self-pay | Admitting: Family

## 2023-12-30 ENCOUNTER — Other Ambulatory Visit: Payer: Self-pay

## 2023-12-30 DIAGNOSIS — E1165 Type 2 diabetes mellitus with hyperglycemia: Secondary | ICD-10-CM

## 2023-12-30 LAB — TOXASSURE SELECT,+ANTIDEPR,UR

## 2023-12-30 MED ORDER — FREESTYLE LIBRE 2 READER DEVI: 1.0000 | Freq: Three times a day (TID) | 2 refills | Status: DC

## 2023-12-30 MED ORDER — FREESTYLE LIBRE 2 PLUS SENSOR MISC: 1.0000 | Freq: Three times a day (TID) | 2 refills | Status: DC

## 2023-12-30 NOTE — Telephone Encounter (Signed)
Complete

## 2024-01-06 ENCOUNTER — Ambulatory Visit: Payer: No Typology Code available for payment source | Admitting: Podiatry

## 2024-01-13 ENCOUNTER — Telehealth: Payer: Self-pay | Admitting: Family

## 2024-01-13 NOTE — Telephone Encounter (Signed)
Left voicemail to call office back to go over which medications. These forms Amy doesn't fill out.

## 2024-01-13 NOTE — Telephone Encounter (Signed)
A document form has been faxed:  Prescriber Response Form , to be filled out by provider. Send document back via Fax within 5-days. Document is located in providers tray at front office.           Fax number:  [phone]

## 2024-01-14 ENCOUNTER — Ambulatory Visit: Payer: No Typology Code available for payment source | Admitting: Family

## 2024-01-17 ENCOUNTER — Encounter: Payer: Self-pay | Admitting: Registered Nurse

## 2024-01-17 ENCOUNTER — Encounter: Payer: No Typology Code available for payment source | Attending: Registered Nurse | Admitting: Registered Nurse

## 2024-01-17 VITALS — BP 110/63 | HR 78 | Ht 62.0 in | Wt 208.0 lb

## 2024-01-17 DIAGNOSIS — M1711 Unilateral primary osteoarthritis, right knee: Secondary | ICD-10-CM | POA: Insufficient documentation

## 2024-01-17 DIAGNOSIS — Z79891 Long term (current) use of opiate analgesic: Secondary | ICD-10-CM | POA: Insufficient documentation

## 2024-01-17 DIAGNOSIS — M170 Bilateral primary osteoarthritis of knee: Secondary | ICD-10-CM

## 2024-01-17 DIAGNOSIS — G5793 Unspecified mononeuropathy of bilateral lower limbs: Secondary | ICD-10-CM | POA: Diagnosis present

## 2024-01-17 DIAGNOSIS — M25561 Pain in right knee: Secondary | ICD-10-CM | POA: Diagnosis not present

## 2024-01-17 DIAGNOSIS — M1712 Unilateral primary osteoarthritis, left knee: Secondary | ICD-10-CM | POA: Insufficient documentation

## 2024-01-17 DIAGNOSIS — M25512 Pain in left shoulder: Secondary | ICD-10-CM | POA: Insufficient documentation

## 2024-01-17 DIAGNOSIS — M25562 Pain in left knee: Secondary | ICD-10-CM | POA: Diagnosis not present

## 2024-01-17 DIAGNOSIS — M5416 Radiculopathy, lumbar region: Secondary | ICD-10-CM | POA: Insufficient documentation

## 2024-01-17 DIAGNOSIS — M961 Postlaminectomy syndrome, not elsewhere classified: Secondary | ICD-10-CM | POA: Insufficient documentation

## 2024-01-17 DIAGNOSIS — Z5181 Encounter for therapeutic drug level monitoring: Secondary | ICD-10-CM | POA: Diagnosis not present

## 2024-01-17 DIAGNOSIS — M25551 Pain in right hip: Secondary | ICD-10-CM | POA: Diagnosis not present

## 2024-01-17 DIAGNOSIS — Z760 Encounter for issue of repeat prescription: Secondary | ICD-10-CM | POA: Insufficient documentation

## 2024-01-17 DIAGNOSIS — G894 Chronic pain syndrome: Secondary | ICD-10-CM | POA: Diagnosis not present

## 2024-01-17 DIAGNOSIS — M25552 Pain in left hip: Secondary | ICD-10-CM | POA: Diagnosis not present

## 2024-01-17 MED ORDER — OXYCODONE-ACETAMINOPHEN 7.5-325 MG PO TABS: 1.0000 | ORAL_TABLET | Freq: Four times a day (QID) | ORAL | 0 refills | Status: DC | PRN

## 2024-01-17 NOTE — Progress Notes (Signed)
Subjective:    Patient ID: Hayley Padilla, female    DOB: 11-11-1951, 73 y.o.   MRN: [id number]  HPI: Hayley ANDRINGA is a 73 y.o. female who returns for follow up appointment for chronic pain and medication refill. She states her pain is located in her lower back radiating into her buttocks and bilateral hips. She also reports bilateral knee pain and bilateral feet pain tingling and burning in her bilateral feet. She rates her pain 9. Her current exercise regime is walking with her walker, using her peddler daily for 30 minutes and performing stretching exercises.  Hayley Padilla Morphine equivalent is 45.00 MME.   Last UDS was Performed on 12/23/2023, it was consistent.      Pain Inventory Average Pain 9 Pain Right Now 9 My pain is intermittent and aching  In the last 24 hours, has pain interfered with the following? General activity 10 Relation with others 9 Enjoyment of life 10 What TIME of day is your pain at its worst? morning , daytime, evening, and night Sleep (in general) Fair  Pain is worse with: walking, bending, standing, and some activites Pain improves with: rest, medication, and heat Relief from Meds: 8  Family History  Problem Relation Age of Onset   Colon cancer Neg Hx    Colon polyps Neg Hx    Esophageal cancer Neg Hx    Rectal cancer Neg Hx    Stomach cancer Neg Hx    Social History   Socioeconomic History   Marital status: Widowed    Spouse name: Not on file   Number of children: Not on file   Years of education: Not on file   Highest education level: Not on file  Occupational History   Not on file  Tobacco Use   Smoking status: Never   Smokeless tobacco: Current    Types: Chew   Tobacco comments:    last used 03/17/2020 @ 2200  Vaping Use   Vaping status: Never Used  Substance and Sexual Activity   Alcohol use: Not Currently   Drug use: Never   Sexual activity: Not on file  Other Topics Concern   Not on file  Social History Narrative   Not on  file   Social Drivers of Health   Financial Resource Strain: Not on file  Food Insecurity: Not on file  Transportation Needs: Not on file  Physical Activity: Not on file  Stress: Not on file  Social Connections: Not on file   Past Surgical History:  Procedure Laterality Date   BACK SURGERY     lower back   IR LUMBAR DISC ASPIRATION W/IMG GUIDE  01/15/2022   LAMINECTOMY WITH POSTERIOR LATERAL ARTHRODESIS LEVEL 1 N/A 03/16/2022   Procedure: Lumbar One-Two Posterior Lateral Fusion with pedicle screws;  Surgeon: Julio Sicks, MD;  Location: [facility] OR;  Service: Neurosurgery;  Laterality: N/A;   MULTIPLE TOOTH EXTRACTIONS     Past Surgical History:  Procedure Laterality Date   BACK SURGERY     lower back   IR LUMBAR DISC ASPIRATION W/IMG GUIDE  01/15/2022   LAMINECTOMY WITH POSTERIOR LATERAL ARTHRODESIS LEVEL 1 N/A 03/16/2022   Procedure: Lumbar One-Two Posterior Lateral Fusion with pedicle screws;  Surgeon: Julio Sicks, MD;  Location: [facility] OR;  Service: Neurosurgery;  Laterality: N/A;   MULTIPLE TOOTH EXTRACTIONS     Past Medical History:  Diagnosis Date   Allergy    Arthritis    Diabetes mellitus without complication (HCC)  Hyperlipidemia    Hypertension    PONV (postoperative nausea and vomiting)    BP 110/63   Pulse 78   Ht 5\' 2"  (1.575 m)   Wt 208 lb (94.3 kg)   SpO2 100%   BMI 38.04 kg/m   Opioid Risk Score:   Fall Risk Score:  `1  Depression screen PHQ 2/9     01/17/2024    1:13 PM 12/23/2023   10:03 AM 10/20/2023    1:07 PM 10/18/2023    2:58 PM 09/24/2023    1:44 PM 08/20/2023    2:51 PM 07/22/2023    2:14 PM  Depression screen PHQ 2/9  Decreased Interest 0 0 0 0 0 0 0  Down, Depressed, Hopeless 0 0 0  0 0 0  PHQ - 2 Score 0 0 0 0 0 0 0    Review of Systems  Musculoskeletal:  Positive for back pain and gait problem.       Pain in both feet  All other systems reviewed and are negative.      Objective:   Physical Exam Vitals and nursing note reviewed.   Constitutional:      Appearance: Normal appearance.  Cardiovascular:     Rate and Rhythm: Normal rate and regular rhythm.     Pulses: Normal pulses.     Heart sounds: Normal heart sounds.  Pulmonary:     Effort: Pulmonary effort is normal.     Breath sounds: Normal breath sounds.  Musculoskeletal:     Comments: Normal Muscle Bulk and Muscle Testing Reveals:  Upper Extremities: Decreased ROM 90 Degrees  and Muscle Strength  5/5 Bilateral Greater Trochanter Tenderness Lower Extremities: Decreased ROM and Muscle Strength 5/5 Bilateral Lower Extremities Flexion Produces Pain into her Bilateral Patella's Arises from Chair slowly using walker for support Antalgic  Gait     Skin:    General: Skin is warm and dry.  Neurological:     Mental Status: She is alert and oriented to person, place, and time.  Psychiatric:        Mood and Affect: Mood normal.        Behavior: Behavior normal.         Assessment & Plan:  Lumbar Post Laminectomy Syndrome: Hayley Padilla underwent on 03/17/2023 : Dr Jordan Likes : Lumbar One-Two Posterior Lateral Fusion with pedicle screws  Continue HEP as Tolerated. Continue to Monitor. 01/17/2024 2. Lumbar Radiculitis: Continue Gabapentin. Continue to monitor. 01/17/2024 3. Chronic Bilateral Hip Pain: Continue HEP as Tolerated. Continue to Monitor. 01/17/2024 4. Chronic Pain of Bilateral Knees: R>LContinue HEP as Tolerated. Continue to Monitor. 01/17/2024 5. Chronic Pain Syndrome: Refilled: Oxycodone 7.5/325 mg one tablet 4 times a day as needed for pain #120.  We will continue the opioid monitoring program, this consists of regular clinic visits, examinations, urine drug screen, pill counts as well as use of [place] Controlled Substance Reporting system. A 12 month History has been reviewed on the [place] Controlled Substance Reporting System on 01/17/2024. 6, Left Shoulder pain: No complaints today. Continue to monitor.  01/17/2024   F/U in 1 month

## 2024-01-20 ENCOUNTER — Encounter: Payer: Self-pay | Admitting: Podiatry

## 2024-01-20 ENCOUNTER — Ambulatory Visit: Payer: No Typology Code available for payment source

## 2024-01-20 ENCOUNTER — Ambulatory Visit (INDEPENDENT_AMBULATORY_CARE_PROVIDER_SITE_OTHER): Payer: No Typology Code available for payment source | Admitting: Podiatry

## 2024-01-20 DIAGNOSIS — E1165 Type 2 diabetes mellitus with hyperglycemia: Secondary | ICD-10-CM

## 2024-01-20 DIAGNOSIS — M21751 Unequal limb length (acquired), right femur: Secondary | ICD-10-CM

## 2024-01-20 DIAGNOSIS — M2141 Flat foot [pes planus] (acquired), right foot: Secondary | ICD-10-CM | POA: Diagnosis not present

## 2024-01-20 DIAGNOSIS — M19072 Primary osteoarthritis, left ankle and foot: Secondary | ICD-10-CM

## 2024-01-20 DIAGNOSIS — M19071 Primary osteoarthritis, right ankle and foot: Secondary | ICD-10-CM

## 2024-01-20 DIAGNOSIS — M2142 Flat foot [pes planus] (acquired), left foot: Secondary | ICD-10-CM

## 2024-01-20 NOTE — Progress Notes (Signed)
  Subjective:  Patient ID: Hayley Padilla, female    DOB: [birth year],  MRN: [id number]  No chief complaint on file.   73 y.o. female presents with the above complaint. History confirmed with patient.  She returns for follow-up she is having pain in the right side of the leg  Objective:  Physical Exam: warm, good capillary refill, no trophic changes or ulcerative lesions, normal DP and PT pulses, normal sensory exam, and no varicosities noted, she has mild to moderate nonpitting edema around the bilateral ankle and sinus tarsi, some tenderness in the sinus tarsi to palpation.  Good smooth range of motion without pain.  No reproducible pain to palpation along the peroneal tendons the pain she reports is in the area of the common peroneal nerve.  Weakness in lateral and anterior compartment on right lower extremity.  2 cm leg length shorter on the right   Radiographs: Multiple views x-ray of both ankles mild to moderate degenerative changes in the ankle joint Assessment:   1. Arthritis of both ankles   2. Unequal limb length (acquired), right femur   3. Type 2 diabetes mellitus with hyperglycemia, without long-term current use of insulin (HCC)   4. Pes planus of both feet      Plan:  Patient was evaluated and treated and all questions answered.  We again reviewed the radiographs and discussed the presence of the arthritis.  This does not seem to be her primary complaint she does have pain along the lateral leg and I discussed with her she likely is having common peroneal nerve pain and she clearly does have weakness in this nerve distribution she says this is from previous spine surgery.  Unclearly will be much that can be done with this but she will discuss with her pain management specialist.  She is not interested in or has further surgical options for the back.  She does have a knee replacement in the left side and I think she has a 2 cm limb length referencing now with the right leg  shorter than the left.  I do think she would benefit from supportive orthopedic and diabetic shoes and extra-depth shoes with multidensity insoles and we will see if we can add a 2 cm lift to this to alleviate her limb length discrepancy.  She will follow-up with our orthotist for this.  Follow-up with me as needed for further issues.  Return if symptoms worsen or fail to improve.

## 2024-02-07 ENCOUNTER — Ambulatory Visit: Payer: Self-pay | Admitting: Family

## 2024-02-07 NOTE — Telephone Encounter (Signed)
 Copied from CRM [id number]. Topic: Clinical - Red Word Triage >> Feb 07, 2024  4:17 PM Eunice Blase wrote: Red Word that prompted transfer to Nurse Triage: Pt has had congestion, coughing, sinus pain for over a week.   Chief Complaint: Sinus congestion  Symptoms: Sinus congestion, yellow nasal discharge  Frequency: Constant  Pertinent Negatives: Patient denies fever, cough, pain Disposition: [] ED /[] Urgent Care (no appt availability in office) / [] Appointment(In office/virtual)/ []  [place] [facility]/ [x] Home Care/ [] Refused Recommended Disposition /[] [place] [facility]/ []  Follow-up with PCP Additional Notes: Patient reports that for about a week she has been experiencing sinus congestion with intermittent yellow discharge. She denies any fevers or cough. Patient was wondering if something could be called in for her symptoms. I advised the patient I would send the request but could not promise anything would be prescribed. Patient understood and states she will try OTC medication and call back if it does not help to make an appointment.     Reason for Disposition  [1] Sinus congestion as part of a cold AND [2] present < 10 days  Answer Assessment - Initial Assessment Questions 1. LOCATION: "Where does it hurt?"      Frontal congestion  2. ONSET: "When did the sinus pain start?"  (e.g., hours, days)      1 week ago  3. SEVERITY: "How bad is the pain?"   (Scale 1-10; mild, moderate or severe)   - MILD (1-3): doesn't interfere with normal activities    - MODERATE (4-7): interferes with normal activities (e.g., work or school) or awakens from sleep   - SEVERE (8-10): excruciating pain and patient unable to do any normal activities        No pain 4. RECURRENT SYMPTOM: "Have you ever had sinus problems before?" If Yes, ask: "When was the last time?" and "What happened that time?"      No 5. NASAL CONGESTION: "Is the nose blocked?" If Yes, ask: "Can you open it or must you breathe through  your mouth?"     Yes  6. NASAL DISCHARGE: "Do you have discharge from your nose?" If so ask, "What color?"     Yellow or clear  7. FEVER: "Do you have a fever?" If Yes, ask: "What is it, how was it measured, and when did it start?"      No 8. OTHER SYMPTOMS: "Do you have any other symptoms?" (e.g., sore throat, cough, earache, difficulty breathing)      Mild cough  9. PREGNANCY: "Is there any chance you are pregnant?" "When was your last menstrual period?"     No  Protocols used: Sinus Pain or Congestion-A-AH

## 2024-02-08 NOTE — Telephone Encounter (Signed)
 Report to Emergency Department/Urgent Care/call 911 for immediate medical evaluation. Follow-up with Primary Care.

## 2024-02-10 NOTE — Telephone Encounter (Signed)
 Left voicemail to call office back to go over results.

## 2024-02-14 ENCOUNTER — Ambulatory Visit: Payer: Self-pay | Admitting: Family

## 2024-02-14 ENCOUNTER — Encounter: Payer: Self-pay | Admitting: Internal Medicine

## 2024-02-14 ENCOUNTER — Ambulatory Visit (INDEPENDENT_AMBULATORY_CARE_PROVIDER_SITE_OTHER): Admitting: Internal Medicine

## 2024-02-14 VITALS — BP 100/60 | HR 67 | Temp 97.8°F | Ht 62.0 in | Wt 208.0 lb

## 2024-02-14 DIAGNOSIS — J0100 Acute maxillary sinusitis, unspecified: Secondary | ICD-10-CM

## 2024-02-14 MED ORDER — AZITHROMYCIN 250 MG PO TABS: ORAL_TABLET | ORAL | 0 refills | Status: DC

## 2024-02-14 MED ORDER — FLUCONAZOLE 150 MG PO TABS: 150.0000 mg | ORAL_TABLET | Freq: Once | ORAL | 0 refills | Status: AC

## 2024-02-14 MED ORDER — BENZONATATE 100 MG PO CAPS: 100.0000 mg | ORAL_CAPSULE | Freq: Three times a day (TID) | ORAL | 0 refills | Status: AC | PRN

## 2024-02-14 NOTE — Progress Notes (Signed)
 Patient Care Team: Rema Fendt, NP as PCP - General (Nurse Practitioner)  Visit Date: 02/14/24  Subjective:   Chief Complaint  Patient presents with   Sinusitis    Started a week ago.    Nasal Congestion  Patient WU:JWJXB L Barsky,Female DOB:[birth year],72 y.o. JYN:[id number]   73 y.o. Female, ambulating with a rolling walker presents today for acute sick visit with Rhinorrhea and Sinus/Nasal Congestion. Patient has a past medical history of Allergic DM, HLD, & HTN. Says she has tried Sudafed without relief, and has noticed some green/yellow mucus w/ some blood when blowing her nose. Denies hx of sinus issues. She says she was around her daughter, who was sick recently.    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 Had L2-L4 PLIF requiring SNF rehab postop Summer 2020. Had posterior spinal fusion 2011 at the [place] in 2011. Had revision of fusion in 2000  Past Medical History:  Diagnosis Date   Allergy    Arthritis    Diabetes mellitus without complication (HCC)    Hyperlipidemia    Hypertension    PONV (postoperative nausea and vomiting)     Allergies  Allergen Reactions   Penicillins Anaphylaxis    Did it involve swelling of the face/tongue/throat, SOB, or low BP? Yes Did it involve sudden or severe rash/hives, skin peeling, or any reaction on the inside of your mouth or nose? No Did you need to seek medical attention at a hospital or doctor's office? Yes When did it last happen?      "Been a While"  If all above answers are "NO", may proceed with cephalosporin use.    Aspirin Other (See Comments)    brusing _ patient requested to be listed    Family History  Problem Relation Age of Onset   Colon cancer Neg Hx    Colon polyps Neg Hx    Esophageal cancer Neg Hx    Rectal cancer Neg Hx    Stomach cancer Neg Hx    Social Hx: nonsmoker. Does not use alcohol.  Allergy to PCN  Review of Systems  HENT:  Positive for congestion (nasal).    Respiratory:  Positive for cough and sputum production.   All other systems reviewed and are negative.    Objective:  Vitals: BP 100/60   Pulse 67   Temp 97.8 F (36.6 C)   Ht 5\' 2"  (1.575 m)   Wt 208 lb (94.3 kg)   SpO2 97%   BMI 38.04 kg/m   Physical Exam Vitals and nursing note reviewed.  Constitutional:      General: She is not in acute distress.    Appearance: Normal appearance. She is not toxic-appearing.  HENT:     Head: Normocephalic and atraumatic.     Right Ear: Hearing normal.     Left Ear: Hearing normal.     Ears:     Comments: TMs full    Mouth/Throat:     Pharynx: Oropharynx is clear. No oropharyngeal exudate.  Pulmonary:     Effort: Pulmonary effort is normal.  Skin:    General: Skin is warm and dry.  Neurological:     Mental Status: She is alert and oriented to person, place, and time. Mental status is at baseline.  Psychiatric:        Mood and Affect: Mood normal.        Behavior: Behavior normal.        Thought Content: Thought content normal.  Judgment: Judgment normal.     Results:  Studies Obtained And Personally Reviewed By Me: Labs:     Component Value Date/Time   NA 145 (H) 12/17/2023 1457   K 3.8 12/28/2023 0000   CL 105 12/17/2023 1457   CO2 25 12/17/2023 1457   GLUCOSE 109 (H) 12/17/2023 1457   GLUCOSE 106 (H) 03/16/2022 0247   BUN 8 12/17/2023 1457   CREATININE 0.66 12/17/2023 1457   CREATININE 0.63 03/10/2022 1100   CALCIUM 9.1 12/17/2023 1457   PROT 6.4 06/23/2023 1340   ALBUMIN 4.1 06/23/2023 1340   AST 15 06/23/2023 1340   ALT 10 06/23/2023 1340   ALKPHOS 134 (H) 06/23/2023 1340   BILITOT 0.3 06/23/2023 1340   GFRNONAA >60 03/16/2022 0247   GFRNONAA 88 01/21/2021 0000   GFRAA 102 01/21/2021 0000    Lab Results  Component Value Date   WBC 4.9 08/13/2023   HGB 9.9 (L) 08/13/2023   HCT 32.4 (L) 08/13/2023   MCV 75 (L) 08/13/2023   PLT 324 08/13/2023   Lab Results  Component Value Date   CHOL 158  06/23/2023   HDL 44 06/23/2023   LDLCALC 91 06/23/2023   TRIG 129 06/23/2023   CHOLHDL 3.6 06/23/2023   Lab Results  Component Value Date   HGBA1C 5.9 12/27/2023    Lab Results  Component Value Date   TSH 2.080 06/23/2023   Assessment & Plan:   Acute Maxillary Sinusitis: Have prescribed Zithromax Z-pak - take 2 tablets on Day 1 and 1 tablet on Days 2-5, 100 mg Tessalon Perles -  take 1 capsule (100 mg total) by mouth 3 (three) times daily as needed, and 150 mg Diflucan in case you develop Candida vaginitis while on antibiotics. Stay well rested, well hydrated, and well nourished. Walk around some to prevent atelectasis. Contact us if symptoms fail to improve in 3-5 days or sooner if worse.     I,Emily Lagle,acting as a Neurosurgeon for Margaree Mackintosh, MD.,have documented all relevant documentation on the behalf of Margaree Mackintosh, MD,as directed by  Margaree Mackintosh, MD while in the presence of Margaree Mackintosh, MD.   I, Margaree Mackintosh, MD, have reviewed all documentation for this visit. The documentation on 02/16/24 for the exam, diagnosis, procedures, and orders are all accurate and complete.

## 2024-02-14 NOTE — Progress Notes (Deleted)
 Subjective:    Patient ID: Hayley Padilla, female    DOB: 03-31-1951, 73 y.o.   MRN: [id number]  HPI  Pain Inventory Average Pain {NUMBERS; 0-10:5044} Pain Right Now {NUMBERS; 0-10:5044} My pain is {PAIN DESCRIPTION:21022940}  In the last 24 hours, has pain interfered with the following? General activity {NUMBERS; 0-10:5044} Relation with others {NUMBERS; 0-10:5044} Enjoyment of life {NUMBERS; 0-10:5044} What TIME of day is your pain at its worst? {time of day:24191} Sleep (in general) {BHH GOOD/FAIR/POOR:22877}  Pain is worse with: {ACTIVITIES:21022942} Pain improves with: {PAIN IMPROVES WUJW:11914782} Relief from Meds: {NUMBERS; 0-10:5044}  Family History  Problem Relation Age of Onset   Colon cancer Neg Hx    Colon polyps Neg Hx    Esophageal cancer Neg Hx    Rectal cancer Neg Hx    Stomach cancer Neg Hx    Social History   Socioeconomic History   Marital status: Widowed    Spouse name: Not on file   Number of children: Not on file   Years of education: Not on file   Highest education level: Not on file  Occupational History   Not on file  Tobacco Use   Smoking status: Never   Smokeless tobacco: Current    Types: Chew   Tobacco comments:    last used 03/17/2020 @ 2200  Vaping Use   Vaping status: Never Used  Substance and Sexual Activity   Alcohol use: Not Currently   Drug use: Never   Sexual activity: Not on file  Other Topics Concern   Not on file  Social History Narrative   Not on file   Social Drivers of Health   Financial Resource Strain: Not on file  Food Insecurity: Not on file  Transportation Needs: Not on file  Physical Activity: Not on file  Stress: Not on file  Social Connections: Not on file   Past Surgical History:  Procedure Laterality Date   BACK SURGERY     lower back   IR LUMBAR DISC ASPIRATION W/IMG GUIDE  01/15/2022   LAMINECTOMY WITH POSTERIOR LATERAL ARTHRODESIS LEVEL 1 N/A 03/16/2022   Procedure: Lumbar One-Two Posterior  Lateral Fusion with pedicle screws;  Surgeon: Julio Sicks, MD;  Location: [facility] OR;  Service: Neurosurgery;  Laterality: N/A;   MULTIPLE TOOTH EXTRACTIONS     Past Surgical History:  Procedure Laterality Date   BACK SURGERY     lower back   IR LUMBAR DISC ASPIRATION W/IMG GUIDE  01/15/2022   LAMINECTOMY WITH POSTERIOR LATERAL ARTHRODESIS LEVEL 1 N/A 03/16/2022   Procedure: Lumbar One-Two Posterior Lateral Fusion with pedicle screws;  Surgeon: Julio Sicks, MD;  Location: [facility] OR;  Service: Neurosurgery;  Laterality: N/A;   MULTIPLE TOOTH EXTRACTIONS     Past Medical History:  Diagnosis Date   Allergy    Arthritis    Diabetes mellitus without complication (HCC)    Hyperlipidemia    Hypertension    PONV (postoperative nausea and vomiting)    There were no vitals taken for this visit.  Opioid Risk Score:   Fall Risk Score:  `1  Depression screen PHQ 2/9     01/17/2024    1:13 PM 12/23/2023   10:03 AM 10/20/2023    1:07 PM 10/18/2023    2:58 PM 09/24/2023    1:44 PM 08/20/2023    2:51 PM 07/22/2023    2:14 PM  Depression screen PHQ 2/9  Decreased Interest 0 0 0 0 0 0 0  Down, Depressed, Hopeless 0  0 0  0 0 0  PHQ - 2 Score 0 0 0 0 0 0 0     Review of Systems     Objective:   Physical Exam        Assessment & Plan:

## 2024-02-14 NOTE — Telephone Encounter (Signed)
 I called patient. [facility]  [facility]

## 2024-02-14 NOTE — Telephone Encounter (Signed)
 Chief Complaint: Cough Symptoms: runny nose, sinus pressure Frequency: 7-10 days ago Pertinent Negatives: Patient denies fever, ST, SOB Disposition: [] ED /[] Urgent Care (no appt availability in office) / [x] Appointment(In office/virtual)/ []  [place] [facility]/ [] Home Care/ [] Refused Recommended Disposition /[] [place] [facility]/ []  Follow-up with PCP Additional Notes: Pt reports she has been experiencing non-productive cough, runny nose and sinus pressure for a little over a week. Pt denies fever, SOB. Advised she has tried Sudafed with little to no relief. OV scheduled today. This RN educated pt on home care, new-worsening symptoms, when to call back/seek emergent care. Pt verbalized understanding and agrees to plan.    Copied from CRM [id number]. Topic: Clinical - Medical Advice >> Feb 14, 2024  9:59 AM Alessandra Bevels wrote: Reason for CRM: Patient is calling to report sinus pressure and cough since last week. Patient spoke to NT last week. No available appts with Amy for 3 weeks. Next available appointment with [facility] is Next Monday. Patient is asking to be seen today. Please advise Reason for Disposition  SEVERE coughing spells (e.g., whooping sound after coughing, vomiting after coughing)  Answer Assessment - Initial Assessment Questions 1. ONSET: "When did the cough begin?"      Over 1 week ago 2. SEVERITY: "How bad is the cough today?"      Comes and goes 3. SPUTUM: "Describe the color of your sputum" (none, dry cough; clear, white, yellow, green)     None 4. HEMOPTYSIS: "Are you coughing up any blood?" If so ask: "How much?" (flecks, streaks, tablespoons, etc.)     None 5. DIFFICULTY BREATHING: "Are you having difficulty breathing?" If Yes, ask: "How bad is it?" (e.g., mild, moderate, severe)    - MILD: No SOB at rest, mild SOB with walking, speaks normally in sentences, can lie down, no retractions, pulse < 100.    - MODERATE: SOB at rest, SOB with minimal exertion and prefers to  sit, cannot lie down flat, speaks in phrases, mild retractions, audible wheezing, pulse 100-120.    - SEVERE: Very SOB at rest, speaks in single words, struggling to breathe, sitting hunched forward, retractions, pulse > 120      None 6. FEVER: "Do you have a fever?" If Yes, ask: "What is your temperature, how was it measured, and when did it start?"     None  10. OTHER SYMPTOMS: "Do you have any other symptoms?" (e.g., runny nose, wheezing, chest pain)       Sinus pressure, runny nose  Protocols used: Cough - Acute Non-Productive-A-AH

## 2024-02-15 ENCOUNTER — Encounter: Payer: No Typology Code available for payment source | Admitting: Registered Nurse

## 2024-02-16 ENCOUNTER — Encounter: Payer: Self-pay | Admitting: Internal Medicine

## 2024-02-16 NOTE — Patient Instructions (Addendum)
 We are sorry you are not feeling well.  Please take Zithromax Z-PAK 2 tabs day 1 followed by 1 tab days 2 through 5.  You may also take Tessalon Perles 100 mg 3 times daily if needed for cough.  If you developed a yeast infection in your vaginal area while taking antibiotics you may take Diflucan 150 mg tablet one-time dose.  Contact us if you are not better in 3 to 5 days or sooner if worse.

## 2024-02-16 NOTE — Progress Notes (Signed)
 Subjective:    Patient ID: Hayley Padilla, female    DOB: 1951/04/27, 73 y.o.   MRN: [id number]  HPI: Hayley Padilla is a 73 y.o. female who  is scheduled for Virtual visit, she was diagnosed with Bronchitis. Hayley Padilla was unable to access her My-chart, her visit was changed to telephone visit. I connected with Clarnce Flock  by telephone and verified that I am speaking with the correct person using two identifiers.  Location: Patient: In her Home Provider: In the Office   I discussed the limitations, risks, security and privacy concerns of performing an evaluation and management service by telephone and the availability of in person appointments. I also discussed with the patient that there may be a patient responsible charge related to this service. The patient expressed understanding and agreed to proceed.  She states her pain is located in her lower back radiating into her buttock, bilateral knee pain and bilateral feet with tingling. She  rates her pain 8 Her current exercise regime is walking with walker.   Hayley Padilla Morphine equivalent is 45.00 MME.   Last UDS was Performed on 12/23/2023, it was consistent.     Pain Inventory Average Pain 8 Pain Right Now 8 My pain is intermittent and dull  In the last 24 hours, has pain interfered with the following? General activity 7 Relation with others 0 Enjoyment of life 10 What TIME of day is your pain at its worst? daytime Sleep (in general) Fair  Pain is worse with: walking Pain improves with: rest and medication Relief from Meds: 9  Family History  Problem Relation Age of Onset   Colon cancer Neg Hx    Colon polyps Neg Hx    Esophageal cancer Neg Hx    Rectal cancer Neg Hx    Stomach cancer Neg Hx    Social History   Socioeconomic History   Marital status: Widowed    Spouse name: Not on file   Number of children: Not on file   Years of education: Not on file   Highest education level: Not on file  Occupational  History   Not on file  Tobacco Use   Smoking status: Never   Smokeless tobacco: Current    Types: Chew   Tobacco comments:    last used 03/17/2020 @ 2200  Vaping Use   Vaping status: Never Used  Substance and Sexual Activity   Alcohol use: Not Currently   Drug use: Never   Sexual activity: Not on file  Other Topics Concern   Not on file  Social History Narrative   Not on file   Social Drivers of Health   Financial Resource Strain: Not on file  Food Insecurity: Not on file  Transportation Needs: Not on file  Physical Activity: Not on file  Stress: Not on file  Social Connections: Not on file   Past Surgical History:  Procedure Laterality Date   BACK SURGERY     lower back   IR LUMBAR DISC ASPIRATION W/IMG GUIDE  01/15/2022   LAMINECTOMY WITH POSTERIOR LATERAL ARTHRODESIS LEVEL 1 N/A 03/16/2022   Procedure: Lumbar One-Two Posterior Lateral Fusion with pedicle screws;  Surgeon: Julio Sicks, MD;  Location: [facility] OR;  Service: Neurosurgery;  Laterality: N/A;   MULTIPLE TOOTH EXTRACTIONS     Past Surgical History:  Procedure Laterality Date   BACK SURGERY     lower back   IR LUMBAR DISC ASPIRATION W/IMG GUIDE  01/15/2022   LAMINECTOMY  WITH POSTERIOR LATERAL ARTHRODESIS LEVEL 1 N/A 03/16/2022   Procedure: Lumbar One-Two Posterior Lateral Fusion with pedicle screws;  Surgeon: Julio Sicks, MD;  Location: [facility] OR;  Service: Neurosurgery;  Laterality: N/A;   MULTIPLE TOOTH EXTRACTIONS     Past Medical History:  Diagnosis Date   Allergy    Arthritis    Diabetes mellitus without complication (HCC)    Hyperlipidemia    Hypertension    PONV (postoperative nausea and vomiting)    There were no vitals taken for this visit.  Opioid Risk Score:   Fall Risk Score:  `1  Depression screen PHQ 2/9     01/17/2024    1:13 PM 12/23/2023   10:03 AM 10/20/2023    1:07 PM 10/18/2023    2:58 PM 09/24/2023    1:44 PM 08/20/2023    2:51 PM 07/22/2023    2:14 PM  Depression screen PHQ 2/9  Decreased  Interest 0 0 0 0 0 0 0  Down, Depressed, Hopeless 0 0 0  0 0 0  PHQ - 2 Score 0 0 0 0 0 0 0     Review of Systems  Musculoskeletal:  Positive for back pain.       Lower buttocks  All other systems reviewed and are negative.     Objective:   Physical Exam Vitals and nursing note reviewed.  Musculoskeletal:     Comments: No Physical Exam Performed : Telephone visit.          Assessment & Plan:  Lumbar Post Laminectomy Syndrome: Hayley Padilla underwent on 03/17/2023 : Dr Jordan Likes : Lumbar One-Two Posterior Lateral Fusion with pedicle screws  Continue HEP as Tolerated. Continue to Monitor. 02/17/2024 2. Lumbar Radiculitis: Continue Gabapentin. Continue to monitor. 02/17/2024 3. Chronic Bilateral Hip Pain: Continue HEP as Tolerated. Continue to Monitor. 02/17/2024 4. Chronic Pain of Bilateral Knees: R>LContinue HEP as Tolerated. Continue to Monitor. 02/17/2024 5. Chronic Pain Syndrome: Refilled: Oxycodone 7.5/325 mg one tablet 4 times a day as needed for pain #120.  We will continue the opioid monitoring program, this consists of regular clinic visits, examinations, urine drug screen, pill counts as well as use of [place] Controlled Substance Reporting system. A 12 month History has been reviewed on the [place] Controlled Substance Reporting System on 02/17/2024. 6, Left Shoulder pain: No complaints today. Continue to monitor.  02/17/2024  Telephone Visit Establish Patient Location of Patient: In Her Home Location of Provider: In the Office    F/U in 1 month

## 2024-02-17 ENCOUNTER — Encounter: Attending: Registered Nurse | Admitting: Registered Nurse

## 2024-02-17 ENCOUNTER — Encounter: Payer: Self-pay | Admitting: Registered Nurse

## 2024-02-17 VITALS — Ht 62.0 in | Wt 209.0 lb

## 2024-02-17 DIAGNOSIS — M1712 Unilateral primary osteoarthritis, left knee: Secondary | ICD-10-CM | POA: Insufficient documentation

## 2024-02-17 DIAGNOSIS — G894 Chronic pain syndrome: Secondary | ICD-10-CM | POA: Diagnosis not present

## 2024-02-17 DIAGNOSIS — M5416 Radiculopathy, lumbar region: Secondary | ICD-10-CM | POA: Diagnosis not present

## 2024-02-17 DIAGNOSIS — M1711 Unilateral primary osteoarthritis, right knee: Secondary | ICD-10-CM | POA: Diagnosis not present

## 2024-02-17 DIAGNOSIS — Z5181 Encounter for therapeutic drug level monitoring: Secondary | ICD-10-CM | POA: Diagnosis not present

## 2024-02-17 DIAGNOSIS — M170 Bilateral primary osteoarthritis of knee: Secondary | ICD-10-CM | POA: Diagnosis not present

## 2024-02-17 DIAGNOSIS — Z79891 Long term (current) use of opiate analgesic: Secondary | ICD-10-CM

## 2024-02-17 DIAGNOSIS — G5793 Unspecified mononeuropathy of bilateral lower limbs: Secondary | ICD-10-CM | POA: Insufficient documentation

## 2024-02-17 MED ORDER — OXYCODONE-ACETAMINOPHEN 7.5-325 MG PO TABS: 1.0000 | ORAL_TABLET | Freq: Four times a day (QID) | ORAL | 0 refills | Status: DC | PRN

## 2024-02-21 ENCOUNTER — Ambulatory Visit: Payer: No Typology Code available for payment source

## 2024-02-21 DIAGNOSIS — E1165 Type 2 diabetes mellitus with hyperglycemia: Secondary | ICD-10-CM

## 2024-02-21 DIAGNOSIS — R600 Localized edema: Secondary | ICD-10-CM

## 2024-02-21 DIAGNOSIS — M2141 Flat foot [pes planus] (acquired), right foot: Secondary | ICD-10-CM

## 2024-02-22 NOTE — Progress Notes (Signed)
 Patient presents to the office today for diabetic shoe and insole measuring.  Patient was measured with brannock device to determine size and width for 1 pair of extra depth shoes and 3 pair of insoles.   Documentation of medical necessity will be sent to patient's treating diabetic doctor to verify and sign.   Patient's diabetic provider: Georganna Skeans MD   Shoes and insoles will be ordered at that time and patient will be notified for an appointment for fitting when they arrive.   Shoe size (per patient): 9 Shoe choice:   [id number] 9wd / [id number] 8wd Shoe size ordered: 9w  Ppw and ABN signed

## 2024-02-23 ENCOUNTER — Other Ambulatory Visit: Payer: Self-pay | Admitting: Family

## 2024-02-23 DIAGNOSIS — E785 Hyperlipidemia, unspecified: Secondary | ICD-10-CM

## 2024-02-24 ENCOUNTER — Telehealth: Payer: Self-pay | Admitting: Family

## 2024-02-24 NOTE — Telephone Encounter (Signed)
 Complete

## 2024-02-24 NOTE — Telephone Encounter (Signed)
 A document form has been faxed:  Statement of Certifying Physician for therapeutic shoes , to be filled out by provider. Send document back via Fax within 5-days. Document is located in providers tray at front office.          Fax number:  [phone]

## 2024-03-06 ENCOUNTER — Ambulatory Visit: Payer: Self-pay | Admitting: *Deleted

## 2024-03-06 ENCOUNTER — Ambulatory Visit: Payer: Self-pay

## 2024-03-06 DIAGNOSIS — Z6838 Body mass index (BMI) 38.0-38.9, adult: Secondary | ICD-10-CM | POA: Diagnosis not present

## 2024-03-06 DIAGNOSIS — R2681 Unsteadiness on feet: Secondary | ICD-10-CM | POA: Diagnosis not present

## 2024-03-06 DIAGNOSIS — I10 Essential (primary) hypertension: Secondary | ICD-10-CM | POA: Diagnosis not present

## 2024-03-06 DIAGNOSIS — Z008 Encounter for other general examination: Secondary | ICD-10-CM | POA: Diagnosis not present

## 2024-03-06 DIAGNOSIS — E1169 Type 2 diabetes mellitus with other specified complication: Secondary | ICD-10-CM | POA: Diagnosis not present

## 2024-03-06 DIAGNOSIS — F1120 Opioid dependence, uncomplicated: Secondary | ICD-10-CM | POA: Diagnosis not present

## 2024-03-06 DIAGNOSIS — E785 Hyperlipidemia, unspecified: Secondary | ICD-10-CM | POA: Diagnosis not present

## 2024-03-06 NOTE — Telephone Encounter (Signed)
 Copied from CRM [id number]. Topic: Clinical - Pink Word Triage >> Mar 06, 2024 12:06 PM Hayley Padilla wrote: Reason for Triage: pt seen 2-3 weeks ago, treated for bronchitis.  However now she is dealing w/ sinus issues, one side (right)  continues to be b/ocked up.  Does she need appt or can something be called in? >> Mar 06, 2024 12:10 PM Hayley Padilla wrote: Malvin Johns Dr Lenord Fellers for her acute issue this day   Chief Complaint: nasal congestion- patient feels she has lingering symptoms from visit 02/14/24 Symptoms: nasal congestion- no other complaint  Frequency: 02/14/24 Pertinent Negatives: Patient denies sore throat, cough, earache, difficulty breathing Disposition: [] ED /[] Urgent Care (no appt availability in office) / [] Appointment(In office/virtual)/ []  [place] [facility]/ [] Home Care/ [] Refused Recommended Disposition /[] [place] [facility]/ [x]  Follow-up with PCP Additional Notes: Patient offered appointment with alternative provider- she would like to see her PCP- she states she is not that familiar with [facility]. Patient advised I would send her request for review     Reason for Disposition  [1] Sinus congestion (pressure, fullness) AND [2] present > 10 days  Answer Assessment - Initial Assessment Questions 1. LOCATION: "Where does it hurt?"      No pain- nasal congestion 2. ONSET: "When did the sinus pain start?"  (e.g., hours, days)      02/14/24- patient believes she never completely cleared bronchitis 3. SEVERITY: "How bad is the pain?"   (Scale 1-10; mild, moderate or severe)   - MILD (1-3): doesn't interfere with normal activities    - MODERATE (4-7): interferes with normal activities (e.g., work or school) or awakens from sleep   - SEVERE (8-10): excruciating pain and patient unable to do any normal activities        No pain 4. RECURRENT SYMPTOM: "Have you ever had sinus problems before?" If Yes, ask: "When was the last time?" and "What happened that time?"      Never before 5. NASAL  CONGESTION: "Is the nose blocked?" If Yes, ask: "Can you open it or must you breathe through your mouth?"     She can clear passages - using Dristan nasal spray 6. NASAL DISCHARGE: "Do you have discharge from your nose?" If so ask, "What color?"     Clear to yellow 7. FEVER: "Do you have a fever?" If Yes, ask: "What is it, how was it measured, and when did it start?"      no 8. OTHER SYMPTOMS: "Do you have any other symptoms?" (e.g., sore throat, cough, earache, difficulty breathing)     no  Protocols used: Sinus Pain or Congestion-A-AH

## 2024-03-06 NOTE — Telephone Encounter (Signed)
 Sent 02/25/2024

## 2024-03-06 NOTE — Telephone Encounter (Signed)
 Called patient no answer and cannot leave vm went ahead and scheduled patient she should see appointment time and date in her Mychart

## 2024-03-06 NOTE — Telephone Encounter (Signed)
  Chief Complaint: No contact call  Disposition: [] ED /[] Urgent Care (no appt availability in office) / [] Appointment(In office/virtual)/ []  [place] [facility]/ [] Home Care/ [] Refused Recommended Disposition /[] [place] [facility]/ [x]  Follow-up with PCP Additional Notes: Patient has been triaged awaiting f/u from PCP.   Reason for Disposition  Caller has already spoken with another triager and has no further questions.  Answer Assessment - Initial Assessment Questions N/A Patient has already been triaged.  Protocols used: No Contact or Duplicate Contact Call-A-AH

## 2024-03-08 ENCOUNTER — Ambulatory Visit (INDEPENDENT_AMBULATORY_CARE_PROVIDER_SITE_OTHER): Admitting: Family

## 2024-03-08 ENCOUNTER — Ambulatory Visit (INDEPENDENT_AMBULATORY_CARE_PROVIDER_SITE_OTHER)

## 2024-03-08 VITALS — BP 128/78 | HR 76 | Temp 98.6°F | Ht 62.0 in | Wt 203.2 lb

## 2024-03-08 DIAGNOSIS — M2141 Flat foot [pes planus] (acquired), right foot: Secondary | ICD-10-CM

## 2024-03-08 DIAGNOSIS — M19072 Primary osteoarthritis, left ankle and foot: Secondary | ICD-10-CM

## 2024-03-08 DIAGNOSIS — M2142 Flat foot [pes planus] (acquired), left foot: Secondary | ICD-10-CM | POA: Diagnosis not present

## 2024-03-08 DIAGNOSIS — J329 Chronic sinusitis, unspecified: Secondary | ICD-10-CM

## 2024-03-08 DIAGNOSIS — M19071 Primary osteoarthritis, right ankle and foot: Secondary | ICD-10-CM

## 2024-03-08 DIAGNOSIS — E1165 Type 2 diabetes mellitus with hyperglycemia: Secondary | ICD-10-CM | POA: Diagnosis not present

## 2024-03-08 DIAGNOSIS — R600 Localized edema: Secondary | ICD-10-CM

## 2024-03-08 MED ORDER — PREDNISONE 10 MG PO TABS: ORAL_TABLET | ORAL | 0 refills | Status: AC

## 2024-03-08 MED ORDER — GUAIFENESIN 200 MG PO TABS: 200.0000 mg | ORAL_TABLET | ORAL | 1 refills | Status: AC | PRN

## 2024-03-08 MED ORDER — AZITHROMYCIN 250 MG PO TABS: ORAL_TABLET | ORAL | 0 refills | Status: DC

## 2024-03-08 NOTE — Progress Notes (Signed)
 Patient states nothing other to discuss.

## 2024-03-08 NOTE — Progress Notes (Signed)
 Patient presents today to pick up diabetic shoes and insoles.  Patient was dispensed 1 pair of diabetic shoes and 3 pairs of diabetic insoles. Fit was satisfactory. Instructions for break-in and wear was reviewed and a copy was given to the patient.   Re-appointment for regularly scheduled diabetic foot care visits or if they should experience any trouble with the shoes or insoles.

## 2024-03-08 NOTE — Progress Notes (Signed)
 Patient ID: Hayley Padilla, female    DOB: 1951-04-12  MRN: [id number]  CC: Sinus Congestion  Subjective: Hayley Padilla is a 73 y.o. female who presents for sinus congestion.  Her concerns today include:  Reports she was recently seen for bronchitis and prescribed an antibiotic which helped. Reports sinus congestion persisting. Denies red flag symptoms. She has tried over-the-counter medications and nasal spray with minimal relief. No further issues/concerns for discussion today.   Patient Active Problem List   Diagnosis Date Noted   Diabetes mellitus, type 2 (HCC) 06/24/2023   Vertebral osteomyelitis (HCC) 03/12/2022   Hypokalemia 03/12/2022   Edema of right lower extremity 01/15/2022   Essential hypertension 01/14/2022   Chronic pain 01/14/2022   HLD (hyperlipidemia) 01/14/2022   Discitis 01/14/2022   Obesity (BMI 30-39.9) 01/13/2022   S/P lumbar spinal fusion 01/13/2022   Discitis of lumbar region 01/13/2022   Kyphosis 01/13/2022   Degenerative spondylolisthesis 06/12/2019     Current Outpatient Medications on File Prior to Visit  Medication Sig Dispense Refill   acetaminophen (TYLENOL) 650 MG CR tablet Take 650 mg by mouth every 8 (eight) hours as needed for pain.     amLODipine (NORVASC) 10 MG tablet Take 1 tablet (10 mg total) by mouth daily. 90 tablet 0   cloNIDine (CATAPRES) 0.1 MG tablet Take 1 tablet (0.1 mg total) by mouth 2 (two) times daily. 180 tablet 0   Continuous Glucose Receiver (DEXCOM G6 RECEIVER) DEVI 1 each by Other route 4 (four) times daily -  before meals and at bedtime. 1 each 2   Continuous Glucose Receiver (FREESTYLE LIBRE 2 READER) DEVI 1 each by Other route 4 (four) times daily -  before meals and at bedtime. 1 each 2   Continuous Glucose Sensor (DEXCOM G6 SENSOR) MISC 1 each by Other route 4 (four) times daily -  before meals and at bedtime. 3 each 2   Continuous Glucose Sensor (FREESTYLE LIBRE 2 PLUS SENSOR) MISC 1 each by Other route 4 (four)  times daily -  before meals and at bedtime. 1 each 2   Continuous Glucose Transmitter (DEXCOM G6 TRANSMITTER) MISC 1 each by Other route 4 (four) times daily -  before meals and at bedtime. 1 each 2   diclofenac (VOLTAREN) 75 MG EC tablet Take 75 mg by mouth 2 (two) times daily.     furosemide (LASIX) 40 MG tablet Take 40 mg by mouth daily.     gabapentin (NEURONTIN) 300 MG capsule Take 1 capsule (300 mg total) by mouth 3 (three) times daily. 270 capsule 0   Iron, Ferrous Sulfate, 325 (65 Fe) MG TABS Take 325 mg by mouth daily. 90 tablet 0   losartan (COZAAR) 100 MG tablet Take 1 tablet (100 mg total) by mouth daily. 90 tablet 0   lovastatin (MEVACOR) 40 MG tablet TAKE 1 TABLET BY MOUTH EVERY EVENING. HOLD WHILE ON DAPTOMYCIN- CAN RESUME ONCE COURSE COMPLETED 90 tablet 0   metFORMIN (GLUCOPHAGE-XR) 500 MG 24 hr tablet Take 1 tablet (500 mg total) by mouth daily with breakfast. 90 tablet 0   oxyCODONE-acetaminophen (PERCOCET) 7.5-325 MG tablet Take 1 tablet by mouth 4 (four) times daily as needed. 120 tablet 0   polyethylene glycol (MIRALAX / GLYCOLAX) 17 g packet Take 17 g by mouth daily as needed. 90 each 0   potassium chloride (KLOR-CON) 10 MEQ tablet Take 1 tablet (10 mEq total) by mouth daily. 90 tablet 0   senna-docusate (SENOKOT-S) 8.6-50 MG tablet  Take 1 tablet by mouth at bedtime.     Vitamin D, Ergocalciferol, (DRISDOL) 1.25 MG (50000 UNIT) CAPS capsule Take 50,000 Units by mouth every Monday.     benzonatate (TESSALON) 100 MG capsule Take 1 capsule (100 mg total) by mouth 3 (three) times daily as needed for cough. (Patient not taking: Reported on 03/08/2024) 30 capsule 0   buprenorphine (BUTRANS) 10 MCG/HR PTWK  (Patient not taking: Reported on 03/08/2024)     No current facility-administered medications on file prior to visit.    Allergies  Allergen Reactions   Penicillins Anaphylaxis    Did it involve swelling of the face/tongue/throat, SOB, or low BP? Yes Did it involve sudden or  severe rash/hives, skin peeling, or any reaction on the inside of your mouth or nose? No Did you need to seek medical attention at a hospital or doctor's office? Yes When did it last happen?      "Been a While"  If all above answers are "NO", may proceed with cephalosporin use.    Aspirin Other (See Comments)    brusing _ patient requested to be listed    Social History   Socioeconomic History   Marital status: Widowed    Spouse name: Not on file   Number of children: Not on file   Years of education: Not on file   Highest education level: Not on file  Occupational History   Not on file  Tobacco Use   Smoking status: Never   Smokeless tobacco: Current    Types: Chew   Tobacco comments:    last used 03/17/2020 @ 2200  Vaping Use   Vaping status: Never Used  Substance and Sexual Activity   Alcohol use: Not Currently   Drug use: Never   Sexual activity: Not on file  Other Topics Concern   Not on file  Social History Narrative   Not on file   Social Drivers of Health   Financial Resource Strain: Not on file  Food Insecurity: Not on file  Transportation Needs: Not on file  Physical Activity: Not on file  Stress: Not on file  Social Connections: Not on file  Intimate Partner Violence: Not on file    Family History  Problem Relation Age of Onset   Colon cancer Neg Hx    Colon polyps Neg Hx    Esophageal cancer Neg Hx    Rectal cancer Neg Hx    Stomach cancer Neg Hx     Past Surgical History:  Procedure Laterality Date   BACK SURGERY     lower back   IR LUMBAR DISC ASPIRATION W/IMG GUIDE  01/15/2022   LAMINECTOMY WITH POSTERIOR LATERAL ARTHRODESIS LEVEL 1 N/A 03/16/2022   Procedure: Lumbar One-Two Posterior Lateral Fusion with pedicle screws;  Surgeon: Julio Sicks, MD;  Location: [facility] OR;  Service: Neurosurgery;  Laterality: N/A;   MULTIPLE TOOTH EXTRACTIONS      ROS: Review of Systems Negative except as stated above  PHYSICAL EXAM: BP 128/78   Pulse 76   Temp  98.6 F (37 C) (Oral)   Ht 5\' 2"  (1.575 m)   Wt 203 lb 3.2 oz (92.2 kg)   SpO2 97%   BMI 37.17 kg/m   Physical Exam HENT:     Head: Normocephalic and atraumatic.     Right Ear: Tympanic membrane, ear canal and external ear normal.     Left Ear: Tympanic membrane, ear canal and external ear normal.     Nose: Nose normal.  Mouth/Throat:     Mouth: Mucous membranes are moist.     Pharynx: Oropharynx is clear.  Eyes:     Extraocular Movements: Extraocular movements intact.     Conjunctiva/sclera: Conjunctivae normal.     Pupils: Pupils are equal, round, and reactive to light.  Cardiovascular:     Rate and Rhythm: Normal rate and regular rhythm.     Pulses: Normal pulses.     Heart sounds: Normal heart sounds.  Pulmonary:     Effort: Pulmonary effort is normal.     Breath sounds: Normal breath sounds.  Musculoskeletal:        General: Normal range of motion.     Cervical back: Normal range of motion and neck supple.  Neurological:     General: No focal deficit present.     Mental Status: She is alert and oriented to person, place, and time.  Psychiatric:        Mood and Affect: Mood normal.        Behavior: Behavior normal.     ASSESSMENT AND PLAN: 1. Sinusitis, unspecified chronicity, unspecified location (Primary) - Patient today in office with no cardiopulmonary/acute distress.  - Azithromycin, Prednisone, and Guaifenesin as prescribed. Counseled on medication adherence/adverse effects.  - Patient declined respiratory panel. - Follow-up with primary provider as scheduled. - azithromycin (ZITHROMAX Z-PAK) 250 MG tablet; Take 2 tablets (500 mg) on  Day 1,  followed by 1 tablet (250 mg) once daily on Days 2 through 5.  Dispense: 6 each; Refill: 0 - predniSONE (DELTASONE) 10 MG tablet; Take 6 tablets (60 mg total) by mouth daily with breakfast for 1 day, THEN 5 tablets (50 mg total) daily with breakfast for 1 day, THEN 4 tablets (40 mg total) daily with breakfast for 1  day, THEN 3 tablets (30 mg total) daily with breakfast for 1 day, THEN 2 tablets (20 mg total) daily with breakfast for 1 day, THEN 1 tablet (10 mg total) daily with breakfast for 1 day.  Dispense: 21 tablet; Refill: 0 - guaiFENesin 200 MG tablet; Take 1 tablet (200 mg total) by mouth every 4 (four) hours as needed for cough or to loosen phlegm.  Dispense: 30 tablet; Refill: 1   Patient was given the opportunity to ask questions.  Patient verbalized understanding of the plan and was able to repeat key elements of the plan. Patient was given clear instructions to go to Emergency Department or return to medical center if symptoms don't improve, worsen, or new problems develop.The patient verbalized understanding.    Requested Prescriptions   Signed Prescriptions Disp Refills   azithromycin (ZITHROMAX Z-PAK) 250 MG tablet 6 each 0    Sig: Take 2 tablets (500 mg) on  Day 1,  followed by 1 tablet (250 mg) once daily on Days 2 through 5.   predniSONE (DELTASONE) 10 MG tablet 21 tablet 0    Sig: Take 6 tablets (60 mg total) by mouth daily with breakfast for 1 day, THEN 5 tablets (50 mg total) daily with breakfast for 1 day, THEN 4 tablets (40 mg total) daily with breakfast for 1 day, THEN 3 tablets (30 mg total) daily with breakfast for 1 day, THEN 2 tablets (20 mg total) daily with breakfast for 1 day, THEN 1 tablet (10 mg total) daily with breakfast for 1 day.   guaiFENesin 200 MG tablet 30 tablet 1    Sig: Take 1 tablet (200 mg total) by mouth every 4 (four) hours as needed for cough  or to loosen phlegm.    Follow-up with primary provider as scheduled.  Rema Fendt, NP

## 2024-03-15 ENCOUNTER — Other Ambulatory Visit: Payer: Self-pay | Admitting: Family

## 2024-03-15 DIAGNOSIS — E1165 Type 2 diabetes mellitus with hyperglycemia: Secondary | ICD-10-CM

## 2024-03-15 DIAGNOSIS — I10 Essential (primary) hypertension: Secondary | ICD-10-CM

## 2024-03-20 ENCOUNTER — Encounter: Attending: Registered Nurse | Admitting: Registered Nurse

## 2024-03-20 ENCOUNTER — Encounter: Payer: Self-pay | Admitting: Registered Nurse

## 2024-03-20 VITALS — BP 118/72 | HR 71 | Ht 62.0 in | Wt 204.8 lb

## 2024-03-20 DIAGNOSIS — M170 Bilateral primary osteoarthritis of knee: Secondary | ICD-10-CM | POA: Diagnosis not present

## 2024-03-20 DIAGNOSIS — M1712 Unilateral primary osteoarthritis, left knee: Secondary | ICD-10-CM | POA: Diagnosis not present

## 2024-03-20 DIAGNOSIS — M5416 Radiculopathy, lumbar region: Secondary | ICD-10-CM

## 2024-03-20 DIAGNOSIS — M1711 Unilateral primary osteoarthritis, right knee: Secondary | ICD-10-CM | POA: Diagnosis not present

## 2024-03-20 DIAGNOSIS — Z79891 Long term (current) use of opiate analgesic: Secondary | ICD-10-CM

## 2024-03-20 DIAGNOSIS — G894 Chronic pain syndrome: Secondary | ICD-10-CM | POA: Diagnosis not present

## 2024-03-20 DIAGNOSIS — Z5181 Encounter for therapeutic drug level monitoring: Secondary | ICD-10-CM | POA: Diagnosis not present

## 2024-03-20 MED ORDER — OXYCODONE-ACETAMINOPHEN 7.5-325 MG PO TABS: 1.0000 | ORAL_TABLET | Freq: Four times a day (QID) | ORAL | 0 refills | Status: DC | PRN

## 2024-03-20 NOTE — Progress Notes (Signed)
 Subjective:    Patient ID: Hayley Padilla, female    DOB: [birth year], 73 y.o.   MRN: [id number]  HPI: Hayley Padilla is a 73 y.o. female who returns for follow up appointment for chronic pain and medication refill. She states her pain is located in her lower back radiating into her buttocks and bilateral knee pain. She rates her pain 10. Her current exercise regime is walking with her walker and using her Peddler for 30 minutes 7 days a week and performing stretching exercises.  Ms. [place] Morphine equivalent is 45.00 MME.   Last UDS was Performed on 12/23/2023, it was consistent.      Pain Inventory Average Pain 10 Pain Right Now 10 My pain is dull  In the last 24 hours, has pain interfered with the following? General activity 10 Relation with others 0 Enjoyment of life 10 What TIME of day is your pain at its worst? daytime Sleep (in general) Fair  Pain is worse with: walking and standing Pain improves with: heat/ice and medication Relief from Meds: 7  Family History  Problem Relation Age of Onset   Colon cancer Neg Hx    Colon polyps Neg Hx    Esophageal cancer Neg Hx    Rectal cancer Neg Hx    Stomach cancer Neg Hx    Social History   Socioeconomic History   Marital status: Widowed    Spouse name: Not on file   Number of children: Not on file   Years of education: Not on file   Highest education level: Not on file  Occupational History   Not on file  Tobacco Use   Smoking status: Never   Smokeless tobacco: Current    Types: Chew   Tobacco comments:    last used 03/17/2020 @ 2200  Vaping Use   Vaping status: Never Used  Substance and Sexual Activity   Alcohol use: Not Currently   Drug use: Never   Sexual activity: Not on file  Other Topics Concern   Not on file  Social History Narrative   Not on file   Social Drivers of Health   Financial Resource Strain: Not on file  Food Insecurity: Not on file  Transportation Needs: Not on file  Physical Activity:  Not on file  Stress: Not on file  Social Connections: Not on file   Past Surgical History:  Procedure Laterality Date   BACK SURGERY     lower back   IR LUMBAR DISC ASPIRATION W/IMG GUIDE  01/15/2022   LAMINECTOMY WITH POSTERIOR LATERAL ARTHRODESIS LEVEL 1 N/A 03/16/2022   Procedure: Lumbar One-Two Posterior Lateral Fusion with pedicle screws;  Surgeon: Julio Sicks, MD;  Location: [facility] OR;  Service: Neurosurgery;  Laterality: N/A;   MULTIPLE TOOTH EXTRACTIONS     Past Surgical History:  Procedure Laterality Date   BACK SURGERY     lower back   IR LUMBAR DISC ASPIRATION W/IMG GUIDE  01/15/2022   LAMINECTOMY WITH POSTERIOR LATERAL ARTHRODESIS LEVEL 1 N/A 03/16/2022   Procedure: Lumbar One-Two Posterior Lateral Fusion with pedicle screws;  Surgeon: Julio Sicks, MD;  Location: [facility] OR;  Service: Neurosurgery;  Laterality: N/A;   MULTIPLE TOOTH EXTRACTIONS     Past Medical History:  Diagnosis Date   Allergy    Arthritis    Diabetes mellitus without complication (HCC)    Hyperlipidemia    Hypertension    PONV (postoperative nausea and vomiting)    There were no vitals taken for this  visit.  Opioid Risk Score:   Fall Risk Score:  `1  Depression screen [facility] 2/9     02/17/2024    2:41 PM 01/17/2024    1:13 PM 12/23/2023   10:03 AM 10/20/2023    1:07 PM 10/18/2023    2:58 PM 09/24/2023    1:44 PM 08/20/2023    2:51 PM  Depression screen PHQ 2/9  Decreased Interest 0 0 0 0 0 0 0  Down, Depressed, Hopeless 0 0 0 0  0 0  PHQ - 2 Score 0 0 0 0 0 0 0    Review of Systems  Constitutional: Negative.   HENT: Negative.    Eyes: Negative.   Respiratory: Negative.    Cardiovascular: Negative.   Endocrine: Negative.   Genitourinary: Negative.   Musculoskeletal:  Positive for arthralgias and back pain.  Skin: Negative.   Allergic/Immunologic: Negative.   Neurological: Negative.   Hematological: Negative.   Psychiatric/Behavioral: Negative.         Objective:   Physical Exam Vitals and  nursing note reviewed.  Constitutional:      Appearance: Normal appearance. She is obese.  Cardiovascular:     Rate and Rhythm: Normal rate and regular rhythm.     Pulses: Normal pulses.     Heart sounds: Normal heart sounds.  Pulmonary:     Effort: Pulmonary effort is normal.     Breath sounds: Normal breath sounds.  Musculoskeletal:     Comments: Normal Muscle Bulk and Muscle Testing Reveals:  Upper Extremities: Decreased ROM  90 Degrees and Muscle Strength  5/5  Lumbar Paraspinal Tenderness: L-4-L-5 Lower Extremities: Decreased ROM and Muscle Strength  5/5 Arises from Table slowly using Walker for Support Antalgic Gait     Skin:    General: Skin is warm and dry.  Neurological:     Mental Status: She is alert and oriented to person, place, and time.  Psychiatric:        Mood and Affect: Mood normal.        Behavior: Behavior normal.         Assessment & Plan:  Lumbar Post Laminectomy Syndrome: Ms. Tallon underwent on 03/17/2023 : Dr Jordan Likes : Lumbar One-Two Posterior Lateral Fusion with pedicle screws  Continue HEP as Tolerated. Continue to Monitor. 03/20/2024 2. Lumbar Radiculitis: Continue Gabapentin. Continue to monitor. 03/20/2024 3. Chronic Bilateral Hip Pain: No complaints today. Continue HEP as Tolerated. Continue to Monitor. 03/20/2024 4. Chronic Pain of Bilateral Knees: R>LContinue HEP as Tolerated. Continue to Monitor. 03/20/2024 5. Chronic Pain Syndrome: Refilled: Oxycodone 7.5/325 mg one tablet 4 times a day as needed for pain #120.  We will continue the opioid monitoring program, this consists of regular clinic visits, examinations, urine drug screen, pill counts as well as use of [place] Controlled Substance Reporting system. A 12 month History has been reviewed on the [place] Controlled Substance Reporting System on 03/20/2024. 6, Left Shoulder pain: No complaints today. Continue to monitor.  03/20/2024  F/U in 1 month

## 2024-03-27 ENCOUNTER — Encounter: Payer: Self-pay | Admitting: Family

## 2024-03-27 ENCOUNTER — Ambulatory Visit (INDEPENDENT_AMBULATORY_CARE_PROVIDER_SITE_OTHER): Payer: No Typology Code available for payment source | Admitting: Family

## 2024-03-27 VITALS — BP 116/69 | HR 80 | Temp 98.4°F | Resp 16 | Ht 60.5 in | Wt 209.2 lb

## 2024-03-27 DIAGNOSIS — Z0000 Encounter for general adult medical examination without abnormal findings: Secondary | ICD-10-CM | POA: Diagnosis not present

## 2024-03-27 DIAGNOSIS — R262 Difficulty in walking, not elsewhere classified: Secondary | ICD-10-CM | POA: Diagnosis not present

## 2024-03-27 DIAGNOSIS — E785 Hyperlipidemia, unspecified: Secondary | ICD-10-CM | POA: Diagnosis not present

## 2024-03-27 DIAGNOSIS — Z1321 Encounter for screening for nutritional disorder: Secondary | ICD-10-CM

## 2024-03-27 DIAGNOSIS — Z9989 Dependence on other enabling machines and devices: Secondary | ICD-10-CM

## 2024-03-27 DIAGNOSIS — D649 Anemia, unspecified: Secondary | ICD-10-CM

## 2024-03-27 DIAGNOSIS — Z13228 Encounter for screening for other metabolic disorders: Secondary | ICD-10-CM

## 2024-03-27 DIAGNOSIS — E1165 Type 2 diabetes mellitus with hyperglycemia: Secondary | ICD-10-CM | POA: Diagnosis not present

## 2024-03-27 DIAGNOSIS — I10 Essential (primary) hypertension: Secondary | ICD-10-CM | POA: Diagnosis not present

## 2024-03-27 DIAGNOSIS — Z0100 Encounter for examination of eyes and vision without abnormal findings: Secondary | ICD-10-CM

## 2024-03-27 DIAGNOSIS — Z1329 Encounter for screening for other suspected endocrine disorder: Secondary | ICD-10-CM | POA: Diagnosis not present

## 2024-03-27 DIAGNOSIS — N898 Other specified noninflammatory disorders of vagina: Secondary | ICD-10-CM

## 2024-03-27 DIAGNOSIS — E119 Type 2 diabetes mellitus without complications: Secondary | ICD-10-CM

## 2024-03-27 MED ORDER — LOVASTATIN 40 MG PO TABS: 40.0000 mg | ORAL_TABLET | Freq: Every day | ORAL | 0 refills | Status: DC

## 2024-03-27 MED ORDER — AMLODIPINE BESYLATE 10 MG PO TABS: 10.0000 mg | ORAL_TABLET | Freq: Every day | ORAL | 0 refills | Status: DC

## 2024-03-27 MED ORDER — LOSARTAN POTASSIUM 100 MG PO TABS: 100.0000 mg | ORAL_TABLET | Freq: Every day | ORAL | 0 refills | Status: DC

## 2024-03-27 MED ORDER — SITAGLIPTIN PHOSPHATE 25 MG PO TABS: 25.0000 mg | ORAL_TABLET | Freq: Every day | ORAL | 0 refills | Status: DC

## 2024-03-27 MED ORDER — CLONIDINE HCL 0.1 MG PO TABS: 0.1000 mg | ORAL_TABLET | Freq: Two times a day (BID) | ORAL | 0 refills | Status: DC

## 2024-03-27 NOTE — Progress Notes (Signed)
 Patient ID: Hayley Padilla, female    DOB: 04/03/1951  MRN: [id number]  CC: Annual Exam  Subjective: Hayley Padilla is a 73 y.o. female who presents for annual exam.   Her concerns today include:  - Doing well on Losartan, Amlodipine, and Clonidine, no issues/concerns. She does not complain of red flag symptoms such as but not limited to chest pain, shortness of breath, worst headache of life, nausea/vomiting.  - States Metformin XR causing her to become hot. States she would like to try a different diabetic medication. She denies red flag symptoms associated with diabetes.  - Doing well on Lovastatin, no issues/concerns.  - Requests Vitamin D lab. - Using a rollator to assist with walking and states she needs a new one. - Vaginal odor persisting. Requests referral to Gynecology.  Patient Active Problem List   Diagnosis Date Noted   Diabetes mellitus, type 2 (HCC) 06/24/2023   Vertebral osteomyelitis (HCC) 03/12/2022   Hypokalemia 03/12/2022   Edema of right lower extremity 01/15/2022   Essential hypertension 01/14/2022   Chronic pain 01/14/2022   HLD (hyperlipidemia) 01/14/2022   Discitis 01/14/2022   Obesity (BMI 30-39.9) 01/13/2022   S/P lumbar spinal fusion 01/13/2022   Discitis of lumbar region 01/13/2022   Kyphosis 01/13/2022   Degenerative spondylolisthesis 06/12/2019     Current Outpatient Medications on File Prior to Visit  Medication Sig Dispense Refill   acetaminophen (TYLENOL) 650 MG CR tablet Take 650 mg by mouth every 8 (eight) hours as needed for pain.     azithromycin (ZITHROMAX Z-PAK) 250 MG tablet Take 2 tablets (500 mg) on  Day 1,  followed by 1 tablet (250 mg) once daily on Days 2 through 5. 6 each 0   benzonatate (TESSALON) 100 MG capsule Take 1 capsule (100 mg total) by mouth 3 (three) times daily as needed for cough. 30 capsule 0   Continuous Glucose Receiver (DEXCOM G6 RECEIVER) DEVI 1 each by Other route 4 (four) times daily -  before meals and at  bedtime. 1 each 2   Continuous Glucose Receiver (FREESTYLE LIBRE 2 READER) DEVI 1 each by Other route 4 (four) times daily -  before meals and at bedtime. 1 each 2   Continuous Glucose Sensor (DEXCOM G6 SENSOR) MISC 1 each by Other route 4 (four) times daily -  before meals and at bedtime. 3 each 2   Continuous Glucose Sensor (FREESTYLE LIBRE 2 PLUS SENSOR) MISC 1 each by Other route 4 (four) times daily -  before meals and at bedtime. 1 each 2   Continuous Glucose Transmitter (DEXCOM G6 TRANSMITTER) MISC 1 each by Other route 4 (four) times daily -  before meals and at bedtime. 1 each 2   diclofenac (VOLTAREN) 75 MG EC tablet Take 75 mg by mouth 2 (two) times daily.     furosemide (LASIX) 40 MG tablet Take 40 mg by mouth daily.     gabapentin (NEURONTIN) 300 MG capsule Take 1 capsule (300 mg total) by mouth 3 (three) times daily. 270 capsule 0   guaiFENesin 200 MG tablet Take 1 tablet (200 mg total) by mouth every 4 (four) hours as needed for cough or to loosen phlegm. 30 tablet 1   Iron, Ferrous Sulfate, 325 (65 Fe) MG TABS Take 325 mg by mouth daily. 90 tablet 0   metFORMIN (GLUCOPHAGE-XR) 500 MG 24 hr tablet TAKE 1 TABLET(500 MG) BY MOUTH DAILY WITH BREAKFAST 90 tablet 0   oxyCODONE-acetaminophen (PERCOCET) 7.5-325 MG tablet  Take 1 tablet by mouth 4 (four) times daily as needed. 120 tablet 0   polyethylene glycol (MIRALAX / GLYCOLAX) 17 g packet Take 17 g by mouth daily as needed. 90 each 0   senna-docusate (SENOKOT-S) 8.6-50 MG tablet Take 1 tablet by mouth at bedtime.     buprenorphine (BUTRANS) 10 MCG/HR PTWK  (Patient not taking: Reported on 03/08/2024)     potassium chloride (KLOR-CON) 10 MEQ tablet Take 1 tablet (10 mEq total) by mouth daily. 90 tablet 0   Vitamin D, Ergocalciferol, (DRISDOL) 1.25 MG (50000 UNIT) CAPS capsule Take 50,000 Units by mouth every Monday. (Patient not taking: Reported on 03/27/2024)     No current facility-administered medications on file prior to visit.     Allergies  Allergen Reactions   Penicillins Anaphylaxis    Did it involve swelling of the face/tongue/throat, SOB, or low BP? Yes Did it involve sudden or severe rash/hives, skin peeling, or any reaction on the inside of your mouth or nose? No Did you need to seek medical attention at a hospital or doctor's office? Yes When did it last happen?      "Been a While"  If all above answers are "NO", may proceed with cephalosporin use.    Aspirin Other (See Comments)    brusing _ patient requested to be listed    Social History   Socioeconomic History   Marital status: Widowed    Spouse name: Not on file   Number of children: Not on file   Years of education: Not on file   Highest education level: Not on file  Occupational History   Not on file  Tobacco Use   Smoking status: Never   Smokeless tobacco: Current    Types: Chew   Tobacco comments:    last used 03/17/2020 @ 2200  Vaping Use   Vaping status: Never Used  Substance and Sexual Activity   Alcohol use: Not Currently   Drug use: Never   Sexual activity: Not on file  Other Topics Concern   Not on file  Social History Narrative   Not on file   Social Drivers of Health   Financial Resource Strain: Low Risk  (03/27/2024)   Overall Financial Resource Strain (CARDIA)    Difficulty of Paying Living Expenses: Not hard at all  Food Insecurity: No Food Insecurity (03/27/2024)   Hunger Vital Sign    Worried About Running Out of Food in the Last Year: Never true    Ran Out of Food in the Last Year: Never true  Transportation Needs: No Transportation Needs (03/27/2024)   PRAPARE - Administrator, Civil Service (Medical): No    Lack of Transportation (Non-Medical): No  Physical Activity: Inactive (03/27/2024)   Exercise Vital Sign    Days of Exercise per Week: 0 days    Minutes of Exercise per Session: 0 min  Stress: No Stress Concern Present (03/27/2024)   [employer] of Occupational Health - Occupational  Stress Questionnaire    Feeling of Stress : Only a little  Social Connections: Moderately Isolated (03/27/2024)   Social Connection and Isolation Panel [NHANES]    Frequency of Communication with Friends and Family: More than three times a week    Frequency of Social Gatherings with Friends and Family: Twice a week    Attends Religious Services: More than 4 times per year    Active Member of [employer] or Organizations: No    Attends Banker Meetings: Never  Marital Status: Widowed  Intimate Partner Violence: Not At Risk (03/27/2024)   Humiliation, Afraid, Rape, and Kick questionnaire    Fear of Current or Ex-Partner: No    Emotionally Abused: No    Physically Abused: No    Sexually Abused: No    Family History  Problem Relation Age of Onset   Colon cancer Neg Hx    Colon polyps Neg Hx    Esophageal cancer Neg Hx    Rectal cancer Neg Hx    Stomach cancer Neg Hx     Past Surgical History:  Procedure Laterality Date   BACK SURGERY     lower back   IR LUMBAR DISC ASPIRATION W/IMG GUIDE  01/15/2022   LAMINECTOMY WITH POSTERIOR LATERAL ARTHRODESIS LEVEL 1 N/A 03/16/2022   Procedure: Lumbar One-Two Posterior Lateral Fusion with pedicle screws;  Surgeon: Julio Sicks, MD;  Location: [facility] OR;  Service: Neurosurgery;  Laterality: N/A;   MULTIPLE TOOTH EXTRACTIONS      ROS: Review of Systems Negative except as stated above  PHYSICAL EXAM: BP 116/69   Pulse 80   Temp 98.4 F (36.9 C) (Oral)   Resp 16   Ht 5' 0.5" (1.537 m)   Wt 209 lb 3.2 oz (94.9 kg)   SpO2 95%   BMI 40.18 kg/m   Physical Exam HENT:     Head: Normocephalic and atraumatic.     Right Ear: Tympanic membrane, ear canal and external ear normal.     Left Ear: Tympanic membrane, ear canal and external ear normal.     Nose: Nose normal.     Mouth/Throat:     Mouth: Mucous membranes are moist.     Pharynx: Oropharynx is clear.  Eyes:     Extraocular Movements: Extraocular movements intact.      Conjunctiva/sclera: Conjunctivae normal.     Pupils: Pupils are equal, round, and reactive to light.  Neck:     Thyroid: No thyroid mass, thyromegaly or thyroid tenderness.  Cardiovascular:     Rate and Rhythm: Normal rate and regular rhythm.     Pulses: Normal pulses.     Heart sounds: Normal heart sounds.  Pulmonary:     Effort: Pulmonary effort is normal.     Breath sounds: Normal breath sounds.  Chest:     Comments: Patient declined. Abdominal:     General: Bowel sounds are normal.     Palpations: Abdomen is soft.  Genitourinary:    Comments: Patient declined. Musculoskeletal:        General: Normal range of motion.     Right shoulder: Normal.     Left shoulder: Normal.     Right upper arm: Normal.     Left upper arm: Normal.     Right elbow: Normal.     Left elbow: Normal.     Right forearm: Normal.     Left forearm: Normal.     Right wrist: Normal.     Left wrist: Normal.     Right hand: Normal.     Left hand: Normal.     Cervical back: Normal, normal range of motion and neck supple.     Thoracic back: Normal.     Lumbar back: Normal.     Right hip: Normal.     Left hip: Normal.     Right upper leg: Normal.     Left upper leg: Normal.     Right knee: Normal.     Left knee: Normal.  Right lower leg: Normal.     Left lower leg: Normal.     Right ankle: Normal.     Left ankle: Normal.     Right foot: Normal.     Left foot: Normal.  Skin:    General: Skin is warm and dry.     Capillary Refill: Capillary refill takes less than 2 seconds.  Neurological:     General: No focal deficit present.     Mental Status: She is alert and oriented to person, place, and time.  Psychiatric:        Mood and Affect: Mood normal.        Behavior: Behavior normal.     ASSESSMENT AND PLAN: 1. Annual physical exam (Primary) - Counseled on 150 minutes of exercise per week as tolerated, healthy eating (including decreased daily intake of saturated fats, cholesterol, added  sugars, sodium), STI prevention, and routine healthcare maintenance.  2. Screening for metabolic disorder - Routine screening.  - CMP14+EGFR  3. Anemia, unspecified type - Routine screening.  - CBC  4. Thyroid disorder screen - Routine screening.  - TSH  5. Encounter for vitamin deficiency screening - Routine screening.  - Vitamin D, 25-hydroxy  6. Primary hypertension - Continue Losartan, Amlodipine, and Clonidine as prescribed.  - Counseled on blood pressure goal of less than 140/90, low-sodium, DASH diet, medication compliance, 150 minutes of moderate intensity exercise per week as tolerated. Discussed medication compliance, adverse effects. - Follow-up with primary provider in 3 months or sooner if needed. - amLODipine (NORVASC) 10 MG tablet; Take 1 tablet (10 mg total) by mouth daily.  Dispense: 90 tablet; Refill: 0 - cloNIDine (CATAPRES) 0.1 MG tablet; Take 1 tablet (0.1 mg total) by mouth 2 (two) times daily.  Dispense: 180 tablet; Refill: 0 - losartan (COZAAR) 100 MG tablet; Take 1 tablet (100 mg total) by mouth daily.  Dispense: 90 tablet; Refill: 0  7. Type 2 diabetes mellitus with hyperglycemia, without long-term current use of insulin (HCC) - Metformin XR discontinued related to side effects.  - Trial Sitaliptin as prescribed. - Hemoglobin A1c result pending.  - Discussed the importance of healthy eating habits, low-carbohydrate diet, low-sugar diet, regular aerobic exercise (at least 150 minutes a week as tolerated) and medication compliance to achieve or maintain control of diabetes. Counseled on medication adherence/adverse effects.  - Follow-up with primary provider in 4 weeks or sooner if needed. - Hemoglobin A1c - sitaGLIPtin (JANUVIA) 25 MG tablet; Take 1 tablet (25 mg total) by mouth daily.  Dispense: 90 tablet; Refill: 0  8. Diabetic eye exam [facility]) - Referral to Ophthalmology for evaluation/management. - Ambulatory referral to Ophthalmology  9. Encounter  for diabetic foot exam [facility]) - Referral to Podiatry for evaluation/management. - Ambulatory referral to Podiatry  10. Hyperlipidemia, unspecified hyperlipidemia type - Continue Lovastatin as prescribed. Counseled on medication adherence/adverse effects.  - Routine screening.  - Follow-up with primary provider as scheduled. - Lipid panel - lovastatin (MEVACOR) 40 MG tablet; Take 1 tablet (40 mg total) by mouth at bedtime.  Dispense: 90 tablet; Refill: 0  11. Difficulty walking 12. Uses roller walker - Referral to Physical Therapy for evaluation/management. - Ambulatory referral to Physical Therapy  13. Vaginal odor - Referral to Gynecology for evaluation/management. - Ambulatory referral to Gynecology    Patient was given the opportunity to ask questions.  Patient verbalized understanding of the plan and was able to repeat key elements of the plan. Patient was given clear instructions to go to  Emergency Department or return to medical center if symptoms don't improve, worsen, or new problems develop.The patient verbalized understanding.   Orders Placed This Encounter  Procedures   CBC   Lipid panel   CMP14+EGFR   Hemoglobin A1c   TSH   Vitamin D, 25-hydroxy   Ambulatory referral to Gynecology   Ambulatory referral to Physical Therapy   Ambulatory referral to Podiatry   Ambulatory referral to Ophthalmology     Requested Prescriptions   Signed Prescriptions Disp Refills   sitaGLIPtin (JANUVIA) 25 MG tablet 90 tablet 0    Sig: Take 1 tablet (25 mg total) by mouth daily.   amLODipine (NORVASC) 10 MG tablet 90 tablet 0    Sig: Take 1 tablet (10 mg total) by mouth daily.   cloNIDine (CATAPRES) 0.1 MG tablet 180 tablet 0    Sig: Take 1 tablet (0.1 mg total) by mouth 2 (two) times daily.   losartan (COZAAR) 100 MG tablet 90 tablet 0    Sig: Take 1 tablet (100 mg total) by mouth daily.   lovastatin (MEVACOR) 40 MG tablet 90 tablet 0    Sig: Take 1 tablet (40 mg total) by  mouth at bedtime.    Return in about 1 year (around 03/27/2025) for Physical per patient preference.  Senaida Dama, NP

## 2024-03-27 NOTE — Progress Notes (Signed)
 Patient does not like the side effects of the metformin, needs a referral to gynecologist, needs a update walker

## 2024-03-28 ENCOUNTER — Encounter: Payer: Self-pay | Admitting: Family

## 2024-03-28 LAB — CBC
Hematocrit: 34 % (ref 34.0–46.6)
Hemoglobin: 10.3 g/dL — ABNORMAL LOW (ref 11.1–15.9)
MCH: 23.6 pg — ABNORMAL LOW (ref 26.6–33.0)
MCHC: 30.3 g/dL — ABNORMAL LOW (ref 31.5–35.7)
MCV: 78 fL — ABNORMAL LOW (ref 79–97)
Platelets: 319 10*3/uL (ref 150–450)
RBC: 4.36 x10E6/uL (ref 3.77–5.28)
RDW: 15.4 % (ref 11.7–15.4)
WBC: 5.1 10*3/uL (ref 3.4–10.8)

## 2024-03-28 LAB — CMP14+EGFR
ALT: 9 IU/L (ref 0–32)
AST: 15 IU/L (ref 0–40)
Albumin: 4.1 g/dL (ref 3.8–4.8)
Alkaline Phosphatase: 105 IU/L (ref 44–121)
BUN/Creatinine Ratio: 21 (ref 12–28)
BUN: 12 mg/dL (ref 8–27)
Bilirubin Total: 0.2 mg/dL (ref 0.0–1.2)
CO2: 23 mmol/L (ref 20–29)
Calcium: 8.9 mg/dL (ref 8.7–10.3)
Chloride: 107 mmol/L — ABNORMAL HIGH (ref 96–106)
Creatinine, Ser: 0.58 mg/dL (ref 0.57–1.00)
Globulin, Total: 1.8 g/dL (ref 1.5–4.5)
Glucose: 154 mg/dL — ABNORMAL HIGH (ref 70–99)
Potassium: 3.9 mmol/L (ref 3.5–5.2)
Sodium: 142 mmol/L (ref 134–144)
Total Protein: 5.9 g/dL — ABNORMAL LOW (ref 6.0–8.5)
eGFR: 95 mL/min/{1.73_m2} (ref >59)

## 2024-03-28 LAB — HEMOGLOBIN A1C
Est. average glucose Bld gHb Est-mCnc: 128 mg/dL
Hgb A1c MFr Bld: 6.1 % — ABNORMAL HIGH (ref 4.8–5.6)

## 2024-03-28 LAB — LIPID PANEL
Chol/HDL Ratio: 3 ratio (ref 0.0–4.4)
Cholesterol, Total: 137 mg/dL (ref 100–199)
HDL: 46 mg/dL (ref >39)
LDL Chol Calc (NIH): 68 mg/dL (ref 0–99)
Triglycerides: 127 mg/dL (ref 0–149)
VLDL Cholesterol Cal: 23 mg/dL (ref 5–40)

## 2024-03-28 LAB — TSH: TSH: 0.749 u[IU]/mL (ref 0.450–4.500)

## 2024-03-28 LAB — VITAMIN D 25 HYDROXY (VIT D DEFICIENCY, FRACTURES): Vit D, 25-Hydroxy: 60.6 ng/mL (ref 30.0–100.0)

## 2024-04-03 ENCOUNTER — Encounter: Payer: Self-pay | Admitting: Emergency Medicine

## 2024-04-10 ENCOUNTER — Ambulatory Visit: Payer: Self-pay

## 2024-04-10 NOTE — Telephone Encounter (Signed)
 Patient called in to review lab results from her appt on 03/27/24. Patient given information from PCP note. Patient states she already picked up the Sitagliptin  and has started it. Patient encouraged to continue taking daily iron  supplement, and states she has been doing so. Patient assisted with making follow up appt, per PCP note. Patient does not have any further questions at this time.   Copied from CRM [id number]. Topic: Clinical - Lab/Test Results >> Apr 10, 2024  1:50 PM Lynnie Saucier S wrote: Reason for CRM: Patient is calling for lab results. She cannot get in to her My[facility] Reason for Disposition  Health Information question, no triage required and triager able to answer question  Answer Assessment - Initial Assessment Questions 1. REASON FOR CALL or QUESTION: "What is your reason for calling today?" or "How can I best help you?" or "What question do you have that I can help answer?"     Patient wanted to review lab results and note from doctor  Protocols used: Information Only Call - No Triage-A-AH

## 2024-04-13 ENCOUNTER — Ambulatory Visit (INDEPENDENT_AMBULATORY_CARE_PROVIDER_SITE_OTHER): Admitting: Podiatry

## 2024-04-13 DIAGNOSIS — G5791 Unspecified mononeuropathy of right lower limb: Secondary | ICD-10-CM

## 2024-04-13 NOTE — Patient Instructions (Signed)
 Try wearing your shoe without the pad in it.  Make sure the lace in the front is not too tight.  Wear a thick pair of cotton socks such as a diabetic sock to pad the front of the ankle.  If it is not better in 1 month let me know

## 2024-04-16 NOTE — Progress Notes (Signed)
  Subjective:  Patient ID: Hayley Padilla, female    DOB: [birth year],  MRN: [id number]  Chief Complaint  Patient presents with   Foot Pain    Pt. Wants foot evaluation of new diabetic shoe. 7 pain. Right dorsal foot pain radiated up leg. Mostly at night.    73 y.o. female presents with the above complaint. History confirmed with patient.  She received her shoes they are comfortable but she is having burning aching pain on the front of the ankle  Objective:  Physical Exam: warm, good capillary refill, no trophic changes or ulcerative lesions, normal DP and PT pulses, normal sensory exam, and no varicosities noted, she has mild to moderate nonpitting edema around the bilateral ankle and sinus tarsi, some tenderness in the sinus tarsi to palpation.  Good smooth range of motion without pain.  No reproducible pain to palpation along the peroneal tendons the pain she reports is in the area of the common peroneal nerve.  Weakness in lateral and anterior compartment on right lower extremity.  2 cm leg length shorter on the right.  No reproducible pain today   Radiographs: Multiple views x-ray of both ankles mild to moderate degenerative changes in the ankle joint Assessment:   1. Neuritis of right foot       Plan:  Patient was evaluated and treated and all questions answered. She is seem to fit well at do think that she had a lace this time too tight as well as edema we discussed reduction of this with elevating her legs and using compression stockings I advised her to sure that the laces are not too tight at the front of the ankle because she is developing compression neuritis here as well as wearing a thick pair of cotton diabetic socks to (well.  Follow-up as needed.  Return if symptoms worsen or fail to improve.

## 2024-04-17 ENCOUNTER — Encounter: Payer: Self-pay | Admitting: Registered Nurse

## 2024-04-17 ENCOUNTER — Encounter: Attending: Registered Nurse | Admitting: Registered Nurse

## 2024-04-17 VITALS — BP 112/70 | HR 71 | Resp 16 | Ht 62.0 in | Wt 206.0 lb

## 2024-04-17 DIAGNOSIS — M1712 Unilateral primary osteoarthritis, left knee: Secondary | ICD-10-CM | POA: Diagnosis not present

## 2024-04-17 DIAGNOSIS — Z5181 Encounter for therapeutic drug level monitoring: Secondary | ICD-10-CM | POA: Diagnosis not present

## 2024-04-17 DIAGNOSIS — Z79891 Long term (current) use of opiate analgesic: Secondary | ICD-10-CM | POA: Diagnosis not present

## 2024-04-17 DIAGNOSIS — M5416 Radiculopathy, lumbar region: Secondary | ICD-10-CM | POA: Insufficient documentation

## 2024-04-17 DIAGNOSIS — G894 Chronic pain syndrome: Secondary | ICD-10-CM | POA: Diagnosis not present

## 2024-04-17 DIAGNOSIS — M170 Bilateral primary osteoarthritis of knee: Secondary | ICD-10-CM | POA: Diagnosis not present

## 2024-04-17 DIAGNOSIS — M1711 Unilateral primary osteoarthritis, right knee: Secondary | ICD-10-CM | POA: Insufficient documentation

## 2024-04-17 MED ORDER — OXYCODONE-ACETAMINOPHEN 7.5-325 MG PO TABS: 1.0000 | ORAL_TABLET | Freq: Four times a day (QID) | ORAL | 0 refills | Status: DC | PRN

## 2024-04-17 NOTE — Patient Instructions (Signed)
 My- Chart:   [phone]

## 2024-04-17 NOTE — Progress Notes (Signed)
 Subjective:    Patient ID: Hayley Padilla, female    DOB: 1951/04/25, 73 y.o.   MRN: [id number]  HPI: Hayley Padilla is a 73 y.o. female who returns for follow up appointment for chronic pain and medication refill. She states her pain is located in her lower back. She rates her pain 10. Her current exercise regime is walking  short distances with her walker.   Hayley Padilla Morphine  equivalent is 45.00 MME.   Last UDS was Performed on 12/23/2023, it was consistent.       Pain Inventory Average Pain 8 Pain Right Now 10 My pain is constant, tingling, and aching  In the last 24 hours, has pain interfered with the following? General activity 10 Relation with others 10 Enjoyment of life 10 What TIME of day is your pain at its worst? daytime Sleep (in general) Fair  Pain is worse with: walking, bending, standing, and some activites Pain improves with: medication Relief from Meds: 8  Family History  Problem Relation Age of Onset   Colon cancer Neg Hx    Colon polyps Neg Hx    Esophageal cancer Neg Hx    Rectal cancer Neg Hx    Stomach cancer Neg Hx    Social History   Socioeconomic History   Marital status: Widowed    Spouse name: Not on file   Number of children: Not on file   Years of education: Not on file   Highest education level: Not on file  Occupational History   Not on file  Tobacco Use   Smoking status: Never   Smokeless tobacco: Current    Types: Chew   Tobacco comments:    last used 03/17/2020 @ 2200  Vaping Use   Vaping status: Never Used  Substance and Sexual Activity   Alcohol use: Not Currently   Drug use: Never   Sexual activity: Not on file  Other Topics Concern   Not on file  Social History Narrative   Not on file   Social Drivers of Health   Financial Resource Strain: Low Risk  (03/27/2024)   Overall Financial Resource Strain (CARDIA)    Difficulty of Paying Living Expenses: Not hard at all  Food Insecurity: No Food Insecurity (03/27/2024)    Hunger Vital Sign    Worried About Running Out of Food in the Last Year: Never true    Ran Out of Food in the Last Year: Never true  Transportation Needs: No Transportation Needs (03/27/2024)   PRAPARE - Administrator, Civil Service (Medical): No    Lack of Transportation (Non-Medical): No  Physical Activity: Inactive (03/27/2024)   Exercise Vital Sign    Days of Exercise per Week: 0 days    Minutes of Exercise per Session: 0 min  Stress: No Stress Concern Present (03/27/2024)   [employer] of Occupational Health - Occupational Stress Questionnaire    Feeling of Stress : Only a little  Social Connections: Moderately Isolated (03/27/2024)   Social Connection and Isolation Panel [NHANES]    Frequency of Communication with Friends and Family: More than three times a week    Frequency of Social Gatherings with Friends and Family: Twice a week    Attends Religious Services: More than 4 times per year    Active Member of [employer] or Organizations: No    Attends Banker Meetings: Never    Marital Status: Widowed   Past Surgical History:  Procedure Laterality Date  BACK SURGERY     lower back   IR LUMBAR DISC ASPIRATION W/IMG GUIDE  01/15/2022   LAMINECTOMY WITH POSTERIOR LATERAL ARTHRODESIS LEVEL 1 N/A 03/16/2022   Procedure: Lumbar One-Two Posterior Lateral Fusion with pedicle screws;  Surgeon: Agustina Aldrich, MD;  Location[facility];  Service: Neurosurgery;  Laterality: N/A;   MULTIPLE TOOTH EXTRACTIONS     Past Surgical History:  Procedure Laterality Date   BACK SURGERY     lower back   IR LUMBAR DISC ASPIRATION W/IMG GUIDE  01/15/2022   LAMINECTOMY WITH POSTERIOR LATERAL ARTHRODESIS LEVEL 1 N/A 03/16/2022   Procedure: Lumbar One-Two Posterior Lateral Fusion with pedicle screws;  Surgeon: Agustina Aldrich, MD;  Location: [facility] OR;  Service: Neurosurgery;  Laterality: N/A;   MULTIPLE TOOTH EXTRACTIONS     Past Medical History:  Diagnosis Date   Allergy    Arthritis     Diabetes mellitus without complication (HCC)    Hyperlipidemia    Hypertension    PONV (postoperative nausea and vomiting)    BP 112/70   Pulse 71   Resp 16   Ht 5\' 2"  (1.575 m)   Wt 206 lb (93.4 kg)   SpO2 98%   BMI 37.68 kg/m   Opioid Risk Score:   Fall Risk Score:  `1  Depression screen PHQ 2/9     03/27/2024    1:24 PM 02/17/2024    2:41 PM 01/17/2024    1:13 PM 12/23/2023   10:03 AM 10/20/2023    1:07 PM 10/18/2023    2:58 PM 09/24/2023    1:44 PM  Depression screen PHQ 2/9  Decreased Interest 0 0 0 0 0 0 0  Down, Depressed, Hopeless 0 0 0 0 0  0  PHQ - 2 Score 0 0 0 0 0 0 0     Review of Systems  Musculoskeletal:        Right foot Right knee Lower back        Objective:   Physical Exam Vitals and nursing note reviewed.  Constitutional:      Appearance: Normal appearance.  Cardiovascular:     Rate and Rhythm: Normal rate and regular rhythm.     Pulses: Normal pulses.     Heart sounds: Normal heart sounds.  Pulmonary:     Effort: Pulmonary effort is normal.     Breath sounds: Normal breath sounds.  Musculoskeletal:     Comments: Normal Muscle Bulk and Muscle Testing Reveals:  Upper Extremities: Right: Full ROM and Muscle Strength  5/5 Left Upper Extremity: Decreased ROM 45 Degrees and Muscle Strength 5/5 Lower Extremities: Decreased ROM and Muscle Strength 5/5 Bilateral Lower Extremities Flexion Produces Pain into her Bilateral Patella's Arises from chair slowly Antalgic Gait     Skin:    General: Skin is warm and dry.  Neurological:     Mental Status: She is alert and oriented to person, place, and time.  Psychiatric:        Mood and Affect: Mood normal.        Behavior: Behavior normal.         Assessment & Plan:  Lumbar Post Laminectomy Syndrome: Hayley Padilla underwent on 03/17/2023 : Dr Adonis Alamin : Lumbar One-Two Posterior Lateral Fusion with pedicle screws  Continue HEP as Tolerated. Continue to Monitor. 04/17/2024 2. Lumbar Radiculitis: Continue  Gabapentin . Continue to monitor. 04/17/2024 3. Chronic Bilateral Hip Pain: No complaints today. Continue HEP as Tolerated. Continue to Monitor. 04/17/2024 4. Chronic Pain of Bilateral Knees:  R>LContinue HEP as Tolerated. Continue to Monitor. 04/17/2024 5. Chronic Pain Syndrome: Refilled: Oxycodone  7.5/325 mg one tablet 4 times a day as needed for pain #120.  We will continue the opioid monitoring program, this consists of regular clinic visits, examinations, urine drug screen, pill counts as well as use of [place]  Controlled Substance Reporting system. A 12 month History has been reviewed on the [place]  Controlled Substance Reporting System on 04/17/2024. 6, Left Shoulder pain: No complaints today. Continue to monitor.  04/17/2024   F/U in 1 month

## 2024-04-19 ENCOUNTER — Telehealth: Payer: Self-pay

## 2024-04-19 NOTE — Telephone Encounter (Signed)
 Patient called stating that she prefer having a standing walker. She believes that will work best for her.

## 2024-04-20 ENCOUNTER — Other Ambulatory Visit: Payer: Self-pay | Admitting: Family

## 2024-04-20 NOTE — Telephone Encounter (Unsigned)
 Copied from CRM [id number]. Topic: Clinical - Medication Refill >> Apr 20, 2024  3:13 PM [place]ge Fail S wrote: Medication:  gabapentin  (NEURONTIN ) 300 MG capsule  diclofenac  (VOLTAREN ) 75 MG EC tablet   Has the patient contacted their pharmacy? Yes (Agent: If no, request that the patient contact the pharmacy for the refill. If patient does not wish to contact the pharmacy document the reason why and proceed with request.) (Agent: If yes, when and what did the pharmacy advise?)  This is the patient's preferred pharmacy:  [facility] [address], [place] - [address] [place] [address] Phone: [phone] Fax: [phone]  Is this the correct pharmacy for this prescription? Yes If no, delete pharmacy and type the correct one.   Has the prescription been filled recently? No  Is the patient out of the medication? Yes  Has the patient been seen for an appointment in the last year OR does the patient have an upcoming appointment? Yes  Can we respond through MyChart? Yes  Agent: Please be advised that Rx refills may take up to 3 business days. We ask that you follow-up with your pharmacy.

## 2024-04-24 ENCOUNTER — Telehealth: Payer: Self-pay | Admitting: Registered Nurse

## 2024-04-24 MED ORDER — GABAPENTIN 300 MG PO CAPS: 300.0000 mg | ORAL_CAPSULE | Freq: Three times a day (TID) | ORAL | 0 refills | Status: DC

## 2024-04-24 NOTE — Telephone Encounter (Signed)
 Patient is scheduled

## 2024-04-24 NOTE — Telephone Encounter (Signed)
-   Diclofenac  appears as historical medication. Schedule appointment. - Gabapentin  prescribed 04/24/2024.

## 2024-04-24 NOTE — Telephone Encounter (Signed)
 Call placed to Hayley Padilla. Regarding walker Order will be faxed to [facility] for : Rollator.  Hayley Padilla understanding.

## 2024-04-24 NOTE — Telephone Encounter (Signed)
 Requested Prescriptions  Pending Prescriptions Disp Refills   diclofenac  (VOLTAREN ) 75 MG EC tablet      Sig: Take 1 tablet (75 mg total) by mouth 2 (two) times daily.     Analgesics:  NSAIDS Failed - 04/24/2024 10:38 AM      Failed - Manual Review: Labs are only required if the patient has taken medication for more than 8 weeks.      Failed - HGB in normal range and within 360 days    Hemoglobin  Date Value Ref Range Status  03/27/2024 10.3 (L) 11.1 - 15.9 g/dL Final         Passed - Cr in normal range and within 360 days    Creat  Date Value Ref Range Status  03/10/2022 0.63 0.60 - 1.00 mg/dL Final   Creatinine, Ser  Date Value Ref Range Status  03/27/2024 0.58 0.57 - 1.00 mg/dL Final         Passed - PLT in normal range and within 360 days    Platelets  Date Value Ref Range Status  03/27/2024 319 150 - 450 x10E3/uL Final         Passed - HCT in normal range and within 360 days    Hematocrit  Date Value Ref Range Status  03/27/2024 34.0 34.0 - 46.6 % Final         Passed - eGFR is 30 or above and within 360 days    GFR, Est African American  Date Value Ref Range Status  01/21/2021 102 > OR = 60 mL/min/1.23m2 Final   GFR, Est Non African American  Date Value Ref Range Status  01/21/2021 88 > OR = 60 mL/min/1.55m2 Final   GFR, Estimated  Date Value Ref Range Status  03/16/2022 >60 >60 mL/min Final    Comment:    (NOTE) Calculated using the CKD-EPI Creatinine Equation (2021)    eGFR  Date Value Ref Range Status  03/27/2024 95 >59 mL/min/1.73 Final         Passed - Patient is not pregnant      Passed - Valid encounter within last 12 months    Recent Outpatient Visits           4 weeks ago Annual physical exam   [place] [facility], Amy J, NP   1 month ago Sinusitis, unspecified chronicity, unspecified location   [facility], Amy J, NP   4 months ago Primary hypertension   [facility], Amy J, NP   7 months ago Primary hypertension   [place] [facility] at [facility], Amy J, NP   8 months ago Type 2 diabetes mellitus with hyperglycemia, without long-term current use of insulin (HCC)   [place] [facility] at [facility], Amy J, NP               gabapentin  (NEURONTIN ) 300 MG capsule 270 capsule 0    Sig: Take 1 capsule (300 mg total) by mouth 3 (three) times daily.     Neurology: Anticonvulsants - gabapentin  Passed - 04/24/2024 10:38 AM      Passed - Cr in normal range and within 360 days    Creat  Date Value Ref Range Status  03/10/2022 0.63 0.60 - 1.00 mg/dL Final   Creatinine, Ser  Date Value Ref Range Status  03/27/2024 0.58 0.57 - 1.00 mg/dL Final  Passed - Completed PHQ-2 or PHQ-9 in the last 360 days      Passed - Valid encounter within last 12 months    Recent Outpatient Visits           4 weeks ago Annual physical exam   [place] [facility], Amy J, NP   1 month ago Sinusitis, unspecified chronicity, unspecified location   [facility], Amy J, NP   4 months ago Primary hypertension   [place] [facility] at [facility], Amy J, NP   7 months ago Primary hypertension   [place] [facility] at [facility], Amy J, NP   8 months ago Type 2 diabetes mellitus with hyperglycemia, without long-term current use of insulin [facility])   [place] [facility] at [facility], Annalee Barren, NP

## 2024-04-24 NOTE — Telephone Encounter (Signed)
 Requested medication (s) are due for refill today unsure  Requested medication (s) are on the active medication list -yes  Future visit scheduled -yes  Last refill: 01/11/24  Notes to clinic: listed as historical medication, fails lab protocol- abn Hgb  Requested Prescriptions  Pending Prescriptions Disp Refills   diclofenac  (VOLTAREN ) 75 MG EC tablet      Sig: Take 1 tablet (75 mg total) by mouth 2 (two) times daily.     Analgesics:  NSAIDS Failed - 04/24/2024 10:39 AM      Failed - Manual Review: Labs are only required if the patient has taken medication for more than 8 weeks.      Failed - HGB in normal range and within 360 days    Hemoglobin  Date Value Ref Range Status  03/27/2024 10.3 (L) 11.1 - 15.9 g/dL Final         Passed - Cr in normal range and within 360 days    Creat  Date Value Ref Range Status  03/10/2022 0.63 0.60 - 1.00 mg/dL Final   Creatinine, Ser  Date Value Ref Range Status  03/27/2024 0.58 0.57 - 1.00 mg/dL Final         Passed - PLT in normal range and within 360 days    Platelets  Date Value Ref Range Status  03/27/2024 319 150 - 450 x10E3/uL Final         Passed - HCT in normal range and within 360 days    Hematocrit  Date Value Ref Range Status  03/27/2024 34.0 34.0 - 46.6 % Final         Passed - eGFR is 30 or above and within 360 days    GFR, Est African American  Date Value Ref Range Status  01/21/2021 102 > OR = 60 mL/min/1.29m2 Final   GFR, Est Non African American  Date Value Ref Range Status  01/21/2021 88 > OR = 60 mL/min/1.32m2 Final   GFR, Estimated  Date Value Ref Range Status  03/16/2022 >60 >60 mL/min Final    Comment:    (NOTE) Calculated using the CKD-EPI Creatinine Equation (2021)    eGFR  Date Value Ref Range Status  03/27/2024 95 >59 mL/min/1.73 Final         Passed - Patient is not pregnant      Passed - Valid encounter within last 12 months    Recent Outpatient Visits           4 weeks ago Annual  physical exam   [place] [facility], Amy J, NP   1 month ago Sinusitis, unspecified chronicity, unspecified location   [facility], Amy J, NP   4 months ago Primary hypertension   [place] [facility] at [facility], Amy J, NP   7 months ago Primary hypertension   [place] [facility] at [facility], Amy J, NP   8 months ago Type 2 diabetes mellitus with hyperglycemia, without long-term current use of insulin [facility])   [place] [facility] at [facility], [place], NP              Signed Prescriptions Disp Refills   gabapentin  (NEURONTIN ) 300 MG capsule 270 capsule 0    Sig: Take 1 capsule (300 mg total) by mouth 3 (three) times daily.     Neurology: Anticonvulsants - gabapentin  Passed - 04/24/2024 10:39 AM  Passed - Cr in normal range and within 360 days    Creat  Date Value Ref Range Status  03/10/2022 0.63 0.60 - 1.00 mg/dL Final   Creatinine, Ser  Date Value Ref Range Status  03/27/2024 0.58 0.57 - 1.00 mg/dL Final         Passed - Completed PHQ-2 or PHQ-9 in the last 360 days      Passed - Valid encounter within last 12 months    Recent Outpatient Visits           4 weeks ago Annual physical exam   [place] [facility], Amy J, NP   1 month ago Sinusitis, unspecified chronicity, unspecified location   [facility], Amy J, NP   4 months ago Primary hypertension   [place] [facility], Amy J, NP   7 months ago Primary hypertension   [place] [facility] at [facility], Amy J, NP   8 months ago Type 2 diabetes mellitus with hyperglycemia, without long-term current use of insulin [facility])   [place] [facility] at [facility], Amy J, NP                 Requested Prescriptions  Pending Prescriptions Disp Refills    diclofenac  (VOLTAREN ) 75 MG EC tablet      Sig: Take 1 tablet (75 mg total) by mouth 2 (two) times daily.     Analgesics:  NSAIDS Failed - 04/24/2024 10:39 AM      Failed - Manual Review: Labs are only required if the patient has taken medication for more than 8 weeks.      Failed - HGB in normal range and within 360 days    Hemoglobin  Date Value Ref Range Status  03/27/2024 10.3 (L) 11.1 - 15.9 g/dL Final         Passed - Cr in normal range and within 360 days    Creat  Date Value Ref Range Status  03/10/2022 0.63 0.60 - 1.00 mg/dL Final   Creatinine, Ser  Date Value Ref Range Status  03/27/2024 0.58 0.57 - 1.00 mg/dL Final         Passed - PLT in normal range and within 360 days    Platelets  Date Value Ref Range Status  03/27/2024 319 150 - 450 x10E3/uL Final         Passed - HCT in normal range and within 360 days    Hematocrit  Date Value Ref Range Status  03/27/2024 34.0 34.0 - 46.6 % Final         Passed - eGFR is 30 or above and within 360 days    GFR, Est African American  Date Value Ref Range Status  01/21/2021 102 > OR = 60 mL/min/1.79m2 Final   GFR, Est Non African American  Date Value Ref Range Status  01/21/2021 88 > OR = 60 mL/min/1.86m2 Final   GFR, Estimated  Date Value Ref Range Status  03/16/2022 >60 >60 mL/min Final    Comment:    (NOTE) Calculated using the CKD-EPI Creatinine Equation (2021)    eGFR  Date Value Ref Range Status  03/27/2024 95 >59 mL/min/1.73 Final         Passed - Patient is not pregnant      Passed - Valid encounter within last 12 months    Recent Outpatient Visits  4 weeks ago Annual physical exam   [place] [facility], Amy J, NP   1 month ago Sinusitis, unspecified chronicity, unspecified location   [facility], Amy J, NP   4 months ago Primary hypertension   [place] [facility] at [facility], Amy J, NP   7  months ago Primary hypertension   [place] [facility] at [facility], Amy J, NP   8 months ago Type 2 diabetes mellitus with hyperglycemia, without long-term current use of insulin [facility])   [place] [facility] at [facility], [place], NP              Signed Prescriptions Disp Refills   gabapentin  (NEURONTIN ) 300 MG capsule 270 capsule 0    Sig: Take 1 capsule (300 mg total) by mouth 3 (three) times daily.     Neurology: Anticonvulsants - gabapentin  Passed - 04/24/2024 10:39 AM      Passed - Cr in normal range and within 360 days    Creat  Date Value Ref Range Status  03/10/2022 0.63 0.60 - 1.00 mg/dL Final   Creatinine, Ser  Date Value Ref Range Status  03/27/2024 0.58 0.57 - 1.00 mg/dL Final         Passed - Completed PHQ-2 or PHQ-9 in the last 360 days      Passed - Valid encounter within last 12 months    Recent Outpatient Visits           4 weeks ago Annual physical exam   [place] [facility], Amy J, NP   1 month ago Sinusitis, unspecified chronicity, unspecified location   [facility], Amy J, NP   4 months ago Primary hypertension   [place] [facility], Amy J, NP   7 months ago Primary hypertension   [place] [facility] at [facility], Amy J, NP   8 months ago Type 2 diabetes mellitus with hyperglycemia, without long-term current use of insulin [facility])    Primary Care at [facility], Annalee Barren, NP

## 2024-05-03 ENCOUNTER — Encounter: Payer: Self-pay | Admitting: Family

## 2024-05-03 ENCOUNTER — Ambulatory Visit (INDEPENDENT_AMBULATORY_CARE_PROVIDER_SITE_OTHER): Payer: Self-pay | Admitting: Family

## 2024-05-03 VITALS — BP 145/81 | HR 74 | Wt 204.4 lb

## 2024-05-03 DIAGNOSIS — E1165 Type 2 diabetes mellitus with hyperglycemia: Secondary | ICD-10-CM

## 2024-05-03 DIAGNOSIS — M25569 Pain in unspecified knee: Secondary | ICD-10-CM

## 2024-05-03 DIAGNOSIS — Z7984 Long term (current) use of oral hypoglycemic drugs: Secondary | ICD-10-CM

## 2024-05-03 DIAGNOSIS — G8929 Other chronic pain: Secondary | ICD-10-CM | POA: Diagnosis not present

## 2024-05-03 DIAGNOSIS — N898 Other specified noninflammatory disorders of vagina: Secondary | ICD-10-CM

## 2024-05-03 DIAGNOSIS — I10 Essential (primary) hypertension: Secondary | ICD-10-CM

## 2024-05-03 DIAGNOSIS — E559 Vitamin D deficiency, unspecified: Secondary | ICD-10-CM

## 2024-05-03 MED ORDER — VITAMIN D (ERGOCALCIFEROL) 1.25 MG (50000 UNIT) PO CAPS: 50000.0000 [IU] | ORAL_CAPSULE | ORAL | 0 refills | Status: AC

## 2024-05-03 MED ORDER — DICLOFENAC SODIUM 75 MG PO TBEC: 75.0000 mg | DELAYED_RELEASE_TABLET | Freq: Two times a day (BID) | ORAL | 0 refills | Status: DC

## 2024-05-03 NOTE — Progress Notes (Signed)
 Patient ID: DALISHA SHIVELY, female    DOB: 12-05-1951  MRN: [id number]  CC: Chronic Conditions Follow-Up  Subjective: Hayley Padilla is a 73 y.o. female who presents for chronic conditions follow-up.   Her concerns today include:  - Doing well on Amlodipine , Losartan , and Clonidine , no issues/concerns. States she noticed pharmacy gave her "a different looking Amlodipine " than she usually gets and she just started taking it yesterday. She does not complain of red flag symptoms such as but not limited to chest pain, shortness of breath, worst headache of life, nausea/vomiting.  - Doing well on Sitagliptin , no issues/concerns. Denies red flag symptoms associated with diabetes.  - Needs refills of Diclofenac  for chronic knee pain. Denies recent trauma/injury and red flag symptoms. - Needs refills of Vitamin D .  - States she never received call from Gynecology.   Patient Active Problem List   Diagnosis Date Noted   Diabetes mellitus, type 2 (HCC) 06/24/2023   Vertebral osteomyelitis (HCC) 03/12/2022   Hypokalemia 03/12/2022   Edema of right lower extremity 01/15/2022   Essential hypertension 01/14/2022   Chronic pain 01/14/2022   HLD (hyperlipidemia) 01/14/2022   Discitis 01/14/2022   Obesity (BMI 30-39.9) 01/13/2022   S/P lumbar spinal fusion 01/13/2022   Discitis of lumbar region 01/13/2022   Kyphosis 01/13/2022   Degenerative spondylolisthesis 06/12/2019     Current Outpatient Medications on File Prior to Visit  Medication Sig Dispense Refill   acetaminophen  (TYLENOL ) 650 MG CR tablet Take 650 mg by mouth every 8 (eight) hours as needed for pain.     amLODipine  (NORVASC ) 10 MG tablet Take 1 tablet (10 mg total) by mouth daily. 90 tablet 0   azithromycin  (ZITHROMAX  Z-PAK) 250 MG tablet Take 2 tablets (500 mg) on  Day 1,  followed by 1 tablet (250 mg) once daily on Days 2 through 5. 6 each 0   benzonatate  (TESSALON ) 100 MG capsule Take 1 capsule (100 mg total) by mouth 3 (three)  times daily as needed for cough. 30 capsule 0   buprenorphine  (BUTRANS ) 10 MCG/HR PTWK      cloNIDine  (CATAPRES ) 0.1 MG tablet Take 1 tablet (0.1 mg total) by mouth 2 (two) times daily. 180 tablet 0   Continuous Glucose Receiver (DEXCOM G6 RECEIVER) DEVI 1 each by Other route 4 (four) times daily -  before meals and at bedtime. 1 each 2   Continuous Glucose Receiver (FREESTYLE LIBRE 2 READER) DEVI 1 each by Other route 4 (four) times daily -  before meals and at bedtime. 1 each 2   Continuous Glucose Sensor (DEXCOM G6 SENSOR) MISC 1 each by Other route 4 (four) times daily -  before meals and at bedtime. 3 each 2   Continuous Glucose Sensor (FREESTYLE LIBRE 2 PLUS SENSOR) MISC 1 each by Other route 4 (four) times daily -  before meals and at bedtime. 1 each 2   Continuous Glucose Transmitter (DEXCOM G6 TRANSMITTER) MISC 1 each by Other route 4 (four) times daily -  before meals and at bedtime. 1 each 2   furosemide  (LASIX ) 40 MG tablet Take 40 mg by mouth daily.     gabapentin  (NEURONTIN ) 300 MG capsule Take 1 capsule (300 mg total) by mouth 3 (three) times daily. 270 capsule 0   guaiFENesin  200 MG tablet Take 1 tablet (200 mg total) by mouth every 4 (four) hours as needed for cough or to loosen phlegm. 30 tablet 1   Iron , Ferrous Sulfate , 325 (65 Fe) MG  TABS Take 325 mg by mouth daily. 90 tablet 0   losartan  (COZAAR ) 100 MG tablet Take 1 tablet (100 mg total) by mouth daily. 90 tablet 0   lovastatin  (MEVACOR ) 40 MG tablet Take 1 tablet (40 mg total) by mouth at bedtime. 90 tablet 0   metFORMIN  (GLUCOPHAGE -XR) 500 MG 24 hr tablet TAKE 1 TABLET(500 MG) BY MOUTH DAILY WITH BREAKFAST 90 tablet 0   oxyCODONE -acetaminophen  (PERCOCET) 7.5-325 MG tablet Take 1 tablet by mouth 4 (four) times daily as needed. 120 tablet 0   polyethylene glycol (MIRALAX  / GLYCOLAX ) 17 g packet Take 17 g by mouth daily as needed. 90 each 0   senna-docusate (SENOKOT-S) 8.6-50 MG tablet Take 1 tablet by mouth at bedtime.      sitaGLIPtin  (JANUVIA ) 25 MG tablet Take 1 tablet (25 mg total) by mouth daily. 90 tablet 0   Vitamin D , Ergocalciferol , (DRISDOL) 1.25 MG (50000 UNIT) CAPS capsule Take 50,000 Units by mouth every Monday.     potassium chloride  (KLOR-CON ) 10 MEQ tablet Take 1 tablet (10 mEq total) by mouth daily. 90 tablet 0   No current facility-administered medications on file prior to visit.    Allergies  Allergen Reactions   Penicillins Anaphylaxis    Did it involve swelling of the face/tongue/throat, SOB, or low BP? Yes Did it involve sudden or severe rash/hives, skin peeling, or any reaction on the inside of your mouth or nose? No Did you need to seek medical attention at a hospital or doctor's office? Yes When did it last happen?      "Been a While"  If all above answers are "NO", may proceed with cephalosporin use.    Aspirin Other (See Comments)    brusing _ patient requested to be listed    Social History   Socioeconomic History   Marital status: Widowed    Spouse name: Not on file   Number of children: Not on file   Years of education: Not on file   Highest education level: Not on file  Occupational History   Not on file  Tobacco Use   Smoking status: Never   Smokeless tobacco: Current    Types: Chew   Tobacco comments:    last used 03/17/2020 @ 2200  Vaping Use   Vaping status: Never Used  Substance and Sexual Activity   Alcohol use: Not Currently   Drug use: Never   Sexual activity: Not on file  Other Topics Concern   Not on file  Social History Narrative   Not on file   Social Drivers of Health   Financial Resource Strain: Low Risk  (03/27/2024)   Overall Financial Resource Strain (CARDIA)    Difficulty of Paying Living Expenses: Not hard at all  Food Insecurity: No Food Insecurity (03/27/2024)   Hunger Vital Sign    Worried About Running Out of Food in the Last Year: Never true    Ran Out of Food in the Last Year: Never true  Transportation Needs: No Transportation  Needs (03/27/2024)   PRAPARE - Administrator, Civil Service (Medical): No    Lack of Transportation (Non-Medical): No  Physical Activity: Inactive (03/27/2024)   Exercise Vital Sign    Days of Exercise per Week: 0 days    Minutes of Exercise per Session: 0 min  Stress: No Stress Concern Present (03/27/2024)   [employer] of Occupational Health - Occupational Stress Questionnaire    Feeling of Stress : Only a little  Social  Connections: Moderately Isolated (03/27/2024)   Social Connection and Isolation Panel [NHANES]    Frequency of Communication with Friends and Family: More than three times a week    Frequency of Social Gatherings with Friends and Family: Twice a week    Attends Religious Services: More than 4 times per year    Active Member of [employer] or Organizations: No    Attends Banker Meetings: Never    Marital Status: Widowed  Intimate Partner Violence: Not At Risk (03/27/2024)   Humiliation, Afraid, Rape, and Kick questionnaire    Fear of Current or Ex-Partner: No    Emotionally Abused: No    Physically Abused: No    Sexually Abused: No    Family History  Problem Relation Age of Onset   Colon cancer Neg Hx    Colon polyps Neg Hx    Esophageal cancer Neg Hx    Rectal cancer Neg Hx    Stomach cancer Neg Hx     Past Surgical History:  Procedure Laterality Date   BACK SURGERY     lower back   IR LUMBAR DISC ASPIRATION W/IMG GUIDE  01/15/2022   LAMINECTOMY WITH POSTERIOR LATERAL ARTHRODESIS LEVEL 1 N/A 03/16/2022   Procedure: Lumbar One-Two Posterior Lateral Fusion with pedicle screws;  Surgeon: Agustina Aldrich, MD;  Location: [facility] OR;  Service: Neurosurgery;  Laterality: N/A;   MULTIPLE TOOTH EXTRACTIONS      ROS: Review of Systems Negative except as stated above  PHYSICAL EXAM: BP (!) 145/81 (BP Location: Right Arm, Patient Position: Sitting, Cuff Size: Large)   Pulse 74   Wt 204 lb 6.4 oz (92.7 kg)   SpO2 96%   BMI 37.39 kg/m   Physical  Exam HENT:     Head: Normocephalic and atraumatic.     Nose: Nose normal.     Mouth/Throat:     Mouth: Mucous membranes are moist.     Pharynx: Oropharynx is clear.  Eyes:     Extraocular Movements: Extraocular movements intact.     Conjunctiva/sclera: Conjunctivae normal.     Pupils: Pupils are equal, round, and reactive to light.  Cardiovascular:     Rate and Rhythm: Normal rate and regular rhythm.     Pulses: Normal pulses.     Heart sounds: Normal heart sounds.  Pulmonary:     Effort: Pulmonary effort is normal.     Breath sounds: Normal breath sounds.  Musculoskeletal:        General: Normal range of motion.     Cervical back: Normal range of motion and neck supple.  Neurological:     General: No focal deficit present.     Mental Status: She is alert and oriented to person, place, and time.  Psychiatric:        Mood and Affect: Mood normal.        Behavior: Behavior normal.     ASSESSMENT AND PLAN: 1. Primary hypertension (Primary) - Blood pressure not at goal during today's visit. Patient asymptomatic without chest pressure, chest pain, palpitations, shortness of breath, worst headache of life, and any additional red flag symptoms. - Continue Amlodipine , Losartan , and Clonidine  as prescribed. No refills needed as of present.  - Counseled on blood pressure goal of less than 130/80, low-sodium, DASH diet, medication compliance, and 150 minutes of moderate intensity exercise per week as tolerated. Counseled on medication adherence and adverse effects. - Follow-up with primary provider in 4 weeks or sooner if needed.   2. Type 2  diabetes mellitus with hyperglycemia, without long-term current use of insulin (HCC) - Hemoglobin A1c at goal 6.1% on 03/27/2024. Goal 7%. - Continue Sitagliptin  as prescribed. No refills needed as of present.  - Patient intolerant to Metformin .  - Discussed the importance of healthy eating habits, low-carbohydrate diet, low-sugar diet, regular  aerobic exercise (at least 150 minutes a week as tolerated) and medication compliance to achieve or maintain control of diabetes. Counseled on medication adherence/adverse effects.  - Follow-up with primary provider in 2 months or sooner if needed.  3. Chronic knee pain, unspecified laterality - Continue Diclofenac  as prescribed. Counseled on medication adherence/adverse effects.  - Follow-up with primary provider as scheduled. - diclofenac  (VOLTAREN ) 75 MG EC tablet; Take 1 tablet (75 mg total) by mouth 2 (two) times daily.  Dispense: 180 tablet; Refill: 0  4. Vitamin D  deficiency - Vitamin D , Ergocalciferol  as prescribed. Counseled on medication adherence/adverse effects. - Follow-up with primary provider in 12 weeks or sooner if needed. - Vitamin D , Ergocalciferol , (DRISDOL) 1.25 MG (50000 UNIT) CAPS capsule; Take 1 capsule (50,000 Units total) by mouth every 7 (seven) days for 12 doses.  Dispense: 12 capsule; Refill: 0  5. Vaginal odor - Referral to Gynecology for evaluation/management. - Ambulatory referral to Gynecology   Patient was given the opportunity to ask questions.  Patient verbalized understanding of the plan and was able to repeat key elements of the plan. Patient was given clear instructions to go to Emergency Department or return to medical center if symptoms don't improve, worsen, or new problems develop.The patient verbalized understanding.   Orders Placed This Encounter  Procedures   Ambulatory referral to Gynecology     Requested Prescriptions   Signed Prescriptions Disp Refills   diclofenac  (VOLTAREN ) 75 MG EC tablet 180 tablet 0    Sig: Take 1 tablet (75 mg total) by mouth 2 (two) times daily.   Vitamin D , Ergocalciferol , (DRISDOL) 1.25 MG (50000 UNIT) CAPS capsule 12 capsule 0    Sig: Take 1 capsule (50,000 Units total) by mouth every 7 (seven) days for 12 doses.    Return in about 4 weeks (around 05/31/2024) for Follow-Up or next available chronic  conditions.  Senaida Dama, NP

## 2024-05-03 NOTE — Patient Instructions (Addendum)
 Referral information for gynecology:  [facility] Address: [address] [place], [place] [address] Phone number:[phone] Fax number:[phone]

## 2024-05-16 ENCOUNTER — Encounter: Attending: Registered Nurse | Admitting: Registered Nurse

## 2024-05-16 VITALS — BP 124/74 | HR 66 | Ht 62.0 in | Wt 208.0 lb

## 2024-05-16 DIAGNOSIS — Z79891 Long term (current) use of opiate analgesic: Secondary | ICD-10-CM | POA: Diagnosis not present

## 2024-05-16 DIAGNOSIS — M5416 Radiculopathy, lumbar region: Secondary | ICD-10-CM | POA: Insufficient documentation

## 2024-05-16 DIAGNOSIS — G894 Chronic pain syndrome: Secondary | ICD-10-CM | POA: Diagnosis not present

## 2024-05-16 DIAGNOSIS — Z5181 Encounter for therapeutic drug level monitoring: Secondary | ICD-10-CM | POA: Insufficient documentation

## 2024-05-16 MED ORDER — OXYCODONE-ACETAMINOPHEN 7.5-325 MG PO TABS: 1.0000 | ORAL_TABLET | Freq: Four times a day (QID) | ORAL | 0 refills | Status: DC | PRN

## 2024-05-16 NOTE — Progress Notes (Signed)
 Subjective:    Patient ID: Hayley Padilla, female    DOB: Apr 25, 1951, 73 y.o.   MRN: [id number]  HPI: Hayley Padilla is a 73 y.o. female who returns for follow up appointment for chronic pain and medication refill. She states her  pain is located in her lower back radiating into her right lower extremity and his bilateral ankle pain. She rates her pain 10. Her current exercise regime is walking and performing stretching exercises.  Ms. Swim Morphine  equivalent is 45.00 MME.   UDS ordered Today.     Pain Inventory Average Pain 7 Pain Right Now 10 My pain is dull  In the last 24 hours, has pain interfered with the following? General activity 5 Relation with others 0 Enjoyment of life 0 What TIME of day is your pain at its worst? night Sleep (in general) Fair  Pain is worse with: walking, bending, and standing Pain improves with: heat/ice and medication Relief from Meds: 4  Family History  Problem Relation Age of Onset   Colon cancer Neg Hx    Colon polyps Neg Hx    Esophageal cancer Neg Hx    Rectal cancer Neg Hx    Stomach cancer Neg Hx    Social History   Socioeconomic History   Marital status: Widowed    Spouse name: Not on file   Number of children: Not on file   Years of education: Not on file   Highest education level: Not on file  Occupational History   Not on file  Tobacco Use   Smoking status: Never   Smokeless tobacco: Current    Types: Chew   Tobacco comments:    last used 03/17/2020 @ 2200  Vaping Use   Vaping status: Never Used  Substance and Sexual Activity   Alcohol use: Not Currently   Drug use: Never   Sexual activity: Not on file  Other Topics Concern   Not on file  Social History Narrative   Not on file   Social Drivers of Health   Financial Resource Strain: Low Risk  (03/27/2024)   Overall Financial Resource Strain (CARDIA)    Difficulty of Paying Living Expenses: Not hard at all  Food Insecurity: No Food Insecurity (03/27/2024)    Hunger Vital Sign    Worried About Running Out of Food in the Last Year: Never true    Ran Out of Food in the Last Year: Never true  Transportation Needs: No Transportation Needs (03/27/2024)   PRAPARE - Administrator, Civil Service (Medical): No    Lack of Transportation (Non-Medical): No  Physical Activity: Inactive (03/27/2024)   Exercise Vital Sign    Days of Exercise per Week: 0 days    Minutes of Exercise per Session: 0 min  Stress: No Stress Concern Present (03/27/2024)   [employer] of Occupational Health - Occupational Stress Questionnaire    Feeling of Stress : Only a little  Social Connections: Moderately Isolated (03/27/2024)   Social Connection and Isolation Panel [NHANES]    Frequency of Communication with Friends and Family: More than three times a week    Frequency of Social Gatherings with Friends and Family: Twice a week    Attends Religious Services: More than 4 times per year    Active Member of [employer] or Organizations: No    Attends Banker Meetings: Never    Marital Status: Widowed   Past Surgical History:  Procedure Laterality Date   BACK SURGERY  lower back   IR LUMBAR DISC ASPIRATION W/IMG GUIDE  01/15/2022   LAMINECTOMY WITH POSTERIOR LATERAL ARTHRODESIS LEVEL 1 N/A 03/16/2022   Procedure: Lumbar One-Two Posterior Lateral Fusion with pedicle screws;  Surgeon: Agustina Aldrich, MD;  Location: [facility] OR;  Service: Neurosurgery;  Laterality: N/A;   MULTIPLE TOOTH EXTRACTIONS     Past Surgical History:  Procedure Laterality Date   BACK SURGERY     lower back   IR LUMBAR DISC ASPIRATION W/IMG GUIDE  01/15/2022   LAMINECTOMY WITH POSTERIOR LATERAL ARTHRODESIS LEVEL 1 N/A 03/16/2022   Procedure: Lumbar One-Two Posterior Lateral Fusion with pedicle screws;  Surgeon: Agustina Aldrich, MD;  Location: [facility] OR;  Service: Neurosurgery;  Laterality: N/A;   MULTIPLE TOOTH EXTRACTIONS     Past Medical History:  Diagnosis Date   Allergy    Arthritis     Diabetes mellitus without complication (HCC)    Hyperlipidemia    Hypertension    PONV (postoperative nausea and vomiting)    BP 124/74   Pulse 66   Ht 5' 2 (1.575 m)   Wt 208 lb (94.3 kg)   SpO2 100%   BMI 38.04 kg/m   Opioid Risk Score:   Fall Risk Score:  `1  Depression screen PHQ 2/9     03/27/2024    1:24 PM 02/17/2024    2:41 PM 01/17/2024    1:13 PM 12/23/2023   10:03 AM 10/20/2023    1:07 PM 10/18/2023    2:58 PM 09/24/2023    1:44 PM  Depression screen PHQ 2/9  Decreased Interest 0 0 0 0 0 0 0  Down, Depressed, Hopeless 0 0 0 0 0  0  PHQ - 2 Score 0 0 0 0 0 0 0     Review of Systems  Musculoskeletal:  Positive for back pain.       Pain in left knee and foot  All other systems reviewed and are negative.     Objective:   Physical Exam Vitals and nursing note reviewed.  Constitutional:      Appearance: Normal appearance.   Cardiovascular:     Rate and Rhythm: Normal rate and regular rhythm.     Pulses: Normal pulses.     Heart sounds: Normal heart sounds.  Pulmonary:     Effort: Pulmonary effort is normal.     Breath sounds: Normal breath sounds.   Musculoskeletal:     Comments: Normal Muscle Bulk and Muscle Testing Reveals:  Upper Extremities: Full ROM and Muscle Strength 5/5  Lumbar Paraspinal Tenderness: L-4-L-5 Lower Extremities: Full ROM and Muscle Strength 5/5 Arises from Chair slowly using walker for support Antalgic  Gait      Skin:    General: Skin is warm and dry.   Neurological:     Mental Status: She is alert and oriented to person, place, and time.   Psychiatric:        Mood and Affect: Mood normal.        Behavior: Behavior normal.         Assessment & Plan:  Lumbar Post Laminectomy Syndrome: Ms. [place] underwent on 03/17/2023 : Dr Adonis Alamin : Lumbar One-Two Posterior Lateral Fusion with pedicle screws  Continue HEP as Tolerated. Continue to Monitor. 05/16/2024 2. Lumbar Radiculitis: Continue Gabapentin . Continue to monitor.  05/16/2024 3. Chronic Bilateral Hip Pain: No complaints today. Continue HEP as Tolerated. Continue to Monitor. 05/16/2024 4. Chronic Pain of Bilateral Knees: R>LContinue HEP as Tolerated. Continue to Monitor. 05/16/2024 5.  Chronic Pain Syndrome: Refilled: Oxycodone  7.5/325 mg one tablet 4 times a day as needed for pain #120.  We will continue the opioid monitoring program, this consists of regular clinic visits, examinations, urine drug screen, pill counts as well as use of [place]  Controlled Substance Reporting system. A 12 month History has been reviewed on the [place]  Controlled Substance Reporting System on 05/16/2024. 6, Left Shoulder pain: No complaints today. Continue to monitor.  05/16/2024 7. Bilateral Ankle Pain: Continue HEP as Tolerated. Continue to Monitor.  F/U in 1 month

## 2024-05-18 ENCOUNTER — Ambulatory Visit: Admitting: Registered Nurse

## 2024-05-19 ENCOUNTER — Other Ambulatory Visit: Payer: Self-pay | Admitting: Family

## 2024-05-19 DIAGNOSIS — I10 Essential (primary) hypertension: Secondary | ICD-10-CM

## 2024-05-24 ENCOUNTER — Other Ambulatory Visit: Payer: Self-pay | Admitting: Family

## 2024-05-24 ENCOUNTER — Telehealth: Payer: Self-pay | Admitting: Family

## 2024-05-24 DIAGNOSIS — H9209 Otalgia, unspecified ear: Secondary | ICD-10-CM

## 2024-05-24 NOTE — Telephone Encounter (Signed)
Pt is requesting referral for ENT. Please advise.

## 2024-05-24 NOTE — Telephone Encounter (Signed)
 Complete

## 2024-05-24 NOTE — Telephone Encounter (Signed)
 Copied from CRM [id number]. Topic: Referral - Status >> May 24, 2024 11:49 AM Hayley Padilla R wrote: Pt is calling to follow up on her ENT and GYN referral. Pt would like to know if she needs to call and sch or if someone will call her.

## 2024-05-24 NOTE — Telephone Encounter (Signed)
 Patient stated she has discussed she wanted a referral for ENT , can we send this in? Or will patient need appointment

## 2024-05-26 ENCOUNTER — Telehealth: Payer: Self-pay | Admitting: Family

## 2024-05-26 NOTE — Telephone Encounter (Signed)
 Copied from CRM [id number]. Topic: Referral - Question >> May 26, 2024  1:43 PM [employer] wrote: Reason for CRM: Patient would like ENT referral corrected. She states it is not for her ear but is for sinus.  Callback #: [id number]

## 2024-05-29 ENCOUNTER — Other Ambulatory Visit: Payer: Self-pay | Admitting: Family

## 2024-05-29 DIAGNOSIS — J329 Chronic sinusitis, unspecified: Secondary | ICD-10-CM

## 2024-05-29 NOTE — Telephone Encounter (Signed)
 I made patient aware PCP put the referral in and they will be calling her for an appointment

## 2024-05-29 NOTE — Telephone Encounter (Signed)
 ENT referral ordered on 05/29/24 0932.

## 2024-05-29 NOTE — Telephone Encounter (Signed)
 Complete

## 2024-05-30 ENCOUNTER — Encounter (INDEPENDENT_AMBULATORY_CARE_PROVIDER_SITE_OTHER): Payer: Self-pay

## 2024-05-30 ENCOUNTER — Ambulatory Visit (INDEPENDENT_AMBULATORY_CARE_PROVIDER_SITE_OTHER): Admitting: Podiatry

## 2024-05-30 DIAGNOSIS — M25571 Pain in right ankle and joints of right foot: Secondary | ICD-10-CM

## 2024-05-30 NOTE — Telephone Encounter (Signed)
 I called and made patient aware and also made her aware that her referral for GYN was put in on 05/03/2024 and I gave her their information to call.

## 2024-06-01 ENCOUNTER — Encounter: Payer: Self-pay | Admitting: Podiatry

## 2024-06-01 NOTE — Progress Notes (Signed)
  Subjective:  Patient ID: Hayley Padilla, female    DOB: Feb 22, 1951,  MRN: [id number]  Chief Complaint  Patient presents with   Foot Pain    RM#2 Follow up right foot pain at night worsening started when wearing her diabetic shoes.    73 y.o. female presents with the above complaint. History confirmed with patient.  Still having the same burning ankle pain no better  Objective:  Physical Exam: warm, good capillary refill, no trophic changes or ulcerative lesions, normal DP and PT pulses, normal sensory exam, and no varicosities noted, she has mild to moderate nonpitting edema around the bilateral ankle and sinus tarsi, some tenderness in the sinus tarsi to palpation.  Good smooth range of motion without pain.  No reproducible pain to palpation along the peroneal tendons the pain she reports is in the area of the common peroneal nerve.  Weakness in lateral and anterior compartment on right lower extremity.  2 cm leg length shorter on the right.  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  We discussed that there may be factors of neuritis and other issues at play here but she still has tenderness in the sinus tarsi less so in the anterior ankle joint.  I recommended corticosteroid injection.  Following consent and sterile prep with Betadine the right sinus tarsi and subtalar joint was injected with 20 mg Kenalog 4 mg dexamethasone  and 5 mg of Marcaine .  She tolerates well and was dressed with Band-Aid.  Follow-up with me as needed if does not improve  No follow-ups on file.

## 2024-06-06 ENCOUNTER — Encounter: Payer: Self-pay | Admitting: Family

## 2024-06-06 ENCOUNTER — Ambulatory Visit (INDEPENDENT_AMBULATORY_CARE_PROVIDER_SITE_OTHER): Admitting: Family

## 2024-06-06 VITALS — BP 146/83 | HR 85 | Temp 98.6°F | Resp 16 | Ht 62.0 in | Wt 201.8 lb

## 2024-06-06 DIAGNOSIS — F439 Reaction to severe stress, unspecified: Secondary | ICD-10-CM | POA: Diagnosis not present

## 2024-06-06 DIAGNOSIS — J329 Chronic sinusitis, unspecified: Secondary | ICD-10-CM | POA: Diagnosis not present

## 2024-06-06 DIAGNOSIS — I10 Essential (primary) hypertension: Secondary | ICD-10-CM | POA: Diagnosis not present

## 2024-06-06 MED ORDER — DOXYCYCLINE HYCLATE 100 MG PO TABS: 100.0000 mg | ORAL_TABLET | Freq: Two times a day (BID) | ORAL | 0 refills | Status: AC

## 2024-06-06 NOTE — Progress Notes (Signed)
 Patient ID: Hayley Padilla, female    DOB: [birth year]  MRN: [id number]  CC: Chronic Conditions Follow-Up  Subjective: Hayley Padilla is a 73 y.o. female who presents for chronic conditions follow-up.   Her concerns today include:  - States sinus congestion persisting and would like to a medication to help. Reports appointment with ENT in August 2025.  - Doing well on blood pressure medications, no issues/concerns.. States her blood pressure is high today due to her son-in-law was recently murdered while on vacation in [place] She denies thoughts of self-harm, suicidal ideations, homicidal ideations. - Upcoming appointment with Gynecology. - Patient states she confirmed with Purvis Pepper, CMA that medication refills are waiting at pharmacy for pickup.  Patient Active Problem List   Diagnosis Date Noted   Diabetes mellitus, type 2 (HCC) 06/24/2023   Vertebral osteomyelitis (HCC) 03/12/2022   Hypokalemia 03/12/2022   Edema of right lower extremity 01/15/2022   Essential hypertension 01/14/2022   Chronic pain 01/14/2022   HLD (hyperlipidemia) 01/14/2022   Discitis 01/14/2022   Obesity (BMI 30-39.9) 01/13/2022   S/P lumbar spinal fusion 01/13/2022   Discitis of lumbar region 01/13/2022   Kyphosis 01/13/2022   Degenerative spondylolisthesis 06/12/2019     Current Outpatient Medications on File Prior to Visit  Medication Sig Dispense Refill   acetaminophen  (TYLENOL ) 650 MG CR tablet Take 650 mg by mouth every 8 (eight) hours as needed for pain.     amLODipine  (NORVASC ) 10 MG tablet Take 1 tablet (10 mg total) by mouth daily. 90 tablet 0   benzonatate  (TESSALON ) 100 MG capsule Take 1 capsule (100 mg total) by mouth 3 (three) times daily as needed for cough. 30 capsule 0   buprenorphine  (BUTRANS ) 10 MCG/HR PTWK      cloNIDine  (CATAPRES ) 0.1 MG tablet TAKE 1 TABLET(0.1 MG) BY MOUTH TWICE DAILY 180 tablet 0   Continuous Glucose Receiver (DEXCOM G6 RECEIVER) DEVI 1 each by Other  route 4 (four) times daily -  before meals and at bedtime. 1 each 2   Continuous Glucose Receiver (FREESTYLE LIBRE 2 READER) DEVI 1 each by Other route 4 (four) times daily -  before meals and at bedtime. 1 each 2   Continuous Glucose Sensor (DEXCOM G6 SENSOR) MISC 1 each by Other route 4 (four) times daily -  before meals and at bedtime. 3 each 2   Continuous Glucose Sensor (FREESTYLE LIBRE 2 PLUS SENSOR) MISC 1 each by Other route 4 (four) times daily -  before meals and at bedtime. 1 each 2   Continuous Glucose Transmitter (DEXCOM G6 TRANSMITTER) MISC 1 each by Other route 4 (four) times daily -  before meals and at bedtime. 1 each 2   diclofenac  (VOLTAREN ) 75 MG EC tablet Take 1 tablet (75 mg total) by mouth 2 (two) times daily. 180 tablet 0   furosemide  (LASIX ) 40 MG tablet Take 40 mg by mouth daily.     gabapentin  (NEURONTIN ) 300 MG capsule Take 1 capsule (300 mg total) by mouth 3 (three) times daily. 270 capsule 0   guaiFENesin  200 MG tablet Take 1 tablet (200 mg total) by mouth every 4 (four) hours as needed for cough or to loosen phlegm. 30 tablet 1   Iron , Ferrous Sulfate , 325 (65 Fe) MG TABS Take 325 mg by mouth daily. 90 tablet 0   losartan  (COZAAR ) 100 MG tablet Take 1 tablet (100 mg total) by mouth daily. 90 tablet 0   lovastatin  (MEVACOR ) 40 MG tablet  Take 1 tablet (40 mg total) by mouth at bedtime. 90 tablet 0   oxyCODONE -acetaminophen  (PERCOCET) 7.5-325 MG tablet Take 1 tablet by mouth 4 (four) times daily as needed. 120 tablet 0   polyethylene glycol (MIRALAX  / GLYCOLAX ) 17 g packet Take 17 g by mouth daily as needed. 90 each 0   potassium chloride  (KLOR-CON ) 10 MEQ tablet Take 1 tablet (10 mEq total) by mouth daily. 90 tablet 0   senna-docusate (SENOKOT-S) 8.6-50 MG tablet Take 1 tablet by mouth at bedtime.     sitaGLIPtin  (JANUVIA ) 25 MG tablet Take 1 tablet (25 mg total) by mouth daily. 90 tablet 0   Vitamin D , Ergocalciferol , (DRISDOL ) 1.25 MG (50000 UNIT) CAPS capsule Take  50,000 Units by mouth every Monday.     Vitamin D , Ergocalciferol , (DRISDOL ) 1.25 MG (50000 UNIT) CAPS capsule Take 1 capsule (50,000 Units total) by mouth every 7 (seven) days for 12 doses. 12 capsule 0   No current facility-administered medications on file prior to visit.    Allergies  Allergen Reactions   Penicillins Anaphylaxis    Did it involve swelling of the face/tongue/throat, SOB, or low BP? Yes Did it involve sudden or severe rash/hives, skin peeling, or any reaction on the inside of your mouth or nose? No Did you need to seek medical attention at a hospital or doctor's office? Yes When did it last happen?      Been a While  If all above answers are "NO", may proceed with cephalosporin use.    Aspirin Other (See Comments)    brusing _ patient requested to be listed    Social History   Socioeconomic History   Marital status: Widowed    Spouse name: Not on file   Number of children: Not on file   Years of education: Not on file   Highest education level: Not on file  Occupational History   Not on file  Tobacco Use   Smoking status: Never   Smokeless tobacco: Current    Types: Chew   Tobacco comments:    last used 03/17/2020 @ 2200  Vaping Use   Vaping status: Never Used  Substance and Sexual Activity   Alcohol use: Not Currently   Drug use: Never   Sexual activity: Not on file  Other Topics Concern   Not on file  Social History Narrative   Not on file   Social Drivers of Health   Financial Resource Strain: Low Risk  (03/27/2024)   Overall Financial Resource Strain (CARDIA)    Difficulty of Paying Living Expenses: Not hard at all  Food Insecurity: No Food Insecurity (03/27/2024)   Hunger Vital Sign    Worried About Running Out of Food in the Last Year: Never true    Ran Out of Food in the Last Year: Never true  Transportation Needs: No Transportation Needs (03/27/2024)   PRAPARE - Administrator, Civil Service (Medical): No    Lack of  Transportation (Non-Medical): No  Physical Activity: Inactive (03/27/2024)   Exercise Vital Sign    Days of Exercise per Week: 0 days    Minutes of Exercise per Session: 0 min  Stress: No Stress Concern Present (03/27/2024)   [employer] of Occupational Health - Occupational Stress Questionnaire    Feeling of Stress : Only a little  Social Connections: Moderately Isolated (03/27/2024)   Social Connection and Isolation Panel    Frequency of Communication with Friends and Family: More than three times a week  Frequency of Social Gatherings with Friends and Family: Twice a week    Attends Religious Services: More than 4 times per year    Active Member of [employer] or Organizations: No    Attends Banker Meetings: Never    Marital Status: Widowed  Intimate Partner Violence: Not At Risk (03/27/2024)   Humiliation, Afraid, Rape, and Kick questionnaire    Fear of Current or Ex-Partner: No    Emotionally Abused: No    Physically Abused: No    Sexually Abused: No    Family History  Problem Relation Age of Onset   Colon cancer Neg Hx    Colon polyps Neg Hx    Esophageal cancer Neg Hx    Rectal cancer Neg Hx    Stomach cancer Neg Hx     Past Surgical History:  Procedure Laterality Date   BACK SURGERY     lower back   IR LUMBAR DISC ASPIRATION W/IMG GUIDE  01/15/2022   LAMINECTOMY WITH POSTERIOR LATERAL ARTHRODESIS LEVEL 1 N/A 03/16/2022   Procedure: Lumbar One-Two Posterior Lateral Fusion with pedicle screws;  Surgeon: Louis Shove, MD;  Location: [facility] OR;  Service: Neurosurgery;  Laterality: N/A;   MULTIPLE TOOTH EXTRACTIONS      ROS: Review of Systems Negative except as stated above  PHYSICAL EXAM: BP (!) 146/83   Pulse 85   Temp 98.6 F (37 C) (Oral)   Resp 16   Ht 5' 2 (1.575 m)   Wt 201 lb 12.8 oz (91.5 kg)   SpO2 96%   BMI 36.91 kg/m   Physical Exam HENT:     Head: Normocephalic and atraumatic.     Right Ear: Tympanic membrane, ear canal and external  ear normal.     Left Ear: Tympanic membrane, ear canal and external ear normal.     Nose: Nose normal.     Mouth/Throat:     Mouth: Mucous membranes are moist.     Pharynx: Oropharynx is clear.   Eyes:     Extraocular Movements: Extraocular movements intact.     Conjunctiva/sclera: Conjunctivae normal.     Pupils: Pupils are equal, round, and reactive to light.    Cardiovascular:     Rate and Rhythm: Normal rate and regular rhythm.     Pulses: Normal pulses.     Heart sounds: Normal heart sounds.  Pulmonary:     Effort: Pulmonary effort is normal.     Breath sounds: Normal breath sounds.   Musculoskeletal:        General: Normal range of motion.     Cervical back: Normal range of motion and neck supple.   Neurological:     General: No focal deficit present.     Mental Status: She is alert and oriented to person, place, and time.   Psychiatric:        Mood and Affect: Mood normal.        Behavior: Behavior normal.     ASSESSMENT AND PLAN: 1. Sinusitis, unspecified chronicity, unspecified location (Primary) - Doxycycline  as prescribed. Counseled on medication adherence/adverse effects.  - Keep all scheduled appointments with ENT.  - Follow-up with primary provider as scheduled. - doxycycline  (VIBRA -TABS) 100 MG tablet; Take 1 tablet (100 mg total) by mouth 2 (two) times daily for 7 days.  Dispense: 14 tablet; Refill: 0  2. Primary hypertension - Blood pressure not at goal during today's visit. Patient asymptomatic without chest pressure, chest pain, palpitations, shortness of breath, worst headache of life,  and any additional red flag symptoms. - Continue present management. No refills needed as of present. - Counseled on blood pressure goal of less than 130/80, low-sodium, DASH diet, medication compliance, and 150 minutes of moderate intensity exercise per week as tolerated. Counseled on medication adherence and adverse effects. - Follow-up with primary provider in 2 to 4  weeks or sooner if needed.  3. Stress - Patient denies thoughts of self-harm, suicidal ideations, homicidal ideations. - Stress likely contributing to elevated blood pressure (see #2).  - Patient declined pharmacological therapy.  - Follow-up with primary provider as scheduled.  Patient was given the opportunity to ask questions.  Patient verbalized understanding of the plan and was able to repeat key elements of the plan. Patient was given clear instructions to go to Emergency Department or return to medical center if symptoms don't improve, worsen, or new problems develop.The patient verbalized understanding.    Requested Prescriptions   Signed Prescriptions Disp Refills   doxycycline  (VIBRA -TABS) 100 MG tablet 14 tablet 0    Sig: Take 1 tablet (100 mg total) by mouth 2 (two) times daily for 7 days.    Follow-up with primary provider as scheduled.  Hayley JINNY Drones, NP

## 2024-06-06 NOTE — Progress Notes (Signed)
 One month follow, can not get an appointment for ENT until August so she needs something for her sinus.    Can not get a OBGYN appointment until the end of July, medication refill having trouble with them patient wants a 3 month supply on all her medication.

## 2024-06-12 ENCOUNTER — Other Ambulatory Visit: Payer: Self-pay | Admitting: Family

## 2024-06-12 ENCOUNTER — Telehealth: Payer: Self-pay | Admitting: *Deleted

## 2024-06-12 DIAGNOSIS — M792 Neuralgia and neuritis, unspecified: Secondary | ICD-10-CM

## 2024-06-12 MED ORDER — GABAPENTIN 300 MG PO CAPS: 300.0000 mg | ORAL_CAPSULE | Freq: Four times a day (QID) | ORAL | 2 refills | Status: DC

## 2024-06-12 NOTE — Telephone Encounter (Signed)
 Complete

## 2024-06-12 NOTE — Telephone Encounter (Unsigned)
 Copied from CRM [id number]. Topic: Clinical - Medication Refill >> Jun 12, 2024 10:59 AM Ivette P wrote: Medication: Vitamin D , Ergocalciferol , (DRISDOL ) 1.25 MG (50000 UNIT) CAPS capsule  Has the patient contacted their pharmacy? Yes (Agent: If no, request that the patient contact the pharmacy for the refill. If patient does not wish to contact the pharmacy document the reason why and proceed with request.) (Agent: If yes, when and what did the pharmacy advise?)  This is the patient's preferred pharmacy:  [facility] [address], [place] - [address] AT [address] [place] [address] Phone: [phone] Fax: [phone]  Is this the correct pharmacy for this prescription? Yes If no, delete pharmacy and type the correct one.   Has the prescription been filled recently? No, 02/04/2020  Is the patient out of the medication? Yes, 2 pills left  Has the patient been seen for an appointment in the last year OR does the patient have an upcoming appointment? Yes  Can we respond through MyChart? Yes  Agent: Please be advised that Rx refills may take up to 3 business days. We ask that you follow-up with your pharmacy.

## 2024-06-12 NOTE — Telephone Encounter (Signed)
 Pt called in because she was prescribed gabapentin  (NEURONTIN ) 300 MG capsule  and the medication is not strong enough and not doing anything. Pt would like to know if can be stronger or if something else can be called in.

## 2024-06-14 ENCOUNTER — Encounter: Attending: Registered Nurse | Admitting: Registered Nurse

## 2024-06-14 VITALS — BP 110/68 | HR 77 | Ht 62.0 in | Wt 203.0 lb

## 2024-06-14 DIAGNOSIS — Z5181 Encounter for therapeutic drug level monitoring: Secondary | ICD-10-CM | POA: Insufficient documentation

## 2024-06-14 DIAGNOSIS — M1712 Unilateral primary osteoarthritis, left knee: Secondary | ICD-10-CM | POA: Diagnosis not present

## 2024-06-14 DIAGNOSIS — Z79891 Long term (current) use of opiate analgesic: Secondary | ICD-10-CM | POA: Insufficient documentation

## 2024-06-14 DIAGNOSIS — M1711 Unilateral primary osteoarthritis, right knee: Secondary | ICD-10-CM | POA: Insufficient documentation

## 2024-06-14 DIAGNOSIS — G894 Chronic pain syndrome: Secondary | ICD-10-CM | POA: Insufficient documentation

## 2024-06-14 DIAGNOSIS — M5416 Radiculopathy, lumbar region: Secondary | ICD-10-CM | POA: Insufficient documentation

## 2024-06-14 MED ORDER — OXYCODONE-ACETAMINOPHEN 7.5-325 MG PO TABS: 1.0000 | ORAL_TABLET | Freq: Four times a day (QID) | ORAL | 0 refills | Status: DC | PRN

## 2024-06-14 NOTE — Telephone Encounter (Signed)
 Requested medication (s) are due for refill today: yes  Requested medication (s) are on the active medication list: yes  Last refill:  03/04/20  Future visit scheduled: yes  Notes to clinic:  Unable to refill per protocol, last refill by another provider.      Requested Prescriptions  Pending Prescriptions Disp Refills   Vitamin D , Ergocalciferol , (DRISDOL ) 1.25 MG (50000 UNIT) CAPS capsule 5 capsule     Sig: Take 1 capsule (50,000 Units total) by mouth every Monday.     Endocrinology:  Vitamins - Vitamin D  Supplementation 2 Failed - 06/14/2024  8:57 AM      Failed - Manual Review: Route requests for 50,000 IU strength to the provider      Passed - Ca in normal range and within 360 days    Calcium   Date Value Ref Range Status  03/27/2024 8.9 8.7 - 10.3 mg/dL Final         Passed - Vitamin D  in normal range and within 360 days    Vit D, 25-Hydroxy  Date Value Ref Range Status  03/27/2024 60.6 30.0 - 100.0 ng/mL Final    Comment:    Vitamin D  deficiency has been defined by the Institute of Medicine and an Endocrine Society practice guideline as a level of serum 25-OH vitamin D  less than 20 ng/mL (1,2). The Endocrine Society went on to further define vitamin D  insufficiency as a level between 21 and 29 ng/mL (2). 1. IOM (Institute of Medicine). 2010. Dietary reference    intakes for calcium  and D. [place]: The    [employer]. 2. Holick MF, Binkley [place], Bischoff-Ferrari HA, et al.    Evaluation, treatment, and prevention of vitamin D     deficiency: an Endocrine Society clinical practice    guideline. JCEM. 2011 Jul; 96(7):1911-30.          Passed - Valid encounter within last 12 months    Recent Outpatient Visits           1 week ago Sinusitis, unspecified chronicity, unspecified location   [facility] [place] [facility], [place], [place]   1 month ago Primary hypertension   [place] [facility], [place], NP    2 months ago Annual physical exam   [place] [facility], Amy J, NP   3 months ago Sinusitis, unspecified chronicity, unspecified location   [facility], Amy J, NP   6 months ago Primary hypertension    [facility] at [facility], Greig PARAS, NP

## 2024-06-14 NOTE — Progress Notes (Signed)
 Subjective:    Patient ID: Hayley Padilla, female    DOB: [birth year], 73 y.o.   MRN: [id number]  HPI: Hayley Padilla is a 73 y.o. female who returns for follow up appointment for chronic pain and medication refill. She states her pain is located in her lower back radiating into her buttocks. She rates her pain 10. Her current exercise regime is walking and performing stretching exercises.10  Mr. Strollo  Morphine  equivalent is 45.00 MME.    Oral Swab was Performed on 06/14/2024, it was consistent.    Pain Inventory Average Pain 8 Pain Right Now 10 My pain is aching  In the last 24 hours, has pain interfered with the following? General activity 4 Relation with others 0 Enjoyment of life 0 What TIME of day is your pain at its worst? daytime Sleep (in general) Good  Pain is worse with: walking, standing, and some activites Pain improves with: medication Relief from Meds: 5  Family History  Problem Relation Age of Onset   Colon cancer Neg Hx    Colon polyps Neg Hx    Esophageal cancer Neg Hx    Rectal cancer Neg Hx    Stomach cancer Neg Hx    Social History   Socioeconomic History   Marital status: Widowed    Spouse name: Not on file   Number of children: Not on file   Years of education: Not on file   Highest education level: Not on file  Occupational History   Not on file  Tobacco Use   Smoking status: Never   Smokeless tobacco: Current    Types: Chew   Tobacco comments:    last used 03/17/2020 @ 2200  Vaping Use   Vaping status: Never Used  Substance and Sexual Activity   Alcohol use: Not Currently   Drug use: Never   Sexual activity: Not on file  Other Topics Concern   Not on file  Social History Narrative   Not on file   Social Drivers of Health   Financial Resource Strain: Low Risk  (03/27/2024)   Overall Financial Resource Strain (CARDIA)    Difficulty of Paying Living Expenses: Not hard at all  Food Insecurity: No Food Insecurity (03/27/2024)   Hunger  Vital Sign    Worried About Running Out of Food in the Last Year: Never true    Ran Out of Food in the Last Year: Never true  Transportation Needs: No Transportation Needs (03/27/2024)   PRAPARE - Administrator, Civil Service (Medical): No    Lack of Transportation (Non-Medical): No  Physical Activity: Inactive (03/27/2024)   Exercise Vital Sign    Days of Exercise per Week: 0 days    Minutes of Exercise per Session: 0 min  Stress: No Stress Concern Present (03/27/2024)   Hayley Padilla of Occupational Health - Occupational Stress Questionnaire    Feeling of Stress : Only a little  Social Connections: Moderately Isolated (03/27/2024)   Social Connection and Isolation Panel    Frequency of Communication with Friends and Family: More than three times a week    Frequency of Social Gatherings with Friends and Family: Twice a week    Attends Religious Services: More than 4 times per year    Active Member of [employer] or Organizations: No    Attends Banker Meetings: Never    Marital Status: Widowed   Past Surgical History:  Procedure Laterality Date   BACK SURGERY  lower back   IR LUMBAR DISC ASPIRATION W/IMG GUIDE  01/15/2022   LAMINECTOMY WITH POSTERIOR LATERAL ARTHRODESIS LEVEL 1 N/A 03/16/2022   Procedure: Lumbar One-Two Posterior Lateral Fusion with pedicle screws;  Surgeon: Louis Shove, MD;  Location[facility];  Service: Neurosurgery;  Laterality: N/A;   MULTIPLE TOOTH EXTRACTIONS     Past Surgical History:  Procedure Laterality Date   BACK SURGERY     lower back   IR LUMBAR DISC ASPIRATION W/IMG GUIDE  01/15/2022   LAMINECTOMY WITH POSTERIOR LATERAL ARTHRODESIS LEVEL 1 N/A 03/16/2022   Procedure: Lumbar One-Two Posterior Lateral Fusion with pedicle screws;  Surgeon: Louis Shove, MD;  Location: [facility] OR;  Service: Neurosurgery;  Laterality: N/A;   MULTIPLE TOOTH EXTRACTIONS     Past Medical History:  Diagnosis Date   Allergy    Arthritis    Diabetes mellitus  without complication (HCC)    Hyperlipidemia    Hypertension    PONV (postoperative nausea and vomiting)    BP 110/68   Pulse 77   Ht 5' 2 (1.575 m)   Wt 203 lb (92.1 kg)   SpO2 97%   BMI 37.13 kg/m   Opioid Risk Score:   Fall Risk Score:  `1  Depression screen PHQ 2/9     06/06/2024    3:14 PM 03/27/2024    1:24 PM 02/17/2024    2:41 PM 01/17/2024    1:13 PM 12/23/2023   10:03 AM 10/20/2023    1:07 PM 10/18/2023    2:58 PM  Depression screen PHQ 2/9  Decreased Interest 0 0 0 0 0 0 0  Down, Depressed, Hopeless 2 0 0 0 0 0   PHQ - 2 Score 2 0 0 0 0 0 0  Altered sleeping 0        Tired, decreased energy 0        Change in appetite 1        Feeling bad or failure about yourself  0        Trouble concentrating 0        Moving slowly or fidgety/restless 1        Suicidal thoughts 0        PHQ-9 Score 4        Difficult doing work/chores Somewhat difficult            Review of Systems  Musculoskeletal:  Positive for back pain.       Buttocks pain Back of right knee pain Right leg pain  All other systems reviewed and are negative.      Objective:   Physical Exam Vitals and nursing note reviewed.  Constitutional:      Appearance: Normal appearance.  Cardiovascular:     Rate and Rhythm: Normal rate and regular rhythm.     Pulses: Normal pulses.     Heart sounds: Normal heart sounds.  Pulmonary:     Effort: Pulmonary effort is normal.     Breath sounds: Normal breath sounds.  Musculoskeletal:     Right lower leg: Edema present.     Left lower leg: Edema present.     Comments: Normal Muscle Bulk and Muscle Testing Reveals:  Upper Extremities: Full ROM and Muscle Strength 5/5  Lumbar Paraspinal Tenderness: L-4-L-5 Lower Extremities: Full ROM and Muscle Strength 5/5 Arises from Table slowly using walker for support Antalgic  Gait     Skin:    General: Skin is warm and dry.  Neurological:  Mental Status: She is alert and oriented to person, place, and time.   Psychiatric:        Mood and Affect: Mood normal.        Behavior: Behavior normal.          Assessment & Plan:  Lumbar Post Laminectomy Syndrome: Hayley Padilla underwent on 03/17/2023 : Dr Louis : Lumbar One-Two Posterior Lateral Fusion with pedicle screws  Continue HEP as Tolerated. Continue to Monitor. 06/14/2024 2. Lumbar Radiculitis: Continue Gabapentin . Continue to monitor. 06/14/2024 3. Chronic Bilateral Hip Pain: No complaints today. Continue HEP as Tolerated. Continue to Monitor. 06/14/2024 4. Chronic Pain of Bilateral Knees: R>LContinue HEP as Tolerated. Continue to Monitor. 06/14/2024 5. Chronic Pain Syndrome: Refilled: Oxycodone  7.5/325 mg one tablet 4 times a day as needed for pain #120.  We will continue the opioid monitoring program, this consists of regular clinic visits, examinations, urine drug screen, pill counts as well as use of [place]  Controlled Substance Reporting system. A 12 month History has been reviewed on the [place]  Controlled Substance Reporting System on 06/14/2024. 6, Left Shoulder pain: No complaints today. Continue to monitor.  06/14/2024 7. Bilateral Ankle Pain: Continue HEP as Tolerated. Continue to Monitor. 06/14/2024 F/U in 1 month

## 2024-06-15 ENCOUNTER — Other Ambulatory Visit: Payer: Self-pay | Admitting: Family

## 2024-06-15 DIAGNOSIS — E1165 Type 2 diabetes mellitus with hyperglycemia: Secondary | ICD-10-CM

## 2024-06-18 LAB — DRUG TOX MONITOR 1 W/CONF, ORAL FLD
Amphetamines: NEGATIVE ng/mL (ref <10)
Barbiturates: NEGATIVE ng/mL (ref <10)
Benzodiazepines: NEGATIVE ng/mL (ref <0.50)
Buprenorphine: NEGATIVE ng/mL (ref <0.10)
Cocaine: NEGATIVE ng/mL (ref <5.0)
Codeine: NEGATIVE ng/mL (ref <2.5)
Cotinine: 183 ng/mL — ABNORMAL HIGH (ref <5.0)
Dihydrocodeine: NEGATIVE ng/mL (ref <2.5)
Fentanyl: NEGATIVE ng/mL (ref <0.10)
Heroin Metabolite: NEGATIVE ng/mL (ref <1.0)
Hydrocodone: NEGATIVE ng/mL (ref <2.5)
Hydromorphone: NEGATIVE ng/mL (ref <2.5)
MARIJUANA: NEGATIVE ng/mL (ref <2.5)
MDMA: NEGATIVE ng/mL (ref <10)
Meprobamate: NEGATIVE ng/mL (ref <2.5)
Methadone: NEGATIVE ng/mL (ref <5.0)
Morphine: NEGATIVE ng/mL (ref <2.5)
Nicotine Metabolite: POSITIVE ng/mL — AB (ref <5.0)
Norhydrocodone: NEGATIVE ng/mL (ref <2.5)
Noroxycodone: 11.2 ng/mL — ABNORMAL HIGH (ref <2.5)
Opiates: POSITIVE ng/mL — AB (ref <2.5)
Oxycodone: 227.2 ng/mL — ABNORMAL HIGH (ref <2.5)
Oxymorphone: NEGATIVE ng/mL (ref <2.5)
Phencyclidine: NEGATIVE ng/mL (ref <10)
Tapentadol: NEGATIVE ng/mL (ref <5.0)
Tramadol: NEGATIVE ng/mL (ref <5.0)
Zolpidem: NEGATIVE ng/mL (ref <5.0)

## 2024-06-18 LAB — DRUG TOX ALC METAB W/CON, ORAL FLD: Alcohol Metabolite: NEGATIVE ng/mL (ref <25)

## 2024-06-19 NOTE — Telephone Encounter (Signed)
 Complete

## 2024-06-19 NOTE — Telephone Encounter (Signed)
 Copied from CRM [id number]. Topic: Clinical - Medication Question >> Jun 19, 2024 10:48 AM Powell HERO wrote: Reason for CRM: [facility] calling to see if this patient is still taking metformin , [phone] ex [phone]. Secure VM is available. Please call to advise.

## 2024-06-21 ENCOUNTER — Institutional Professional Consult (permissible substitution) (INDEPENDENT_AMBULATORY_CARE_PROVIDER_SITE_OTHER): Admitting: Physician Assistant

## 2024-06-24 ENCOUNTER — Encounter: Payer: Self-pay | Admitting: Registered Nurse

## 2024-07-07 ENCOUNTER — Ambulatory Visit: Payer: Self-pay | Admitting: Family

## 2024-07-07 ENCOUNTER — Ambulatory Visit (INDEPENDENT_AMBULATORY_CARE_PROVIDER_SITE_OTHER): Admitting: Family

## 2024-07-07 ENCOUNTER — Encounter: Payer: Self-pay | Admitting: Family

## 2024-07-07 VITALS — BP 127/74 | HR 69 | Temp 98.1°F | Resp 16 | Ht 62.0 in | Wt 204.4 lb

## 2024-07-07 DIAGNOSIS — E785 Hyperlipidemia, unspecified: Secondary | ICD-10-CM

## 2024-07-07 DIAGNOSIS — I10 Essential (primary) hypertension: Secondary | ICD-10-CM | POA: Diagnosis not present

## 2024-07-07 DIAGNOSIS — E119 Type 2 diabetes mellitus without complications: Secondary | ICD-10-CM

## 2024-07-07 DIAGNOSIS — E876 Hypokalemia: Secondary | ICD-10-CM | POA: Diagnosis not present

## 2024-07-07 DIAGNOSIS — G8929 Other chronic pain: Secondary | ICD-10-CM

## 2024-07-07 DIAGNOSIS — E559 Vitamin D deficiency, unspecified: Secondary | ICD-10-CM | POA: Diagnosis not present

## 2024-07-07 DIAGNOSIS — E1165 Type 2 diabetes mellitus with hyperglycemia: Secondary | ICD-10-CM | POA: Diagnosis not present

## 2024-07-07 DIAGNOSIS — Z7984 Long term (current) use of oral hypoglycemic drugs: Secondary | ICD-10-CM | POA: Diagnosis not present

## 2024-07-07 DIAGNOSIS — M25569 Pain in unspecified knee: Secondary | ICD-10-CM | POA: Diagnosis not present

## 2024-07-07 DIAGNOSIS — M792 Neuralgia and neuritis, unspecified: Secondary | ICD-10-CM

## 2024-07-07 LAB — POCT GLYCOSYLATED HEMOGLOBIN (HGB A1C): Hemoglobin A1C: 6.4 % — AB (ref 4.0–5.6)

## 2024-07-07 MED ORDER — CLONIDINE HCL 0.1 MG PO TABS: 0.1000 mg | ORAL_TABLET | Freq: Two times a day (BID) | ORAL | 0 refills | Status: DC

## 2024-07-07 MED ORDER — GABAPENTIN 300 MG PO CAPS: 300.0000 mg | ORAL_CAPSULE | Freq: Four times a day (QID) | ORAL | 2 refills | Status: DC

## 2024-07-07 MED ORDER — AMLODIPINE BESYLATE 10 MG PO TABS: 10.0000 mg | ORAL_TABLET | Freq: Every day | ORAL | 0 refills | Status: DC

## 2024-07-07 MED ORDER — LOVASTATIN 40 MG PO TABS: 40.0000 mg | ORAL_TABLET | Freq: Every day | ORAL | 0 refills | Status: DC

## 2024-07-07 MED ORDER — LOSARTAN POTASSIUM 100 MG PO TABS: 100.0000 mg | ORAL_TABLET | Freq: Every day | ORAL | 0 refills | Status: DC

## 2024-07-07 MED ORDER — POTASSIUM CHLORIDE ER 10 MEQ PO TBCR: 10.0000 meq | EXTENDED_RELEASE_TABLET | Freq: Every day | ORAL | 0 refills | Status: DC

## 2024-07-07 MED ORDER — FREESTYLE LIBRE 2 PLUS SENSOR MISC: 1.0000 | Freq: Three times a day (TID) | 2 refills | Status: AC

## 2024-07-07 MED ORDER — VITAMIN D (ERGOCALCIFEROL) 1.25 MG (50000 UNIT) PO CAPS: 50000.0000 [IU] | ORAL_CAPSULE | ORAL | 0 refills | Status: DC

## 2024-07-07 MED ORDER — DICLOFENAC SODIUM 75 MG PO TBEC: 75.0000 mg | DELAYED_RELEASE_TABLET | Freq: Two times a day (BID) | ORAL | 0 refills | Status: DC

## 2024-07-07 MED ORDER — FREESTYLE LIBRE 2 READER DEVI: 1.0000 | Freq: Three times a day (TID) | 2 refills | Status: AC

## 2024-07-07 MED ORDER — SITAGLIPTIN PHOSPHATE 25 MG PO TABS: 25.0000 mg | ORAL_TABLET | Freq: Every day | ORAL | 0 refills | Status: AC

## 2024-07-07 NOTE — Progress Notes (Signed)
 Medication refill, patient gets hot at night

## 2024-07-07 NOTE — Progress Notes (Signed)
 Patient ID: Hayley Padilla, female    DOB: Dec 09, 1951  MRN: [id number]  CC: Chronic Conditions Follow-Up  Subjective: Hayley Padilla is a 73 y.o. female who presents for chronic conditions follow-up.  Her concerns today include:  - Doing well on Amlodipine , Losartan , and Clonidine , no issues/concerns. She does not complain of red flag symptoms such as but not limited to chest pain, shortness of breath, worst headache of life, nausea/vomiting.  - Doing well on Januvia , no issues/concerns. Denies red flag symptoms associated with diabetes.  - Due for diabetic eye exam.  - Due for diabetic foot exam.  - Doing well on Lovastatin , no issues/concerns.  - Doing well on Potassium-Chloride, no issues/concerns.  - Doing well on Vitamin D , no issues/concerns.  - Doing well on Diclofenac , no issues/concerns.  - Doing well on Gabapentin , no issues/concerns. - States face becomes hot during nighttime. Denies red flag symptoms. States she plans to discuss at upcoming appointments with Gynecology and sinus specialist. She declines referral to specialist.  Patient Active Problem List   Diagnosis Date Noted   Diabetes mellitus, type 2 (HCC) 06/24/2023   Vertebral osteomyelitis (HCC) 03/12/2022   Hypokalemia 03/12/2022   Edema of right lower extremity 01/15/2022   Essential hypertension 01/14/2022   Chronic pain 01/14/2022   HLD (hyperlipidemia) 01/14/2022   Discitis 01/14/2022   Obesity (BMI 30-39.9) 01/13/2022   S/P lumbar spinal fusion 01/13/2022   Discitis of lumbar region 01/13/2022   Kyphosis 01/13/2022   Degenerative spondylolisthesis 06/12/2019     Current Outpatient Medications on File Prior to Visit  Medication Sig Dispense Refill   acetaminophen  (TYLENOL ) 650 MG CR tablet Take 650 mg by mouth every 8 (eight) hours as needed for pain.     benzonatate  (TESSALON ) 100 MG capsule Take 1 capsule (100 mg total) by mouth 3 (three) times daily as needed for cough. 30 capsule 0   Continuous  Glucose Receiver (DEXCOM G6 RECEIVER) DEVI 1 each by Other route 4 (four) times daily -  before meals and at bedtime. 1 each 2   Continuous Glucose Sensor (DEXCOM G6 SENSOR) MISC 1 each by Other route 4 (four) times daily -  before meals and at bedtime. 3 each 2   Continuous Glucose Transmitter (DEXCOM G6 TRANSMITTER) MISC 1 each by Other route 4 (four) times daily -  before meals and at bedtime. 1 each 2   furosemide  (LASIX ) 40 MG tablet Take 40 mg by mouth daily.     guaiFENesin  200 MG tablet Take 1 tablet (200 mg total) by mouth every 4 (four) hours as needed for cough or to loosen phlegm. 30 tablet 1   Iron , Ferrous Sulfate , 325 (65 Fe) MG TABS Take 325 mg by mouth daily. 90 tablet 0   oxyCODONE -acetaminophen  (PERCOCET) 7.5-325 MG tablet Take 1 tablet by mouth 4 (four) times daily as needed. 120 tablet 0   polyethylene glycol (MIRALAX  / GLYCOLAX ) 17 g packet Take 17 g by mouth daily as needed. 90 each 0   senna-docusate (SENOKOT-S) 8.6-50 MG tablet Take 1 tablet by mouth at bedtime.     Vitamin D , Ergocalciferol , (DRISDOL ) 1.25 MG (50000 UNIT) CAPS capsule Take 1 capsule (50,000 Units total) by mouth every 7 (seven) days for 12 doses. 12 capsule 0   buprenorphine  (BUTRANS ) 10 MCG/HR PTWK  (Patient not taking: Reported on 07/07/2024)     No current facility-administered medications on file prior to visit.    Allergies  Allergen Reactions   Penicillins Anaphylaxis  Did it involve swelling of the face/tongue/throat, SOB, or low BP? Yes Did it involve sudden or severe rash/hives, skin peeling, or any reaction on the inside of your mouth or nose? No Did you need to seek medical attention at a hospital or doctor's office? Yes When did it last happen?      Been a While  If all above answers are "NO", may proceed with cephalosporin use.    Aspirin Other (See Comments)    brusing _ patient requested to be listed    Social History   Socioeconomic History   Marital status: Widowed     Spouse name: Not on file   Number of children: Not on file   Years of education: Not on file   Highest education level: Not on file  Occupational History   Not on file  Tobacco Use   Smoking status: Never   Smokeless tobacco: Current    Types: Chew   Tobacco comments:    last used 03/17/2020 @ 2200  Vaping Use   Vaping status: Never Used  Substance and Sexual Activity   Alcohol use: Not Currently   Drug use: Never   Sexual activity: Not on file  Other Topics Concern   Not on file  Social History Narrative   Not on file   Social Drivers of Health   Financial Resource Strain: Low Risk  (03/27/2024)   Overall Financial Resource Strain (CARDIA)    Difficulty of Paying Living Expenses: Not hard at all  Food Insecurity: No Food Insecurity (03/27/2024)   Hunger Vital Sign    Worried About Running Out of Food in the Last Year: Never true    Ran Out of Food in the Last Year: Never true  Transportation Needs: No Transportation Needs (03/27/2024)   PRAPARE - Administrator, Civil Service (Medical): No    Lack of Transportation (Non-Medical): No  Physical Activity: Inactive (03/27/2024)   Exercise Vital Sign    Days of Exercise per Week: 0 days    Minutes of Exercise per Session: 0 min  Stress: No Stress Concern Present (03/27/2024)   [employer] of Occupational Health - Occupational Stress Questionnaire    Feeling of Stress : Only a little  Social Connections: Moderately Isolated (03/27/2024)   Social Connection and Isolation Panel    Frequency of Communication with Friends and Family: More than three times a week    Frequency of Social Gatherings with Friends and Family: Twice a week    Attends Religious Services: More than 4 times per year    Active Member of [employer] or Organizations: No    Attends Banker Meetings: Never    Marital Status: Widowed  Intimate Partner Violence: Not At Risk (03/27/2024)   Humiliation, Afraid, Rape, and Kick questionnaire     Fear of Current or Ex-Partner: No    Emotionally Abused: No    Physically Abused: No    Sexually Abused: No    Family History  Problem Relation Age of Onset   Colon cancer Neg Hx    Colon polyps Neg Hx    Esophageal cancer Neg Hx    Rectal cancer Neg Hx    Stomach cancer Neg Hx     Past Surgical History:  Procedure Laterality Date   BACK SURGERY     lower back   IR LUMBAR DISC ASPIRATION W/IMG GUIDE  01/15/2022   LAMINECTOMY WITH POSTERIOR LATERAL ARTHRODESIS LEVEL 1 N/A 03/16/2022   Procedure: Lumbar One-Two  Posterior Lateral Fusion with pedicle screws;  Surgeon: Louis Shove, MD;  Location: [facility];  Service: Neurosurgery;  Laterality: N/A;   MULTIPLE TOOTH EXTRACTIONS      ROS: Review of Systems Negative except as stated above  PHYSICAL EXAM: BP 127/74   Pulse 69   Temp 98.1 F (36.7 C) (Oral)   Resp 16   Ht 5' 2 (1.575 m)   Wt 204 lb 6.4 oz (92.7 kg)   SpO2 95%   BMI 37.39 kg/m   Physical Exam HENT:     Head: Normocephalic and atraumatic.     Nose: Nose normal.     Mouth/Throat:     Mouth: Mucous membranes are moist.     Pharynx: Oropharynx is clear.  Eyes:     Extraocular Movements: Extraocular movements intact.     Conjunctiva/sclera: Conjunctivae normal.     Pupils: Pupils are equal, round, and reactive to light.  Cardiovascular:     Rate and Rhythm: Normal rate and regular rhythm.     Pulses: Normal pulses.     Heart sounds: Normal heart sounds.  Pulmonary:     Effort: Pulmonary effort is normal.     Breath sounds: Normal breath sounds.  Musculoskeletal:        General: Normal range of motion.     Cervical back: Normal range of motion and neck supple.  Neurological:     General: No focal deficit present.     Mental Status: She is alert and oriented to person, place, and time.  Psychiatric:        Mood and Affect: Mood normal.        Behavior: Behavior normal.     ASSESSMENT AND PLAN: 1. Primary hypertension (Primary) - Continue Amlodipine ,  Losartan , and Clonidine  as prescribed.  - Counseled on blood pressure goal of less than 130/80, low-sodium, DASH diet, medication compliance, and 150 minutes of moderate intensity exercise per week as tolerated. Counseled on medication adherence and adverse effects. - Follow-up with primary provider in 3 months or sooner if needed.  - amLODipine  (NORVASC ) 10 MG tablet; Take 1 tablet (10 mg total) by mouth daily.  Dispense: 90 tablet; Refill: 0 - cloNIDine  (CATAPRES ) 0.1 MG tablet; Take 1 tablet (0.1 mg total) by mouth 2 (two) times daily.  Dispense: 180 tablet; Refill: 0 - losartan  (COZAAR ) 100 MG tablet; Take 1 tablet (100 mg total) by mouth daily.  Dispense: 90 tablet; Refill: 0  2. Type 2 diabetes mellitus with hyperglycemia, without long-term current use of insulin (HCC) - Continue Sitagliptin  as prescribed. - Diabetic testing supplies prescribed. - Hemoglobin A1c result pending.  - Discussed the importance of healthy eating habits, low-carbohydrate diet, low-sugar diet, regular aerobic exercise (at least 150 minutes a week as tolerated) and medication compliance to achieve or maintain control of diabetes. Counseled on medication adherence/adverse effects.  - Follow-up with primary provider as scheduled. - POCT glycosylated hemoglobin (Hb A1C) - Continuous Glucose Receiver (FREESTYLE LIBRE 2 READER) DEVI; 1 each by Other route 4 (four) times daily -  before meals and at bedtime.  Dispense: 1 each; Refill: 2 - Continuous Glucose Sensor (FREESTYLE LIBRE 2 PLUS SENSOR) MISC; 1 each by Other route 4 (four) times daily -  before meals and at bedtime.  Dispense: 1 each; Refill: 2 - sitaGLIPtin  (JANUVIA ) 25 MG tablet; Take 1 tablet (25 mg total) by mouth daily.  Dispense: 90 tablet; Refill: 0  3. Diabetic eye exam [facility]) - Referral to Ophthalmology for evaluation/management. -  Ambulatory referral to Ophthalmology  4. Encounter for diabetic foot exam [facility]) - Referral to Podiatry for  evaluation/management.  - Ambulatory referral to Podiatry  5. Hyperlipidemia, unspecified hyperlipidemia type - Continue Lovastatin  as prescribed. Counseled on medication adherence/adverse effects.  - Follow-up with primary provider in 3 months or sooner if needed.  - lovastatin  (MEVACOR ) 40 MG tablet; Take 1 tablet (40 mg total) by mouth at bedtime.  Dispense: 90 tablet; Refill: 0  6. Hypokalemia - Continue Potassium Chloride  as prescribed. Counseled on medication adherence/adverse effects.  - Follow-up with primary provider in 3 months or sooner if needed.  - potassium chloride  (KLOR-CON ) 10 MEQ tablet; Take 1 tablet (10 mEq total) by mouth daily.  Dispense: 90 tablet; Refill: 0  7. Vitamin D  deficiency - Continue Vitamin D  Ergocalciferol  as prescribed. Counseled on medication adherence/adverse effects.  - Follow-up with primary provider in 3 months or sooner if needed.  - Vitamin D , Ergocalciferol , (DRISDOL ) 1.25 MG (50000 UNIT) CAPS capsule; Take 1 capsule (50,000 Units total) by mouth every Monday for 12 doses.  Dispense: 12 capsule; Refill: 0  8. Chronic knee pain, unspecified laterality - Continue Diclofenac  as prescribed. Counseled on medication adherence/adverse effects.  - Follow-up with primary provider in 3 months or sooner if needed.  - diclofenac  (VOLTAREN ) 75 MG EC tablet; Take 1 tablet (75 mg total) by mouth 2 (two) times daily.  Dispense: 180 tablet; Refill: 0  9. Neuropathic pain - Continue Gabapentin  as prescribed.  Counseled on medication adherence/adverse effects.  - Follow-up with primary provider in 3 months or sooner if needed.  - gabapentin  (NEURONTIN ) 300 MG capsule; Take 1 capsule (300 mg total) by mouth 4 (four) times daily.  Dispense: 120 capsule; Refill: 2   Patient was given the opportunity to ask questions.  Patient verbalized understanding of the plan and was able to repeat key elements of the plan. Patient was given clear instructions to go to Emergency  Department or return to medical center if symptoms don't improve, worsen, or new problems develop.The patient verbalized understanding.   Orders Placed This Encounter  Procedures   Ambulatory referral to Podiatry   Ambulatory referral to Ophthalmology   POCT glycosylated hemoglobin (Hb A1C)     Requested Prescriptions   Signed Prescriptions Disp Refills   amLODipine  (NORVASC ) 10 MG tablet 90 tablet 0    Sig: Take 1 tablet (10 mg total) by mouth daily.   cloNIDine  (CATAPRES ) 0.1 MG tablet 180 tablet 0    Sig: Take 1 tablet (0.1 mg total) by mouth 2 (two) times daily.   Continuous Glucose Receiver (FREESTYLE LIBRE 2 READER) DEVI 1 each 2    Sig: 1 each by Other route 4 (four) times daily -  before meals and at bedtime.   Continuous Glucose Sensor (FREESTYLE LIBRE 2 PLUS SENSOR) MISC 1 each 2    Sig: 1 each by Other route 4 (four) times daily -  before meals and at bedtime.   diclofenac  (VOLTAREN ) 75 MG EC tablet 180 tablet 0    Sig: Take 1 tablet (75 mg total) by mouth 2 (two) times daily.   gabapentin  (NEURONTIN ) 300 MG capsule 120 capsule 2    Sig: Take 1 capsule (300 mg total) by mouth 4 (four) times daily.   sitaGLIPtin  (JANUVIA ) 25 MG tablet 90 tablet 0    Sig: Take 1 tablet (25 mg total) by mouth daily.   losartan  (COZAAR ) 100 MG tablet 90 tablet 0    Sig: Take 1 tablet (  100 mg total) by mouth daily.   lovastatin  (MEVACOR ) 40 MG tablet 90 tablet 0    Sig: Take 1 tablet (40 mg total) by mouth at bedtime.   potassium chloride  (KLOR-CON ) 10 MEQ tablet 90 tablet 0    Sig: Take 1 tablet (10 mEq total) by mouth daily.   Vitamin D , Ergocalciferol , (DRISDOL ) 1.25 MG (50000 UNIT) CAPS capsule 12 capsule 0    Sig: Take 1 capsule (50,000 Units total) by mouth every Monday for 12 doses.    Return in about 3 months (around 10/07/2024) for Follow-Up or next available chronic conditions.  Greig JINNY Drones, NP

## 2024-07-11 ENCOUNTER — Ambulatory Visit (INDEPENDENT_AMBULATORY_CARE_PROVIDER_SITE_OTHER): Admitting: Otolaryngology

## 2024-07-11 ENCOUNTER — Encounter (INDEPENDENT_AMBULATORY_CARE_PROVIDER_SITE_OTHER): Payer: Self-pay | Admitting: Otolaryngology

## 2024-07-11 VITALS — BP 145/75 | HR 80 | Ht 60.0 in | Wt 204.0 lb

## 2024-07-11 DIAGNOSIS — J342 Deviated nasal septum: Secondary | ICD-10-CM

## 2024-07-11 DIAGNOSIS — J343 Hypertrophy of nasal turbinates: Secondary | ICD-10-CM | POA: Diagnosis not present

## 2024-07-11 DIAGNOSIS — J310 Chronic rhinitis: Secondary | ICD-10-CM

## 2024-07-11 DIAGNOSIS — R0981 Nasal congestion: Secondary | ICD-10-CM

## 2024-07-11 MED ORDER — FLUTICASONE PROPIONATE 50 MCG/ACT NA SUSP: 2.0000 | Freq: Every day | NASAL | 10 refills | Status: AC

## 2024-07-12 DIAGNOSIS — J343 Hypertrophy of nasal turbinates: Secondary | ICD-10-CM | POA: Insufficient documentation

## 2024-07-12 DIAGNOSIS — J342 Deviated nasal septum: Secondary | ICD-10-CM | POA: Insufficient documentation

## 2024-07-12 DIAGNOSIS — J310 Chronic rhinitis: Secondary | ICD-10-CM | POA: Insufficient documentation

## 2024-07-12 NOTE — Progress Notes (Signed)
 CC: Chronic nasal obstruction, recurrent sinusitis  HPI:  Hayley Padilla is a 73 y.o. female who presents today complaining of chronic nasal obstruction and recurrent sinusitis.  According to the patient, she has been symptomatic since April 2025.  She is chronically congested, with frequent nasal drainage and facial pressure.  She was diagnosed with sinusitis, and was treated with 2 courses of antibiotics.  Due to her chronic nasal obstruction, she has been using oxymetazoline nasal spray regularly.  She has no known environmental allergies.  She has no previous ENT surgeries.  Currently she denies any fever or visual change.  Past Medical History:  Diagnosis Date   Allergy    Arthritis    Diabetes mellitus without complication (HCC)    Hyperlipidemia    Hypertension    PONV (postoperative nausea and vomiting)     Past Surgical History:  Procedure Laterality Date   BACK SURGERY     lower back   IR LUMBAR DISC ASPIRATION W/IMG GUIDE  01/15/2022   LAMINECTOMY WITH POSTERIOR LATERAL ARTHRODESIS LEVEL 1 N/A 03/16/2022   Procedure: Lumbar One-Two Posterior Lateral Fusion with pedicle screws;  Surgeon: Louis Shove, MD;  Location: [facility] OR;  Service: Neurosurgery;  Laterality: N/A;   MULTIPLE TOOTH EXTRACTIONS      Family History  Problem Relation Age of Onset   Colon cancer Neg Hx    Colon polyps Neg Hx    Esophageal cancer Neg Hx    Rectal cancer Neg Hx    Stomach cancer Neg Hx     Social History:  reports that she has never smoked. Her smokeless tobacco use includes chew. She reports that she does not currently use alcohol. She reports that she does not use drugs.  Allergies:  Allergies  Allergen Reactions   Penicillins Anaphylaxis    Did it involve swelling of the face/tongue/throat, SOB, or low BP? Yes Did it involve sudden or severe rash/hives, skin peeling, or any reaction on the inside of your mouth or nose? No Did you need to seek medical attention at a hospital or doctor's  office? Yes When did it last happen?      Been a While  If all above answers are "NO", may proceed with cephalosporin use.    Aspirin Other (See Comments)    brusing _ patient requested to be listed    Prior to Admission medications   Medication Sig Start Date End Date Taking? Authorizing Provider  acetaminophen  (TYLENOL ) 650 MG CR tablet Take 650 mg by mouth every 8 (eight) hours as needed for pain.   Yes [provider]  amLODipine  (NORVASC ) 10 MG tablet Take 1 tablet (10 mg total) by mouth daily. 07/07/24 10/05/24 Yes Lorren, Amy J, NP  benzonatate  (TESSALON ) 100 MG capsule Take 1 capsule (100 mg total) by mouth 3 (three) times daily as needed for cough. 02/14/24  Yes Baxley, Ronal PARAS, MD  cloNIDine  (CATAPRES ) 0.1 MG tablet Take 1 tablet (0.1 mg total) by mouth 2 (two) times daily. 07/07/24 10/05/24 Yes Lorren, Amy J, NP  Continuous Glucose Receiver (DEXCOM G6 RECEIVER) DEVI 1 each by Other route 4 (four) times daily -  before meals and at bedtime. 12/27/23  Yes Lorren, Amy J, NP  Continuous Glucose Receiver (FREESTYLE LIBRE 2 READER) DEVI 1 each by Other route 4 (four) times daily -  before meals and at bedtime. 07/07/24  Yes Lorren, Amy J, NP  Continuous Glucose Sensor (DEXCOM G6 SENSOR) MISC 1 each by Other route 4 (four)  times daily -  before meals and at bedtime. 12/27/23  Yes Lorren, Amy J, NP  Continuous Glucose Sensor (FREESTYLE LIBRE 2 PLUS SENSOR) MISC 1 each by Other route 4 (four) times daily -  before meals and at bedtime. 07/07/24  Yes Lorren, Amy J, NP  Continuous Glucose Transmitter (DEXCOM G6 TRANSMITTER) MISC 1 each by Other route 4 (four) times daily -  before meals and at bedtime. 12/27/23  Yes Lorren, Amy J, NP  diclofenac  (VOLTAREN ) 75 MG EC tablet Take 1 tablet (75 mg total) by mouth 2 (two) times daily. 07/07/24 10/05/24 Yes Lorren, Amy J, NP  fluticasone  (FLONASE ) 50 MCG/ACT nasal spray Place 2 sprays into both nostrils daily. 07/11/24 08/10/24 Yes Karis Clunes, MD  furosemide  (LASIX ) 40 MG tablet Take 40 mg by mouth daily.   Yes [provider]  gabapentin  (NEURONTIN ) 300 MG capsule Take 1 capsule (300 mg total) by mouth 4 (four) times daily. 07/07/24 10/05/24 Yes Lorren, Amy J, NP  guaiFENesin  200 MG tablet Take 1 tablet (200 mg total) by mouth every 4 (four) hours as needed for cough or to loosen phlegm. 03/08/24  Yes Lorren, Amy J, NP  Iron , Ferrous Sulfate , 325 (65 Fe) MG TABS Take 325 mg by mouth daily. 09/24/23  Yes Lorren, Amy J, NP  losartan  (COZAAR ) 100 MG tablet Take 1 tablet (100 mg total) by mouth daily. 07/07/24  Yes Lorren, Amy J, NP  lovastatin  (MEVACOR ) 40 MG tablet Take 1 tablet (40 mg total) by mouth at bedtime. 07/07/24  Yes Lorren, Amy J, NP  oxyCODONE -acetaminophen  (PERCOCET) 7.5-325 MG tablet Take 1 tablet by mouth 4 (four) times daily as needed. 06/14/24  Yes Debby Fidela CROME, NP  polyethylene glycol (MIRALAX  / GLYCOLAX ) 17 g packet Take 17 g by mouth daily as needed. 09/24/23  Yes Lorren, Amy J, NP  potassium chloride  (KLOR-CON ) 10 MEQ tablet Take 1 tablet (10 mEq total) by mouth daily. 07/07/24 10/05/24 Yes Stephens, Amy J, NP  senna-docusate (SENOKOT-S) 8.6-50 MG tablet Take 1 tablet by mouth at bedtime. 01/20/22  Yes Ezenduka, Nkeiruka J, MD  sitaGLIPtin  (JANUVIA ) 25 MG tablet Take 1 tablet (25 mg total) by mouth daily. 07/07/24 10/05/24 Yes Lorren, Amy J, NP  Vitamin D , Ergocalciferol , (DRISDOL ) 1.25 MG (50000 UNIT) CAPS capsule Take 1 capsule (50,000 Units total) by mouth every 7 (seven) days for 12 doses. 05/03/24 07/20/24 Yes Lorren, Amy J, NP  Vitamin D , Ergocalciferol , (DRISDOL ) 1.25 MG (50000 UNIT) CAPS capsule Take 1 capsule (50,000 Units total) by mouth every Monday for 12 doses. 07/10/24 09/26/24 Yes Lorren, Amy J, NP  buprenorphine  (BUTRANS ) 10 MCG/HR PTWK  08/20/23   [provider]    Blood pressure (!) 145/75, pulse 80, height 5' (1.524 m), weight 204 lb (92.5 kg), SpO2 98%. Exam: General:  Communicates without difficulty, well nourished, no acute distress. Head: Normocephalic, no evidence injury, no tenderness, facial buttresses intact without stepoff. Face/sinus: No tenderness to palpation and percussion. Facial movement is normal and symmetric. Eyes: PERRL, EOMI. No scleral icterus, conjunctivae clear. Neuro: CN II exam reveals vision grossly intact.  No nystagmus at any point of gaze. Ears: Auricles well formed without lesions.  Ear canals are intact without mass or lesion.  No erythema or edema is appreciated.  The TMs are intact without fluid. Nose: External evaluation reveals normal support and skin without lesions.  Dorsum is intact.  Anterior rhinoscopy reveals congested mucosa over anterior aspect of inferior turbinates and deviated septum.  No purulence  noted. Oral:  Oral cavity and oropharynx are intact, symmetric, without erythema or edema.  Mucosa is moist without lesions. Neck: Full range of motion without pain.  There is no significant lymphadenopathy.  No masses palpable.  Thyroid  bed within normal limits to palpation.  Parotid glands and submandibular glands equal bilaterally without mass.  Trachea is midline. Neuro:  CN 2-12 grossly intact.   Procedure:  Flexible Nasal Endoscopy: Description: Risks, benefits, and alternatives of flexible endoscopy were explained to the patient.  Specific mention was made of the risk of throat numbness with difficulty swallowing, possible bleeding from the nose and mouth, and pain from the procedure.  The patient gave oral consent to proceed.  The flexible scope was inserted into the right nasal cavity.  Endoscopy of the interior nasal cavity, superior, inferior, and middle meatus was performed. The sphenoid-ethmoid recess was examined. Edematous mucosa was noted.  No polyp, mass, or lesion was appreciated. Nasal septal deviation noted. Olfactory cleft was clear.  Nasopharynx was clear.  Turbinates were hypertrophied but without mass.  The procedure  was repeated on the contralateral side with similar findings.  The patient tolerated the procedure well.    Assessment: 1.  Chronic rhinitis with nasal mucosal congestion, nasal septal deviation, and bilateral inferior turbinate hypertrophy.  More than 95% of her nasal passageways are obstructed bilaterally. 2.  No acute infection or purulent drainage is noted today.  Plan: 1.  The physical exam and nasal endoscopy findings are reviewed with the patient. 2.  The patient is reassured that no acute infection is noted today. 3.  The patient is encouraged to stop the use of oxymetazoline nasal spray. 4.  Flonase  nasal spray 2 sprays each nostril daily. 5.  Nasal saline irrigation daily. 6.  The patient will return for reevaluation in 1 month.  Cleo Villamizar W Kellan Boehlke 07/12/2024, 10:15 AM

## 2024-07-14 ENCOUNTER — Encounter: Attending: Registered Nurse | Admitting: Physical Medicine & Rehabilitation

## 2024-07-14 ENCOUNTER — Encounter: Payer: Self-pay | Admitting: Physical Medicine & Rehabilitation

## 2024-07-14 VITALS — BP 112/72 | HR 72 | Ht 60.0 in | Wt 208.0 lb

## 2024-07-14 DIAGNOSIS — Z79891 Long term (current) use of opiate analgesic: Secondary | ICD-10-CM | POA: Diagnosis not present

## 2024-07-14 DIAGNOSIS — Z5181 Encounter for therapeutic drug level monitoring: Secondary | ICD-10-CM | POA: Diagnosis not present

## 2024-07-14 DIAGNOSIS — M1711 Unilateral primary osteoarthritis, right knee: Secondary | ICD-10-CM | POA: Diagnosis not present

## 2024-07-14 DIAGNOSIS — M1712 Unilateral primary osteoarthritis, left knee: Secondary | ICD-10-CM | POA: Insufficient documentation

## 2024-07-14 DIAGNOSIS — M961 Postlaminectomy syndrome, not elsewhere classified: Secondary | ICD-10-CM | POA: Insufficient documentation

## 2024-07-14 DIAGNOSIS — M5416 Radiculopathy, lumbar region: Secondary | ICD-10-CM | POA: Insufficient documentation

## 2024-07-14 DIAGNOSIS — G894 Chronic pain syndrome: Secondary | ICD-10-CM | POA: Diagnosis not present

## 2024-07-14 MED ORDER — OXYCODONE-ACETAMINOPHEN 7.5-325 MG PO TABS
1.0000 | ORAL_TABLET | Freq: Four times a day (QID) | ORAL | 0 refills | Status: DC | PRN
Start: 2024-07-14 — End: 2024-08-10

## 2024-07-14 NOTE — Progress Notes (Signed)
 Subjective:    Patient ID: Hayley Padilla, female    DOB: 11-03-1951, 73 y.o.   MRN: [id number] 73 year old female with history of lumbar fusion who developed osteomyelitis approximately 1 year ago.  She was treated with IV antibiotics had lumbar fusion L1 to due to vertebral collapse related to her osteomyelitis.  She is now free of infection.  She has continued low back pain.  She has no significant pain going down her lower extremities.  She does have bilateral knee pain as well due to OA. She was originally seen at [facility] pain center for chronic pain management had been on oxycodone  10 mg 4 times daily.  Because of insurance issues she was referred to this office.  HPI 73 year old female with chronic low back pain failed lumbar fusion in terms of helping with back pain.  She does not have any lower extremity pain.  His pain is mainly in the buttock and not so much in the lumbar area.  She points to her lower gluteal near the ischial area.  She has had no falls or trauma.  Now has been on Oxy IR 7.5mg  QID last filled on 06/21/2024.  Last UDS  Pills from bottle got wet , requesting early refill by 07/18/2024, will be out of town until Aug 13th .    Brought remaining pills for pill count but states she threw some away   Pain Inventory Average Pain 8 Pain Right Now 9 My pain is dull  In the last 24 hours, has pain interfered with the following? General activity 5 Relation with others 0 Enjoyment of life 0 What TIME of day is your pain at its worst? daytime Sleep (in general) Fair  Pain is worse with: walking and standing Pain improves with: heat/ice and medication Relief from Meds: 6  Family History  Problem Relation Age of Onset   Colon cancer Neg Hx    Colon polyps Neg Hx    Esophageal cancer Neg Hx    Rectal cancer Neg Hx    Stomach cancer Neg Hx    Social History   Socioeconomic History   Marital status: Widowed    Spouse name: Not on file   Number of children: Not on  file   Years of education: Not on file   Highest education level: Not on file  Occupational History   Not on file  Tobacco Use   Smoking status: Never   Smokeless tobacco: Current    Types: Chew   Tobacco comments:    last used 03/17/2020 @ 2200  Vaping Use   Vaping status: Never Used  Substance and Sexual Activity   Alcohol use: Not Currently   Drug use: Never   Sexual activity: Not on file  Other Topics Concern   Not on file  Social History Narrative   Not on file   Social Drivers of Health   Financial Resource Strain: Low Risk  (03/27/2024)   Overall Financial Resource Strain (CARDIA)    Difficulty of Paying Living Expenses: Not hard at all  Food Insecurity: No Food Insecurity (03/27/2024)   Hunger Vital Sign    Worried About Running Out of Food in the Last Year: Never true    Ran Out of Food in the Last Year: Never true  Transportation Needs: No Transportation Needs (03/27/2024)   PRAPARE - Administrator, Civil Service (Medical): No    Lack of Transportation (Non-Medical): No  Physical Activity: Inactive (03/27/2024)   Exercise Vital  Sign    Days of Exercise per Week: 0 days    Minutes of Exercise per Session: 0 min  Stress: No Stress Concern Present (03/27/2024)   [employer] of Occupational Health - Occupational Stress Questionnaire    Feeling of Stress : Only a little  Social Connections: Moderately Isolated (03/27/2024)   Social Connection and Isolation Panel    Frequency of Communication with Friends and Family: More than three times a week    Frequency of Social Gatherings with Friends and Family: Twice a week    Attends Religious Services: More than 4 times per year    Active Member of [employer] or Organizations: No    Attends Banker Meetings: Never    Marital Status: Widowed   Past Surgical History:  Procedure Laterality Date   BACK SURGERY     lower back   IR LUMBAR DISC ASPIRATION W/IMG GUIDE  01/15/2022   LAMINECTOMY WITH  POSTERIOR LATERAL ARTHRODESIS LEVEL 1 N/A 03/16/2022   Procedure: Lumbar One-Two Posterior Lateral Fusion with pedicle screws;  Surgeon: Louis Shove, MD;  Location: [facility] OR;  Service: Neurosurgery;  Laterality: N/A;   MULTIPLE TOOTH EXTRACTIONS     Past Surgical History:  Procedure Laterality Date   BACK SURGERY     lower back   IR LUMBAR DISC ASPIRATION W/IMG GUIDE  01/15/2022   LAMINECTOMY WITH POSTERIOR LATERAL ARTHRODESIS LEVEL 1 N/A 03/16/2022   Procedure: Lumbar One-Two Posterior Lateral Fusion with pedicle screws;  Surgeon: Louis Shove, MD;  Location: [facility] OR;  Service: Neurosurgery;  Laterality: N/A;   MULTIPLE TOOTH EXTRACTIONS     Past Medical History:  Diagnosis Date   Allergy    Arthritis    Diabetes mellitus without complication (HCC)    Hyperlipidemia    Hypertension    PONV (postoperative nausea and vomiting)    BP 112/72   Pulse 72   Ht 5' (1.524 m)   Wt 208 lb (94.3 kg)   SpO2 98%   BMI 40.62 kg/m   Opioid Risk Score:   Fall Risk Score:  `1  Depression screen [facility] 2/9     07/07/2024    3:09 PM 06/06/2024    3:14 PM 03/27/2024    1:24 PM 02/17/2024    2:41 PM 01/17/2024    1:13 PM 12/23/2023   10:03 AM 10/20/2023    1:07 PM  Depression screen PHQ 2/9  Decreased Interest 0 0 0 0 0 0 0  Down, Depressed, Hopeless 0 2 0 0 0 0 0  PHQ - 2 Score 0 2 0 0 0 0 0  Altered sleeping  0       Tired, decreased energy  0       Change in appetite  1       Feeling bad or failure about yourself   0       Trouble concentrating  0       Moving slowly or fidgety/restless  1       Suicidal thoughts  0       PHQ-9 Score  4       Difficult doing work/chores  Somewhat difficult          Review of Systems  Musculoskeletal:  Positive for back pain.  All other systems reviewed and are negative.      Objective:   Physical Exam  General No acute distress Mood and affect appropriate Back has no tenderness palpation lumbar paraspinal she has flatback. She tends  to stay in a forward  flexed posture at the hips. She ambulates with a walker poor foot clearance right greater than left side. Speech without dysarthria or aphasia. She is oriented and alert Lower extremity strength is plus bilateral flexor knee extensor dorsiflexor       Assessment & Plan:  1.  Lumbar postlaminectomy syndrome with chronic pain. The patient has been compliant with medication management and her last UDS on 06/14/2024 was consistent. She got her pills wet and she brought some of them in to show us .  Some of the pills disintegrated according to her report and had to be discarded.  Her next refill would have been on 07/22/2024.  She is requesting a refill on 07/18/2024 prior to leaving on a trip where she will be traveling out of state until 07/26/2024. I have sent a message to the pharmacy authorizing early refill on 07/18/2024.  I did discuss that the pharmacy may have some insurance issues related to above. I have recommended follow-up with nurse practitioner in approximately 4 weeks, will get UDS at next visit

## 2024-07-19 ENCOUNTER — Encounter: Admitting: Registered Nurse

## 2024-07-24 ENCOUNTER — Encounter: Admitting: Obstetrics and Gynecology

## 2024-07-25 ENCOUNTER — Ambulatory Visit (INDEPENDENT_AMBULATORY_CARE_PROVIDER_SITE_OTHER): Admitting: Podiatry

## 2024-07-25 VITALS — Ht 60.0 in | Wt 208.0 lb

## 2024-07-25 DIAGNOSIS — M19071 Primary osteoarthritis, right ankle and foot: Secondary | ICD-10-CM

## 2024-07-25 DIAGNOSIS — M19072 Primary osteoarthritis, left ankle and foot: Secondary | ICD-10-CM | POA: Diagnosis not present

## 2024-07-25 DIAGNOSIS — M25571 Pain in right ankle and joints of right foot: Secondary | ICD-10-CM

## 2024-07-25 DIAGNOSIS — M25572 Pain in left ankle and joints of left foot: Secondary | ICD-10-CM | POA: Diagnosis not present

## 2024-07-25 NOTE — Patient Instructions (Signed)

## 2024-07-26 ENCOUNTER — Other Ambulatory Visit: Payer: Self-pay | Admitting: Family

## 2024-07-26 NOTE — Progress Notes (Signed)
  Subjective:  Patient ID: Hayley Padilla, female    DOB: 1951/10/29,  MRN: [id number]  Chief Complaint  Patient presents with   Foot Problem    Rm 2 Patient is here as a follow-up on sinus tarsi syndrome of right ankle. Patient states pain and discomfort when laying down in the right leg and ankle. Patient states steroid injection at last visit (05/30/24) did not help to alleviate the pain.    73 y.o. female presents with the above complaint. History confirmed with patient.  Pain has not improved the injection did not help  Objective:  Physical Exam: warm, good capillary refill, no trophic changes or ulcerative lesions, normal DP and PT pulses, normal sensory exam, and today she has primarily pain on palpation of the sinus tarsi and with range of motion of the subtalar joint bilaterally but the right is worse than the left.   Radiographs: Multiple views x-ray of both ankles mild to moderate degenerative changes in the ankle joint Assessment:   1. Sinus tarsi syndrome of right ankle   2. Sinus tarsi syndrome of left ankle   3. Arthritis of both ankles       Plan:  Patient was evaluated and treated and all questions answered.  Has had little to no improvement after corticosteroid injection.  I recommend MRI to evaluate the sinus tarsi as well as the subtalar joint and ankle joint for any abnormalities considering her little improvement as well as the soft tissue structures around including peroneal tendons.  Order has been placed for the MRIs of the bilateral ankle and hindfoot and return to follow-up with me as needed afterwards.  Return for after MRI to review.

## 2024-07-26 NOTE — Telephone Encounter (Unsigned)
 Copied from CRM #[id number]. Topic: Clinical - Medication Refill >> Jul 26, 2024 12:02 PM Deleta RAMAN wrote: Medication: urosemide (LASIX ) 40 MG tablet  Has the patient contacted their pharmacy? Yes (Agent: If no, request that the patient contact the pharmacy for the refill. If patient does not wish to contact the pharmacy document the reason why and proceed with request.) (Agent: If yes, when and what did the pharmacy advise?)  This is the patient's preferred pharmacy:  [facility] [address], [place] - [address] [place] [address] Phone: [phone] Fax: [phone]  Is this the correct pharmacy for this prescription? Yes If no, delete pharmacy and type the correct one.   Has the prescription been filled recently? No  Is the patient out of the medication? Yes  Has the patient been seen for an appointment in the last year OR does the patient have an upcoming appointment? Yes  Can we respond through MyChart? No  Agent: Please be advised that Rx refills may take up to 3 business days. We ask that you follow-up with your pharmacy.

## 2024-07-28 NOTE — Telephone Encounter (Signed)
 Requested medications are due for refill today.  unsure  Requested medications are on the active medications list.  yes  Last refill. 01/15/2022  Future visit scheduled.   yes  Notes to clinic.  Medication is historical  - last filled 2023.    Requested Prescriptions  Pending Prescriptions Disp Refills   furosemide  (LASIX ) 40 MG tablet 30 tablet     Sig: Take 1 tablet (40 mg total) by mouth daily.     Cardiovascular:  Diuretics - Loop Failed - 07/28/2024  2:54 PM      Failed - Cl in normal range and within 180 days    Chloride  Date Value Ref Range Status  03/27/2024 107 (H) 96 - 106 mmol/L Final         Failed - Mg Level in normal range and within 180 days    Magnesium   Date Value Ref Range Status  03/12/2022 2.0 1.7 - 2.4 mg/dL Final    Comment:    Performed at [facility], [address], [place], [place] [address]         Passed - K in normal range and within 180 days    Potassium  Date Value Ref Range Status  03/27/2024 3.9 3.5 - 5.2 mmol/L Final         Passed - Ca in normal range and within 180 days    Calcium   Date Value Ref Range Status  03/27/2024 8.9 8.7 - 10.3 mg/dL Final         Passed - Na in normal range and within 180 days    Sodium  Date Value Ref Range Status  03/27/2024 142 134 - 144 mmol/L Final         Passed - Cr in normal range and within 180 days    Creat  Date Value Ref Range Status  03/10/2022 0.63 0.60 - 1.00 mg/dL Final   Creatinine, Ser  Date Value Ref Range Status  03/27/2024 0.58 0.57 - 1.00 mg/dL Final         Passed - Last BP in normal range    BP Readings from Last 1 Encounters:  07/14/24 112/72         Passed - Valid encounter within last 6 months    Recent Outpatient Visits           3 weeks ago Primary hypertension   [place] [facility], [place], NP   1 month ago Sinusitis, unspecified chronicity, unspecified location   [facility],  [place], NP   2 months ago Primary hypertension    [facility] at [facility], [place], NP   4 months ago Annual physical exam   [facility], Amy J, NP   4 months ago Sinusitis, unspecified chronicity, unspecified location   [facility] at [facility], Greig PARAS, NP

## 2024-08-01 NOTE — Telephone Encounter (Signed)
 Furosemide  appears as a historical medication. Report to Emergency Department/Urgent Care/call 911 for immediate medical evaluation. Follow-up with Primary Care.

## 2024-08-02 NOTE — Telephone Encounter (Signed)
 Furosemide  appears as a historical medication. Report to Emergency Department/Urgent Care/call 911 for immediate medical evaluation. Follow-up with Primary Care.

## 2024-08-03 ENCOUNTER — Telehealth: Payer: Self-pay | Admitting: Emergency Medicine

## 2024-08-03 NOTE — Telephone Encounter (Signed)
 Pt scheduled

## 2024-08-03 NOTE — Telephone Encounter (Signed)
 I called and made patient aware of PCP recommendations for her \\lasix  and patient stated she does not want to go any where else and she will schedule an appointment to come in.

## 2024-08-04 ENCOUNTER — Encounter: Payer: Self-pay | Admitting: Family

## 2024-08-04 ENCOUNTER — Ambulatory Visit (INDEPENDENT_AMBULATORY_CARE_PROVIDER_SITE_OTHER): Admitting: Family

## 2024-08-04 VITALS — BP 116/73 | HR 75 | Temp 98.3°F | Resp 16 | Ht 60.0 in | Wt 202.4 lb

## 2024-08-04 DIAGNOSIS — M25471 Effusion, right ankle: Secondary | ICD-10-CM | POA: Diagnosis not present

## 2024-08-04 DIAGNOSIS — M25472 Effusion, left ankle: Secondary | ICD-10-CM | POA: Diagnosis not present

## 2024-08-04 MED ORDER — FUROSEMIDE 20 MG PO TABS: 20.0000 mg | ORAL_TABLET | Freq: Every day | ORAL | 0 refills | Status: DC | PRN

## 2024-08-04 NOTE — Progress Notes (Signed)
 Patient ID: Hayley Padilla, female    DOB: 02/22/1951  MRN: [id number]  CC: Ankle Swelling  Subjective: Hayley Padilla is a 73 y.o. female who presents for ankle swelling.   Her concerns today include:  Bilateral ankle swelling. Denies recent trauma/injury and red flag symptoms. States in the past Furosemide  helped. States she is scheduled for MRI of bilateral ankles on next month.  Patient Active Problem List   Diagnosis Date Noted   Chronic rhinitis 07/12/2024   Deviated nasal septum 07/12/2024   Hypertrophy of nasal turbinates 07/12/2024   Diabetes mellitus, type 2 (HCC) 06/24/2023   Vertebral osteomyelitis (HCC) 03/12/2022   Hypokalemia 03/12/2022   Edema of right lower extremity 01/15/2022   Essential hypertension 01/14/2022   Chronic pain 01/14/2022   HLD (hyperlipidemia) 01/14/2022   Discitis 01/14/2022   Obesity (BMI 30-39.9) 01/13/2022   S/P lumbar spinal fusion 01/13/2022   Discitis of lumbar region 01/13/2022   Kyphosis 01/13/2022   Degenerative spondylolisthesis 06/12/2019     Current Outpatient Medications on File Prior to Visit  Medication Sig Dispense Refill   acetaminophen  (TYLENOL ) 650 MG CR tablet Take 650 mg by mouth every 8 (eight) hours as needed for pain.     amLODipine  (NORVASC ) 10 MG tablet Take 1 tablet (10 mg total) by mouth daily. 90 tablet 0   benzonatate  (TESSALON ) 100 MG capsule Take 1 capsule (100 mg total) by mouth 3 (three) times daily as needed for cough. 30 capsule 0   buprenorphine  (BUTRANS ) 10 MCG/HR PTWK      cloNIDine  (CATAPRES ) 0.1 MG tablet Take 1 tablet (0.1 mg total) by mouth 2 (two) times daily. 180 tablet 0   Continuous Glucose Receiver (DEXCOM G6 RECEIVER) DEVI 1 each by Other route 4 (four) times daily -  before meals and at bedtime. 1 each 2   Continuous Glucose Receiver (FREESTYLE LIBRE 2 READER) DEVI 1 each by Other route 4 (four) times daily -  before meals and at bedtime. 1 each 2   Continuous Glucose Sensor (DEXCOM G6  SENSOR) MISC 1 each by Other route 4 (four) times daily -  before meals and at bedtime. 3 each 2   Continuous Glucose Sensor (FREESTYLE LIBRE 2 PLUS SENSOR) MISC 1 each by Other route 4 (four) times daily -  before meals and at bedtime. 1 each 2   Continuous Glucose Transmitter (DEXCOM G6 TRANSMITTER) MISC 1 each by Other route 4 (four) times daily -  before meals and at bedtime. 1 each 2   diclofenac  (VOLTAREN ) 75 MG EC tablet Take 1 tablet (75 mg total) by mouth 2 (two) times daily. 180 tablet 0   fluticasone  (FLONASE ) 50 MCG/ACT nasal spray Place 2 sprays into both nostrils daily. 16 g 10   gabapentin  (NEURONTIN ) 300 MG capsule Take 1 capsule (300 mg total) by mouth 4 (four) times daily. 120 capsule 2   guaiFENesin  200 MG tablet Take 1 tablet (200 mg total) by mouth every 4 (four) hours as needed for cough or to loosen phlegm. 30 tablet 1   Iron , Ferrous Sulfate , 325 (65 Fe) MG TABS Take 325 mg by mouth daily. 90 tablet 0   losartan  (COZAAR ) 100 MG tablet Take 1 tablet (100 mg total) by mouth daily. 90 tablet 0   lovastatin  (MEVACOR ) 40 MG tablet Take 1 tablet (40 mg total) by mouth at bedtime. 90 tablet 0   oxyCODONE -acetaminophen  (PERCOCET) 7.5-325 MG tablet Take 1 tablet by mouth 4 (four) times daily as needed.  120 tablet 0   polyethylene glycol (MIRALAX  / GLYCOLAX ) 17 g packet Take 17 g by mouth daily as needed. 90 each 0   potassium chloride  (KLOR-CON ) 10 MEQ tablet Take 1 tablet (10 mEq total) by mouth daily. 90 tablet 0   senna-docusate (SENOKOT-S) 8.6-50 MG tablet Take 1 tablet by mouth at bedtime.     sitaGLIPtin  (JANUVIA ) 25 MG tablet Take 1 tablet (25 mg total) by mouth daily. 90 tablet 0   Vitamin D , Ergocalciferol , (DRISDOL ) 1.25 MG (50000 UNIT) CAPS capsule Take 1 capsule (50,000 Units total) by mouth every Monday for 12 doses. 12 capsule 0   No current facility-administered medications on file prior to visit.    Allergies  Allergen Reactions   Penicillins Anaphylaxis    Did  it involve swelling of the face/tongue/throat, SOB, or low BP? Yes Did it involve sudden or severe rash/hives, skin peeling, or any reaction on the inside of your mouth or nose? No Did you need to seek medical attention at a hospital or doctor's office? Yes When did it last happen?      Been a While  If all above answers are "NO", may proceed with cephalosporin use.    Aspirin Other (See Comments)    brusing _ patient requested to be listed    Social History   Socioeconomic History   Marital status: Widowed    Spouse name: Not on file   Number of children: Not on file   Years of education: Not on file   Highest education level: Not on file  Occupational History   Not on file  Tobacco Use   Smoking status: Never   Smokeless tobacco: Current    Types: Chew   Tobacco comments:    last used 03/17/2020 @ 2200  Vaping Use   Vaping status: Never Used  Substance and Sexual Activity   Alcohol use: Not Currently   Drug use: Never   Sexual activity: Not Currently  Other Topics Concern   Not on file  Social History Narrative   Not on file   Social Drivers of Health   Financial Resource Strain: Low Risk  (03/27/2024)   Overall Financial Resource Strain (CARDIA)    Difficulty of Paying Living Expenses: Not hard at all  Food Insecurity: No Food Insecurity (03/27/2024)   Hunger Vital Sign    Worried About Running Out of Food in the Last Year: Never true    Ran Out of Food in the Last Year: Never true  Transportation Needs: No Transportation Needs (03/27/2024)   PRAPARE - Administrator, Civil Service (Medical): No    Lack of Transportation (Non-Medical): No  Physical Activity: Inactive (03/27/2024)   Exercise Vital Sign    Days of Exercise per Week: 0 days    Minutes of Exercise per Session: 0 min  Stress: No Stress Concern Present (03/27/2024)   [employer] of Occupational Health - Occupational Stress Questionnaire    Feeling of Stress : Only a little  Social  Connections: Moderately Isolated (03/27/2024)   Social Connection and Isolation Panel    Frequency of Communication with Friends and Family: More than three times a week    Frequency of Social Gatherings with Friends and Family: Twice a week    Attends Religious Services: More than 4 times per year    Active Member of [employer] or Organizations: No    Attends Banker Meetings: Never    Marital Status: Widowed  Intimate Partner Violence:  Not At Risk (03/27/2024)   Humiliation, Afraid, Rape, and Kick questionnaire    Fear of Current or Ex-Partner: No    Emotionally Abused: No    Physically Abused: No    Sexually Abused: No    Family History  Problem Relation Age of Onset   Colon cancer Neg Hx    Colon polyps Neg Hx    Esophageal cancer Neg Hx    Rectal cancer Neg Hx    Stomach cancer Neg Hx     Past Surgical History:  Procedure Laterality Date   BACK SURGERY     lower back   IR LUMBAR DISC ASPIRATION W/IMG GUIDE  01/15/2022   LAMINECTOMY WITH POSTERIOR LATERAL ARTHRODESIS LEVEL 1 N/A 03/16/2022   Procedure: Lumbar One-Two Posterior Lateral Fusion with pedicle screws;  Surgeon: Louis Shove, MD;  Location: [facility] OR;  Service: Neurosurgery;  Laterality: N/A;   MULTIPLE TOOTH EXTRACTIONS      ROS: Review of Systems Negative except as stated above  PHYSICAL EXAM: BP 116/73   Pulse 75   Temp 98.3 F (36.8 C) (Oral)   Resp 16   Ht 5' (1.524 m)   Wt 202 lb 6.4 oz (91.8 kg)   SpO2 96%   BMI 39.53 kg/m   Physical Exam HENT:     Head: Normocephalic and atraumatic.     Nose: Nose normal.     Mouth/Throat:     Mouth: Mucous membranes are moist.     Pharynx: Oropharynx is clear.  Eyes:     Extraocular Movements: Extraocular movements intact.     Conjunctiva/sclera: Conjunctivae normal.     Pupils: Pupils are equal, round, and reactive to light.  Cardiovascular:     Rate and Rhythm: Normal rate and regular rhythm.     Pulses: Normal pulses.     Heart sounds: Normal  heart sounds.  Pulmonary:     Effort: Pulmonary effort is normal.     Breath sounds: Normal breath sounds.  Musculoskeletal:        General: Normal range of motion.     Cervical back: Normal range of motion and neck supple.     Right ankle: Swelling present.     Left ankle: Swelling present.     Comments: +1 edema bilateral ankles.  Neurological:     General: No focal deficit present.     Mental Status: She is alert and oriented to person, place, and time.  Psychiatric:        Mood and Affect: Mood normal.        Behavior: Behavior normal.     ASSESSMENT AND PLAN: 1. Ankle edema, bilateral (Primary) - Furosemide  as prescribed. Counseled on medication adherence/adverse effects.  - Follow-up with primary provider as scheduled. - furosemide  (LASIX ) 20 MG tablet; Take 1 tablet (20 mg total) by mouth daily as needed.  Dispense: 90 tablet; Refill: 0   Patient was given the opportunity to ask questions.  Patient verbalized understanding of the plan and was able to repeat key elements of the plan. Patient was given clear instructions to go to Emergency Department or return to medical center if symptoms don't improve, worsen, or new problems develop.The patient verbalized understanding.  Requested Prescriptions   Signed Prescriptions Disp Refills   furosemide  (LASIX ) 20 MG tablet 90 tablet 0    Sig: Take 1 tablet (20 mg total) by mouth daily as needed.    Return for Follow-up as needed.  Greig JINNY Drones, NP

## 2024-08-04 NOTE — Progress Notes (Signed)
 Patient right ankle and leg swelling patient having MRI on September 4th

## 2024-08-10 ENCOUNTER — Encounter: Admitting: Registered Nurse

## 2024-08-10 VITALS — BP 131/78 | HR 71 | Ht 60.0 in | Wt 203.0 lb

## 2024-08-10 DIAGNOSIS — Z5181 Encounter for therapeutic drug level monitoring: Secondary | ICD-10-CM | POA: Diagnosis not present

## 2024-08-10 DIAGNOSIS — M5416 Radiculopathy, lumbar region: Secondary | ICD-10-CM | POA: Diagnosis not present

## 2024-08-10 DIAGNOSIS — M1712 Unilateral primary osteoarthritis, left knee: Secondary | ICD-10-CM

## 2024-08-10 DIAGNOSIS — G894 Chronic pain syndrome: Secondary | ICD-10-CM

## 2024-08-10 DIAGNOSIS — Z79891 Long term (current) use of opiate analgesic: Secondary | ICD-10-CM | POA: Diagnosis not present

## 2024-08-10 DIAGNOSIS — M961 Postlaminectomy syndrome, not elsewhere classified: Secondary | ICD-10-CM

## 2024-08-10 DIAGNOSIS — M1711 Unilateral primary osteoarthritis, right knee: Secondary | ICD-10-CM

## 2024-08-10 MED ORDER — OXYCODONE-ACETAMINOPHEN 7.5-325 MG PO TABS: 1.0000 | ORAL_TABLET | Freq: Four times a day (QID) | ORAL | 0 refills | Status: DC | PRN

## 2024-08-10 NOTE — Progress Notes (Signed)
 Subjective:    Patient ID: Hayley Padilla, female    DOB: 1950-12-28, 73 y.o.   MRN: [id number]  HPI: Hayley Padilla is a 73 y.o. female who returns for follow up appointment for chronic pain and medication refill. She states her pain is located in her lower back radiating into her buttocks and bilateral knee pain R>L. She rates her pain 10. Her current exercise regime is walking and performing stretching exercises.   Ms. Stoneberg Morphine  equivalent is 45.00 MME.   Last Oral Swab was Performed 06/14/2024, it was consistent.    Pain Inventory Average Pain 9 Pain Right Now 10 My pain is constant and dull  In the last 24 hours, has pain interfered with the following? General activity 9 Relation with others 9 Enjoyment of life 9 What TIME of day is your pain at its worst? daytime Sleep (in general) Fair  Pain is worse with: walking and standing Pain improves with: rest and medication Relief from Meds: 5  Family History  Problem Relation Age of Onset   Colon cancer Neg Hx    Colon polyps Neg Hx    Esophageal cancer Neg Hx    Rectal cancer Neg Hx    Stomach cancer Neg Hx    Social History   Socioeconomic History   Marital status: Widowed    Spouse name: Not on file   Number of children: Not on file   Years of education: Not on file   Highest education level: Not on file  Occupational History   Not on file  Tobacco Use   Smoking status: Never   Smokeless tobacco: Current    Types: Chew   Tobacco comments:    last used 03/17/2020 @ 2200  Vaping Use   Vaping status: Never Used  Substance and Sexual Activity   Alcohol use: Not Currently   Drug use: Never   Sexual activity: Not Currently  Other Topics Concern   Not on file  Social History Narrative   Not on file   Social Drivers of Health   Financial Resource Strain: Low Risk  (03/27/2024)   Overall Financial Resource Strain (CARDIA)    Difficulty of Paying Living Expenses: Not hard at all  Food Insecurity: No Food  Insecurity (03/27/2024)   Hunger Vital Sign    Worried About Running Out of Food in the Last Year: Never true    Ran Out of Food in the Last Year: Never true  Transportation Needs: No Transportation Needs (03/27/2024)   PRAPARE - Administrator, Civil Service (Medical): No    Lack of Transportation (Non-Medical): No  Physical Activity: Inactive (03/27/2024)   Exercise Vital Sign    Days of Exercise per Week: 0 days    Minutes of Exercise per Session: 0 min  Stress: No Stress Concern Present (03/27/2024)   [employer] of Occupational Health - Occupational Stress Questionnaire    Feeling of Stress : Only a little  Social Connections: Moderately Isolated (03/27/2024)   Social Connection and Isolation Panel    Frequency of Communication with Friends and Family: More than three times a week    Frequency of Social Gatherings with Friends and Family: Twice a week    Attends Religious Services: More than 4 times per year    Active Member of [employer] or Organizations: No    Attends Banker Meetings: Never    Marital Status: Widowed   Past Surgical History:  Procedure Laterality Date  BACK SURGERY     lower back   IR LUMBAR DISC ASPIRATION W/IMG GUIDE  01/15/2022   LAMINECTOMY WITH POSTERIOR LATERAL ARTHRODESIS LEVEL 1 N/A 03/16/2022   Procedure: Lumbar One-Two Posterior Lateral Fusion with pedicle screws;  Surgeon: Louis Shove, MD;  Location: [facility];  Service: Neurosurgery;  Laterality: N/A;   MULTIPLE TOOTH EXTRACTIONS     Past Surgical History:  Procedure Laterality Date   BACK SURGERY     lower back   IR LUMBAR DISC ASPIRATION W/IMG GUIDE  01/15/2022   LAMINECTOMY WITH POSTERIOR LATERAL ARTHRODESIS LEVEL 1 N/A 03/16/2022   Procedure: Lumbar One-Two Posterior Lateral Fusion with pedicle screws;  Surgeon: Louis Shove, MD;  Location: [facility] OR;  Service: Neurosurgery;  Laterality: N/A;   MULTIPLE TOOTH EXTRACTIONS     Past Medical History:  Diagnosis Date   Allergy     Arthritis    Diabetes mellitus without complication (HCC)    Hyperlipidemia    Hypertension    PONV (postoperative nausea and vomiting)    BP 131/78   Pulse 71   Ht 5' (1.524 m)   Wt 203 lb (92.1 kg)   SpO2 98%   BMI 39.65 kg/m   Opioid Risk Score:   Fall Risk Score:  `1  Depression screen [facility] 2/9     08/04/2024    9:36 AM 07/07/2024    3:09 PM 06/06/2024    3:14 PM 03/27/2024    1:24 PM 02/17/2024    2:41 PM 01/17/2024    1:13 PM 12/23/2023   10:03 AM  Depression screen PHQ 2/9  Decreased Interest 1 0 0 0 0 0 0  Down, Depressed, Hopeless 1 0 2 0 0 0 0  PHQ - 2 Score 2 0 2 0 0 0 0  Altered sleeping   0      Tired, decreased energy   0      Change in appetite   1      Feeling bad or failure about yourself    0      Trouble concentrating   0      Moving slowly or fidgety/restless   1      Suicidal thoughts   0      PHQ-9 Score   4      Difficult doing work/chores Somewhat difficult  Somewhat difficult          Review of Systems  Musculoskeletal:  Positive for back pain.       Right knee pain  All other systems reviewed and are negative.      Objective:   Physical Exam Vitals and nursing note reviewed.  Constitutional:      Appearance: Normal appearance.  Cardiovascular:     Rate and Rhythm: Normal rate and regular rhythm.     Pulses: Normal pulses.     Heart sounds: Normal heart sounds.  Pulmonary:     Effort: Pulmonary effort is normal.     Breath sounds: Normal breath sounds.  Musculoskeletal:     Comments: Normal Muscle Bulk and Muscle Testing Reveals:  Upper Extremities: Full ROM and Muscle Strength 5/5  Lower Extremities: Right: Decreased ROM and Muscle Strength 5/5 Right Lower Extremity Flexion Produces Pain into her Right Patella Left Lower extremity: Full ROM and Muscle Strength 5/5 Arises from Table Slowly using walker for support Antalgic Gait     Skin:    General: Skin is warm and dry.  Neurological:     Mental Status: She  is alert and  oriented to person, place, and time.  Psychiatric:        Mood and Affect: Mood normal.        Behavior: Behavior normal.          Assessment & Plan:  Lumbar Post Laminectomy Syndrome: Ms. Schmuhl underwent on 03/17/2023 : Dr Louis : Lumbar One-Two Posterior Lateral Fusion with pedicle screws  Continue HEP as Tolerated. Continue to Monitor. 08/10/2024 2. Lumbar Radiculitis: Continue Gabapentin . Continue to monitor. 08/10/2024 3. Chronic Bilateral Hip Pain: No complaints today. Continue HEP as Tolerated. Continue to Monitor. 08/10/2024 4. Chronic Pain of Bilateral Knees: R>LContinue HEP as Tolerated. Continue to Monitor. 08/10/2024 5. Chronic Pain Syndrome: Refilled: Oxycodone  7.5/325 mg one tablet 4 times a day as needed for pain #120.  We will continue the opioid monitoring program, this consists of regular clinic visits, examinations, urine drug screen, pill counts as well as use of [place]  Controlled Substance Reporting system. A 12 month History has been reviewed on the [place]  Controlled Substance Reporting System on 08/10/2024. 6, Left Shoulder pain: No complaints today. Continue to monitor.  08/10/2024 7. Bilateral Ankle Pain: No complaints today. Continue HEP as Tolerated. Continue to Monitor. 08/10/2024 F/U in 1 month

## 2024-08-16 LAB — DRUG TOX MONITOR 1 W/CONF, ORAL FLD
Amphetamines: NEGATIVE ng/mL (ref <10)
Barbiturates: NEGATIVE ng/mL (ref <10)
Benzodiazepines: NEGATIVE ng/mL (ref <0.50)
Buprenorphine: NEGATIVE ng/mL (ref <0.10)
Cocaine: NEGATIVE ng/mL (ref <5.0)
Codeine: NEGATIVE ng/mL (ref <2.5)
Cotinine: 145 ng/mL — ABNORMAL HIGH (ref <5.0)
Dihydrocodeine: NEGATIVE ng/mL (ref <2.5)
Fentanyl: NEGATIVE ng/mL (ref <0.10)
Heroin Metabolite: NEGATIVE ng/mL (ref <1.0)
Hydrocodone: NEGATIVE ng/mL (ref <2.5)
Hydromorphone: NEGATIVE ng/mL (ref <2.5)
MARIJUANA: NEGATIVE ng/mL (ref <2.5)
MDMA: NEGATIVE ng/mL (ref <10)
Meprobamate: NEGATIVE ng/mL (ref <2.5)
Methadone: NEGATIVE ng/mL (ref <5.0)
Morphine: NEGATIVE ng/mL (ref <2.5)
Nicotine Metabolite: POSITIVE ng/mL — AB (ref <5.0)
Norhydrocodone: NEGATIVE ng/mL (ref <2.5)
Noroxycodone: 7.38 ng/mL — ABNORMAL HIGH (ref <2.5)
Opiates: POSITIVE ng/mL — AB (ref <2.5)
Oxycodone: 40.8 ng/mL — ABNORMAL HIGH (ref <2.5)
Oxymorphone: NEGATIVE ng/mL (ref <2.5)
Phencyclidine: NEGATIVE ng/mL (ref <10)
Tapentadol: NEGATIVE ng/mL (ref <5.0)
Tramadol: NEGATIVE ng/mL (ref <5.0)
Zolpidem: NEGATIVE ng/mL (ref <5.0)

## 2024-08-16 LAB — DRUG TOX ALC METAB W/CON, ORAL FLD: Alcohol Metabolite: NEGATIVE ng/mL (ref <25)

## 2024-08-17 ENCOUNTER — Ambulatory Visit (INDEPENDENT_AMBULATORY_CARE_PROVIDER_SITE_OTHER): Admitting: Otolaryngology

## 2024-08-17 ENCOUNTER — Encounter (INDEPENDENT_AMBULATORY_CARE_PROVIDER_SITE_OTHER): Payer: Self-pay | Admitting: Otolaryngology

## 2024-08-17 ENCOUNTER — Ambulatory Visit
Admission: RE | Admit: 2024-08-17 | Discharge: 2024-08-17 | Disposition: A | Discharge status: Home / Self Care | Source: Ambulatory Visit | Attending: Podiatry

## 2024-08-17 ENCOUNTER — Ambulatory Visit
Admission: RE | Admit: 2024-08-17 | Discharge: 2024-08-17 | Disposition: A | Discharge status: Home / Self Care | Source: Ambulatory Visit | Attending: Podiatry | Admitting: Podiatry

## 2024-08-17 VITALS — BP 131/78 | HR 78

## 2024-08-17 DIAGNOSIS — M19071 Primary osteoarthritis, right ankle and foot: Secondary | ICD-10-CM

## 2024-08-17 DIAGNOSIS — J342 Deviated nasal septum: Secondary | ICD-10-CM

## 2024-08-17 DIAGNOSIS — M25572 Pain in left ankle and joints of left foot: Secondary | ICD-10-CM

## 2024-08-17 DIAGNOSIS — J343 Hypertrophy of nasal turbinates: Secondary | ICD-10-CM

## 2024-08-17 DIAGNOSIS — R0981 Nasal congestion: Secondary | ICD-10-CM

## 2024-08-17 DIAGNOSIS — J310 Chronic rhinitis: Secondary | ICD-10-CM | POA: Diagnosis not present

## 2024-08-17 DIAGNOSIS — M25571 Pain in right ankle and joints of right foot: Secondary | ICD-10-CM

## 2024-08-17 DIAGNOSIS — M19072 Primary osteoarthritis, left ankle and foot: Secondary | ICD-10-CM | POA: Diagnosis not present

## 2024-08-19 NOTE — Progress Notes (Signed)
 Patient ID: Hayley Padilla, female   DOB: 1951/07/05, 73 y.o.   MRN: [id number]  Follow-up: Chronic nasal obstruction  HPI: The patient is a 73 year old female who returns today for her follow-up evaluation.  She was last seen in July 2025.  At that time, she was complaining of chronic nasal obstruction.  She was noted to have chronic rhinitis, with nasal septal deviation and bilateral inferior turbinate hypertrophy.  The patient was treated with Flonase  nasal spray and nasal saline irrigation.  The patient returns today reporting mild improvement in her symptoms.  She is still congested most of the time.  However, the severity has slightly decreased.  She denies any facial pain, fever, or visual change.  Exam: General: Communicates without difficulty, well nourished, no acute distress. Head: Normocephalic, no evidence injury, no tenderness, facial buttresses intact without stepoff. Face/sinus: No tenderness to palpation and percussion. Facial movement is normal and symmetric. Eyes: PERRL, EOMI. No scleral icterus, conjunctivae clear. Neuro: CN II exam reveals vision grossly intact.  No nystagmus at any point of gaze. Ears: Auricles well formed without lesions.  Ear canals are intact without mass or lesion.  No erythema or edema is appreciated.  The TMs are intact without fluid. Nose: External evaluation reveals normal support and skin without lesions.  Dorsum is intact.  Anterior rhinoscopy reveals congested mucosa over anterior aspect of inferior turbinates and deviated septum.  No purulence noted. Oral:  Oral cavity and oropharynx are intact, symmetric, without erythema or edema.  Mucosa is moist without lesions. Neck: Full range of motion without pain.  There is no significant lymphadenopathy.  No masses palpable.  Thyroid  bed within normal limits to palpation.  Parotid glands and submandibular glands equal bilaterally without mass.  Trachea is midline. Neuro:  CN 2-12 grossly intact.   Assessment: 1.   Chronic rhinitis with nasal mucosal congestion, nasal septal deviation, and bilateral inferior turbinate hypertrophy.  The severity of the nasal congestion has slightly decreased. 2.  No acute infection is noted today.  Plan: 1.  The physical exam findings are reviewed with the patient. 2.  Continue with Flonase  nasal spray and nasal saline irrigation daily. 3.  The surgical option of septoplasty and turbinate reduction is also discussed with the patient.  Questions are invited and answered. 4.  The patient would like to continue with medical management for now.  She will return for reevaluation in 3 months.

## 2024-08-20 ENCOUNTER — Encounter: Payer: Self-pay | Admitting: Registered Nurse

## 2024-09-06 ENCOUNTER — Other Ambulatory Visit: Payer: Self-pay | Admitting: Family

## 2024-09-06 DIAGNOSIS — E559 Vitamin D deficiency, unspecified: Secondary | ICD-10-CM

## 2024-09-07 NOTE — Telephone Encounter (Signed)
 Complete

## 2024-09-11 ENCOUNTER — Encounter: Payer: Self-pay | Admitting: Registered Nurse

## 2024-09-11 ENCOUNTER — Encounter: Attending: Registered Nurse | Admitting: Registered Nurse

## 2024-09-11 VITALS — BP 134/66 | HR 70 | Ht 60.0 in | Wt 205.0 lb

## 2024-09-11 DIAGNOSIS — Z79891 Long term (current) use of opiate analgesic: Secondary | ICD-10-CM | POA: Diagnosis not present

## 2024-09-11 DIAGNOSIS — G8929 Other chronic pain: Secondary | ICD-10-CM | POA: Insufficient documentation

## 2024-09-11 DIAGNOSIS — M1711 Unilateral primary osteoarthritis, right knee: Secondary | ICD-10-CM | POA: Diagnosis not present

## 2024-09-11 DIAGNOSIS — M1712 Unilateral primary osteoarthritis, left knee: Secondary | ICD-10-CM | POA: Diagnosis not present

## 2024-09-11 DIAGNOSIS — G894 Chronic pain syndrome: Secondary | ICD-10-CM | POA: Insufficient documentation

## 2024-09-11 DIAGNOSIS — Z5181 Encounter for therapeutic drug level monitoring: Secondary | ICD-10-CM | POA: Insufficient documentation

## 2024-09-11 DIAGNOSIS — M961 Postlaminectomy syndrome, not elsewhere classified: Secondary | ICD-10-CM | POA: Diagnosis not present

## 2024-09-11 DIAGNOSIS — M545 Low back pain, unspecified: Secondary | ICD-10-CM | POA: Diagnosis not present

## 2024-09-11 DIAGNOSIS — M5416 Radiculopathy, lumbar region: Secondary | ICD-10-CM

## 2024-09-11 MED ORDER — OXYCODONE-ACETAMINOPHEN 7.5-325 MG PO TABS: 1.0000 | ORAL_TABLET | Freq: Four times a day (QID) | ORAL | 0 refills | Status: DC | PRN

## 2024-09-11 NOTE — Progress Notes (Unsigned)
 Subjective:    Patient ID: Hayley Padilla, female    DOB: 1951-11-04, 73 y.o.   MRN: [id number]  HPI: Hayley Padilla is a 73 y.o. female who returns for follow up appointment for chronic pain and medication refill. She states her pain is located in her lower back and bilateral knee pain. She rates her pain 9. Her current exercise regime is walking and performing stretching exercises.  [name] Morphine  equivalent is 45.00 MME.   Last Oral Swab was Performed on 08/10/2024, it was consistent.    Pain Inventory Average Pain 9 Pain Right Now 9 My pain is dull  In the last 24 hours, has pain interfered with the following? General activity 10 Relation with others N/A Enjoyment of life 10 What TIME of day is your pain at its worst? daytime Sleep (in general) Fair  Pain is worse with: walking and standing Pain improves with: medication Relief from Meds: 5  Family History  Problem Relation Age of Onset   Colon cancer Neg Hx    Colon polyps Neg Hx    Esophageal cancer Neg Hx    Rectal cancer Neg Hx    Stomach cancer Neg Hx    Social History   Socioeconomic History   Marital status: Widowed    Spouse name: Not on file   Number of children: Not on file   Years of education: Not on file   Highest education level: Not on file  Occupational History   Not on file  Tobacco Use   Smoking status: Never   Smokeless tobacco: Current    Types: Chew   Tobacco comments:    last used 03/17/2020 @ 2200  Vaping Use   Vaping status: Never Used  Substance and Sexual Activity   Alcohol use: Not Currently   Drug use: Never   Sexual activity: Not Currently  Other Topics Concern   Not on file  Social History Narrative   Not on file   Social Drivers of Health   Financial Resource Strain: Low Risk  (03/27/2024)   Overall Financial Resource Strain (CARDIA)    Difficulty of Paying Living Expenses: Not hard at all  Food Insecurity: No Food Insecurity (03/27/2024)   Hunger Vital Sign     Worried About Running Out of Food in the Last Year: Never true    Ran Out of Food in the Last Year: Never true  Transportation Needs: No Transportation Needs (03/27/2024)   PRAPARE - Administrator, Civil Service (Medical): No    Lack of Transportation (Non-Medical): No  Physical Activity: Inactive (03/27/2024)   Exercise Vital Sign    Days of Exercise per Week: 0 days    Minutes of Exercise per Session: 0 min  Stress: No Stress Concern Present (03/27/2024)   [employer] of Occupational Health - Occupational Stress Questionnaire    Feeling of Stress : Only a little  Social Connections: Moderately Isolated (03/27/2024)   Social Connection and Isolation Panel    Frequency of Communication with Friends and Family: More than three times a week    Frequency of Social Gatherings with Friends and Family: Twice a week    Attends Religious Services: More than 4 times per year    Active Member of [employer] or Organizations: No    Attends Banker Meetings: Never    Marital Status: Widowed   Past Surgical History:  Procedure Laterality Date   BACK SURGERY     lower back  IR LUMBAR DISC ASPIRATION W/IMG GUIDE  01/15/2022   LAMINECTOMY WITH POSTERIOR LATERAL ARTHRODESIS LEVEL 1 N/A 03/16/2022   Procedure: Lumbar One-Two Posterior Lateral Fusion with pedicle screws;  Surgeon: Louis Shove, MD;  Location: [facility] OR;  Service: Neurosurgery;  Laterality: N/A;   MULTIPLE TOOTH EXTRACTIONS     Past Surgical History:  Procedure Laterality Date   BACK SURGERY     lower back   IR LUMBAR DISC ASPIRATION W/IMG GUIDE  01/15/2022   LAMINECTOMY WITH POSTERIOR LATERAL ARTHRODESIS LEVEL 1 N/A 03/16/2022   Procedure: Lumbar One-Two Posterior Lateral Fusion with pedicle screws;  Surgeon: Louis Shove, MD;  Location: [facility] OR;  Service: Neurosurgery;  Laterality: N/A;   MULTIPLE TOOTH EXTRACTIONS     Past Medical History:  Diagnosis Date   Allergy    Arthritis    Diabetes mellitus without  complication (HCC)    Hyperlipidemia    Hypertension    PONV (postoperative nausea and vomiting)    BP 134/66 (BP Location: Left Arm, Patient Position: Sitting, Cuff Size: Large)   Pulse 70   Ht 5' (1.524 m)   Wt 205 lb (93 kg)   SpO2 96%   BMI 40.04 kg/m   Opioid Risk Score:   Fall Risk Score:  `1  Depression screen [facility] 2/9     09/11/2024    2:48 PM 08/04/2024    9:36 AM 07/07/2024    3:09 PM 06/06/2024    3:14 PM 03/27/2024    1:24 PM 02/17/2024    2:41 PM 01/17/2024    1:13 PM  Depression screen PHQ 2/9  Decreased Interest 0 1 0 0 0 0 0  Down, Depressed, Hopeless 0 1 0 2 0 0 0  PHQ - 2 Score 0 2 0 2 0 0 0  Altered sleeping    0     Tired, decreased energy    0     Change in appetite    1     Feeling bad or failure about yourself     0     Trouble concentrating    0     Moving slowly or fidgety/restless    1     Suicidal thoughts    0     PHQ-9 Score    4     Difficult doing work/chores  Somewhat difficult  Somewhat difficult         Review of Systems  Musculoskeletal:  Positive for arthralgias, back pain and myalgias.       Low back pain, left knee, lower leg and foot pain  All other systems reviewed and are negative.      Objective:   Physical Exam Vitals and nursing note reviewed.  Constitutional:      Appearance: Normal appearance. She is obese.  Cardiovascular:     Rate and Rhythm: Normal rate and regular rhythm.     Pulses: Normal pulses.     Heart sounds: Normal heart sounds.  Pulmonary:     Effort: Pulmonary effort is normal.     Breath sounds: Normal breath sounds.  Musculoskeletal:     Comments: Normal Muscle Bulk and Muscle Testing Reveals:  Upper Extremities: Right: Full ROM and Muscle Strength 5/5 Left upper extremity: Decreased ROM 90 Degrees and Muscle Strength 5/5 Lumbar Paraspinal Tenderness: L-4-L-5 Lower Extremities:Right: Full ROM and Muscle Strength 5/5 Left Lower extremity: Decreased ROM and Muscle Strength 5/5 Left Lower extremity  Flexion Produces Pain into her left Patella Arises from Table slowly  using walker for support Antalgic   Gait     Skin:    General: Skin is warm and dry.  Neurological:     Mental Status: She is alert and oriented to person, place, and time.  Psychiatric:        Mood and Affect: Mood normal.        Behavior: Behavior normal.           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 Dyckman underwent on 03/17/2023 : Dr Louis : Lumbar One-Two Posterior Lateral Fusion with pedicle screws Continue HEP as Tolerated. Continue to Monitor. 09/11/2024 2. Lumbar Radiculitis: Continue Gabapentin . Continue to monitor. 09/11/2024 3. Chronic Bilateral Hip Pain: No complaints today. Continue HEP as Tolerated. Continue to Monitor. 09/11/2024 4. Chronic Pain of Bilateral Knees: R>LContinue HEP as Tolerated. Continue to Monitor. 09/11/2024 5. Chronic Pain Syndrome: Refilled: Oxycodone  7.5/325 mg one tablet 4 times a day as needed for pain #120.  We will continue the opioid monitoring program, this consists of regular clinic visits, examinations, urine drug screen, pill counts as well as use of [place]  Controlled Substance Reporting system. A 12 month History has been reviewed on the [place]  Controlled Substance Reporting System on 09/11/2024. 6, Left Shoulder pain: Continue HEP as Tolerated. Continue to monitor.  09/11/2024 7. Bilateral Ankle Pain: No complaints today. Continue HEP as Tolerated. Continue to Monitor. 09/11/2024 F/U in 1 month

## 2024-09-13 ENCOUNTER — Other Ambulatory Visit: Payer: Self-pay | Admitting: Family

## 2024-09-13 DIAGNOSIS — Z1231 Encounter for screening mammogram for malignant neoplasm of breast: Secondary | ICD-10-CM

## 2024-09-14 ENCOUNTER — Ambulatory Visit: Admitting: Podiatry

## 2024-09-14 VITALS — Ht 60.0 in | Wt 205.0 lb

## 2024-09-14 DIAGNOSIS — M25572 Pain in left ankle and joints of left foot: Secondary | ICD-10-CM | POA: Diagnosis not present

## 2024-09-14 DIAGNOSIS — Z981 Arthrodesis status: Secondary | ICD-10-CM

## 2024-09-14 DIAGNOSIS — M19071 Primary osteoarthritis, right ankle and foot: Secondary | ICD-10-CM

## 2024-09-14 DIAGNOSIS — M25571 Pain in right ankle and joints of right foot: Secondary | ICD-10-CM | POA: Diagnosis not present

## 2024-09-14 DIAGNOSIS — M898X6 Other specified disorders of bone, lower leg: Secondary | ICD-10-CM | POA: Diagnosis not present

## 2024-09-14 DIAGNOSIS — M19072 Primary osteoarthritis, left ankle and foot: Secondary | ICD-10-CM | POA: Diagnosis not present

## 2024-09-17 NOTE — Progress Notes (Signed)
  Subjective:  Patient ID: Hayley Padilla, female    DOB: 01-03-1951,  MRN: [id number]  Chief Complaint  Patient presents with   Foot Pain    RM 22 Pt is here to discuss MRI results.    73 y.o. female presents with the above complaint. History confirmed with patient.   She states the worst pain for her still is the pain on the side of her right leg worse at night when she is trying to sleep  Objective:  Physical Exam: warm, good capillary refill, no trophic changes or ulcerative lesions, normal DP and PT pulses, normal sensory exam, and today she has very little pain in the anterior joint line of the ankle mild tenderness in the right sinus tarsi, no pain in the midfoot, good range of motion of ankle joint and subtalar joint without pain on range of motion   Radiographs: Multiple views x-ray of both ankles mild to moderate degenerative changes in the ankle joint  IMPRESSION: LEFT ANKLE:   1. Moderate tibiotalar osteoarthritis with high-grade cartilage loss at the medial talar shoulder and severe degenerative changes within the medial gutter. 2. Mild degenerative changes of the subtalar joints. 3. Loss of the anatomic fat signal in the sinus tarsi, which can be seen in the setting of sinus tarsi syndrome. 4. Generalized subcutaneous edema.     Electronically Signed   By: Mabel Converse D.O.   On: 08/29/2024 10:09   IMPRESSION: RIGHT ANKLE:   1. Moderate tibiotalar and mild subtalar osteoarthritis. 2. Tendinosis of the inframalleolar peroneus longus and peroneus brevis tendons. 3. Loss of the anatomic fat within the sinus tarsi, which can be seen in the setting of sinus tarsi syndrome. 4. Complete fatty atrophy of the imaged lower leg and foot musculature, likely on the basis of chronic denervation.     Electronically Signed   By: Mabel Converse D.O.   On: 08/29/2024 10:06 Assessment:   1. S/P lumbar spinal fusion   2. Bone pain of lower leg   3. Sinus tarsi  syndrome of right ankle   4. Sinus tarsi syndrome of left ankle   5. Arthritis of both ankles       Plan:  Patient was evaluated and treated and all questions answered.  So far no much improvement, we reviewed her MRI results which other than arthritic changes did not show much new information.  She complains primarily of the issue still bothering her on her right lower leg just below her right knee.  Discussed with her this is nothing I think is being referred out from the ankle itself and there is not much else that I would recommend from a foot and ankle standpoint at this time point.  I discussed with her possibility of lumbar issues contributing to pain such as this which she has a history of lumbar fusion.  I encouraged her to follow-up with her back specialist as well as an orthopedic specialist for her knee to evaluate this.  Referral placed as her current insurance is no longer network with her previous neurosurgeon.  Follow-up with us  as needed  No follow-ups on file.

## 2024-09-21 ENCOUNTER — Other Ambulatory Visit: Payer: Self-pay | Admitting: Family

## 2024-09-21 DIAGNOSIS — G8929 Other chronic pain: Secondary | ICD-10-CM

## 2024-09-21 DIAGNOSIS — M792 Neuralgia and neuritis, unspecified: Secondary | ICD-10-CM

## 2024-09-21 DIAGNOSIS — E785 Hyperlipidemia, unspecified: Secondary | ICD-10-CM

## 2024-09-21 DIAGNOSIS — I10 Essential (primary) hypertension: Secondary | ICD-10-CM

## 2024-09-21 DIAGNOSIS — M25471 Effusion, right ankle: Secondary | ICD-10-CM

## 2024-09-21 DIAGNOSIS — E876 Hypokalemia: Secondary | ICD-10-CM

## 2024-09-21 DIAGNOSIS — D649 Anemia, unspecified: Secondary | ICD-10-CM

## 2024-09-21 DIAGNOSIS — E559 Vitamin D deficiency, unspecified: Secondary | ICD-10-CM

## 2024-09-21 NOTE — Telephone Encounter (Signed)
 Copied from CRM [id number]. Topic: Clinical - Medication Refill >> Sep 21, 2024 10:10 AM Larissa S wrote: Medication: amLODipine  (NORVASC ) 10 MG tablet furosemide  (LASIX ) 20 MG tablet gabapentin  (NEURONTIN ) 300 MG capsule diclofenac  (VOLTAREN ) 75 MG EC tablet potassium chloride  (KLOR-CON ) 10 MEQ tablet lovastatin  (MEVACOR ) 40 MG tablet losartan  (COZAAR ) 100 MG tablet Iron , Ferrous Sulfate , 325 (65 Fe) MG TABS cloNIDine  (CATAPRES ) 0.1 MG tablet Vitamin D , Ergocalciferol , (DRISDOL ) 1.25 MG (50000 UNIT) CAPS capsule  Has the patient contacted their pharmacy? Yes (Agent: If no, request that the patient contact the pharmacy for the refill. If patient does not wish to contact the pharmacy document the reason why and proceed with request.) (Agent: If yes, when and what did the pharmacy advise?)  This is the patient's preferred pharmacy:  [facility]  Is this the correct pharmacy for this prescription? Yes If no, delete pharmacy and type the correct one.   Has the prescription been filled recently? Yes  Is the patient out of the medication? No  Has the patient been seen for an appointment in the last year OR does the patient have an upcoming appointment? Yes  Can we respond through MyChart? No  Agent: Please be advised that Rx refills may take up to 3 business days. We ask that you follow-up with your pharmacy.

## 2024-09-22 ENCOUNTER — Other Ambulatory Visit: Payer: Self-pay | Admitting: *Deleted

## 2024-09-22 DIAGNOSIS — I10 Essential (primary) hypertension: Secondary | ICD-10-CM

## 2024-09-22 MED ORDER — LOSARTAN POTASSIUM 100 MG PO TABS: 100.0000 mg | ORAL_TABLET | Freq: Every day | ORAL | 0 refills | Status: DC

## 2024-09-22 MED ORDER — LOVASTATIN 40 MG PO TABS: 40.0000 mg | ORAL_TABLET | Freq: Every day | ORAL | 1 refills | Status: DC

## 2024-09-22 NOTE — Telephone Encounter (Signed)
 Requested medications are due for refill today.  yes  Requested medications are on the active medications list.  yes  Last refill. Varied  Future visit scheduled.   yes  Notes to clinic.  Several of the medications have rxs that are about to expire on 10/05/2024, they are:Amlodipine , Gabapentin , Voltaren , clonidine , and potassium chloride .  Some medications have expired labs, they are: Lasix  & Iron .  One medication needs to be reviewed by provider for refill, vitamin d .      Requested Prescriptions  Pending Prescriptions Disp Refills   amLODipine  (NORVASC ) 10 MG tablet 90 tablet 0    Sig: Take 1 tablet (10 mg total) by mouth daily.     Cardiovascular: Calcium  Channel Blockers 2 Passed - 09/22/2024  5:22 PM      Passed - Last BP in normal range    BP Readings from Last 1 Encounters:  09/11/24 134/66         Passed - Last Heart Rate in normal range    Pulse Readings from Last 1 Encounters:  09/11/24 70         Passed - Valid encounter within last 6 months    Recent Outpatient Visits           1 month ago Ankle edema, bilateral   [place] [facility], Amy J, NP   2 months ago Primary hypertension   [place] [facility], Amy J, NP   3 months ago Sinusitis, unspecified chronicity, unspecified location   [facility], Amy J, NP   4 months ago Primary hypertension   [place] [facility], Amy J, NP   5 months ago Annual physical exam   [place] [facility], Amy J, NP               furosemide  (LASIX ) 20 MG tablet 90 tablet 0    Sig: Take 1 tablet (20 mg total) by mouth daily as needed.     Cardiovascular:  Diuretics - Loop Failed - 09/22/2024  5:22 PM      Failed - Cl in normal range and within 180 days    Chloride  Date Value Ref Range Status  03/27/2024 107 (H) 96 - 106 mmol/L Final         Failed -  Mg Level in normal range and within 180 days    Magnesium   Date Value Ref Range Status  03/12/2022 2.0 1.7 - 2.4 mg/dL Final    Comment:    Performed at [facility], [address] [place], [place] [address]         Passed - K in normal range and within 180 days    Potassium  Date Value Ref Range Status  03/27/2024 3.9 3.5 - 5.2 mmol/L Final         Passed - Ca in normal range and within 180 days    Calcium   Date Value Ref Range Status  03/27/2024 8.9 8.7 - 10.3 mg/dL Final         Passed - Na in normal range and within 180 days    Sodium  Date Value Ref Range Status  03/27/2024 142 134 - 144 mmol/L Final         Passed - Cr in normal range and within 180 days    Creat  Date Value Ref Range Status  03/10/2022  0.63 0.60 - 1.00 mg/dL Final   Creatinine, Ser  Date Value Ref Range Status  03/27/2024 0.58 0.57 - 1.00 mg/dL Final         Passed - Last BP in normal range    BP Readings from Last 1 Encounters:  09/11/24 134/66         Passed - Valid encounter within last 6 months    Recent Outpatient Visits           1 month ago Ankle edema, bilateral   [place] [facility] [place], [place]   2 months ago Primary hypertension   [place] [facility], Amy J, NP   3 months ago Sinusitis, unspecified chronicity, unspecified location   [facility], Amy J, NP   4 months ago Primary hypertension   [place] [facility], Amy J, NP   5 months ago Annual physical exam   [place] [facility], Amy J, NP               gabapentin  (NEURONTIN ) 300 MG capsule 120 capsule 2    Sig: Take 1 capsule (300 mg total) by mouth 4 (four) times daily.     Neurology: Anticonvulsants - gabapentin  Passed - 09/22/2024  5:22 PM      Passed - Cr in normal range and within 360 days    Creat  Date Value Ref Range Status   03/10/2022 0.63 0.60 - 1.00 mg/dL Final   Creatinine, Ser  Date Value Ref Range Status  03/27/2024 0.58 0.57 - 1.00 mg/dL Final         Passed - Completed PHQ-2 or PHQ-9 in the last 360 days      Passed - Valid encounter within last 12 months    Recent Outpatient Visits           1 month ago Ankle edema, bilateral   [place] [facility], Amy J, NP   2 months ago Primary hypertension   [place] [facility], Amy J, NP   3 months ago Sinusitis, unspecified chronicity, unspecified location   [facility], Amy J, NP   4 months ago Primary hypertension   [place] [facility], Amy J, NP   5 months ago Annual physical exam   [place] [facility], Amy J, NP               diclofenac  (VOLTAREN ) 75 MG EC tablet 180 tablet 0    Sig: Take 1 tablet (75 mg total) by mouth 2 (two) times daily.     Analgesics:  NSAIDS Failed - 09/22/2024  5:22 PM      Failed - Manual Review: Labs are only required if the patient has taken medication for more than 8 weeks.      Failed - HGB in normal range and within 360 days    Hemoglobin  Date Value Ref Range Status  03/27/2024 10.3 (L) 11.1 - 15.9 g/dL Final         Passed - Cr in normal range and within 360 days    Creat  Date Value Ref Range Status  03/10/2022 0.63 0.60 - 1.00 mg/dL Final   Creatinine, Ser  Date Value Ref Range Status  03/27/2024 0.58  0.57 - 1.00 mg/dL Final         Passed - PLT in normal range and within 360 days    Platelets  Date Value Ref Range Status  03/27/2024 319 150 - 450 x10E3/uL Final         Passed - HCT in normal range and within 360 days    Hematocrit  Date Value Ref Range Status  03/27/2024 34.0 34.0 - 46.6 % Final         Passed - eGFR is 30 or above and within 360 days    GFR, Est African American  Date Value Ref Range Status   01/21/2021 102 > OR = 60 mL/min/1.31m2 Final   GFR, Est Non African American  Date Value Ref Range Status  01/21/2021 88 > OR = 60 mL/min/1.66m2 Final   GFR, Estimated  Date Value Ref Range Status  03/16/2022 >60 >60 mL/min Final    Comment:    (NOTE) Calculated using the CKD-EPI Creatinine Equation (2021)    eGFR  Date Value Ref Range Status  03/27/2024 95 >59 mL/min/1.73 Final         Passed - Patient is not pregnant      Passed - Valid encounter within last 12 months    Recent Outpatient Visits           1 month ago Ankle edema, bilateral   [place] [facility], Amy J, NP   2 months ago Primary hypertension   [place] [facility], Amy J, NP   3 months ago Sinusitis, unspecified chronicity, unspecified location   [facility], Amy J, NP   4 months ago Primary hypertension   [place] [facility] at [facility], Amy J, NP   5 months ago Annual physical exam   [place] [facility] at [facility], Amy J, NP               Vitamin D , Ergocalciferol , (DRISDOL ) 1.25 MG (50000 UNIT) CAPS capsule 12 capsule 0     Endocrinology:  Vitamins - Vitamin D  Supplementation 2 Failed - 09/22/2024  5:22 PM      Failed - Manual Review: Route requests for 50,000 IU strength to the provider      Passed - Ca in normal range and within 360 days    Calcium   Date Value Ref Range Status  03/27/2024 8.9 8.7 - 10.3 mg/dL Final         Passed - Vitamin D  in normal range and within 360 days    Vit D, 25-Hydroxy  Date Value Ref Range Status  03/27/2024 60.6 30.0 - 100.0 ng/mL Final    Comment:    Vitamin D  deficiency has been defined by the Institute of Medicine and an Endocrine Society practice guideline as a level of serum 25-OH vitamin D  less than 20 ng/mL (1,2). The Endocrine Society went on to further define vitamin D  insufficiency as a level  between 21 and 29 ng/mL (2). 1. IOM (Institute of Medicine). 2010. Dietary reference    intakes for calcium  and D. [place]: The    [employer]. 2. Holick MF, [place], Bischoff-Ferrari HA, et al.    Evaluation, treatment, and prevention of vitamin D     deficiency: an Endocrine Society clinical practice    guideline. JCEM. 2011 Jul; 96(7):1911-30.          Passed - Valid  encounter within last 12 months    Recent Outpatient Visits           1 month ago Ankle edema, bilateral   [place] [facility], [place] J, NP   2 months ago Primary hypertension   [place] [facility], Amy J, NP   3 months ago Sinusitis, unspecified chronicity, unspecified location   [facility], Amy J, NP   4 months ago Primary hypertension   [place] [facility], Amy J, NP   5 months ago Annual physical exam   [place] [facility], Amy J, NP               cloNIDine  (CATAPRES ) 0.1 MG tablet 180 tablet 0    Sig: Take 1 tablet (0.1 mg total) by mouth 2 (two) times daily.     Cardiovascular:  Alpha-2 Agonists Passed - 09/22/2024  5:22 PM      Passed - Last BP in normal range    BP Readings from Last 1 Encounters:  09/11/24 134/66         Passed - Last Heart Rate in normal range    Pulse Readings from Last 1 Encounters:  09/11/24 70         Passed - Valid encounter within last 6 months    Recent Outpatient Visits           1 month ago Ankle edema, bilateral   [place] [facility], Amy J, NP   2 months ago Primary hypertension   [place] [facility], Amy J, NP   3 months ago Sinusitis, unspecified chronicity, unspecified location   [facility], Amy J, NP   4 months ago Primary hypertension   [place] [facility] at  [facility], Amy J, NP   5 months ago Annual physical exam   [place] [facility], Amy J, NP               Iron , Ferrous Sulfate , 325 (65 Fe) MG TABS 90 tablet 0    Sig: Take 325 mg by mouth daily.     Endocrinology:  Minerals - Iron  Supplementation Failed - 09/22/2024  5:22 PM      Failed - HGB in normal range and within 360 days    Hemoglobin  Date Value Ref Range Status  03/27/2024 10.3 (L) 11.1 - 15.9 g/dL Final         Failed - Fe (serum) in normal range and within 360 days    Iron   Date Value Ref Range Status  01/15/2022 47 28 - 170 ug/dL Final   Saturation Ratios  Date Value Ref Range Status  01/15/2022 14 10.4 - 31.8 % Final         Failed - Ferritin in normal range and within 360 days    Ferritin  Date Value Ref Range Status  01/15/2022 19 11 - 307 ng/mL Final    Comment:    Performed at [facility], [address] [place], [place] [address]         Passed - HCT in normal range and within 360 days    Hematocrit  Date Value Ref Range Status  03/27/2024 34.0 34.0 - 46.6 % Final  Passed - RBC in normal range and within 360 days    RBC  Date Value Ref Range Status  03/27/2024 4.36 3.77 - 5.28 x10E6/uL Final  03/16/2022 4.45 3.87 - 5.11 MIL/uL Final         Passed - Valid encounter within last 12 months    Recent Outpatient Visits           1 month ago Ankle edema, bilateral   [place] [facility], Amy J, NP   2 months ago Primary hypertension   [place] [facility] at [facility], Amy J, NP   3 months ago Sinusitis, unspecified chronicity, unspecified location   [facility], Amy J, NP   4 months ago Primary hypertension   [place] [facility], Amy J, NP   5 months ago Annual physical exam   [place] [facility], Amy J, NP                potassium chloride  (KLOR-CON ) 10 MEQ tablet 90 tablet 0    Sig: Take 1 tablet (10 mEq total) by mouth daily.     Endocrinology:  Minerals - Potassium Supplementation Passed - 09/22/2024  5:22 PM      Passed - K in normal range and within 360 days    Potassium  Date Value Ref Range Status  03/27/2024 3.9 3.5 - 5.2 mmol/L Final         Passed - Cr in normal range and within 360 days    Creat  Date Value Ref Range Status  03/10/2022 0.63 0.60 - 1.00 mg/dL Final   Creatinine, Ser  Date Value Ref Range Status  03/27/2024 0.58 0.57 - 1.00 mg/dL Final         Passed - Valid encounter within last 12 months    Recent Outpatient Visits           1 month ago Ankle edema, bilateral   [place] [facility], [place], NP   2 months ago Primary hypertension   [place] [facility] at [facility], Amy J, NP   3 months ago Sinusitis, unspecified chronicity, unspecified location   [facility], Amy J, NP   4 months ago Primary hypertension    Primary [facility] at [facility], Amy J, NP   5 months ago Annual physical exam    [facility] at [facility], Amy J, NP              Signed Prescriptions Disp Refills   lovastatin  (MEVACOR ) 40 MG tablet 90 tablet 1    Sig: Take 1 tablet (40 mg total) by mouth at bedtime.     Cardiovascular:  Antilipid - Statins 2 Failed - 09/22/2024  5:22 PM      Failed - Lipid Panel in normal range within the last 12 months    Cholesterol, Total  Date Value Ref Range Status  03/27/2024 137 100 - 199 mg/dL Final   LDL Cholesterol (Calc)  Date Value Ref Range Status  03/10/2022 91 mg/dL (calc) Final    Comment:    Reference range: <100 . Desirable range <100 mg/dL for primary prevention;   <70 mg/dL for patients with CHD or diabetic patients  with > or = 2 CHD risk factors. SABRA LDL-C is now calculated using  the Martin-Hopkins   calculation, which is a validated novel method providing  better accuracy than the Friedewald equation in the  estimation of LDL-C.  Gladis APPLETHWAITE et al. SANDREA. 7986;689(80): 2061-2068  (http://education.[employer].com/faq/FAQ164)    LDL Chol Calc (NIH)  Date Value Ref Range Status  03/27/2024 68 0 - 99 mg/dL Final   HDL  Date Value Ref Range Status  03/27/2024 46 >39 mg/dL Final   Triglycerides  Date Value Ref Range Status  03/27/2024 127 0 - 149 mg/dL Final         Passed - Cr in normal range and within 360 days    Creat  Date Value Ref Range Status  03/10/2022 0.63 0.60 - 1.00 mg/dL Final   Creatinine, Ser  Date Value Ref Range Status  03/27/2024 0.58 0.57 - 1.00 mg/dL Final         Passed - Patient is not pregnant      Passed - Valid encounter within last 12 months    Recent Outpatient Visits           1 month ago Ankle edema, bilateral   [place] [facility], [place], NP   2 months ago Primary hypertension   [place] [facility], Amy J, NP   3 months ago Sinusitis, unspecified chronicity, unspecified location   [facility], Amy J, NP   4 months ago Primary hypertension   [place] [facility], Amy J, NP   5 months ago Annual physical exam   [place] [facility], Amy J, NP              Refused Prescriptions Disp Refills   losartan  (COZAAR ) 100 MG tablet 90 tablet 0    Sig: Take 1 tablet (100 mg total) by mouth daily.     Cardiovascular:  Angiotensin Receptor Blockers Passed - 09/22/2024  5:22 PM      Passed - Cr in normal range and within 180 days    Creat  Date Value Ref Range Status  03/10/2022 0.63 0.60 - 1.00 mg/dL Final   Creatinine, Ser  Date Value Ref Range Status  03/27/2024 0.58 0.57 - 1.00 mg/dL Final         Passed - K in normal range and within 180 days    Potassium   Date Value Ref Range Status  03/27/2024 3.9 3.5 - 5.2 mmol/L Final         Passed - Patient is not pregnant      Passed - Last BP in normal range    BP Readings from Last 1 Encounters:  09/11/24 134/66         Passed - Valid encounter within last 6 months    Recent Outpatient Visits           1 month ago Ankle edema, bilateral   [place] [facility], [place], NP   2 months ago Primary hypertension   [place] [facility], Amy J, NP   3 months ago Sinusitis, unspecified chronicity, unspecified location   [facility], Amy J, NP   4 months ago Primary hypertension   [place] [facility], Amy J, NP   5 months ago Annual physical exam   [place] [facility] PARAS, NP

## 2024-09-22 NOTE — Telephone Encounter (Signed)
 Requested Prescriptions  Pending Prescriptions Disp Refills   amLODipine  (NORVASC ) 10 MG tablet 90 tablet 0    Sig: Take 1 tablet (10 mg total) by mouth daily.     Cardiovascular: Calcium  Channel Blockers 2 Passed - 09/22/2024  5:21 PM      Passed - Last BP in normal range    BP Readings from Last 1 Encounters:  09/11/24 134/66         Passed - Last Heart Rate in normal range    Pulse Readings from Last 1 Encounters:  09/11/24 70         Passed - Valid encounter within last 6 months    Recent Outpatient Visits           1 month ago Ankle edema, bilateral   [place] [facility], Amy J, NP   2 months ago Primary hypertension   [place] [facility] at [facility], Amy J, NP   3 months ago Sinusitis, unspecified chronicity, unspecified location   [facility], Amy J, NP   4 months ago Primary hypertension   [place] [facility] at [facility], Amy J, NP   5 months ago Annual physical exam   [place] [facility], Amy J, NP               furosemide  (LASIX ) 20 MG tablet 90 tablet 0    Sig: Take 1 tablet (20 mg total) by mouth daily as needed.     Cardiovascular:  Diuretics - Loop Failed - 09/22/2024  5:21 PM      Failed - Cl in normal range and within 180 days    Chloride  Date Value Ref Range Status  03/27/2024 107 (H) 96 - 106 mmol/L Final         Failed - Mg Level in normal range and within 180 days    Magnesium   Date Value Ref Range Status  03/12/2022 2.0 1.7 - 2.4 mg/dL Final    Comment:    Performed at [facility], [address], [place], [place] [address]         Passed - K in normal range and within 180 days    Potassium  Date Value Ref Range Status  03/27/2024 3.9 3.5 - 5.2 mmol/L Final         Passed - Ca in normal range and within 180 days    Calcium   Date Value Ref Range Status  03/27/2024 8.9 8.7 - 10.3  mg/dL Final         Passed - Na in normal range and within 180 days    Sodium  Date Value Ref Range Status  03/27/2024 142 134 - 144 mmol/L Final         Passed - Cr in normal range and within 180 days    Creat  Date Value Ref Range Status  03/10/2022 0.63 0.60 - 1.00 mg/dL Final   Creatinine, Ser  Date Value Ref Range Status  03/27/2024 0.58 0.57 - 1.00 mg/dL Final         Passed - Last BP in normal range    BP Readings from Last 1 Encounters:  09/11/24 134/66         Passed - Valid encounter within last 6 months    Recent Outpatient Visits           1 month ago  Ankle edema, bilateral   [place] [facility], [place] J, NP   2 months ago Primary hypertension   [place] [facility], Amy J, NP   3 months ago Sinusitis, unspecified chronicity, unspecified location   [facility] at [facility], Amy J, NP   4 months ago Primary hypertension   [place] [facility] at [facility], Amy J, NP   5 months ago Annual physical exam   [place] [facility], Amy J, NP               gabapentin  (NEURONTIN ) 300 MG capsule 120 capsule 2    Sig: Take 1 capsule (300 mg total) by mouth 4 (four) times daily.     Neurology: Anticonvulsants - gabapentin  Passed - 09/22/2024  5:21 PM      Passed - Cr in normal range and within 360 days    Creat  Date Value Ref Range Status  03/10/2022 0.63 0.60 - 1.00 mg/dL Final   Creatinine, Ser  Date Value Ref Range Status  03/27/2024 0.58 0.57 - 1.00 mg/dL Final         Passed - Completed PHQ-2 or PHQ-9 in the last 360 days      Passed - Valid encounter within last 12 months    Recent Outpatient Visits           1 month ago Ankle edema, bilateral   [place] [facility], Amy J, NP   2 months ago Primary hypertension    [place] [facility], Amy J, NP    3 months ago Sinusitis, unspecified chronicity, unspecified location   [facility], Amy J, NP   4 months ago Primary hypertension   [place] [facility] at [facility], Amy J, NP   5 months ago Annual physical exam    [facility], Amy J, NP               diclofenac  (VOLTAREN ) 75 MG EC tablet 180 tablet 0    Sig: Take 1 tablet (75 mg total) by mouth 2 (two) times daily.     Analgesics:  NSAIDS Failed - 09/22/2024  5:21 PM      Failed - Manual Review: Labs are only required if the patient has taken medication for more than 8 weeks.      Failed - HGB in normal range and within 360 days    Hemoglobin  Date Value Ref Range Status  03/27/2024 10.3 (L) 11.1 - 15.9 g/dL Final         Passed - Cr in normal range and within 360 days    Creat  Date Value Ref Range Status  03/10/2022 0.63 0.60 - 1.00 mg/dL Final   Creatinine, Ser  Date Value Ref Range Status  03/27/2024 0.58 0.57 - 1.00 mg/dL Final         Passed - PLT in normal range and within 360 days    Platelets  Date Value Ref Range Status  03/27/2024 319 150 - 450 x10E3/uL Final         Passed - HCT in normal range and within 360 days    Hematocrit  Date Value Ref Range Status  03/27/2024 34.0 34.0 - 46.6 % Final         Passed -  eGFR is 30 or above and within 360 days    GFR, Est African American  Date Value Ref Range Status  01/21/2021 102 > OR = 60 mL/min/1.31m2 Final   GFR, Est Non African American  Date Value Ref Range Status  01/21/2021 88 > OR = 60 mL/min/1.17m2 Final   GFR, Estimated  Date Value Ref Range Status  03/16/2022 >60 >60 mL/min Final    Comment:    (NOTE) Calculated using the CKD-EPI Creatinine Equation (2021)    eGFR  Date Value Ref Range Status  03/27/2024 95 >59 mL/min/1.73 Final         Passed - Patient is not pregnant      Passed - Valid encounter within last 12 months    Recent  Outpatient Visits           1 month ago Ankle edema, bilateral   [place] [facility], Amy J, NP   2 months ago Primary hypertension   [place] [facility], Amy J, NP   3 months ago Sinusitis, unspecified chronicity, unspecified location   [facility], Amy J, NP   4 months ago Primary hypertension   [place] [facility] at [facility], Amy J, NP   5 months ago Annual physical exam   [place] [facility] at [facility], Amy J, NP               Vitamin D , Ergocalciferol , (DRISDOL ) 1.25 MG (50000 UNIT) CAPS capsule 12 capsule 0     Endocrinology:  Vitamins - Vitamin D  Supplementation 2 Failed - 09/22/2024  5:21 PM      Failed - Manual Review: Route requests for 50,000 IU strength to the provider      Passed - Ca in normal range and within 360 days    Calcium   Date Value Ref Range Status  03/27/2024 8.9 8.7 - 10.3 mg/dL Final         Passed - Vitamin D  in normal range and within 360 days    Vit D, 25-Hydroxy  Date Value Ref Range Status  03/27/2024 60.6 30.0 - 100.0 ng/mL Final    Comment:    Vitamin D  deficiency has been defined by the Institute of Medicine and an Endocrine Society practice guideline as a level of serum 25-OH vitamin D  less than 20 ng/mL (1,2). The Endocrine Society went on to further define vitamin D  insufficiency as a level between 21 and 29 ng/mL (2). 1. IOM (Institute of Medicine). 2010. Dietary reference    intakes for calcium  and D. [place]  [place]: The    [employer]. 2. Holick MF, Binkley [place], Bischoff-Ferrari HA, et al.    Evaluation, treatment, and prevention of vitamin D     deficiency: an Endocrine Society clinical practice    guideline. JCEM. 2011 Jul; 96(7):1911-30.          Passed - Valid encounter within last 12 months    Recent Outpatient Visits           1 month ago Ankle edema, bilateral    [place] [facility] at [facility], [place], NP   2 months ago Primary hypertension   [place] [facility] at [facility], Amy J, NP   3 months ago Sinusitis, unspecified chronicity, unspecified location   [facility], Greig PARAS, NP   4 months  ago Primary hypertension   [place] [facility] at [facility], [place] J, NP   5 months ago Annual physical exam   [place] [facility] at [facility], Amy J, NP               cloNIDine  (CATAPRES ) 0.1 MG tablet 180 tablet 0    Sig: Take 1 tablet (0.1 mg total) by mouth 2 (two) times daily.     Cardiovascular:  Alpha-2 Agonists Passed - 09/22/2024  5:21 PM      Passed - Last BP in normal range    BP Readings from Last 1 Encounters:  09/11/24 134/66         Passed - Last Heart Rate in normal range    Pulse Readings from Last 1 Encounters:  09/11/24 70         Passed - Valid encounter within last 6 months    Recent Outpatient Visits           1 month ago Ankle edema, bilateral   [place] [facility], [place], NP   2 months ago Primary hypertension   [place] [facility] at [facility], Amy J, NP   3 months ago Sinusitis, unspecified chronicity, unspecified location   [facility], Amy J, NP   4 months ago Primary hypertension   [place] [facility] at [facility], Amy J, NP   5 months ago Annual physical exam   [place] [facility] at [facility], Amy J, NP               Iron , Ferrous Sulfate , 325 (65 Fe) MG TABS 90 tablet 0    Sig: Take 325 mg by mouth daily.     Endocrinology:  Minerals - Iron  Supplementation Failed - 09/22/2024  5:21 PM      Failed - HGB in normal range and within 360 days    Hemoglobin  Date Value Ref Range Status  03/27/2024 10.3 (L) 11.1 - 15.9 g/dL Final         Failed - Fe (serum)  in normal range and within 360 days    Iron   Date Value Ref Range Status  01/15/2022 47 28 - 170 ug/dL Final   Saturation Ratios  Date Value Ref Range Status  01/15/2022 14 10.4 - 31.8 % Final         Failed - Ferritin in normal range and within 360 days    Ferritin  Date Value Ref Range Status  01/15/2022 19 11 - 307 ng/mL Final    Comment:    Performed at [facility], [address], [place], [place] [address]         Passed - HCT in normal range and within 360 days    Hematocrit  Date Value Ref Range Status  03/27/2024 34.0 34.0 - 46.6 % Final         Passed - RBC in normal range and within 360 days    RBC  Date Value Ref Range Status  03/27/2024 4.36 3.77 - 5.28 x10E6/uL Final  03/16/2022 4.45 3.87 - 5.11 MIL/uL Final         Passed - Valid encounter within last 12 months    Recent Outpatient Visits           1 month ago Ankle edema, bilateral   [place] [facility] at [facility]  Lorren Greig PARAS, NP   2 months ago Primary hypertension   [place] [facility], Amy J, NP   3 months ago Sinusitis, unspecified chronicity, unspecified location   [facility] at [facility], Amy J, NP   4 months ago Primary hypertension   [place] [facility] at [facility], Amy J, NP   5 months ago Annual physical exam   [place] [facility] at [facility], Amy J, NP               potassium chloride  (KLOR-CON ) 10 MEQ tablet 90 tablet 0    Sig: Take 1 tablet (10 mEq total) by mouth daily.     Endocrinology:  Minerals - Potassium Supplementation Passed - 09/22/2024  5:21 PM      Passed - K in normal range and within 360 days    Potassium  Date Value Ref Range Status  03/27/2024 3.9 3.5 - 5.2 mmol/L Final         Passed - Cr in normal range and within 360 days    Creat  Date Value Ref Range Status  03/10/2022 0.63 0.60 - 1.00 mg/dL Final   Creatinine, Ser  Date Value  Ref Range Status  03/27/2024 0.58 0.57 - 1.00 mg/dL Final         Passed - Valid encounter within last 12 months    Recent Outpatient Visits           1 month ago Ankle edema, bilateral   [place] [facility] at [facility], [place], NP   2 months ago Primary hypertension   [place] [facility] at [facility], Amy J, NP   3 months ago Sinusitis, unspecified chronicity, unspecified location   [facility] at [facility], Amy J, NP   4 months ago Primary hypertension    [facility], Amy J, NP   5 months ago Annual physical exam    Primary [facility] at [facility], Amy J, NP              Signed Prescriptions Disp Refills   lovastatin  (MEVACOR ) 40 MG tablet 90 tablet 1    Sig: Take 1 tablet (40 mg total) by mouth at bedtime.     Cardiovascular:  Antilipid - Statins 2 Failed - 09/22/2024  5:21 PM      Failed - Lipid Panel in normal range within the last 12 months    Cholesterol, Total  Date Value Ref Range Status  03/27/2024 137 100 - 199 mg/dL Final   LDL Cholesterol (Calc)  Date Value Ref Range Status  03/10/2022 91 mg/dL (calc) Final    Comment:    Reference range: <100 . Desirable range <100 mg/dL for primary prevention;   <70 mg/dL for patients with CHD or diabetic patients  with > or = 2 CHD risk factors. SABRA LDL-C is now calculated using the Martin-Hopkins  calculation, which is a validated novel method providing  better accuracy than the Friedewald equation in the  estimation of LDL-C.  Gladis APPLETHWAITE et al. SANDREA. 7986;689(80): 2061-2068  (http://education.[employer].com/faq/FAQ164)    LDL Chol Calc (NIH)  Date Value Ref Range Status  03/27/2024 68 0 - 99 mg/dL Final   HDL  Date Value Ref Range Status  03/27/2024 46 >39 mg/dL Final   Triglycerides  Date Value Ref Range Status  03/27/2024 127 0 -  149 mg/dL Final         Passed - Cr in  normal range and within 360 days    Creat  Date Value Ref Range Status  03/10/2022 0.63 0.60 - 1.00 mg/dL Final   Creatinine, Ser  Date Value Ref Range Status  03/27/2024 0.58 0.57 - 1.00 mg/dL Final         Passed - Patient is not pregnant      Passed - Valid encounter within last 12 months    Recent Outpatient Visits           1 month ago Ankle edema, bilateral   [place] [facility] at [facility], Amy J, NP   2 months ago Primary hypertension   [place] [facility] at [facility], Amy J, NP   3 months ago Sinusitis, unspecified chronicity, unspecified location   [facility] at [facility], Amy J, NP   4 months ago Primary hypertension   [place] [facility] at [facility], Amy J, NP   5 months ago Annual physical exam   [place] [facility] at [facility], Amy J, NP              Refused Prescriptions Disp Refills   losartan  (COZAAR ) 100 MG tablet 90 tablet 0    Sig: Take 1 tablet (100 mg total) by mouth daily.     Cardiovascular:  Angiotensin Receptor Blockers Passed - 09/22/2024  5:21 PM      Passed - Cr in normal range and within 180 days    Creat  Date Value Ref Range Status  03/10/2022 0.63 0.60 - 1.00 mg/dL Final   Creatinine, Ser  Date Value Ref Range Status  03/27/2024 0.58 0.57 - 1.00 mg/dL Final         Passed - K in normal range and within 180 days    Potassium  Date Value Ref Range Status  03/27/2024 3.9 3.5 - 5.2 mmol/L Final         Passed - Patient is not pregnant      Passed - Last BP in normal range    BP Readings from Last 1 Encounters:  09/11/24 134/66         Passed - Valid encounter within last 6 months    Recent Outpatient Visits           1 month ago Ankle edema, bilateral   [place] [facility] at [facility], [place], NP   2 months ago Primary hypertension   [place] [facility], Amy J, NP    3 months ago Sinusitis, unspecified chronicity, unspecified location   [facility], Amy J, NP   4 months ago Primary hypertension   [place] [facility] at [facility], [place], NP   5 months ago Annual physical exam   [facility], Greig PARAS, NP

## 2024-09-22 NOTE — Telephone Encounter (Signed)
 Requested Prescriptions  Pending Prescriptions Disp Refills   amLODipine  (NORVASC ) 10 MG tablet 90 tablet 0    Sig: Take 1 tablet (10 mg total) by mouth daily.     Cardiovascular: Calcium  Channel Blockers 2 Passed - 09/22/2024  5:16 PM      Passed - Last BP in normal range    BP Readings from Last 1 Encounters:  09/11/24 134/66         Passed - Last Heart Rate in normal range    Pulse Readings from Last 1 Encounters:  09/11/24 70         Passed - Valid encounter within last 6 months    Recent Outpatient Visits           1 month ago Ankle edema, bilateral   [place] [facility], Amy J, NP   2 months ago Primary hypertension   [place] [facility], Amy J, NP   3 months ago Sinusitis, unspecified chronicity, unspecified location   [facility], Amy J, NP   4 months ago Primary hypertension   [place] [facility], Amy J, NP   5 months ago Annual physical exam   [place] [facility], Amy J, NP               furosemide  (LASIX ) 20 MG tablet 90 tablet 0    Sig: Take 1 tablet (20 mg total) by mouth daily as needed.     Cardiovascular:  Diuretics - Loop Failed - 09/22/2024  5:16 PM      Failed - Cl in normal range and within 180 days    Chloride  Date Value Ref Range Status  03/27/2024 107 (H) 96 - 106 mmol/L Final         Failed - Mg Level in normal range and within 180 days    Magnesium   Date Value Ref Range Status  03/12/2022 2.0 1.7 - 2.4 mg/dL Final    Comment:    Performed at [facility], [address] [place], [place] [address]         Passed - K in normal range and within 180 days    Potassium  Date Value Ref Range Status  03/27/2024 3.9 3.5 - 5.2 mmol/L Final         Passed - Ca in normal range and within 180 days    Calcium   Date Value Ref Range Status  03/27/2024 8.9 8.7 - 10.3  mg/dL Final         Passed - Na in normal range and within 180 days    Sodium  Date Value Ref Range Status  03/27/2024 142 134 - 144 mmol/L Final         Passed - Cr in normal range and within 180 days    Creat  Date Value Ref Range Status  03/10/2022 0.63 0.60 - 1.00 mg/dL Final   Creatinine, Ser  Date Value Ref Range Status  03/27/2024 0.58 0.57 - 1.00 mg/dL Final         Passed - Last BP in normal range    BP Readings from Last 1 Encounters:  09/11/24 134/66         Passed - Valid encounter within last 6 months    Recent Outpatient Visits           1 month ago  Ankle edema, bilateral   [place] [facility], [place] J, NP   2 months ago Primary hypertension   [place] [facility], Amy J, NP   3 months ago Sinusitis, unspecified chronicity, unspecified location   [facility], Amy J, NP   4 months ago Primary hypertension   [place] [facility], Amy J, NP   5 months ago Annual physical exam   [place] [facility], Amy J, NP               gabapentin  (NEURONTIN ) 300 MG capsule 120 capsule 2    Sig: Take 1 capsule (300 mg total) by mouth 4 (four) times daily.     Neurology: Anticonvulsants - gabapentin  Passed - 09/22/2024  5:16 PM      Passed - Cr in normal range and within 360 days    Creat  Date Value Ref Range Status  03/10/2022 0.63 0.60 - 1.00 mg/dL Final   Creatinine, Ser  Date Value Ref Range Status  03/27/2024 0.58 0.57 - 1.00 mg/dL Final         Passed - Completed PHQ-2 or PHQ-9 in the last 360 days      Passed - Valid encounter within last 12 months    Recent Outpatient Visits           1 month ago Ankle edema, bilateral   [place] [facility], Amy J, NP   2 months ago Primary hypertension   [place] [facility], Amy J, NP    3 months ago Sinusitis, unspecified chronicity, unspecified location   [facility], Amy J, NP   4 months ago Primary hypertension   [place] [facility], Amy J, NP   5 months ago Annual physical exam   [place] [facility], Amy J, NP               diclofenac  (VOLTAREN ) 75 MG EC tablet 180 tablet 0    Sig: Take 1 tablet (75 mg total) by mouth 2 (two) times daily.     Analgesics:  NSAIDS Failed - 09/22/2024  5:16 PM      Failed - Manual Review: Labs are only required if the patient has taken medication for more than 8 weeks.      Failed - HGB in normal range and within 360 days    Hemoglobin  Date Value Ref Range Status  03/27/2024 10.3 (L) 11.1 - 15.9 g/dL Final         Passed - Cr in normal range and within 360 days    Creat  Date Value Ref Range Status  03/10/2022 0.63 0.60 - 1.00 mg/dL Final   Creatinine, Ser  Date Value Ref Range Status  03/27/2024 0.58 0.57 - 1.00 mg/dL Final         Passed - PLT in normal range and within 360 days    Platelets  Date Value Ref Range Status  03/27/2024 319 150 - 450 x10E3/uL Final         Passed - HCT in normal range and within 360 days    Hematocrit  Date Value Ref Range Status  03/27/2024 34.0 34.0 - 46.6 % Final         Passed -  eGFR is 30 or above and within 360 days    GFR, Est African American  Date Value Ref Range Status  01/21/2021 102 > OR = 60 mL/min/1.30m2 Final   GFR, Est Non African American  Date Value Ref Range Status  01/21/2021 88 > OR = 60 mL/min/1.87m2 Final   GFR, Estimated  Date Value Ref Range Status  03/16/2022 >60 >60 mL/min Final    Comment:    (NOTE) Calculated using the CKD-EPI Creatinine Equation (2021)    eGFR  Date Value Ref Range Status  03/27/2024 95 >59 mL/min/1.73 Final         Passed - Patient is not pregnant      Passed - Valid encounter within last 12 months    Recent  Outpatient Visits           1 month ago Ankle edema, bilateral   [place] [facility], Amy J, NP   2 months ago Primary hypertension   [place] [facility], Amy J, NP   3 months ago Sinusitis, unspecified chronicity, unspecified location   [facility], Amy J, NP   4 months ago Primary hypertension   [place] [facility], Amy J, NP   5 months ago Annual physical exam   [place] [facility], Amy J, NP               Vitamin D , Ergocalciferol , (DRISDOL ) 1.25 MG (50000 UNIT) CAPS capsule 12 capsule 0     Endocrinology:  Vitamins - Vitamin D  Supplementation 2 Failed - 09/22/2024  5:16 PM      Failed - Manual Review: Route requests for 50,000 IU strength to the provider      Passed - Ca in normal range and within 360 days    Calcium   Date Value Ref Range Status  03/27/2024 8.9 8.7 - 10.3 mg/dL Final         Passed - Vitamin D  in normal range and within 360 days    Vit D, 25-Hydroxy  Date Value Ref Range Status  03/27/2024 60.6 30.0 - 100.0 ng/mL Final    Comment:    Vitamin D  deficiency has been defined by the Institute of Medicine and an Endocrine Society practice guideline as a level of serum 25-OH vitamin D  less than 20 ng/mL (1,2). The Endocrine Society went on to further define vitamin D  insufficiency as a level between 21 and 29 ng/mL (2). 1. IOM (Institute of Medicine). 2010. Dietary reference    intakes for calcium  and D. [place]: The    [employer]. 2. Holick MF, Binkley [place], Bischoff-Ferrari HA, et al.    Evaluation, treatment, and prevention of vitamin D     deficiency: an Endocrine Society clinical practice    guideline. JCEM. 2011 Jul; 96(7):1911-30.          Passed - Valid encounter within last 12 months    Recent Outpatient Visits           1 month ago Ankle edema, bilateral    [place] [facility], [place], NP   2 months ago Primary hypertension   [place] [facility], Amy J, NP   3 months ago Sinusitis, unspecified chronicity, unspecified location   [facility] at [facility], Greig PARAS, NP   4 months  ago Primary hypertension   [place] [facility] at [facility], [place] J, NP   5 months ago Annual physical exam   [place] [facility], Amy J, NP               cloNIDine  (CATAPRES ) 0.1 MG tablet 180 tablet 0    Sig: Take 1 tablet (0.1 mg total) by mouth 2 (two) times daily.     Cardiovascular:  Alpha-2 Agonists Passed - 09/22/2024  5:16 PM      Passed - Last BP in normal range    BP Readings from Last 1 Encounters:  09/11/24 134/66         Passed - Last Heart Rate in normal range    Pulse Readings from Last 1 Encounters:  09/11/24 70         Passed - Valid encounter within last 6 months    Recent Outpatient Visits           1 month ago Ankle edema, bilateral   [place] [facility], [place], NP   2 months ago Primary hypertension   [place] [facility], Amy J, NP   3 months ago Sinusitis, unspecified chronicity, unspecified location   [facility] at [facility], Amy J, NP   4 months ago Primary hypertension   [place] [facility], Amy J, NP   5 months ago Annual physical exam   [place] [facility], Amy J, NP               Iron , Ferrous Sulfate , 325 (65 Fe) MG TABS 90 tablet 0    Sig: Take 325 mg by mouth daily.     Endocrinology:  Minerals - Iron  Supplementation Failed - 09/22/2024  5:16 PM      Failed - HGB in normal range and within 360 days    Hemoglobin  Date Value Ref Range Status  03/27/2024 10.3 (L) 11.1 - 15.9 g/dL Final         Failed - Fe (serum)  in normal range and within 360 days    Iron   Date Value Ref Range Status  01/15/2022 47 28 - 170 ug/dL Final   Saturation Ratios  Date Value Ref Range Status  01/15/2022 14 10.4 - 31.8 % Final         Failed - Ferritin in normal range and within 360 days    Ferritin  Date Value Ref Range Status  01/15/2022 19 11 - 307 ng/mL Final    Comment:    Performed at [facility], [address], [place], [place] [address]         Passed - HCT in normal range and within 360 days    Hematocrit  Date Value Ref Range Status  03/27/2024 34.0 34.0 - 46.6 % Final         Passed - RBC in normal range and within 360 days    RBC  Date Value Ref Range Status  03/27/2024 4.36 3.77 - 5.28 x10E6/uL Final  03/16/2022 4.45 3.87 - 5.11 MIL/uL Final         Passed - Valid encounter within last 12 months    Recent Outpatient Visits           1 month ago Ankle edema, bilateral   [place] [facility] at [facility]  Lorren Greig PARAS, NP   2 months ago Primary hypertension   [place] [facility], Amy J, NP   3 months ago Sinusitis, unspecified chronicity, unspecified location   [facility], Amy J, NP   4 months ago Primary hypertension   [place] [facility] at [facility], Amy J, NP   5 months ago Annual physical exam   [place] [facility], Amy J, NP               losartan  (COZAAR ) 100 MG tablet 90 tablet 0    Sig: Take 1 tablet (100 mg total) by mouth daily.     Cardiovascular:  Angiotensin Receptor Blockers Passed - 09/22/2024  5:16 PM      Passed - Cr in normal range and within 180 days    Creat  Date Value Ref Range Status  03/10/2022 0.63 0.60 - 1.00 mg/dL Final   Creatinine, Ser  Date Value Ref Range Status  03/27/2024 0.58 0.57 - 1.00 mg/dL Final         Passed - K in normal range and within 180 days    Potassium  Date Value Ref Range Status   03/27/2024 3.9 3.5 - 5.2 mmol/L Final         Passed - Patient is not pregnant      Passed - Last BP in normal range    BP Readings from Last 1 Encounters:  09/11/24 134/66         Passed - Valid encounter within last 6 months    Recent Outpatient Visits           1 month ago Ankle edema, bilateral   [place] [facility], [place], NP   2 months ago Primary hypertension   [place] [facility], Amy J, NP   3 months ago Sinusitis, unspecified chronicity, unspecified location   [facility], Amy J, NP   4 months ago Primary hypertension   [place] [facility], Amy J, NP   5 months ago Annual physical exam   [place] [facility], Amy J, NP               lovastatin  (MEVACOR ) 40 MG tablet 90 tablet 1    Sig: Take 1 tablet (40 mg total) by mouth at bedtime.     Cardiovascular:  Antilipid - Statins 2 Failed - 09/22/2024  5:16 PM      Failed - Lipid Panel in normal range within the last 12 months    Cholesterol, Total  Date Value Ref Range Status  03/27/2024 137 100 - 199 mg/dL Final   LDL Cholesterol (Calc)  Date Value Ref Range Status  03/10/2022 91 mg/dL (calc) Final    Comment:    Reference range: <100 . Desirable range <100 mg/dL for primary prevention;   <70 mg/dL for patients with CHD or diabetic patients  with > or = 2 CHD risk factors. SABRA LDL-C is now calculated using the Martin-Hopkins  calculation, which is a validated novel method providing  better accuracy than the Friedewald equation in the  estimation of LDL-C.  Gladis APPLETHWAITE et al. SANDREA. 7986;689(80): 2061-2068  (http://education.[employer].com/faq/FAQ164)    LDL Chol Calc (NIH)  Date Value Ref Range Status  03/27/2024 68 0 -  99 mg/dL Final   HDL  Date Value Ref Range Status  03/27/2024 46 >39 mg/dL Final   Triglycerides  Date Value  Ref Range Status  03/27/2024 127 0 - 149 mg/dL Final         Passed - Cr in normal range and within 360 days    Creat  Date Value Ref Range Status  03/10/2022 0.63 0.60 - 1.00 mg/dL Final   Creatinine, Ser  Date Value Ref Range Status  03/27/2024 0.58 0.57 - 1.00 mg/dL Final         Passed - Patient is not pregnant      Passed - Valid encounter within last 12 months    Recent Outpatient Visits           1 month ago Ankle edema, bilateral   [place] [facility], [place], NP   2 months ago Primary hypertension   [place] [facility], Amy J, NP   3 months ago Sinusitis, unspecified chronicity, unspecified location   [facility], Amy J, NP   4 months ago Primary hypertension   [place] [facility] at [facility], Amy J, NP   5 months ago Annual physical exam   [place] [facility], Amy J, NP               potassium chloride  (KLOR-CON ) 10 MEQ tablet 90 tablet 0    Sig: Take 1 tablet (10 mEq total) by mouth daily.     Endocrinology:  Minerals - Potassium Supplementation Passed - 09/22/2024  5:16 PM      Passed - K in normal range and within 360 days    Potassium  Date Value Ref Range Status  03/27/2024 3.9 3.5 - 5.2 mmol/L Final         Passed - Cr in normal range and within 360 days    Creat  Date Value Ref Range Status  03/10/2022 0.63 0.60 - 1.00 mg/dL Final   Creatinine, Ser  Date Value Ref Range Status  03/27/2024 0.58 0.57 - 1.00 mg/dL Final         Passed - Valid encounter within last 12 months    Recent Outpatient Visits           1 month ago Ankle edema, bilateral   [place] [facility] at [facility], [place], NP   2 months ago Primary hypertension   [place] [facility], Amy J, NP   3 months ago Sinusitis, unspecified chronicity, unspecified location    [facility], Amy J, NP   4 months ago Primary hypertension   [place] [facility] at [facility], [place], NP   5 months ago Annual physical exam   [facility] at [facility], Greig PARAS, NP

## 2024-09-26 ENCOUNTER — Other Ambulatory Visit: Payer: Self-pay | Admitting: Family Medicine

## 2024-09-26 ENCOUNTER — Other Ambulatory Visit: Payer: Self-pay

## 2024-09-26 ENCOUNTER — Encounter: Payer: Self-pay | Admitting: Physician Assistant

## 2024-09-26 ENCOUNTER — Ambulatory Visit: Admitting: Physician Assistant

## 2024-09-26 DIAGNOSIS — M79661 Pain in right lower leg: Secondary | ICD-10-CM

## 2024-09-26 DIAGNOSIS — S9491XA Injury of unspecified nerve at ankle and foot level, right leg, initial encounter: Secondary | ICD-10-CM

## 2024-09-26 DIAGNOSIS — G8929 Other chronic pain: Secondary | ICD-10-CM

## 2024-09-26 MED ORDER — GABAPENTIN 300 MG PO CAPS: 300.0000 mg | ORAL_CAPSULE | Freq: Four times a day (QID) | ORAL | 2 refills | Status: DC

## 2024-09-26 MED ORDER — FUROSEMIDE 20 MG PO TABS: 20.0000 mg | ORAL_TABLET | Freq: Every day | ORAL | 0 refills | Status: AC | PRN

## 2024-09-26 MED ORDER — DICLOFENAC SODIUM 75 MG PO TBEC: 75.0000 mg | DELAYED_RELEASE_TABLET | Freq: Two times a day (BID) | ORAL | 0 refills | Status: DC

## 2024-09-26 MED ORDER — IRON (FERROUS SULFATE) 325 (65 FE) MG PO TABS: 325.0000 mg | ORAL_TABLET | Freq: Every day | ORAL | 0 refills | Status: AC

## 2024-09-26 MED ORDER — CLONIDINE HCL 0.1 MG PO TABS: 0.1000 mg | ORAL_TABLET | Freq: Two times a day (BID) | ORAL | 0 refills | Status: DC

## 2024-09-26 MED ORDER — AMLODIPINE BESYLATE 10 MG PO TABS: 10.0000 mg | ORAL_TABLET | Freq: Every day | ORAL | 0 refills | Status: DC

## 2024-09-26 MED ORDER — POTASSIUM CHLORIDE ER 10 MEQ PO TBCR: 10.0000 meq | EXTENDED_RELEASE_TABLET | Freq: Every day | ORAL | 0 refills | Status: DC

## 2024-09-26 NOTE — Progress Notes (Signed)
 HPI: Hayley Padilla is a 73 year old female were seen for the first time for right leg ankle pain.  She has had swelling and pain that has been increasing over the last 3 months in her right leg and she points to the lateral fibular area.  She is also having swelling pain in the ankle mostly laterally.  She has been seen by podiatry who obtained x-rays of both ankles.  Films were personally reviewed and per my read show degenerative changes moderate over the ankle joint and bilateral pes planus.  No acute fractures.  MRI right ankle 08/29/2024 showed moderate tibiotalar and mild subtalar arthritis.  Tendinosis of the infra malleoli or peroneus longus and brevis tendons.  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  Left ankle MRI without contrast dated 08/29/2024 showed moderate tibial talar osteoarthritis with high-grade cartilage loss medial to the talar shoulder and severe degenerative changes within the medial gutter.  Loss of fat signal sinus Tarsi mild degenerative changes subtalar joint. Patient reports that she has had problems with her right foot since undergoing at least 3 back surgeries but she is not sure how long she is had problems with the right leg.  She notes that she has been unable to dorsiflex her foot for some time.  She cannot sleep or laying down as the foot does throb.  She has been given sinus Tarsi injection without any real relief. Patient is currently in pain management and on oxycodone .  Review of systems: See HPI otherwise negative  Physical exam: General Well-developed well-nourished female walks with an antalgic gait and uses a rollator to ambulate.   Psych: Alert and oriented x 3 Vascular dorsal pedal pulses are 2+ and equal and symmetric calves are supple and nontender. Bilateral feet: Obvious pes planus.  No rashes, ulcers or impending ulcers. Bilateral ankles she has full dorsiflexion plantarflexion of the left ankle actively.  5 out of 5  strength with inversion eversion against resistance left foot.  Extension of the left great toe against resistance reveals 5 out of 5 strength.  Right: Unable to dorsiflex the ankle able to slightly plantarflex the right ankle.  No ability to extend the right great toe. Unable to evert right foot against gravity.  Right foot foot is highly mobile.  Inversion against resistance 4-5 strength.  Sensation grossly intact bilateral feet.  Findings consistent with denervation of the right foot with only active motion involving the posterior tibial tendon preserved.  Radiographs: Right tib-fib 2 views: No acute fractures.  Mild to moderate degenerative changes right ankle.  Degenerative changes of the right knee.  No subluxation dislocation of the right knee.  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  Recommend she follow-up with neurosurgery.  We will have her fitted for an AFO brace.  Questions encouraged and answered at length.  Follow-up as needed.

## 2024-09-29 ENCOUNTER — Telehealth: Payer: Self-pay

## 2024-09-29 NOTE — Telephone Encounter (Signed)
 Copied from CRM #[id number]. Topic: Clinical - Prescription Issue >> Sep 29, 2024  3:06 PM Nathanel BROCKS wrote: Reason for CRM: diclofenac  (VOLTAREN ) 75 MG EC tablet  Pt has on file that she is allergic to Asprin and it is  composing of this  medicine has it in it.   Please advise [facility] how to proceed with filling this. [phone] ref # [id number]

## 2024-10-02 NOTE — Telephone Encounter (Signed)
 I called patient no one answered so I left a voicemail to return my call

## 2024-10-02 NOTE — Telephone Encounter (Signed)
 Call patient with update. Please confirm if patient is allergic to Diclofenac  (composed of Aspirin which is noted as an allergy in her chart). It appears patient has been taking Diclofenac  for chronic knee pain since 2020.

## 2024-10-05 NOTE — Telephone Encounter (Signed)
 I called patient no one answered so I left a voicemail for her to return my call

## 2024-10-06 NOTE — Telephone Encounter (Signed)
 Call patient to confirm if she is able to tolerate Diclofenac  due to Allergy List has Aspirin which is also related to Diclofenac .

## 2024-10-09 ENCOUNTER — Encounter: Admitting: Family

## 2024-10-09 NOTE — Progress Notes (Signed)
 Erroneous encounter-disregard

## 2024-10-11 ENCOUNTER — Encounter: Attending: Registered Nurse | Admitting: Registered Nurse

## 2024-10-12 NOTE — Telephone Encounter (Signed)
 I called patient no one answered so I left a message to return my call

## 2024-10-13 ENCOUNTER — Ambulatory Visit
Admission: RE | Admit: 2024-10-13 | Discharge: 2024-10-13 | Disposition: A | Discharge status: Home / Self Care | Source: Ambulatory Visit | Attending: Family | Admitting: Family

## 2024-10-13 DIAGNOSIS — Z1231 Encounter for screening mammogram for malignant neoplasm of breast: Secondary | ICD-10-CM

## 2024-10-13 NOTE — Telephone Encounter (Signed)
 Call patient to confirm if she is able to tolerate Diclofenac  due to Allergy List has Aspirin which is also related to Diclofenac .

## 2024-10-13 NOTE — Progress Notes (Signed)
 Subjective:    Patient ID: Hayley Padilla, female    DOB: [birth year], 73 y.o.   MRN: [id number]  HPI: Hayley Padilla is a 73 y.o. female who returns for follow up appointment for chronic pain and medication refill. She states her pain is located in her lower back and bilateral knee pain. She rates her pain 10. Her current exercise regime is walking and performing stretching exercises.  Ms. Latella Morphine  equivalent is 45.00 MME.   Last Oral Swab was Performed on 08/10/2024, it was consistent.      Pain Inventory Average Pain 7 Pain Right Now 10 My pain is intermittent, sharp, and aching  In the last 24 hours, has pain interfered with the following? General activity 10 Relation with others 8 Enjoyment of life 10 What TIME of day is your pain at its worst? morning , daytime, evening, night, and varies Sleep (in general) Good  Pain is worse with: walking, bending, sitting, standing, and some activites Pain improves with: rest, medication, and heat, exercise Relief from Meds: 5  Family History  Problem Relation Age of Onset   Colon cancer Neg Hx    Colon polyps Neg Hx    Esophageal cancer Neg Hx    Rectal cancer Neg Hx    Stomach cancer Neg Hx    Breast cancer Neg Hx    Social History   Socioeconomic History   Marital status: Widowed    Spouse name: Not on file   Number of children: Not on file   Years of education: Not on file   Highest education level: Not on file  Occupational History   Not on file  Tobacco Use   Smoking status: Never   Smokeless tobacco: Current    Types: Chew   Tobacco comments:    last used 03/17/2020 @ 2200  Vaping Use   Vaping status: Never Used  Substance and Sexual Activity   Alcohol use: Not Currently   Drug use: Never   Sexual activity: Not Currently  Other Topics Concern   Not on file  Social History Narrative   Not on file   Social Drivers of Health   Financial Resource Strain: Low Risk  (03/27/2024)   Overall Financial  Resource Strain (CARDIA)    Difficulty of Paying Living Expenses: Not hard at all  Food Insecurity: No Food Insecurity (03/27/2024)   Hunger Vital Sign    Worried About Running Out of Food in the Last Year: Never true    Ran Out of Food in the Last Year: Never true  Transportation Needs: No Transportation Needs (03/27/2024)   PRAPARE - Administrator, Civil Service (Medical): No    Lack of Transportation (Non-Medical): No  Physical Activity: Inactive (03/27/2024)   Exercise Vital Sign    Days of Exercise per Week: 0 days    Minutes of Exercise per Session: 0 min  Stress: No Stress Concern Present (03/27/2024)   [employer] of Occupational Health - Occupational Stress Questionnaire    Feeling of Stress : Only a little  Social Connections: Moderately Isolated (03/27/2024)   Social Connection and Isolation Panel    Frequency of Communication with Friends and Family: More than three times a week    Frequency of Social Gatherings with Friends and Family: Twice a week    Attends Religious Services: More than 4 times per year    Active Member of [employer] or Organizations: No    Attends Banker Meetings: Never  Marital Status: Widowed   Past Surgical History:  Procedure Laterality Date   BACK SURGERY     lower back   IR LUMBAR DISC ASPIRATION W/IMG GUIDE  01/15/2022   LAMINECTOMY WITH POSTERIOR LATERAL ARTHRODESIS LEVEL 1 N/A 03/16/2022   Procedure: Lumbar One-Two Posterior Lateral Fusion with pedicle screws;  Surgeon: Louis Shove, MD;  Location: [facility] OR;  Service: Neurosurgery;  Laterality: N/A;   MULTIPLE TOOTH EXTRACTIONS     Past Surgical History:  Procedure Laterality Date   BACK SURGERY     lower back   IR LUMBAR DISC ASPIRATION W/IMG GUIDE  01/15/2022   LAMINECTOMY WITH POSTERIOR LATERAL ARTHRODESIS LEVEL 1 N/A 03/16/2022   Procedure: Lumbar One-Two Posterior Lateral Fusion with pedicle screws;  Surgeon: Louis Shove, MD;  Location: [facility] OR;  Service: Neurosurgery;   Laterality: N/A;   MULTIPLE TOOTH EXTRACTIONS     Past Medical History:  Diagnosis Date   Allergy    Arthritis    Diabetes mellitus without complication (HCC)    Hyperlipidemia    Hypertension    PONV (postoperative nausea and vomiting)    There were no vitals taken for this visit.  Opioid Risk Score:   Fall Risk Score:  `1  Depression screen [facility] 2/9     09/11/2024    2:48 PM 08/04/2024    9:36 AM 07/07/2024    3:09 PM 06/06/2024    3:14 PM 03/27/2024    1:24 PM 02/17/2024    2:41 PM 01/17/2024    1:13 PM  Depression screen PHQ 2/9  Decreased Interest 0 1 0 0 0 0 0  Down, Depressed, Hopeless 0 1 0 2 0 0 0  PHQ - 2 Score 0 2 0 2 0 0 0  Altered sleeping    0     Tired, decreased energy    0     Change in appetite    1     Feeling bad or failure about yourself     0     Trouble concentrating    0     Moving slowly or fidgety/restless    1     Suicidal thoughts    0     PHQ-9 Score    4     Difficult doing work/chores  Somewhat difficult  Somewhat difficult       Review of Systems  Musculoskeletal:  Positive for back pain and gait problem.       Lower buttock pain  All other systems reviewed and are negative.      Objective:   Physical Exam Vitals reviewed.  Constitutional:      Appearance: Normal appearance. She is obese.  Cardiovascular:     Rate and Rhythm: Normal rate and regular rhythm.     Pulses: Normal pulses.     Heart sounds: Normal heart sounds.  Pulmonary:     Effort: Pulmonary effort is normal.     Breath sounds: Normal breath sounds.  Musculoskeletal:     Comments: Normal Muscle Bulk and Muscle Testing Reveals:  Upper Extremities: Decreased ROM  90 Degrees and Muscle Strength 5/5  Lower Extremities: Right: Decreased ROM and Muscle Strength 5/5 Left Lower Extremity: Full ROM and Muscle Strength 5/5 Arises from Chair slowly using walker for support Antalgic  Gait     Skin:    General: Skin is warm and dry.  Neurological:     Mental Status: She is  alert and oriented to person, place, and time.  Psychiatric:        Mood and Affect: Mood normal.        Behavior: Behavior normal.           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 Ates underwent on 03/17/2023 : Dr Louis : Lumbar One-Two Posterior Lateral Fusion with pedicle screws Continue HEP as Tolerated. Continue to Monitor. 10/16/2024 2. Lumbar Radiculitis: Continue Gabapentin . Continue to monitor. 10/16/2024 3. Chronic Bilateral Hip Pain: No complaints today. Continue HEP as Tolerated. Continue to Monitor. 10/16/2024 4. Chronic Pain of Bilateral Knees: R>LContinue HEP as Tolerated. Continue to Monitor. 10/16/2024 5. Chronic Pain Syndrome: Refilled: Oxycodone  7.5/325 mg one tablet 4 times a day as needed for pain #120.  We will continue the opioid monitoring program, this consists of regular clinic visits, examinations, urine drug screen, pill counts as well as use of [place]  Controlled Substance Reporting system. A 12 month History has been reviewed on the [place]  Controlled Substance Reporting System on 10/16/2024. 6, Left Shoulder pain: No complaints today. Continue HEP as Tolerated. Continue to monitor.  10/16/2024 7. Bilateral Ankle Pain: No complaints today. Continue HEP as Tolerated. Continue to Monitor. 10/16/2024 F/U in 1 month

## 2024-10-16 ENCOUNTER — Encounter: Payer: Self-pay | Admitting: Registered Nurse

## 2024-10-16 ENCOUNTER — Encounter: Payer: Self-pay | Admitting: Radiology

## 2024-10-16 ENCOUNTER — Encounter: Attending: Registered Nurse | Admitting: Registered Nurse

## 2024-10-16 VITALS — BP 127/68 | HR 69 | Ht 61.0 in | Wt 212.0 lb

## 2024-10-16 DIAGNOSIS — M5416 Radiculopathy, lumbar region: Secondary | ICD-10-CM

## 2024-10-16 DIAGNOSIS — G8929 Other chronic pain: Secondary | ICD-10-CM | POA: Diagnosis present

## 2024-10-16 DIAGNOSIS — M961 Postlaminectomy syndrome, not elsewhere classified: Secondary | ICD-10-CM | POA: Insufficient documentation

## 2024-10-16 DIAGNOSIS — G894 Chronic pain syndrome: Secondary | ICD-10-CM | POA: Diagnosis not present

## 2024-10-16 DIAGNOSIS — Z5181 Encounter for therapeutic drug level monitoring: Secondary | ICD-10-CM | POA: Diagnosis not present

## 2024-10-16 DIAGNOSIS — M170 Bilateral primary osteoarthritis of knee: Secondary | ICD-10-CM | POA: Diagnosis not present

## 2024-10-16 DIAGNOSIS — Z79891 Long term (current) use of opiate analgesic: Secondary | ICD-10-CM | POA: Insufficient documentation

## 2024-10-16 DIAGNOSIS — M1712 Unilateral primary osteoarthritis, left knee: Secondary | ICD-10-CM | POA: Insufficient documentation

## 2024-10-16 DIAGNOSIS — M545 Low back pain, unspecified: Secondary | ICD-10-CM | POA: Insufficient documentation

## 2024-10-16 DIAGNOSIS — M1711 Unilateral primary osteoarthritis, right knee: Secondary | ICD-10-CM | POA: Insufficient documentation

## 2024-10-16 MED ORDER — OXYCODONE-ACETAMINOPHEN 7.5-325 MG PO TABS: 1.0000 | ORAL_TABLET | Freq: Four times a day (QID) | ORAL | 0 refills | Status: DC | PRN

## 2024-10-17 ENCOUNTER — Ambulatory Visit: Payer: Self-pay | Admitting: Family

## 2024-10-17 NOTE — Telephone Encounter (Signed)
 Noted

## 2024-10-19 NOTE — Telephone Encounter (Signed)
 Noted. Complete.

## 2024-10-20 ENCOUNTER — Telehealth: Payer: Self-pay

## 2024-10-20 NOTE — Telephone Encounter (Signed)
 Copied from CRM [id number]. Topic: Clinical - Prescription Issue >> Oct 20, 2024 11:46 AM Gustabo D wrote: Diclofenac  Sodium 75 mg Oral 2 times daily- speak with pharmacy to clear this for pt Callback -[phone] opt 2 reference [phone] Gregory Berthold Pharmacy Tech [facility] mail service

## 2024-10-23 ENCOUNTER — Telehealth: Payer: Self-pay

## 2024-10-23 NOTE — Telephone Encounter (Signed)
 Copied from CRM [id number]. Topic: Clinical - Prescription Issue >> Oct 23, 2024 12:06 PM Tiffany B wrote: Reason for CRM: Caller would like to know if its ok to fill diclofenac  (VOLTAREN ) 75 MG EC tablet  due to aspirin being on patient allergy list.

## 2024-10-23 NOTE — Telephone Encounter (Signed)
 Patient states someone spk with her regarding this already.  (Not noted)   Patient states she was advised she could take it and was sent to pharmacy already.   Noted .

## 2024-10-23 NOTE — Telephone Encounter (Signed)
 I called pharmacy with recommendations  by pcp

## 2024-10-23 NOTE — Telephone Encounter (Signed)
 Please refer to prescription Sig below. Thank you.  Sig - Route: TAKE 1 TABLET TWICE A DAY. - Oral   Sent to pharmacy as: diclofenac  (VOLTAREN ) 75 MG EC tablet   E-Prescribing Status: Receipt confirmed by pharmacy (10/19/2024  8:57 AM EST)

## 2024-11-13 ENCOUNTER — Encounter: Payer: Self-pay | Admitting: Registered Nurse

## 2024-11-13 ENCOUNTER — Encounter: Attending: Registered Nurse | Admitting: Registered Nurse

## 2024-11-13 VITALS — BP 121/64 | HR 75 | Ht 61.0 in | Wt 211.4 lb

## 2024-11-13 DIAGNOSIS — M545 Low back pain, unspecified: Secondary | ICD-10-CM | POA: Insufficient documentation

## 2024-11-13 DIAGNOSIS — Z5181 Encounter for therapeutic drug level monitoring: Secondary | ICD-10-CM | POA: Diagnosis not present

## 2024-11-13 DIAGNOSIS — M961 Postlaminectomy syndrome, not elsewhere classified: Secondary | ICD-10-CM | POA: Diagnosis present

## 2024-11-13 DIAGNOSIS — Z79891 Long term (current) use of opiate analgesic: Secondary | ICD-10-CM | POA: Diagnosis present

## 2024-11-13 DIAGNOSIS — G894 Chronic pain syndrome: Secondary | ICD-10-CM | POA: Insufficient documentation

## 2024-11-13 DIAGNOSIS — G8929 Other chronic pain: Secondary | ICD-10-CM | POA: Insufficient documentation

## 2024-11-13 DIAGNOSIS — M1712 Unilateral primary osteoarthritis, left knee: Secondary | ICD-10-CM | POA: Diagnosis present

## 2024-11-13 DIAGNOSIS — M1711 Unilateral primary osteoarthritis, right knee: Secondary | ICD-10-CM | POA: Insufficient documentation

## 2024-11-13 MED ORDER — OXYCODONE-ACETAMINOPHEN 7.5-325 MG PO TABS: 1.0000 | ORAL_TABLET | Freq: Four times a day (QID) | ORAL | 0 refills | Status: DC | PRN

## 2024-11-13 NOTE — Progress Notes (Unsigned)
 Subjective:    Patient ID: Hayley Padilla, female    DOB: Jul 07, 1951, 73 y.o.   MRN: [id number]  HPI: Hayley Padilla is a 73 y.o. female who returns for follow up appointment for chronic pain and medication refill. states *** pain is located in  ***. rates pain ***. current exercise regime is walking and performing stretching exercises.  Hayley Padilla Morphine  equivalent is 30.00 MME.   Oral Swab was Performed today.   Pain Inventory Average Pain 8 Pain Right Now 9 My pain is dull  In the last 24 hours, has pain interfered with the following? General activity 10 Relation with others 10 Enjoyment of life 10 What TIME of day is your pain at its worst? daytime Sleep (in general) Good  Pain is worse with: walking, bending, and standing Pain improves with: heat/ice and medication Relief from Meds: 5  Family History  Problem Relation Age of Onset   Colon cancer Neg Hx    Colon polyps Neg Hx    Esophageal cancer Neg Hx    Rectal cancer Neg Hx    Stomach cancer Neg Hx    Breast cancer Neg Hx    Social History   Socioeconomic History   Marital status: Widowed    Spouse name: Not on file   Number of children: Not on file   Years of education: Not on file   Highest education level: Not on file  Occupational History   Not on file  Tobacco Use   Smoking status: Never   Smokeless tobacco: Current    Types: Chew   Tobacco comments:    last used 03/17/2020 @ 2200  Vaping Use   Vaping status: Never Used  Substance and Sexual Activity   Alcohol use: Not Currently   Drug use: Never   Sexual activity: Not Currently  Other Topics Concern   Not on file  Social History Narrative   Not on file   Social Drivers of Health   Financial Resource Strain: Low Risk  (03/27/2024)   Overall Financial Resource Strain (CARDIA)    Difficulty of Paying Living Expenses: Not hard at all  Food Insecurity: No Food Insecurity (03/27/2024)   Hunger Vital Sign    Worried About Running Out of Food in  the Last Year: Never true    Ran Out of Food in the Last Year: Never true  Transportation Needs: No Transportation Needs (03/27/2024)   PRAPARE - Administrator, Civil Service (Medical): No    Lack of Transportation (Non-Medical): No  Physical Activity: Inactive (03/27/2024)   Exercise Vital Sign    Days of Exercise per Week: 0 days    Minutes of Exercise per Session: 0 min  Stress: No Stress Concern Present (03/27/2024)   [employer] of Occupational Health - Occupational Stress Questionnaire    Feeling of Stress : Only a little  Social Connections: Moderately Isolated (03/27/2024)   Social Connection and Isolation Panel    Frequency of Communication with Friends and Family: More than three times a week    Frequency of Social Gatherings with Friends and Family: Twice a week    Attends Religious Services: More than 4 times per year    Active Member of [employer] or Organizations: No    Attends Banker Meetings: Never    Marital Status: Widowed   Past Surgical History:  Procedure Laterality Date   BACK SURGERY     lower back   IR LUMBAR DISC ASPIRATION  W/IMG GUIDE  01/15/2022   LAMINECTOMY WITH POSTERIOR LATERAL ARTHRODESIS LEVEL 1 N/A 03/16/2022   Procedure: Lumbar One-Two Posterior Lateral Fusion with pedicle screws;  Surgeon: Louis Shove, MD;  Location: [facility] OR;  Service: Neurosurgery;  Laterality: N/A;   MULTIPLE TOOTH EXTRACTIONS     Past Surgical History:  Procedure Laterality Date   BACK SURGERY     lower back   IR LUMBAR DISC ASPIRATION W/IMG GUIDE  01/15/2022   LAMINECTOMY WITH POSTERIOR LATERAL ARTHRODESIS LEVEL 1 N/A 03/16/2022   Procedure: Lumbar One-Two Posterior Lateral Fusion with pedicle screws;  Surgeon: Louis Shove, MD;  Location: [facility] OR;  Service: Neurosurgery;  Laterality: N/A;   MULTIPLE TOOTH EXTRACTIONS     Past Medical History:  Diagnosis Date   Allergy    Arthritis    Diabetes mellitus without complication (HCC)    Hyperlipidemia     Hypertension    PONV (postoperative nausea and vomiting)    BP 121/64 (BP Location: Left Arm, Patient Position: Sitting, Cuff Size: Large)   Pulse 75   Ht 5' 1 (1.549 m)   Wt 211 lb 6.4 oz (95.9 kg)   SpO2 98%   BMI 39.94 kg/m   Opioid Risk Score:   Fall Risk Score:  `1  Depression screen [facility] 2/9     09/11/2024    2:48 PM 08/04/2024    9:36 AM 07/07/2024    3:09 PM 06/06/2024    3:14 PM 03/27/2024    1:24 PM 02/17/2024    2:41 PM 01/17/2024    1:13 PM  Depression screen PHQ 2/9  Decreased Interest 0 1 0 0 0 0 0  Down, Depressed, Hopeless 0 1 0 2 0 0 0  PHQ - 2 Score 0 2 0 2 0 0 0  Altered sleeping    0     Tired, decreased energy    0     Change in appetite    1     Feeling bad or failure about yourself     0     Trouble concentrating    0     Moving slowly or fidgety/restless    1     Suicidal thoughts    0     PHQ-9 Score    4      Difficult doing work/chores  Somewhat difficult  Somewhat difficult        Data saved with a previous flowsheet row definition      Review of Systems  Musculoskeletal:  Positive for back pain.       Low back pain, right knee and foot pain  All other systems reviewed and are negative.      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 Continue to Monitor. 10/16/2024 2. Lumbar Radiculitis: Continue Gabapentin . Continue to monitor. 10/16/2024 3. Chronic Bilateral Hip Pain: No complaints today. Continue HEP as Tolerated. Continue to Monitor. 10/16/2024 4. Chronic Pain of Bilateral Knees: R>LContinue HEP as Tolerated. Continue to Monitor. 10/16/2024 5. Chronic Pain Syndrome: Refilled: Oxycodone  7.5/325 mg one tablet 4 times a day as needed for pain #120.  We will continue the opioid monitoring program, this consists of regular clinic  visits, examinations, urine drug screen, pill counts as well as use of [place]  Controlled  Substance Reporting system. A 12 month History has been reviewed on the [place]  Controlled Substance Reporting System on 10/16/2024. 6, Left Shoulder pain: No complaints today. Continue HEP as Tolerated. Continue to monitor.  10/16/2024 7. Bilateral Ankle Pain: No complaints today. Continue HEP as Tolerated. Continue to Monitor. 10/16/2024 F/U in 1 month

## 2024-11-14 ENCOUNTER — Ambulatory Visit: Admitting: Family

## 2024-11-14 ENCOUNTER — Encounter: Payer: Self-pay | Admitting: Family

## 2024-11-14 VITALS — BP 130/70 | HR 76 | Temp 98.3°F | Resp 16 | Ht 61.0 in | Wt 207.2 lb

## 2024-11-14 DIAGNOSIS — I10 Essential (primary) hypertension: Secondary | ICD-10-CM | POA: Diagnosis not present

## 2024-11-14 DIAGNOSIS — E119 Type 2 diabetes mellitus without complications: Secondary | ICD-10-CM

## 2024-11-14 DIAGNOSIS — M792 Neuralgia and neuritis, unspecified: Secondary | ICD-10-CM

## 2024-11-14 DIAGNOSIS — E1165 Type 2 diabetes mellitus with hyperglycemia: Secondary | ICD-10-CM | POA: Diagnosis not present

## 2024-11-14 DIAGNOSIS — E559 Vitamin D deficiency, unspecified: Secondary | ICD-10-CM

## 2024-11-14 DIAGNOSIS — E785 Hyperlipidemia, unspecified: Secondary | ICD-10-CM

## 2024-11-14 DIAGNOSIS — E876 Hypokalemia: Secondary | ICD-10-CM

## 2024-11-14 DIAGNOSIS — Z7984 Long term (current) use of oral hypoglycemic drugs: Secondary | ICD-10-CM

## 2024-11-14 DIAGNOSIS — G8929 Other chronic pain: Secondary | ICD-10-CM

## 2024-11-14 DIAGNOSIS — M25569 Pain in unspecified knee: Secondary | ICD-10-CM

## 2024-11-14 MED ORDER — GABAPENTIN 300 MG PO CAPS: 300.0000 mg | ORAL_CAPSULE | Freq: Four times a day (QID) | ORAL | 2 refills | Status: AC

## 2024-11-14 MED ORDER — DICLOFENAC SODIUM 75 MG PO TBEC: 75.0000 mg | DELAYED_RELEASE_TABLET | Freq: Two times a day (BID) | ORAL | 0 refills | Status: DC

## 2024-11-14 MED ORDER — LOSARTAN POTASSIUM 100 MG PO TABS: 100.0000 mg | ORAL_TABLET | Freq: Every day | ORAL | 0 refills | Status: AC

## 2024-11-14 MED ORDER — AMLODIPINE BESYLATE 10 MG PO TABS: 10.0000 mg | ORAL_TABLET | Freq: Every day | ORAL | 0 refills | Status: AC

## 2024-11-14 MED ORDER — CLONIDINE HCL 0.1 MG PO TABS: 0.1000 mg | ORAL_TABLET | Freq: Two times a day (BID) | ORAL | 0 refills | Status: AC

## 2024-11-14 MED ORDER — VITAMIN D (ERGOCALCIFEROL) 1.25 MG (50000 UNIT) PO CAPS: 50000.0000 [IU] | ORAL_CAPSULE | ORAL | 0 refills | Status: AC

## 2024-11-14 MED ORDER — LOVASTATIN 40 MG PO TABS: 40.0000 mg | ORAL_TABLET | Freq: Every day | ORAL | 0 refills | Status: AC

## 2024-11-14 MED ORDER — POTASSIUM CHLORIDE ER 10 MEQ PO TBCR: 10.0000 meq | EXTENDED_RELEASE_TABLET | Freq: Every day | ORAL | 0 refills | Status: AC

## 2024-11-14 NOTE — Progress Notes (Signed)
 Patient ID: Hayley Padilla, female    DOB: 1951-11-01  MRN: [id number]  CC: Chronic Conditions Follow-Up  Subjective: Hayley Padilla is a 73 y.o. female who presents for chronic conditions follow-up.   Her concerns today include:  - Doing well on Amlodipine , Losartan , and Clonidine , no issues/concerns. She does not complain of red flag symptoms such as but not limited to chest pain, shortness of breath, worst headache of life, nausea/vomiting.  - States pharmacy told her Sitagliptin  is $200 out of pocket. Also, states Sitagliptin  causes her to sweat. States she does not want to take diabetes medication if her hemoglobin A1c returns normal. States her health insurance tests her hemoglobin A1c through urine and always tells her that her hemoglobin A1c is normal. Denies red flag symptoms associated with diabetes.  - Due for diabetic eye exam.  - Due for diabetic foot exam. - Doing well on Lovastatin , no issues/concerns.  - Doing well on Potassium Chloride , no issues/concerns. - Doing well on Vitamin D , no issues/concerns.  - Doing well on Diclofenac , no issues/concerns. - Doing well on Gabapentin , no issues/concerns.  - States she does not have anxiety depression. States she feels the way she feels because of her sinuses and she has an appointment with specialist on tomorrow. She denies thoughts of self-harm, suicidal ideations, homicidal ideations.  Patient Active Problem List   Diagnosis Date Noted   Chronic rhinitis 07/12/2024   Deviated nasal septum 07/12/2024   Hypertrophy of nasal turbinates 07/12/2024   Diabetes mellitus, type 2 (HCC) 06/24/2023   Vertebral osteomyelitis (HCC) 03/12/2022   Hypokalemia 03/12/2022   Edema of right lower extremity 01/15/2022   Essential hypertension 01/14/2022   Chronic pain 01/14/2022   HLD (hyperlipidemia) 01/14/2022   Discitis 01/14/2022   Obesity (BMI 30-39.9) 01/13/2022   S/P lumbar spinal fusion 01/13/2022   Discitis of lumbar region  01/13/2022   Kyphosis 01/13/2022   Degenerative spondylolisthesis 06/12/2019     Current Outpatient Medications on File Prior to Visit  Medication Sig Dispense Refill   acetaminophen  (TYLENOL ) 650 MG CR tablet Take 650 mg by mouth every 8 (eight) hours as needed for pain.     benzonatate  (TESSALON ) 100 MG capsule Take 1 capsule (100 mg total) by mouth 3 (three) times daily as needed for cough. 30 capsule 0   Continuous Glucose Receiver (DEXCOM G6 RECEIVER) DEVI 1 each by Other route 4 (four) times daily -  before meals and at bedtime. 1 each 2   Continuous Glucose Receiver (FREESTYLE LIBRE 2 READER) DEVI 1 each by Other route 4 (four) times daily -  before meals and at bedtime. 1 each 2   Continuous Glucose Sensor (DEXCOM G6 SENSOR) MISC 1 each by Other route 4 (four) times daily -  before meals and at bedtime. 3 each 2   Continuous Glucose Sensor (FREESTYLE LIBRE 2 PLUS SENSOR) MISC 1 each by Other route 4 (four) times daily -  before meals and at bedtime. 1 each 2   Continuous Glucose Transmitter (DEXCOM G6 TRANSMITTER) MISC 1 each by Other route 4 (four) times daily -  before meals and at bedtime. 1 each 2   fluticasone  (FLONASE ) 50 MCG/ACT nasal spray Place 2 sprays into both nostrils daily. 16 g 10   furosemide  (LASIX ) 20 MG tablet Take 1 tablet (20 mg total) by mouth daily as needed. 90 tablet 0   guaiFENesin  200 MG tablet Take 1 tablet (200 mg total) by mouth every 4 (four) hours as needed  for cough or to loosen phlegm. 30 tablet 1   Iron , Ferrous Sulfate , 325 (65 Fe) MG TABS Take 325 mg by mouth daily. 90 tablet 0   oxyCODONE -acetaminophen  (PERCOCET) 7.5-325 MG tablet Take 1 tablet by mouth 4 (four) times daily as needed. 120 tablet 0   polyethylene glycol (MIRALAX  / GLYCOLAX ) 17 g packet Take 17 g by mouth daily as needed. 90 each 0   senna-docusate (SENOKOT-S) 8.6-50 MG tablet Take 1 tablet by mouth at bedtime.     sitaGLIPtin  (JANUVIA ) 25 MG tablet Take 1 tablet (25 mg total) by mouth  daily. 90 tablet 0   No current facility-administered medications on file prior to visit.    Allergies  Allergen Reactions   Penicillins Anaphylaxis    Did it involve swelling of the face/tongue/throat, SOB, or low BP? Yes Did it involve sudden or severe rash/hives, skin peeling, or any reaction on the inside of your mouth or nose? No Did you need to seek medical attention at a hospital or doctor's office? Yes When did it last happen?      Been a While  If all above answers are "NO", may proceed with cephalosporin use.    Aspirin Other (See Comments)    brusing _ patient requested to be listed    Social History   Socioeconomic History   Marital status: Widowed    Spouse name: Not on file   Number of children: Not on file   Years of education: Not on file   Highest education level: Not on file  Occupational History   Not on file  Tobacco Use   Smoking status: Never   Smokeless tobacco: Current    Types: Chew   Tobacco comments:    last used 03/17/2020 @ 2200  Vaping Use   Vaping status: Never Used  Substance and Sexual Activity   Alcohol use: Not Currently   Drug use: Never   Sexual activity: Not Currently  Other Topics Concern   Not on file  Social History Narrative   Not on file   Social Drivers of Health   Financial Resource Strain: Low Risk  (03/27/2024)   Overall Financial Resource Strain (CARDIA)    Difficulty of Paying Living Expenses: Not hard at all  Food Insecurity: No Food Insecurity (03/27/2024)   Hunger Vital Sign    Worried About Running Out of Food in the Last Year: Never true    Ran Out of Food in the Last Year: Never true  Transportation Needs: No Transportation Needs (03/27/2024)   PRAPARE - Administrator, Civil Service (Medical): No    Lack of Transportation (Non-Medical): No  Physical Activity: Inactive (03/27/2024)   Exercise Vital Sign    Days of Exercise per Week: 0 days    Minutes of Exercise per Session: 0 min  Stress: No  Stress Concern Present (03/27/2024)   [employer] of Occupational Health - Occupational Stress Questionnaire    Feeling of Stress : Only a little  Social Connections: Moderately Isolated (03/27/2024)   Social Connection and Isolation Panel    Frequency of Communication with Friends and Family: More than three times a week    Frequency of Social Gatherings with Friends and Family: Twice a week    Attends Religious Services: More than 4 times per year    Active Member of [employer] or Organizations: No    Attends Banker Meetings: Never    Marital Status: Widowed  Intimate Partner Violence: Not At  Risk (03/27/2024)   Humiliation, Afraid, Rape, and Kick questionnaire    Fear of Current or Ex-Partner: No    Emotionally Abused: No    Physically Abused: No    Sexually Abused: No    Family History  Problem Relation Age of Onset   Colon cancer Neg Hx    Colon polyps Neg Hx    Esophageal cancer Neg Hx    Rectal cancer Neg Hx    Stomach cancer Neg Hx    Breast cancer Neg Hx     Past Surgical History:  Procedure Laterality Date   BACK SURGERY     lower back   IR LUMBAR DISC ASPIRATION W/IMG GUIDE  01/15/2022   LAMINECTOMY WITH POSTERIOR LATERAL ARTHRODESIS LEVEL 1 N/A 03/16/2022   Procedure: Lumbar One-Two Posterior Lateral Fusion with pedicle screws;  Surgeon: Louis Shove, MD;  Location: [facility] OR;  Service: Neurosurgery;  Laterality: N/A;   MULTIPLE TOOTH EXTRACTIONS      ROS: Review of Systems Negative except as stated above  PHYSICAL EXAM: BP 130/70   Pulse 76   Temp 98.3 F (36.8 C) (Oral)   Resp 16   Ht 5' 1 (1.549 m)   Wt 207 lb 3.2 oz (94 kg)   SpO2 98%   BMI 39.15 kg/m   Physical Exam HENT:     Head: Normocephalic and atraumatic.     Nose: Nose normal.     Mouth/Throat:     Mouth: Mucous membranes are moist.     Pharynx: Oropharynx is clear.  Eyes:     Extraocular Movements: Extraocular movements intact.     Conjunctiva/sclera: Conjunctivae normal.      Pupils: Pupils are equal, round, and reactive to light.  Cardiovascular:     Rate and Rhythm: Normal rate and regular rhythm.     Pulses: Normal pulses.     Heart sounds: Normal heart sounds.  Pulmonary:     Effort: Pulmonary effort is normal.     Breath sounds: Normal breath sounds.  Musculoskeletal:        General: Normal range of motion.     Cervical back: Normal range of motion and neck supple.  Neurological:     General: No focal deficit present.     Mental Status: She is alert and oriented to person, place, and time.  Psychiatric:        Mood and Affect: Mood normal.        Behavior: Behavior normal.    ASSESSMENT AND PLAN: 1. Primary hypertension (Primary) - Continue Amlodipine , Losartan , and Clonidine  as prescribed.  - Routine screening.  - Counseled on blood pressure goal of less than 130/80, low-sodium, DASH diet, medication compliance, and 150 minutes of moderate intensity exercise per week as tolerated. Counseled on medication adherence and adverse effects. - Follow-up with primary provider in 3 months or sooner if needed.  - Basic Metabolic Panel - amLODipine  (NORVASC ) 10 MG tablet; Take 1 tablet (10 mg total) by mouth daily.  Dispense: 90 tablet; Refill: 0 - losartan  (COZAAR ) 100 MG tablet; Take 1 tablet (100 mg total) by mouth daily.  Dispense: 90 tablet; Refill: 0 - cloNIDine  (CATAPRES ) 0.1 MG tablet; Take 1 tablet (0.1 mg total) by mouth 2 (two) times daily.  Dispense: 180 tablet; Refill: 0  2. Type 2 diabetes mellitus with hyperglycemia, without long-term current use of insulin (HCC) - Hemoglobin A1c result pending. - Patient intolerant to Metformin .  - Patient states Sitagliptin  costs $200 out of pocket and causes  side effect of sweating.  - Patient states she prefers to not take diabetes medication if her hemoglobin A1c result returns normal.  - Routine screening.  - Discussed the importance of healthy eating habits, low-carbohydrate diet, low-sugar diet,  regular aerobic exercise (at least 150 minutes a week as tolerated) and medication compliance to achieve or maintain control of diabetes. Counseled on medication adherence/adverse effects.  - Follow-up with primary provider as scheduled.  - Microalbumin / creatinine urine ratio - Hemoglobin A1c  3. Diabetic eye exam [facility]) - Referral to Ophthalmology for evaluation/management. - Ambulatory referral to Ophthalmology  4. Encounter for diabetic foot exam [facility]) - Referral to Podiatry for evaluation/management.  - Ambulatory referral to Podiatry  5. Hyperlipidemia, unspecified hyperlipidemia type - Continue Lovastatin  as prescribed. Counseled on medication adherence/adverse effects.  - Follow-up with primary provider in 3 months or sooner if needed.  - lovastatin  (MEVACOR ) 40 MG tablet; Take 1 tablet (40 mg total) by mouth at bedtime.  Dispense: 90 tablet; Refill: 0  6. Hypokalemia - Continue Potassium Chloride  as prescribed. Counseled on medication adherence/adverse effects.  - Follow-up with primary provider in 3 months or sooner if needed. - potassium chloride  (KLOR-CON ) 10 MEQ tablet; Take 1 tablet (10 mEq total) by mouth daily.  Dispense: 90 tablet; Refill: 0  7. Vitamin D  deficiency - Continue Vitamin D  Ergocalciferol  as prescribed. Counseled on medication adherence/adverse effects.  - Routine screening.  - Follow-up with primary provider as scheduled.  - Vitamin D , 25-hydroxy - Vitamin D , Ergocalciferol , (DRISDOL ) 1.25 MG (50000 UNIT) CAPS capsule; Take 1 capsule (50,000 Units total) by mouth every 7 (seven) days for 12 doses.  Dispense: 12 capsule; Refill: 0  8. Chronic knee pain, unspecified laterality - Continue Diclofenac  as prescribed. Counseled on medication adherence/adverse effects.  - Follow-up with primary provider in 3 months or sooner if needed.  - diclofenac  (VOLTAREN ) 75 MG EC tablet; Take 1 tablet (75 mg total) by mouth 2 (two) times daily.  Dispense: 180 tablet;  Refill: 0  9. Neuropathic pain - Continue Gabapentin  as prescribed. Counseled on medication adherence/adverse effects.  - Follow-up with primary provider in 3 months or sooner if needed.  - gabapentin  (NEURONTIN ) 300 MG capsule; Take 1 capsule (300 mg total) by mouth 4 (four) times daily.  Dispense: 120 capsule; Refill: 2   Patient was given the opportunity to ask questions.  Patient verbalized understanding of the plan and was able to repeat key elements of the plan. Patient was given clear instructions to go to Emergency Department or return to medical center if symptoms don't improve, worsen, or new problems develop.The patient verbalized understanding.   Orders Placed This Encounter  Procedures   Microalbumin / creatinine urine ratio   Basic Metabolic Panel   Hemoglobin A1c   Vitamin D , 25-hydroxy   Ambulatory referral to Ophthalmology   Ambulatory referral to Podiatry     Requested Prescriptions   Signed Prescriptions Disp Refills   amLODipine  (NORVASC ) 10 MG tablet 90 tablet 0    Sig: Take 1 tablet (10 mg total) by mouth daily.   losartan  (COZAAR ) 100 MG tablet 90 tablet 0    Sig: Take 1 tablet (100 mg total) by mouth daily.   cloNIDine  (CATAPRES ) 0.1 MG tablet 180 tablet 0    Sig: Take 1 tablet (0.1 mg total) by mouth 2 (two) times daily.   lovastatin  (MEVACOR ) 40 MG tablet 90 tablet 0    Sig: Take 1 tablet (40 mg total) by mouth at bedtime.  potassium chloride  (KLOR-CON ) 10 MEQ tablet 90 tablet 0    Sig: Take 1 tablet (10 mEq total) by mouth daily.   Vitamin D , Ergocalciferol , (DRISDOL ) 1.25 MG (50000 UNIT) CAPS capsule 12 capsule 0    Sig: Take 1 capsule (50,000 Units total) by mouth every 7 (seven) days for 12 doses.   diclofenac  (VOLTAREN ) 75 MG EC tablet 180 tablet 0    Sig: Take 1 tablet (75 mg total) by mouth 2 (two) times daily.   gabapentin  (NEURONTIN ) 300 MG capsule 120 capsule 2    Sig: Take 1 capsule (300 mg total) by mouth 4 (four) times daily.     Return in about 3 months (around 02/12/2025) for Follow-Up or next available chronic conditions.  Greig JINNY Chute, NP

## 2024-11-14 NOTE — Progress Notes (Signed)
 3 month follow up, patient scored a 11 on PHQ-9, needs to talk to you about her diabetes medication

## 2024-11-16 ENCOUNTER — Ambulatory Visit (INDEPENDENT_AMBULATORY_CARE_PROVIDER_SITE_OTHER): Admitting: Otolaryngology

## 2024-11-16 ENCOUNTER — Encounter (INDEPENDENT_AMBULATORY_CARE_PROVIDER_SITE_OTHER): Payer: Self-pay | Admitting: Otolaryngology

## 2024-11-16 VITALS — BP 148/74 | HR 68 | Ht 60.5 in | Wt 207.0 lb

## 2024-11-16 DIAGNOSIS — J343 Hypertrophy of nasal turbinates: Secondary | ICD-10-CM

## 2024-11-16 DIAGNOSIS — J3489 Other specified disorders of nose and nasal sinuses: Secondary | ICD-10-CM

## 2024-11-16 DIAGNOSIS — J342 Deviated nasal septum: Secondary | ICD-10-CM

## 2024-11-16 DIAGNOSIS — J310 Chronic rhinitis: Secondary | ICD-10-CM

## 2024-11-16 LAB — DRUG TOX MONITOR 1 W/CONF, ORAL FLD
Amphetamines: NEGATIVE ng/mL (ref <10)
Barbiturates: NEGATIVE ng/mL (ref <10)
Benzodiazepines: NEGATIVE ng/mL (ref <0.50)
Buprenorphine: NEGATIVE ng/mL (ref <0.10)
Cocaine: NEGATIVE ng/mL (ref <5.0)
Codeine: NEGATIVE ng/mL (ref <2.5)
Cotinine: >250 ng/mL — ABNORMAL HIGH (ref <5.0)
Dihydrocodeine: NEGATIVE ng/mL (ref <2.5)
Fentanyl: NEGATIVE ng/mL (ref <0.10)
Heroin Metabolite: NEGATIVE ng/mL (ref <1.0)
Hydrocodone: NEGATIVE ng/mL (ref <2.5)
Hydromorphone: NEGATIVE ng/mL (ref <2.5)
MARIJUANA: NEGATIVE ng/mL (ref <2.5)
MDMA: NEGATIVE ng/mL (ref <10)
Meprobamate: NEGATIVE ng/mL (ref <2.5)
Methadone: NEGATIVE ng/mL (ref <5.0)
Morphine: NEGATIVE ng/mL (ref <2.5)
Nicotine Metabolite: POSITIVE ng/mL — AB (ref <5.0)
Norhydrocodone: NEGATIVE ng/mL (ref <2.5)
Noroxycodone: 6.9 ng/mL — ABNORMAL HIGH (ref <2.5)
Opiates: POSITIVE ng/mL — AB (ref <2.5)
Oxycodone: 46.4 ng/mL — ABNORMAL HIGH (ref <2.5)
Oxymorphone: NEGATIVE ng/mL (ref <2.5)
Phencyclidine: NEGATIVE ng/mL (ref <10)
Tapentadol: NEGATIVE ng/mL (ref <5.0)
Tramadol: NEGATIVE ng/mL (ref <5.0)
Zolpidem: NEGATIVE ng/mL (ref <5.0)

## 2024-11-16 LAB — BASIC METABOLIC PANEL WITH GFR
BUN/Creatinine Ratio: 20 (ref 12–28)
BUN: 13 mg/dL (ref 8–27)
CO2: 20 mmol/L (ref 20–29)
Calcium: 9.1 mg/dL (ref 8.7–10.3)
Chloride: 104 mmol/L (ref 96–106)
Creatinine, Ser: 0.64 mg/dL (ref 0.57–1.00)
Glucose: 83 mg/dL (ref 70–99)
Potassium: 4.2 mmol/L (ref 3.5–5.2)
Sodium: 145 mmol/L — ABNORMAL HIGH (ref 134–144)
eGFR: 93 mL/min/1.73 (ref >59)

## 2024-11-16 LAB — HEMOGLOBIN A1C
Est. average glucose Bld gHb Est-mCnc: 131 mg/dL
Hgb A1c MFr Bld: 6.2 % — ABNORMAL HIGH (ref 4.8–5.6)

## 2024-11-16 LAB — VITAMIN D 25 HYDROXY (VIT D DEFICIENCY, FRACTURES): Vit D, 25-Hydroxy: 67.4 ng/mL (ref 30.0–100.0)

## 2024-11-16 LAB — DRUG TOX ALC METAB W/CON, ORAL FLD: Alcohol Metabolite: NEGATIVE ng/mL (ref <25)

## 2024-11-16 NOTE — Progress Notes (Unsigned)
 Patient ID: Hayley Padilla, female   DOB: 1951/02/12, 73 y.o.   MRN: [id number]  Follow up: Chronic nasal obstruction  History of Present Illness Hayley Padilla is a 73 year old female who returns today for follow-up evaluation of her chronic nasal obstruction.  She was last seen in September 2025.  At that time, she was noted to have significant nasal obstruction, secondary to nasal septal deviation and bilateral inferior turbinate hypertrophy.  She was treated with Flonase  nasal spray and nasal saline irrigation.  She was also treated with multiple allergy medications.  The patient returns today reporting persistent nasal obstruction, despite continuing medical treatment since July 2025.  She has significant difficulty breathing through her nostrils.  She is a habitual mouth breather.  Currently she denies any facial pain, fever, or visual change.  No heart or lung issues, including heart attacks or lung problems. She is not currently taking any other medications aside from the nasal spray.  Exam: General: Communicates without difficulty, well nourished, no acute distress. Head: Normocephalic, no evidence injury, no tenderness, facial buttresses intact without stepoff. Face/sinus: No tenderness to palpation and percussion. Facial movement is normal and symmetric. Eyes: PERRL, EOMI. No scleral icterus, conjunctivae clear. Neuro: CN II exam reveals vision grossly intact.  No nystagmus at any point of gaze. Ears: Auricles well formed without lesions.  Ear canals are intact without mass or lesion.  No erythema or edema is appreciated.  The TMs are intact without fluid. Nose: External evaluation reveals normal support and skin without lesions.  Dorsum is intact.  Anterior rhinoscopy reveals congested mucosa over anterior aspect of inferior turbinates and deviated septum.  No purulence noted. Oral:  Oral cavity and oropharynx are intact, symmetric, without erythema or edema.  Mucosa is moist without lesions.  Neck: Full range of motion without pain.  There is no significant lymphadenopathy.  No masses palpable.  Thyroid  bed within normal limits to palpation.  Parotid glands and submandibular glands equal bilaterally without mass.  Trachea is midline. Neuro:  CN 2-12 grossly intact.   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  The patient has not responded to medical treatment for the past 6 months. -The physical exam findings are reviewed with the patient. -Based on the above findings, the patient will benefit from surgical intervention with septoplasty and bilateral inferior turbinate reduction.  The risk, benefits, alternatives, and details of the procedures are extensively discussed.  Questions are invited and answered. - Scheduled septoplasty and turbinate reduction surgery at an outpatient surgical center. - Advised her to avoid lifting heavy objects for 3-4 days post-surgery. - The patient would like to proceed with the procedures.  We will schedule the procedures in accordance with the patient schedule.

## 2024-11-17 ENCOUNTER — Ambulatory Visit: Payer: Self-pay | Admitting: Family

## 2024-11-17 DIAGNOSIS — E1165 Type 2 diabetes mellitus with hyperglycemia: Secondary | ICD-10-CM

## 2024-11-17 LAB — MICROALBUMIN / CREATININE URINE RATIO
Creatinine, Urine: 102 mg/dL
Microalb/Creat Ratio: 13 mg/g{creat} (ref 0–29)
Microalbumin, Urine: 13.2 ug/mL

## 2024-11-17 MED ORDER — EMPAGLIFLOZIN 10 MG PO TABS: 10.0000 mg | ORAL_TABLET | Freq: Every day | ORAL | 0 refills | Status: AC

## 2024-11-22 ENCOUNTER — Other Ambulatory Visit: Payer: Self-pay | Admitting: Pharmacist

## 2024-11-22 NOTE — Progress Notes (Signed)
 Pharmacy Quality Measure Review  This patient is appearing on a report for the adherence measure for diabetes medications this calendar year.   Medication: Januvia  Per PCP note, Januvia  is cost-prohibitive and medication causes side effects. No further follow-up needed at this time.   Herlene Fleeta Morris, PharmD, JAQUELINE, CPP Clinical Pharmacist [facility] [phone]

## 2024-11-29 ENCOUNTER — Telehealth: Payer: Self-pay

## 2024-11-29 NOTE — Telephone Encounter (Signed)
 Copied from CRM #[id number]. Topic: Clinical - Medication Question >> Nov 28, 2024  2:39 PM Antony S wrote: Reason for CRM: has questions about her losartan  and potassium [phone]

## 2024-11-29 NOTE — Telephone Encounter (Unsigned)
 Copied from CRM #[id number]. Topic: Clinical - Medication Question >> Nov 28, 2024  2:39 PM Antony S wrote: Reason for CRM: has questions about her losartan  and potassium [phone] >> Nov 29, 2024 11:58 AM Joesph B wrote: Patient states her medication is different, she's never received her medications all in one pill. Hydrochloride Losartan  - potassium .SABRA She is requesting to speak to a nurse as soon as possible. Please give her a call.

## 2024-11-30 NOTE — Telephone Encounter (Signed)
 Losartan  prescribed (11/14/2024  3:30 PM EST) during office visit. Upon medication list review I do not see Losartan -Potassium.

## 2024-12-01 ENCOUNTER — Telehealth: Payer: Self-pay

## 2024-12-01 NOTE — Telephone Encounter (Signed)
 Patient returned to office and I explained all information to her. Patient expressed understanding and appreciation

## 2024-12-01 NOTE — Telephone Encounter (Signed)
 Noted. Please let me know if I can further assist.

## 2024-12-01 NOTE — Telephone Encounter (Signed)
 Patient came in to office with her medications confused on why she was given clonidine  hydrochloride when she is usually prescribed just clonidine . She was also wondering why she was sent losartan  potassium as a combination pill instead of seperately.   After [facility] and speaking to [facility] I was able to clear up all the confusion and misunderstanding: -Clonidine  hydrochloride is the generic equivalent of the name brand Catapres  / clonidine . I confirmed with her that is the same drug formulary that was originally prescribed by Amy.  -Losartan  Potassium is the generic equivalent of Cozaar  / Losartan . I confirmed that it is the same drug formulary as originally prescribed by Amy.  I confirmed that the patient does have a Losartan  Potassium 100 mg prescription and a separate Potassium Chloride  10 meq prescription. The reason the patient may have thought she received them combined could be because even though Amy did refill both medications on 11/14/24, the potassium chloride  is not due for refill until 12/06/24. So, she did not receive a refill of potassium with this shipment of medications - possibly causing the confusion.   Ended the call by confirming pharmacy has all 3 correct prescriptions on file and that anything they dispense to the patient is drug-equivalent to what Amy has prescribed.   Patient left the office to run errands, but I will advise her when she returns.

## 2024-12-18 ENCOUNTER — Encounter: Attending: Registered Nurse | Admitting: Registered Nurse

## 2024-12-19 ENCOUNTER — Other Ambulatory Visit: Payer: Self-pay | Admitting: Family

## 2024-12-19 DIAGNOSIS — G8929 Other chronic pain: Secondary | ICD-10-CM

## 2024-12-19 NOTE — Telephone Encounter (Unsigned)
 Copied from CRM [id number]. Topic: Clinical - Medication Refill >> Dec 19, 2024 10:02 AM Tinnie C wrote: Medication: diclofenac  (VOLTAREN ) 75 MG EC tablet *Pt completely out of medication and will need to wait for delivery as well, please expedite if possible*  Has the patient contacted their pharmacy? Yes They said they would request refill, but no refill request received.   This is the patient's preferred pharmacy:  [facility] [place], [place] - [address] [place] [address] Phone: [phone] Fax: [phone]  Is this the correct pharmacy for this prescription? Yes If no, delete pharmacy and type the correct one.   Has the prescription been filled recently? Yes  Is the patient out of the medication? Yes  Has the patient been seen for an appointment in the last year OR does the patient have an upcoming appointment? Yes  Can we respond through MyChart? Please call [phone]  Agent: Please be advised that Rx refills may take up to 3 business days. We ask that you follow-up with your pharmacy.

## 2024-12-20 MED ORDER — DICLOFENAC SODIUM 75 MG PO TBEC: 75.0000 mg | DELAYED_RELEASE_TABLET | Freq: Two times a day (BID) | ORAL | 0 refills | Status: AC

## 2024-12-20 NOTE — Telephone Encounter (Signed)
 Requested Prescriptions  Pending Prescriptions Disp Refills   diclofenac  (VOLTAREN ) 75 MG EC tablet 180 tablet 0    Sig: Take 1 tablet (75 mg total) by mouth 2 (two) times daily.     Analgesics:  NSAIDS Failed - 12/20/2024  4:15 PM      Failed - Manual Review: Labs are only required if the patient has taken medication for more than 8 weeks.      Failed - HGB in normal range and within 360 days    Hemoglobin  Date Value Ref Range Status  03/27/2024 10.3 (L) 11.1 - 15.9 g/dL Final         Passed - Cr in normal range and within 360 days    Creat  Date Value Ref Range Status  03/10/2022 0.63 0.60 - 1.00 mg/dL Final   Creatinine, Ser  Date Value Ref Range Status  11/14/2024 0.64 0.57 - 1.00 mg/dL Final         Passed - PLT in normal range and within 360 days    Platelets  Date Value Ref Range Status  03/27/2024 319 150 - 450 x10E3/uL Final         Passed - HCT in normal range and within 360 days    Hematocrit  Date Value Ref Range Status  03/27/2024 34.0 34.0 - 46.6 % Final         Passed - eGFR is 30 or above and within 360 days    GFR, Est African American  Date Value Ref Range Status  01/21/2021 102 > OR = 60 mL/min/1.34m2 Final   GFR, Est Non African American  Date Value Ref Range Status  01/21/2021 88 > OR = 60 mL/min/1.36m2 Final   GFR, Estimated  Date Value Ref Range Status  03/16/2022 >60 >60 mL/min Final    Comment:    (NOTE) Calculated using the CKD-EPI Creatinine Equation (2021)    eGFR  Date Value Ref Range Status  11/14/2024 93 >59 mL/min/1.73 Final         Passed - Patient is not pregnant      Passed - Valid encounter within last 12 months    Recent Outpatient Visits           1 month ago Primary hypertension   [facility], [place], NP   4 months ago Ankle edema, bilateral   [facility], Amy J, NP   5 months ago Primary hypertension   [facility], Amy J, NP   6 months ago Sinusitis, unspecified chronicity, unspecified location   [facility], Amy J, NP   7 months ago Primary hypertension   [place] [facility], Greig PARAS, NP

## 2024-12-21 ENCOUNTER — Encounter: Payer: Self-pay | Admitting: Registered Nurse

## 2024-12-21 ENCOUNTER — Encounter: Attending: Registered Nurse | Admitting: Registered Nurse

## 2024-12-21 VITALS — BP 135/83 | HR 81 | Ht 60.5 in | Wt 209.0 lb

## 2024-12-21 DIAGNOSIS — G8929 Other chronic pain: Secondary | ICD-10-CM | POA: Diagnosis present

## 2024-12-21 DIAGNOSIS — M961 Postlaminectomy syndrome, not elsewhere classified: Secondary | ICD-10-CM | POA: Insufficient documentation

## 2024-12-21 DIAGNOSIS — M1712 Unilateral primary osteoarthritis, left knee: Secondary | ICD-10-CM | POA: Diagnosis present

## 2024-12-21 DIAGNOSIS — M1711 Unilateral primary osteoarthritis, right knee: Secondary | ICD-10-CM

## 2024-12-21 DIAGNOSIS — M5416 Radiculopathy, lumbar region: Secondary | ICD-10-CM | POA: Insufficient documentation

## 2024-12-21 DIAGNOSIS — Z5181 Encounter for therapeutic drug level monitoring: Secondary | ICD-10-CM | POA: Diagnosis not present

## 2024-12-21 DIAGNOSIS — Z79891 Long term (current) use of opiate analgesic: Secondary | ICD-10-CM | POA: Diagnosis not present

## 2024-12-21 DIAGNOSIS — M545 Low back pain, unspecified: Secondary | ICD-10-CM | POA: Insufficient documentation

## 2024-12-21 DIAGNOSIS — G894 Chronic pain syndrome: Secondary | ICD-10-CM | POA: Insufficient documentation

## 2024-12-21 MED ORDER — OXYCODONE-ACETAMINOPHEN 7.5-325 MG PO TABS: 1.0000 | ORAL_TABLET | Freq: Four times a day (QID) | ORAL | 0 refills | Status: AC | PRN

## 2024-12-21 NOTE — Progress Notes (Signed)
 "  Subjective:    Patient ID: Hayley Padilla, female    DOB: 01-05-1951, 74 y.o.   MRN: [id number]  HPI: Hayley Padilla is a 74 y.o. female who returns for follow up appointment for chronic pain and medication refill. She states her pain is located in her lower back radiating into her buttocks and bilateral knee pain. She  rates her pain 10. Her current exercise regime is walking and performing stretching exercises.  Hayley Padilla Morphine  equivalent is 45.00 MME.   Last Oral Swab was Performed on 11/13/2025, it was consistent.     Pain Inventory Average Pain 6 Pain Right Now 10 My pain is constant and aching  In the last 24 hours, has pain interfered with the following? General activity 9 Relation with others 9 Enjoyment of life 0 What TIME of day is your pain at its worst? daytime Sleep (in general) NA  Pain is worse with: walking, bending, standing, and some activites Pain improves with: rest and medication Relief from Meds: 2  Family History  Problem Relation Age of Onset   Colon cancer Neg Hx    Colon polyps Neg Hx    Esophageal cancer Neg Hx    Rectal cancer Neg Hx    Stomach cancer Neg Hx    Breast cancer Neg Hx    Social History   Socioeconomic History   Marital status: Widowed    Spouse name: Not on file   Number of children: Not on file   Years of education: Not on file   Highest education level: Not on file  Occupational History   Not on file  Tobacco Use   Smoking status: Never   Smokeless tobacco: Current    Types: Chew   Tobacco comments:    last used 03/17/2020 @ 2200  Vaping Use   Vaping status: Never Used  Substance and Sexual Activity   Alcohol use: Not Currently   Drug use: Never   Sexual activity: Not Currently  Other Topics Concern   Not on file  Social History Narrative   Not on file   Social Drivers of Health   Tobacco Use: High Risk (12/21/2024)   Patient History    Smoking Tobacco Use: Never    Smokeless Tobacco Use: Current    Passive  Exposure: Not on file  Financial Resource Strain: Low Risk (03/27/2024)   Overall Financial Resource Strain (CARDIA)    Difficulty of Paying Living Expenses: Not hard at all  Food Insecurity: No Food Insecurity (03/27/2024)   Hunger Vital Sign    Worried About Running Out of Food in the Last Year: Never true    Ran Out of Food in the Last Year: Never true  Transportation Needs: No Transportation Needs (03/27/2024)   PRAPARE - Administrator, Civil Service (Medical): No    Lack of Transportation (Non-Medical): No  Physical Activity: Inactive (03/27/2024)   Exercise Vital Sign    Days of Exercise per Week: 0 days    Minutes of Exercise per Session: 0 min  Stress: No Stress Concern Present (03/27/2024)   [employer] of Occupational Health - Occupational Stress Questionnaire    Feeling of Stress : Only a little  Social Connections: Moderately Isolated (03/27/2024)   Social Connection and Isolation Panel    Frequency of Communication with Friends and Family: More than three times a week    Frequency of Social Gatherings with Friends and Family: Twice a week    Attends Religious  Services: More than 4 times per year    Active Member of Clubs or Organizations: No    Attends Banker Meetings: Never    Marital Status: Widowed  Depression (PHQ2-9): Low Risk (12/21/2024)   Depression (PHQ2-9)    PHQ-2 Score: 0  Recent Concern: Depression (PHQ2-9) - High Risk (11/14/2024)   Depression (PHQ2-9)    PHQ-2 Score: 11  Alcohol Screen: Low Risk (03/27/2024)   Alcohol Screen    Last Alcohol Screening Score (AUDIT): 2  Housing: Unknown (03/27/2024)   Housing Stability Vital Sign    Unable to Pay for Housing in the Last Year: No    Number of Times Moved in the Last Year: Not on file    Homeless in the Last Year: No  Utilities: Not At Risk (03/27/2024)   AHC Utilities    Threatened with loss of utilities: No  Health Literacy: Adequate Health Literacy (03/27/2024)   B1300 Health  Literacy    Frequency of need for help with medical instructions: Never   Past Surgical History:  Procedure Laterality Date   BACK SURGERY     lower back   IR LUMBAR DISC ASPIRATION W/IMG GUIDE  01/15/2022   LAMINECTOMY WITH POSTERIOR LATERAL ARTHRODESIS LEVEL 1 N/A 03/16/2022   Procedure: Lumbar One-Two Posterior Lateral Fusion with pedicle screws;  Surgeon: Louis Shove, MD;  Location[facility];  Service: Neurosurgery;  Laterality: N/A;   MULTIPLE TOOTH EXTRACTIONS     Past Surgical History:  Procedure Laterality Date   BACK SURGERY     lower back   IR LUMBAR DISC ASPIRATION W/IMG GUIDE  01/15/2022   LAMINECTOMY WITH POSTERIOR LATERAL ARTHRODESIS LEVEL 1 N/A 03/16/2022   Procedure: Lumbar One-Two Posterior Lateral Fusion with pedicle screws;  Surgeon: Louis Shove, MD;  Location: [facility] OR;  Service: Neurosurgery;  Laterality: N/A;   MULTIPLE TOOTH EXTRACTIONS     Past Medical History:  Diagnosis Date   Allergy    Arthritis    Diabetes mellitus without complication (HCC)    Hyperlipidemia    Hypertension    PONV (postoperative nausea and vomiting)    BP 135/83   Pulse 81   Ht 5' 0.5 (1.537 m)   Wt 209 lb (94.8 kg)   SpO2 95%   BMI 40.15 kg/m   Opioid Risk Score:   Fall Risk Score:  `1  Depression screen [facility] 2/9     12/21/2024    2:44 PM 11/14/2024    2:57 PM 09/11/2024    2:48 PM 08/04/2024    9:36 AM 07/07/2024    3:09 PM 06/06/2024    3:14 PM 03/27/2024    1:24 PM  Depression screen PHQ 2/9  Decreased Interest 0 0 0 1 0 0 0  Down, Depressed, Hopeless 0 2 0 1 0 2 0  PHQ - 2 Score 0 2 0 2 0 2 0  Altered sleeping  0    0   Tired, decreased energy  3    0   Change in appetite  2    1   Feeling bad or failure about yourself   3    0   Trouble concentrating  0    0   Moving slowly or fidgety/restless  1    1   Suicidal thoughts  0    0   PHQ-9 Score  11    4    Difficult doing work/chores    Somewhat difficult  Somewhat difficult  Data saved with a previous flowsheet row  definition     Review of Systems  Musculoskeletal:  Positive for back pain.  All other systems reviewed and are negative.      Objective:   Physical Exam Vitals and nursing note reviewed.  Constitutional:      Appearance: Normal appearance.  Cardiovascular:     Rate and Rhythm: Normal rate and regular rhythm.     Pulses: Normal pulses.     Heart sounds: Normal heart sounds.  Pulmonary:     Effort: Pulmonary effort is normal.     Breath sounds: Normal breath sounds.  Musculoskeletal:     Comments: Normal Muscle Bulk and Muscle Testing Reveals:  Upper Extremities:Full  ROM and Muscle Strength 5/5  Lumbar Paraspinal Tenderness: L-3-L-5 Lower Extremities: Full ROM and Muscle Strength 5/5 Arises from Table slowly using walker for support Antalgic Gait     Skin:    General: Skin is warm and dry.  Neurological:     Mental Status: She is alert and oriented to person, place, and time.  Psychiatric:        Mood and Affect: Mood normal.        Behavior: Behavior normal.          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 Henkes underwent on 03/17/2023 : Dr Louis : Lumbar One-Two Posterior Lateral Fusion with pedicle screws Continue HEP as Tolerated. Continue to Monitor. 12/21/2024 2. Lumbar Radiculitis: Continue Gabapentin . Continue to monitor. 12/21/2024 3. Chronic Bilateral Hip Pain: No complaints today. Continue HEP as Tolerated. Continue to Monitor. 12/21/2024 4. Chronic Pain of Right Knee Pain: Continue HEP as Tolerated. Continue to Monitor. 12/21/2024 5. Chronic Pain Syndrome: Refilled: Oxycodone  7.5/325 mg one tablet 4 times a day as needed for pain #120.  We will continue the opioid monitoring program, this consists of regular clinic visits, examinations, urine drug screen, pill counts as well as use of [place]  Controlled Substance Reporting system. A 12 month History has been reviewed on the [place]  Controlled  Substance Reporting System on 01/081/2026. 6, Left Shoulder pain: No complaints today. Continue HEP as Tolerated. Continue to monitor.  12/21/2024 7. Bilateral Ankle Pain: No complaints today. Continue HEP as Tolerated. Continue to Monitor. 12/21/2024 F/U in 1 month    "

## 2024-12-29 ENCOUNTER — Other Ambulatory Visit: Payer: Self-pay

## 2024-12-29 ENCOUNTER — Encounter (HOSPITAL_BASED_OUTPATIENT_CLINIC_OR_DEPARTMENT_OTHER): Payer: Self-pay | Admitting: Otolaryngology

## 2025-01-01 ENCOUNTER — Encounter (HOSPITAL_BASED_OUTPATIENT_CLINIC_OR_DEPARTMENT_OTHER)
Admission: RE | Admit: 2025-01-01 | Discharge: 2025-01-01 | Disposition: A | Source: Ambulatory Visit | Attending: Otolaryngology

## 2025-01-01 DIAGNOSIS — Z01810 Encounter for preprocedural cardiovascular examination: Secondary | ICD-10-CM | POA: Diagnosis present

## 2025-01-01 DIAGNOSIS — Z01812 Encounter for preprocedural laboratory examination: Secondary | ICD-10-CM | POA: Diagnosis present

## 2025-01-01 DIAGNOSIS — Z01818 Encounter for other preprocedural examination: Secondary | ICD-10-CM | POA: Insufficient documentation

## 2025-01-01 LAB — BASIC METABOLIC PANEL WITH GFR
Anion gap: 11 (ref 5–15)
BUN: 10 mg/dL (ref 8–23)
CO2: 27 mmol/L (ref 22–32)
Calcium: 9.1 mg/dL (ref 8.9–10.3)
Chloride: 104 mmol/L (ref 98–111)
Creatinine, Ser: 0.66 mg/dL (ref 0.44–1.00)
GFR, Estimated: >60 mL/min
Glucose, Bld: 135 mg/dL — ABNORMAL HIGH (ref 70–99)
Potassium: 4 mmol/L (ref 3.5–5.1)
Sodium: 143 mmol/L (ref 135–145)

## 2025-01-04 NOTE — Anesthesia Preprocedure Evaluation (Signed)
"                                    Anesthesia Evaluation    Reviewed: Allergy & Precautions, Patient's Chart, lab work & pertinent test results  History of Anesthesia Complications (+) PONV and history of anesthetic complications  Airway        Dental   Pulmonary neg pulmonary ROS          Cardiovascular hypertension, Pt. on medications + Valvular Problems/Murmurs (mild MR) MR   Echo 2023  1. Left ventricular ejection fraction, by estimation, is 65 to 70%. The  left ventricle has normal function. The left ventricle has no regional  wall motion abnormalities. Left ventricular diastolic parameters were  normal.   2. Right ventricular systolic function is normal. The right ventricular  size is normal. There is normal pulmonary artery systolic pressure.   3. Mild mitral valve regurgitation.   4. The aortic valve is normal in structure. Aortic valve regurgitation is  not visualized.   5. The inferior vena cava is normal in size with greater than 50%  respiratory variability, suggesting right atrial pressure of 3 mmHg.     Neuro/Psych negative neurological ROS  negative psych ROS   GI/Hepatic negative GI ROS, Neg liver ROS,,,  Endo/Other  diabetes, Well Controlled, Type 2, Oral Hypoglycemic Agents  Obesity BMI 38  Renal/GU negative Renal ROS  negative genitourinary   Musculoskeletal  (+) Arthritis , Osteoarthritis,    Abdominal  (+) + obese  Peds  Hematology negative hematology ROS (+)   Anesthesia Other Findings   Reproductive/Obstetrics negative OB ROS                              Anesthesia Physical Anesthesia Plan  ASA: 2  Anesthesia Plan: General   Post-op Pain Management: Tylenol  PO (pre-op)*   Induction: Intravenous  PONV Risk Score and Plan: 4 or greater and Ondansetron , Dexamethasone , Propofol  infusion, TIVA, Midazolam  and Treatment may vary due to age or medical condition  Airway Management Planned: Oral  ETT  Additional Equipment: None  Intra-op Plan:   Post-operative Plan: Extubation in OR  Informed Consent:   Plan Discussed with:   Anesthesia Plan Comments:          Anesthesia Quick Evaluation  "

## 2025-01-05 ENCOUNTER — Telehealth (INDEPENDENT_AMBULATORY_CARE_PROVIDER_SITE_OTHER): Payer: Self-pay

## 2025-01-05 NOTE — Telephone Encounter (Signed)
 Patient called concerning her surgery on Monday 11/08/2025. Patient wanted to know what to do with the upcoming bad weather. I let her know that if the surgery center is not open they will call and let her know. I also let her know that if they are open but she cannot make it that she can call us  and reschedule. Patient understood.

## 2025-01-08 ENCOUNTER — Ambulatory Visit (HOSPITAL_BASED_OUTPATIENT_CLINIC_OR_DEPARTMENT_OTHER): Admission: RE | Admit: 2025-01-08 | Admitting: Otolaryngology

## 2025-01-08 DIAGNOSIS — Z01818 Encounter for other preprocedural examination: Secondary | ICD-10-CM

## 2025-01-10 NOTE — Progress Notes (Signed)
 Hayley Padilla                                          MRN: [id number]   01/10/2025   The [facility] Quality Team Specialist reviewed this patient medical record for the purposes of chart review for care gap closure. The following were reviewed: chart review for care gap closure-controlling blood pressure.    [facility]I Quality Team

## 2025-01-11 ENCOUNTER — Encounter (INDEPENDENT_AMBULATORY_CARE_PROVIDER_SITE_OTHER): Admitting: Otolaryngology

## 2025-01-18 ENCOUNTER — Other Ambulatory Visit: Payer: Self-pay | Admitting: Family

## 2025-01-18 DIAGNOSIS — I10 Essential (primary) hypertension: Secondary | ICD-10-CM

## 2025-01-18 NOTE — Telephone Encounter (Unsigned)
 Copied from CRM #[id number]. Topic: Clinical - Medication Refill >> Jan 18, 2025  1:37 PM Tobias L wrote: Medication: amlodipine  10mg  tablet  Has the patient contacted their pharmacy? Yes [facility] pharmacy calling on behalf of patient to have prescription transferred to pharmacy below.   This is the patient's preferred pharmacy:  [facility] [address], [place] - [address] [place] [address] Phone: [phone] Fax: [phone]  Is this the correct pharmacy for this prescription? Yes   Has the prescription been filled recently? No  Is the patient out of the medication? No, only has a two day supply.   Has the patient been seen for an appointment in the last year OR does the patient have an upcoming appointment? Yes  Can we respond through MyChart? Yes  Agent: Please be advised that Rx refills may take up to 3 business days. We ask that you follow-up with your pharmacy.

## 2025-01-19 ENCOUNTER — Other Ambulatory Visit: Payer: Self-pay

## 2025-01-19 ENCOUNTER — Encounter (HOSPITAL_BASED_OUTPATIENT_CLINIC_OR_DEPARTMENT_OTHER): Payer: Self-pay | Admitting: Otolaryngology

## 2025-01-29 ENCOUNTER — Encounter (HOSPITAL_BASED_OUTPATIENT_CLINIC_OR_DEPARTMENT_OTHER): Admission: RE | Payer: Self-pay | Source: Home / Self Care

## 2025-01-29 ENCOUNTER — Encounter (HOSPITAL_BASED_OUTPATIENT_CLINIC_OR_DEPARTMENT_OTHER): Payer: Self-pay | Admitting: Anesthesiology

## 2025-01-29 DIAGNOSIS — Z01818 Encounter for other preprocedural examination: Secondary | ICD-10-CM

## 2025-02-01 ENCOUNTER — Encounter (INDEPENDENT_AMBULATORY_CARE_PROVIDER_SITE_OTHER): Admitting: Otolaryngology

## 2025-02-08 ENCOUNTER — Encounter: Admitting: Registered Nurse

## 2025-02-12 ENCOUNTER — Ambulatory Visit: Admitting: Family

## 2025-03-27 ENCOUNTER — Encounter: Admitting: Family
